# Patient Record
Sex: Female | Born: 1935
Health system: Southern US, Community
[De-identification: ages and names within clinical notes are randomized; demographics above are authoritative.]

## PROBLEM LIST (undated history)

## (undated) DIAGNOSIS — D62 Acute posthemorrhagic anemia: Secondary | ICD-10-CM

## (undated) DIAGNOSIS — M1990 Unspecified osteoarthritis, unspecified site: Secondary | ICD-10-CM

## (undated) DIAGNOSIS — K219 Gastro-esophageal reflux disease without esophagitis: Secondary | ICD-10-CM

## (undated) DIAGNOSIS — K922 Gastrointestinal hemorrhage, unspecified: Secondary | ICD-10-CM

## (undated) DIAGNOSIS — I10 Essential (primary) hypertension: Secondary | ICD-10-CM

## (undated) DIAGNOSIS — K5792 Diverticulitis of intestine, part unspecified, without perforation or abscess without bleeding: Secondary | ICD-10-CM

## (undated) HISTORY — DX: Acute posthemorrhagic anemia: D62

## (undated) HISTORY — DX: Unspecified osteoarthritis, unspecified site: M19.90

## (undated) HISTORY — DX: Essential (primary) hypertension: I10

## (undated) HISTORY — PX: ABDOMINAL HYSTERECTOMY: SHX81

## (undated) HISTORY — DX: Gastro-esophageal reflux disease without esophagitis: K21.9

---

## 2003-07-10 ENCOUNTER — Encounter: Admission: RE | Admit: 2003-07-10 | Discharge: 2003-07-10 | Payer: Self-pay

## 2004-11-11 ENCOUNTER — Encounter: Admission: RE | Admit: 2004-11-11 | Discharge: 2004-11-11 | Payer: Self-pay

## 2005-09-13 ENCOUNTER — Ambulatory Visit: Payer: Self-pay | Admitting: Gastroenterology

## 2005-09-13 ENCOUNTER — Inpatient Hospital Stay (HOSPITAL_COMMUNITY): Admission: EM | Admit: 2005-09-13 | Discharge: 2005-09-18 | Payer: Self-pay | Admitting: Emergency Medicine

## 2005-09-23 ENCOUNTER — Ambulatory Visit: Payer: Self-pay | Admitting: Gastroenterology

## 2005-10-21 ENCOUNTER — Other Ambulatory Visit: Admission: RE | Admit: 2005-10-21 | Discharge: 2005-10-21 | Payer: Self-pay | Admitting: *Deleted

## 2006-01-26 ENCOUNTER — Encounter: Admission: RE | Admit: 2006-01-26 | Discharge: 2006-01-26 | Payer: Self-pay | Admitting: *Deleted

## 2006-03-27 ENCOUNTER — Ambulatory Visit (HOSPITAL_COMMUNITY): Admission: RE | Admit: 2006-03-27 | Discharge: 2006-03-27 | Payer: Self-pay | Admitting: Gastroenterology

## 2006-04-10 ENCOUNTER — Ambulatory Visit: Payer: Self-pay | Admitting: Gastroenterology

## 2007-02-05 ENCOUNTER — Encounter: Admission: RE | Admit: 2007-02-05 | Discharge: 2007-02-05 | Payer: Self-pay | Admitting: *Deleted

## 2009-06-20 ENCOUNTER — Inpatient Hospital Stay (HOSPITAL_COMMUNITY): Admission: EM | Admit: 2009-06-20 | Discharge: 2009-06-26 | Payer: Self-pay | Admitting: Emergency Medicine

## 2009-07-05 ENCOUNTER — Ambulatory Visit (HOSPITAL_COMMUNITY): Admission: RE | Admit: 2009-07-05 | Discharge: 2009-07-05 | Payer: Self-pay | Admitting: General Surgery

## 2010-05-10 ENCOUNTER — Encounter: Admission: RE | Admit: 2010-05-10 | Discharge: 2010-05-10 | Payer: Self-pay | Admitting: Physician Assistant

## 2010-07-28 ENCOUNTER — Encounter: Payer: Self-pay | Admitting: General Surgery

## 2010-10-07 LAB — ANAEROBIC CULTURE

## 2010-10-07 LAB — CULTURE, ROUTINE-ABSCESS

## 2010-10-07 LAB — BASIC METABOLIC PANEL
BUN: 5 mg/dL — ABNORMAL LOW (ref 6–23)
Calcium: 9.2 mg/dL (ref 8.4–10.5)
Creatinine, Ser: 0.71 mg/dL (ref 0.4–1.2)
GFR calc non Af Amer: >60 mL/min (ref >60)
Glucose, Bld: 113 mg/dL — ABNORMAL HIGH (ref 70–99)

## 2010-10-07 LAB — CBC
Platelets: 358 10*3/uL (ref 150–400)
RDW: 14.8 % (ref 11.5–15.5)
WBC: 9.9 10*3/uL (ref 4.0–10.5)

## 2010-10-08 LAB — URINE MICROSCOPIC-ADD ON

## 2010-10-08 LAB — LIPASE, BLOOD: Lipase: 10 U/L — ABNORMAL LOW (ref 11–59)

## 2010-10-08 LAB — COMPREHENSIVE METABOLIC PANEL
BUN: 8 mg/dL (ref 6–23)
CO2: 30 mEq/L (ref 19–32)
Calcium: 10 mg/dL (ref 8.4–10.5)
Creatinine, Ser: 0.71 mg/dL (ref 0.4–1.2)
GFR calc non Af Amer: >60 mL/min (ref >60)
Glucose, Bld: 106 mg/dL — ABNORMAL HIGH (ref 70–99)
Total Protein: 7.3 g/dL (ref 6.0–8.3)

## 2010-10-08 LAB — URINE CULTURE

## 2010-10-08 LAB — POCT CARDIAC MARKERS
CKMB, poc: 3.1 ng/mL (ref 1.0–8.0)
Myoglobin, poc: 151 ng/mL (ref 12–200)
Myoglobin, poc: 174 ng/mL (ref 12–200)

## 2010-10-08 LAB — CBC
Hemoglobin: 12.3 g/dL (ref 12.0–15.0)
MCHC: 33.4 g/dL (ref 30.0–36.0)
MCV: 74.9 fL — ABNORMAL LOW (ref 78.0–100.0)
RBC: 4.91 MIL/uL (ref 3.87–5.11)
RDW: 15.1 % (ref 11.5–15.5)

## 2010-10-08 LAB — DIFFERENTIAL
Eosinophils Absolute: 0 10*3/uL (ref 0.0–0.7)
Lymphocytes Relative: 9 % — ABNORMAL LOW (ref 12–46)
Lymphs Abs: 1.2 10*3/uL (ref 0.7–4.0)
Monocytes Relative: 5 % (ref 3–12)
Neutro Abs: 11.8 10*3/uL — ABNORMAL HIGH (ref 1.7–7.7)
Neutrophils Relative %: 86 % — ABNORMAL HIGH (ref 43–77)

## 2010-10-08 LAB — URINALYSIS, ROUTINE W REFLEX MICROSCOPIC
Glucose, UA: NEGATIVE mg/dL
Nitrite: NEGATIVE
Protein, ur: 30 mg/dL — AB
pH: 6 (ref 5.0–8.0)

## 2010-10-08 LAB — PROTIME-INR: Prothrombin Time: 13.9 seconds (ref 11.6–15.2)

## 2010-10-08 LAB — APTT: aPTT: 34 s (ref 24–37)

## 2010-11-22 NOTE — Discharge Summary (Signed)
NAMEMarland Kitchen  Latoya Bennett, Latoya Bennett       ACCOUNT NO.:  [id number]   MEDICAL RECORD NO.:  [id number]          PATIENT TYPE:  INP   LOCATION:  4734                         FACILITY:  [facility]   PHYSICIAN:  Melissa L. Ladona Ridgel, MD  DATE OF BIRTH:  [birth year]   DATE OF ADMISSION:  09/13/2005  DATE OF DISCHARGE:  09/18/2005                                 DISCHARGE SUMMARY   ADDENDUM:  Please see previously dictated discharge summary by Dr. Jackie Plum and note the following addendum.   The patient continued to be monitored in the hospital because of the  potential for further GI bleeding.  Her hemoglobin remained stable at 10.1;  however, she developed the onset of acute tachycardia and hypertension and  therefore I requested that she stay for further monitoring.  Her Norvasc was  resumed and Lopressor was added at 25 mg p.o. twice daily.  A 2-D echo was  ordered and it was explained to the patient the reason for the test and why  it was important to obtain.  A TSH was also ordered and her potassium was  repleted.  A D-dimer was obtained and the thought was entertained at  obtaining a CTA of her chest if it were elevated.  On September 19, 2005, the  patient elected to -- against my advice -- leave the hospital, despite  explaining to her the need to further pursue her hypertension and her  tachycardia.  She stated that she could obtain assistance as an outpatient  and did not wish to further be tested.  Her sister was present during the  conversation and they attempted to explain to her the life-threatening  nature of her illness to which she said, 'I'll just return to the hospital  if there's a problem.  In an effort to maintain her health, I did provide  her with a prescription for Lopressor 25 mg twice daily and she already had  a prescription for Norvasc 5 mg once daily.  The patient was asked to sign  an against-medical-advice form, as I felt it was important to elucidate the  cause behind  her hypertension and tachycardia.      Melissa L. Ladona Ridgel, MD  Electronically Signed     MLT/MEDQ  D:  09/23/2005  T:  09/25/2005  Job:  [id number]

## 2010-11-22 NOTE — Discharge Summary (Signed)
NAMEMarland Bennett  ALTOVISE, WAHLER       ACCOUNT NO.:  [id number]   MEDICAL RECORD NO.:  [id number]          PATIENT TYPE:  INP   LOCATION:  3311                         FACILITY:  [facility]   PHYSICIAN:  Jackie Plum, M.D.DATE OF BIRTH:  1935/10/24   DATE OF ADMISSION:  09/13/2005  DATE OF DISCHARGE:  09/16/2005                                 DISCHARGE SUMMARY   DISCHARGE DIAGNOSES:  1.  Lower gastrointestinal bleed, status post colonoscopy today by Dr.      Christella Hartigan, which showed diverticula without any specific one seen to be      bleeding.  The cause of gastrointestinal bleed is presumed to be      secondary to the diverticular bleed, in view of the fact that this was      mainly painless bleeding and patient has history of previous      diverticular bleed.  2.  Anemia of acute blood loss, status post packed red blood cells      transfusion, stable hemoglobin.  3.  History of hypertension.  4.  Diverticulosis.   DISCHARGE MEDICATIONS:  To be clarified at the final time of discharge by  Dr. Derenda Mis.   CONDITION ON DISCHARGE:  Given at the time of final discharge by Dr. Ladona Ridgel.   CONSULTATIONS:  Anselmo Rod, M.D. and Rachael Fee, M.D. of GI  surgery.   PROCEDURE:  Colonoscopy, done on September 15, 2005.   REASON FOR ADMISSION:  Hematochezia.   Patient presented with painless hematochezia.  She had severe anemia of 6.2  on presentation.  She has had previous diverticular bleed about two years  ago.  On admission, the patient's BP was 105/50 with a pulse of 120.  She  was acutely pale and her cardiopulmonary auscultation was notable for  tachycardia without any gallops or murmur.  Abdomen is soft.  She is  therefore admitted for further evaluation to the telemetry bed and  monitoring.  The patient does not have any significant dysrhythmias.  She  was seen in consultation by Dr. Loreta Ave, who agreed that the patient's bleed  may be diverticular in origin; however, will  need to rule out any GI causes  of bleeding.  She had a colonoscopy done today which has shown the above  results, as noted above.  The patient __________ result.  Her hemoglobin has  been stabilized since __________ without any evidence of ongoing GI blood  loss.  She has not had any further hematochezia.  She does not have any  dizziness or any other symptoms at this time.  GI medicine has recommended  __________ over the next 24 hours, and she should be fine, can be discharged  home, otherwise, if she should have any evidence of ongoing blood loss, then  she will need nuclear study done at the time.  This dictation is therefore  being done in anticipation of discharge home tomorrow, when she will be seen  and followed by my colleague, Derenda Mis, M.D.   On rounds today, patient denied dizziness, shortness of breath, chest pain,  abdominal pain, nausea or vomiting.   PHYSICAL EXAMINATION:  CARDIOPULMONARY:  Her  cardiopulmonary auscultation  was unremarkable.  ABDOMEN:  Soft and nontender.  Bowel sounds are present.  EXTREMITIES:  No cyanosis.   Her labs indicate a WBC count of 5.8, hemoglobin 10.1, hematocrit 29.5, MCV  82.9, platelet count 184.  Patient's sodium is 146, potassium 3.6, chloride  105, CO2 27, glucose 108, BUN less than 1, creatinine 0.7.  Calcium is 8.9.   She is tolerating clears.  We are going to decrease her fluids and transfer  her to a telemetry bed for the next 24 hours for monitoring, as listed  above.      Jackie Plum, M.D.  Electronically Signed     GO/MEDQ  D:  09/15/2005  T:  09/15/2005  Job:  [id number]

## 2010-11-22 NOTE — Assessment & Plan Note (Signed)
Latoya Bennett                           Latoya Bennett   MCKENLEIGH, TARLTON                MRN:          [id number]  DATE:04/10/2006                            DOB:          [birth year]    PRIMARY CARE PHYSICIAN:  Dr. Dani Gobble at [facility].   GI PROBLEM LIST:  1. Recurrent lower gastrointestinal bleeding, likely diverticular.  Three      bleeds since 2003, [place].  She was told there      were diverticular bleeds in the past.  Most recent bleed was March      2007, painless hematochezia requiring one to two units of blood.      Colonoscopy performed by myself March 2007 showed multiple large      diverticula throughout the colon.  2. Acute sigmoid diverticulitis September 2007.  Presented with pain, mild      elevation of white count.  CT scan confirmed sigmoid diverticulitis.      Responded to oral antibiotics.   INTERVAL HISTORY:  I last saw Latoya Bennett seven to eight months ago.  She came to our office two weeks ago without an appointment and I was,  unfortunately, not available at that time.  Clinically, she had left lower  quadrant pain.  She was arranged to have a CT scan that day that confirmed  some sigmoid diverticulitis.  We prescribed her Cipro and Flagyl and she did  take this although sporadically.  She said she did complete most of the  antibiotic regimen.  For the past week-and-a-half to two weeks, she has felt  much better. Her pain is gone. She has had no fevers. Her bowels are back to  normal.   CURRENT MEDICATIONS:  Norvasc, Lopressor.   PHYSICAL EXAMINATION:  VITAL SIGNS:  Weight 155 pounds,  blood pressure  l50/82, pulse 88.  CONSTITUTIONAL:  Generally well-appearing.  CARDIOVASCULAR:  Regular rate and rhythm.  LUNGS:  Clear to auscultation bilaterally.  ABDOMEN:  Soft, nontender, nondistended.  Normal bowel sounds.   ASSESSMENT/PLAN:  A 75 year old woman with  recent diverticulitis, history of  diverticular bleeds.   I explained to Latoya Bennett that she has had several complications from  diverticular disease and that it is likely that she will continue to have  complications.  I recommended that she see a surgeon to discuss segmental  colectomy.  She is not very interested in that at this point.  Frankly, she  is downplaying her disease and she is not really sure that the antibiotics  made a difference.  She thinks just that modifying her diet during repeat  courses is probably all that she will need.  She, therefore, declines  surgical referral and we will instead just simply follow her clinically.  I  think she did agree that if she has another complication, she will  reconsider at that  point.  I see no reason for any further lab tests or imaging studies.            ______________________________  Rachael Fee, MD      DPJ/MedQ  DD:  04/10/2006  DT:  04/12/2006  Job #:  [id number]   cc:   Dr. Dani Gobble, [facility]

## 2010-11-22 NOTE — H&P (Signed)
NAMEMarland Kitchen  Latoya Bennett, Latoya Bennett       ACCOUNT NO.:  [id number]   MEDICAL RECORD NO.:  [id number]          PATIENT TYPE:  EMS   LOCATION:  MAJO                         FACILITY:  [facility]   PHYSICIAN:  Jackie Plum, M.D.DATE OF BIRTH:  Sep 18, 1935   DATE OF ADMISSION:  09/13/2005  DATE OF DISCHARGE:                                HISTORY & PHYSICAL   CHIEF COMPLAINT:  Hematochezia.   HISTORY OF PRESENT ILLNESS:  The patient presents with a 5-day history of  rectal bleeding which has been intermittent. She is a 75 year old lady with  history of hypertension and atherosclerotic disease, status post colonoscopy  after a prior GI bleed about 2 years ago at which time she was found to have  diverticular disease. She indicated that she is having worsening rectal  bleed with mild left lower quadrant abdominal discomfort. She had been  feeling dizzy upon standing. No chest pain, no shortness of breath. No PND,  orthopnea. No skin rash or bleeding. No mucosal membrane bleeding noted. No  difference in micturition. The patient is noted to be severely anemic. After  blood transfusion was started, we were asked to admit for admission.   REVIEW OF SYSTEMS:  As noted above. Cardiovascular - History is positive for  heart disease. The patient is on Norvasc 5 mg daily for blood pressure.  Remaining review of systems unremarkable.   ALLERGIES:  No known drug allergies.   SOCIAL HISTORY:  The patient originally moved to [place]. She does not  smoke cigarettes nor drink alcohol.   PHYSICAL EXAMINATION:  VITAL SIGNS:  Blood pressure was 105/50, pulse was up  to 120 earlier, temperature 98.2 degrees Fahrenheit. Respirations 20. Oxygen  saturation 97%.  GENERAL:  The patient was not acutely ill looking. She was very pale.  HEENT:  Pupils are equal, round and reactive to light. Extraocular movements  are intact. Oropharynx moist. No thrush.  NECK:  Supple, no jugular venous distension.  LUNGS:   Clear to auscultation.  CARDIAC:  The patient was slightly tachycardic without any gallops or  murmurs.  ABDOMEN:  Soft, there was no obvious left lower quadrant abdominal  tenderness appreciated.  EXTREMITIES:  No clubbing, cyanosis or edema.  CNS EXAM:  Nonfocal.   LABORATORY DATA:  White blood count 7600, hemoglobin 6.2, hematocrit 18.2,  MCV 38.5, platelet count 203,000. Sodium 140, calcium 3.3, chloride 110, CO2  28, glucose 137, BUN 5, creatinine 0.8, bilirubin 0.4, alkaline phosphatase  54, AST 20, ALT 14, total protein 4.6, albumin 2.6, calcium 8.2. Fecal  occult blood was positive.   IMPRESSION:  1.  Lower GI bleed, probably diverticular bleed.  2.  Anemia of acute blood loss.  3.  Hyperkalemia.  4.  Tachycardia.  5.  Significant bleeding.  6.  Hypertension.  7.  Diverticulosis disease.   PLAN:  The patient is admitted packed red blood cell transfusion and H&H  monitoring. She is started on GI prophylaxis. Dr. Loreta Ave has been consulted by  the emergency department physician for further management. The patient may  well need endoscopy during this admission.      Jackie Plum, M.D.  Electronically Signed  GO/MEDQ  D:  09/13/2005  T:  09/13/2005  Job:  [id number]

## 2014-11-24 DIAGNOSIS — M25562 Pain in left knee: Secondary | ICD-10-CM | POA: Diagnosis not present

## 2014-11-24 DIAGNOSIS — M25561 Pain in right knee: Secondary | ICD-10-CM | POA: Diagnosis not present

## 2014-12-05 DIAGNOSIS — M1712 Unilateral primary osteoarthritis, left knee: Secondary | ICD-10-CM | POA: Diagnosis not present

## 2015-03-15 DIAGNOSIS — M1712 Unilateral primary osteoarthritis, left knee: Secondary | ICD-10-CM | POA: Diagnosis not present

## 2015-03-22 DIAGNOSIS — M1712 Unilateral primary osteoarthritis, left knee: Secondary | ICD-10-CM | POA: Diagnosis not present

## 2015-03-29 DIAGNOSIS — M1712 Unilateral primary osteoarthritis, left knee: Secondary | ICD-10-CM | POA: Diagnosis not present

## 2015-06-06 ENCOUNTER — Ambulatory Visit (INDEPENDENT_AMBULATORY_CARE_PROVIDER_SITE_OTHER): Payer: Medicare Other | Admitting: Emergency Medicine

## 2015-06-06 VITALS — BP 154/80 | HR 86 | Temp 98.0°F | Resp 16 | Ht <= 58 in | Wt 132.4 lb

## 2015-06-06 DIAGNOSIS — R1013 Epigastric pain: Secondary | ICD-10-CM

## 2015-06-06 LAB — POCT CBC
Granulocyte percent: 51 %G (ref 37–80)
HCT, POC: 35.2 % — AB (ref 37.7–47.9)
HEMOGLOBIN: 11.6 g/dL — AB (ref 12.2–16.2)
Lymph, poc: 1.5 (ref 0.6–3.4)
MCH: 24.7 pg — AB (ref 27–31.2)
MCHC: 32.9 g/dL (ref 31.8–35.4)
MCV: 75.1 fL — AB (ref 80–97)
MID (cbc): 0.2 (ref 0–0.9)
MPV: 8.3 fL (ref 0–99.8)
PLATELET COUNT, POC: 145 10*3/uL (ref 142–424)
POC Granulocyte: 1.7 — AB (ref 2–6.9)
POC LYMPH PERCENT: 44.3 %L (ref 10–50)
POC MID %: 4.7 % (ref 0–12)
RBC: 4.68 M/uL (ref 4.04–5.48)
RDW, POC: 15.1 %
WBC: 3.3 10*3/uL — AB (ref 4.6–10.2)

## 2015-06-06 MED ORDER — LANSOPRAZOLE 30 MG PO CPDR: 30.0000 mg | DELAYED_RELEASE_CAPSULE | Freq: Every day | ORAL | Status: DC

## 2015-06-06 MED ORDER — SUCRALFATE 1 G PO TABS: ORAL_TABLET | ORAL | Status: DC

## 2015-06-06 NOTE — Progress Notes (Signed)
Subjective:  [name], female    DOB: 05-May-1936  Age: 79 y.o. MRN: [id number]  CC: Abdominal Pain and Other   HPI [facility] presents  patient is concerned that she has diverticulitis. She has a history of previous colostomy for diverticular disease. She has epigastric pain that radiates into her chest. Had some heartburn-like pain and associated with food intake. She denies drinking alcohol or smoking. She denies any medication that she's taking for treatment of this and the recurrence of symptoms been present for about a month or 6 weeks nausea vomiting or stool change. She has no blood in her stools or black stools no vomiting blood.  History Latoya Bennett has a past medical history of Arthritis.   She has past surgical history that includes Abdominal hysterectomy.   Her  family history includes Hypertension in her sister.  She   reports that she has never smoked. She does not have any smokeless tobacco history on file. Her alcohol and drug histories are not on file.  No outpatient prescriptions prior to visit.   No facility-administered medications prior to visit.    Social History   Social History  . Marital Status: Single    Spouse Name: N/A  . Number of Children: N/A  . Years of Education: N/A   Social History Main Topics  . Smoking status: Never Smoker   . Smokeless tobacco: None  . Alcohol Use: None  . Drug Use: None  . Sexual Activity: Not Asked   Other Topics Concern  . None   Social History Narrative  . None     Review of Systems  Constitutional: Negative for fever, chills and appetite change.  HENT: Negative for congestion, ear pain, postnasal drip, sinus pressure and sore throat.   Eyes: Negative for pain and redness.  Respiratory: Negative for cough, shortness of breath and wheezing.   Cardiovascular: Negative for leg swelling.  Gastrointestinal: Positive for abdominal pain (epigastric   worse with food).  Negative for nausea, vomiting, diarrhea, constipation and blood in stool.  Endocrine: Negative for polyuria.  Genitourinary: Negative for dysuria, urgency, frequency and flank pain.  Musculoskeletal: Negative for gait problem.  Skin: Negative for rash.  Neurological: Negative for weakness and headaches.  Psychiatric/Behavioral: Negative for confusion and decreased concentration. The patient is not nervous/anxious.     Objective:  BP 154/80 mmHg  Pulse 86  Temp(Src) 98 F (36.7 C) (Oral)  Resp 16  Ht 4' 0.75" (1.238 m)  Wt 132 lb 6.4 oz (60.056 kg)  BMI 39.18 kg/m2  Physical Exam  Constitutional: She is oriented to person, place, and time. She appears well-developed and well-nourished. No distress.  HENT:  Head: Normocephalic and atraumatic.  Right Ear: External ear normal.  Left Ear: External ear normal.  Nose: Nose normal.  Eyes: Conjunctivae and EOM are normal. Pupils are equal, round, and reactive to light. No scleral icterus.  Neck: Normal range of motion. Neck supple. No tracheal deviation present.  Cardiovascular: Normal rate, regular rhythm and normal heart sounds.   Pulmonary/Chest: Effort normal. No respiratory distress. She has no wheezes. She has no rales.  Abdominal: She exhibits no mass. There is tenderness in the epigastric area. There is no rebound and no guarding.  Musculoskeletal: She exhibits no edema.  Lymphadenopathy:    She has no cervical adenopathy.  Neurological: She is alert and oriented to person, place, and time. Coordination normal.  Skin: Skin is warm and dry. No rash noted.  Psychiatric: She  has a normal mood and affect. Her behavior is normal.      Assessment & Plan:   Latoya Bennett was seen today for abdominal pain and other.  Diagnoses and all orders for this visit:  Abdominal pain, epigastric -     POCT CBC -     H. pylori breath test  Other orders -     lansoprazole (PREVACID) 30 MG capsule; Take 1 capsule (30 mg total) by mouth  daily at 12 noon. -     sucralfate (CARAFATE) 1 G tablet; 1 tablet 1 hr ac and hs   I am having Ms. Stotesbury start on lansoprazole and sucralfate. I am also having her maintain her amLODipine and traMADol.  Meds ordered this encounter  Medications  . amLODipine (NORVASC) 10 MG tablet    Sig: Take 10 mg by mouth daily.  . traMADol (ULTRAM) 50 MG tablet    Sig: Take 50 mg by mouth every 6 (six) hours as needed.  . lansoprazole (PREVACID) 30 MG capsule    Sig: Take 1 capsule (30 mg total) by mouth daily at 12 noon.    Dispense:  30 capsule    Refill:  5  . sucralfate (CARAFATE) 1 G tablet    Sig: 1 tablet 1 hr ac and hs    Dispense:  120 tablet    Refill:  0    Appropriate red flag conditions were discussed with the patient as well as actions that should be taken.  Patient expressed his understanding.  Follow-up: Return if symptoms worsen or fail to improve.  Roselee Culver, MD   Results for orders placed or performed in visit on 06/06/15  POCT CBC  Result Value Ref Range   WBC 3.3 (A) 4.6 - 10.2 K/uL   Lymph, poc 1.5 0.6 - 3.4   POC LYMPH PERCENT 44.3 10 - 50 %L   MID (cbc) 0.2 0 - 0.9   POC MID % 4.7 0 - 12 %M   POC Granulocyte 1.7 (A) 2 - 6.9   Granulocyte percent 51.0 37 - 80 %G   RBC 4.68 4.04 - 5.48 M/uL   Hemoglobin 11.6 (A) 12.2 - 16.2 g/dL   HCT, POC 35.2 (A) 37.7 - 47.9 %   MCV 75.1 (A) 80 - 97 fL   MCH, POC 24.7 (A) 27 - 31.2 pg   MCHC 32.9 31.8 - 35.4 g/dL   RDW, POC 15.1 %   Platelet Count, POC 145 142 - 424 K/uL   MPV 8.3 0 - 99.8 fL

## 2015-06-06 NOTE — Patient Instructions (Signed)

## 2015-06-07 ENCOUNTER — Other Ambulatory Visit: Payer: Self-pay | Admitting: Emergency Medicine

## 2015-06-07 LAB — H. PYLORI BREATH TEST: H. pylori Breath Test: DETECTED — AB

## 2015-06-07 MED ORDER — AMOXICILL-CLARITHRO-LANSOPRAZ PO MISC: Freq: Two times a day (BID) | ORAL | Status: DC

## 2015-10-03 DIAGNOSIS — I10 Essential (primary) hypertension: Secondary | ICD-10-CM | POA: Diagnosis not present

## 2015-10-03 DIAGNOSIS — F418 Other specified anxiety disorders: Secondary | ICD-10-CM | POA: Diagnosis not present

## 2015-10-03 DIAGNOSIS — M1990 Unspecified osteoarthritis, unspecified site: Secondary | ICD-10-CM | POA: Diagnosis not present

## 2015-11-08 ENCOUNTER — Ambulatory Visit (INDEPENDENT_AMBULATORY_CARE_PROVIDER_SITE_OTHER): Payer: Medicare Other | Admitting: Family Medicine

## 2015-11-08 VITALS — BP 146/74 | HR 111 | Temp 97.8°F | Resp 18 | Ht 60.0 in | Wt 136.0 lb

## 2015-11-08 DIAGNOSIS — I10 Essential (primary) hypertension: Secondary | ICD-10-CM | POA: Diagnosis not present

## 2015-11-08 DIAGNOSIS — I499 Cardiac arrhythmia, unspecified: Secondary | ICD-10-CM

## 2015-11-08 DIAGNOSIS — M1712 Unilateral primary osteoarthritis, left knee: Secondary | ICD-10-CM

## 2015-11-08 DIAGNOSIS — R42 Dizziness and giddiness: Secondary | ICD-10-CM | POA: Diagnosis not present

## 2015-11-08 DIAGNOSIS — I491 Atrial premature depolarization: Secondary | ICD-10-CM | POA: Diagnosis not present

## 2015-11-08 DIAGNOSIS — M2550 Pain in unspecified joint: Secondary | ICD-10-CM

## 2015-11-08 LAB — POCT CBC
Granulocyte percent: 60.6 %G (ref 37–80)
HEMATOCRIT: 40.9 % (ref 37.7–47.9)
HEMOGLOBIN: 14.3 g/dL (ref 12.2–16.2)
LYMPH, POC: 1.7 (ref 0.6–3.4)
MCH, POC: 25.4 pg — AB (ref 27–31.2)
MCHC: 34.9 g/dL (ref 31.8–35.4)
MCV: 73 fL — AB (ref 80–97)
MID (cbc): 0.2 (ref 0–0.9)
MPV: 8.3 fL (ref 0–99.8)
POC GRANULOCYTE: 2.8 (ref 2–6.9)
POC LYMPH %: 35.9 % (ref 10–50)
POC MID %: 3.5 %M (ref 0–12)
Platelet Count, POC: 198 10*3/uL (ref 142–424)
RBC: 5.61 M/uL — AB (ref 4.04–5.48)
RDW, POC: 14.3 %
WBC: 4.7 10*3/uL (ref 4.6–10.2)

## 2015-11-08 LAB — POCT URINALYSIS DIP (MANUAL ENTRY)
BILIRUBIN UA: NEGATIVE
Glucose, UA: NEGATIVE
Ketones, POC UA: NEGATIVE
Leukocytes, UA: NEGATIVE
NITRITE UA: NEGATIVE
PH UA: 7
Protein Ur, POC: NEGATIVE
Spec Grav, UA: 1.015
UROBILINOGEN UA: 0.2

## 2015-11-08 LAB — COMPREHENSIVE METABOLIC PANEL
ALBUMIN: 4.4 g/dL (ref 3.6–5.1)
ALT: 9 U/L (ref 6–29)
AST: 15 U/L (ref 10–35)
Alkaline Phosphatase: 99 U/L (ref 33–130)
BUN: 13 mg/dL (ref 7–25)
CALCIUM: 10.4 mg/dL (ref 8.6–10.4)
CHLORIDE: 101 mmol/L (ref 98–110)
CO2: 30 mmol/L (ref 20–31)
Creat: 0.64 mg/dL (ref 0.60–0.93)
GLUCOSE: 95 mg/dL (ref 65–99)
POTASSIUM: 4 mmol/L (ref 3.5–5.3)
Sodium: 139 mmol/L (ref 135–146)
Total Bilirubin: 0.5 mg/dL (ref 0.2–1.2)
Total Protein: 7 g/dL (ref 6.1–8.1)

## 2015-11-08 LAB — GLUCOSE, POCT (MANUAL RESULT ENTRY): POC Glucose: 107 mg/dl — AB (ref 70–99)

## 2015-11-08 LAB — TSH: TSH: 2.24 mIU/L

## 2015-11-08 MED ORDER — CELECOXIB 200 MG PO CAPS: 200.0000 mg | ORAL_CAPSULE | Freq: Every day | ORAL | Status: DC

## 2015-11-08 NOTE — Progress Notes (Signed)
Subjective:    [name], female    DOB: Aug 27, 1935, 80 y.o.   MRN: [id number]  11/08/2015  Dizziness   HPI This 80 y.o. female presents for evaluation of dizziness.   Has been taking Tramadol for the past 3-4 years.  Got a refill of Tramadol and those made patient dizzy; pills looked different than normal Tramadol.  Takes Tramadol for osteoarthritis.  Has been sick for 2-3 weeks.  On cane; knees are bad.  Lives alone.  Requesting referral to rheumatology.  Has been in [place] for 12 years; had rheumatologist in [place].  Needs something for pain.  Requesting Celebrex which was recommended by a pharmacist.    Dizziness:  Onset three weeks ago on 10/08/15.  Started taking a different Tramadol pill; got them from [facility]; when started different color pill, became dizzy. Then called PCP and filled at mail order;has received 3 different Tramadol rx that were all different from original Tramadol.  Pharmacist recommended at [facility] recommended Celebrex.  Wants to get off of Tramadol.  Has not been evaluated by PCP since becoming dizzy. Has transferred to [facility]; has been with current primary care office for four years.  Sees a different provider each time.  Having dizziness every day.  Keeping a headache every day as well.  New Tramadol on 10-03-2015.  Last dose of Tramadol on 10-09-2015.  Now having a daily headache; still slightly lightheaded.    Blood pressure running not sure at home.  Taking Amlodipine daily. Also taking muscle relaxer from knee doctor.  Knee doctor is [place] [facility].   Needs a knee surgery; desires evaluation by rhuematology.    Review of Systems  Constitutional: Negative for fever, chills, diaphoresis and fatigue.  Eyes: Negative for visual disturbance.  Respiratory: Negative for cough and shortness of breath.   Cardiovascular: Positive for palpitations and leg swelling. Negative for chest pain.  Gastrointestinal: Negative for nausea, vomiting, abdominal  pain, diarrhea and constipation.  Endocrine: Negative for cold intolerance, heat intolerance, polydipsia, polyphagia and polyuria.  Musculoskeletal: Positive for arthralgias.  Neurological: Positive for dizziness and headaches. Negative for tremors, seizures, syncope, facial asymmetry, speech difficulty, weakness, light-headedness and numbness.    Past Medical History  Diagnosis Date  . Arthritis   . Hypertension   . GERD (gastroesophageal reflux disease)    Past Surgical History  Procedure Laterality Date  . Abdominal hysterectomy     No Known Allergies Current Outpatient Prescriptions  Medication Sig Dispense Refill  . amLODipine (NORVASC) 10 MG tablet Take 10 mg by mouth daily.    . traMADol (ULTRAM) 50 MG tablet Take 50 mg by mouth every 6 (six) hours as needed.    . celecoxib (CELEBREX) 200 MG capsule Take 1 capsule (200 mg total) by mouth daily. 30 capsule 2  . lansoprazole (PREVACID) 30 MG capsule Take 1 capsule (30 mg total) by mouth daily at 12 noon. (Patient not taking: Reported on 11/08/2015) 30 capsule 5   No current facility-administered medications for this visit.   Social History   Social History  . Marital Status: Single    Spouse Name: N/A  . Number of Children: N/A  . Years of Education: N/A   Occupational History  . Not on file.   Social History Main Topics  . Smoking status: Never Smoker   . Smokeless tobacco: Not on file  . Alcohol Use: Not on file  . Drug Use: Not on file  . Sexual Activity: Not on file  Other Topics Concern  . Not on file   Social History Narrative   Family History  Problem Relation Age of Onset  . Hypertension Sister        Objective:    BP 146/74 mmHg  Pulse 111  Temp(Src) 97.8 F (36.6 C)  Resp 18  Ht 5' (1.524 m)  Wt 136 lb (61.689 kg)  BMI 26.56 kg/m2  SpO2 99% Physical Exam  Constitutional: She is oriented to person, place, and time. She appears well-developed and well-nourished. No distress.  HENT:    Head: Normocephalic and atraumatic.  Right Ear: External ear normal.  Left Ear: External ear normal.  Nose: Nose normal.  Mouth/Throat: Oropharynx is clear and moist.  Eyes: Conjunctivae and EOM are normal. Pupils are equal, round, and reactive to light.  Neck: Normal range of motion. Neck supple. Carotid bruit is not present. No thyromegaly present.  Cardiovascular: Normal rate, normal heart sounds and intact distal pulses.  An irregularly irregular rhythm present. Exam reveals no gallop and no friction rub.   No murmur heard. 2+ edema BLE to lower ankles.  Pulmonary/Chest: Effort normal and breath sounds normal. She has no wheezes. She has no rales.  Abdominal: Soft. Bowel sounds are normal. She exhibits no distension and no mass. There is no tenderness. There is no rebound and no guarding.  Musculoskeletal: She exhibits edema.  Lymphadenopathy:    She has no cervical adenopathy.  Neurological: She is alert and oriented to person, place, and time. She has normal strength. No cranial nerve deficit or sensory deficit. She exhibits normal muscle tone. She displays a negative Romberg sign. Gait abnormal. Coordination normal.  Gait slowed due to lower extremity arthritis.  Skin: Skin is warm and dry. No rash noted. She is not diaphoretic. No erythema. No pallor.  Psychiatric: She has a normal mood and affect. Her behavior is normal.   Results for orders placed or performed in visit on 11/08/15  Comprehensive metabolic panel  Result Value Ref Range   Sodium 139 135 - 146 mmol/L   Potassium 4.0 3.5 - 5.3 mmol/L   Chloride 101 98 - 110 mmol/L   CO2 30 20 - 31 mmol/L   Glucose, Bld 95 65 - 99 mg/dL   BUN 13 7 - 25 mg/dL   Creat 0.64 0.60 - 0.93 mg/dL   Total Bilirubin 0.5 0.2 - 1.2 mg/dL   Alkaline Phosphatase 99 33 - 130 U/L   AST 15 10 - 35 U/L   ALT 9 6 - 29 U/L   Total Protein 7.0 6.1 - 8.1 g/dL   Albumin 4.4 3.6 - 5.1 g/dL   Calcium 10.4 8.6 - 10.4 mg/dL  TSH  Result Value Ref  Range   TSH 2.24 mIU/L  POCT urinalysis dipstick  Result Value Ref Range   Color, UA yellow yellow   Clarity, UA clear clear   Glucose, UA negative negative   Bilirubin, UA negative negative   Ketones, POC UA negative negative   Spec Grav, UA 1.015    Blood, UA small (A) negative   pH, UA 7.0    Protein Ur, POC negative negative   Urobilinogen, UA 0.2    Nitrite, UA Negative Negative   Leukocytes, UA Negative Negative  POCT CBC  Result Value Ref Range   WBC 4.7 4.6 - 10.2 K/uL   Lymph, poc 1.7 0.6 - 3.4   POC LYMPH PERCENT 35.9 10 - 50 %L   MID (cbc) 0.2 0 - 0.9  POC MID % 3.5 0 - 12 %M   POC Granulocyte 2.8 2 - 6.9   Granulocyte percent 60.6 37 - 80 %G   RBC 5.61 (A) 4.04 - 5.48 M/uL   Hemoglobin 14.3 12.2 - 16.2 g/dL   HCT, POC 40.9 37.7 - 47.9 %   MCV 73.0 (A) 80 - 97 fL   MCH, POC 25.4 (A) 27 - 31.2 pg   MCHC 34.9 31.8 - 35.4 g/dL   RDW, POC 14.3 %   Platelet Count, POC 198 142 - 424 K/uL   MPV 8.3 0 - 99.8 fL  POCT glucose (manual entry)  Result Value Ref Range   POC Glucose 107 (A) 70 - 99 mg/dl   EKG: NSR; +PVCs.    Assessment & Plan:   1. Dizziness   2. Cardiac arrhythmia, unspecified cardiac arrhythmia type   3. Essential hypertension   4. Primary osteoarthritis of left knee   5. Arthralgia   6. Premature atrial contractions    -New dizziness; normal neurological exam.  Do not feel that tramadol is causing ongoing dizziness as patient discontinued Tramadol several weeks ago.  Obtain labs to rule out secondary causes. If persists, will warrant MRI brain to rule out neurological etiology; will also need referral to neurology. -rx for Celebrex 200mg  daily to replace Tramadol. -also consider HOLDING muscle relaxer to see if dizziness secondary to muscle relaxer.   -refer to rheumatology per patient request; has orthopedist.  Orders Placed This Encounter  Procedures  . Comprehensive metabolic panel  . TSH  . Ambulatory referral to Rheumatology     Referral Priority:  Routine    Referral Type:  Consultation    Referral Reason:  Specialty Services Required    Requested Specialty:  Rheumatology    Number of Visits Requested:  1  . POCT urinalysis dipstick  . POCT CBC  . POCT glucose (manual entry)  . EKG 12-Lead   Meds ordered this encounter  Medications  . celecoxib (CELEBREX) 200 MG capsule    Sig: Take 1 capsule (200 mg total) by mouth daily.    Dispense:  30 capsule    Refill:  2    No Follow-up on file.    Jaelon Gatley Elayne Guerin, M.D. [facility] [address] [place], [place]  [address] [phone] phone [phone] fax

## 2015-11-08 NOTE — Patient Instructions (Signed)
     IF you received an x-ray today, you will receive an invoice from [facility]. Please contact [facility] at [phone] with questions or concerns regarding your invoice.   IF you received labwork today, you will receive an invoice from [facility]. Please contact [facility] at [phone] with questions or concerns regarding your invoice.   Our billing staff will not be able to assist you with questions regarding bills from these companies.  You will be contacted with the lab results as soon as they are available. The fastest way to get your results is to activate your My Chart account. Instructions are located on the last page of this paperwork. If you have not heard from us regarding the results in 2 weeks, please contact this office.      

## 2015-11-13 ENCOUNTER — Telehealth: Payer: Self-pay

## 2015-11-13 NOTE — Telephone Encounter (Signed)
Dr. Tamala Julian, I did not see if you wanted to prescribe something or not in your notes.

## 2015-11-13 NOTE — Telephone Encounter (Signed)
Call --- I prescribed Celebrex at visit per patient request.  She can also take Tylenol with Celebrex. Has she been taking Celebrex?

## 2015-11-13 NOTE — Telephone Encounter (Signed)
Pt wants Dr. Tamala Julian to let her know what kind of pain medicine she can take for her pain. She has really bad pain in her thighs & legs. Her PCP prescribed Tramadol and she can't take that medicine it makes her dizzy. Pt states that she spoke with Dr. Tamala Julian about this on her last visit here.

## 2015-11-14 NOTE — Telephone Encounter (Signed)
Advised pt Dr. Thompson Caul message.

## 2015-11-18 ENCOUNTER — Encounter: Payer: Self-pay | Admitting: Family Medicine

## 2015-12-07 DIAGNOSIS — M170 Bilateral primary osteoarthritis of knee: Secondary | ICD-10-CM | POA: Diagnosis not present

## 2015-12-07 DIAGNOSIS — M2550 Pain in unspecified joint: Secondary | ICD-10-CM | POA: Diagnosis not present

## 2015-12-14 DIAGNOSIS — M1712 Unilateral primary osteoarthritis, left knee: Secondary | ICD-10-CM | POA: Diagnosis not present

## 2015-12-14 DIAGNOSIS — M170 Bilateral primary osteoarthritis of knee: Secondary | ICD-10-CM | POA: Diagnosis not present

## 2015-12-14 DIAGNOSIS — M2550 Pain in unspecified joint: Secondary | ICD-10-CM | POA: Diagnosis not present

## 2016-02-22 ENCOUNTER — Telehealth: Payer: Self-pay

## 2016-02-22 DIAGNOSIS — M2550 Pain in unspecified joint: Secondary | ICD-10-CM

## 2016-02-22 NOTE — Telephone Encounter (Signed)
Patient request a refill of Celebrex 200 MG. [place].

## 2016-02-23 ENCOUNTER — Other Ambulatory Visit: Payer: Self-pay | Admitting: Family Medicine

## 2016-02-23 DIAGNOSIS — M2550 Pain in unspecified joint: Secondary | ICD-10-CM

## 2016-02-26 MED ORDER — CELECOXIB 200 MG PO CAPS: 200.0000 mg | ORAL_CAPSULE | Freq: Every day | ORAL | 0 refills | Status: DC

## 2016-02-26 NOTE — Telephone Encounter (Signed)
RFd celebrex and notified pt. She stated she is working on finding a PCP in the area, but has not been able to yet. Thanked [place] for [place] this to give her enough time to get into see a new provider.

## 2016-05-07 DIAGNOSIS — M1990 Unspecified osteoarthritis, unspecified site: Secondary | ICD-10-CM | POA: Diagnosis not present

## 2016-05-07 DIAGNOSIS — I10 Essential (primary) hypertension: Secondary | ICD-10-CM | POA: Diagnosis not present

## 2016-08-29 DIAGNOSIS — M170 Bilateral primary osteoarthritis of knee: Secondary | ICD-10-CM | POA: Diagnosis not present

## 2016-08-29 DIAGNOSIS — M19071 Primary osteoarthritis, right ankle and foot: Secondary | ICD-10-CM | POA: Diagnosis not present

## 2016-08-29 DIAGNOSIS — M2550 Pain in unspecified joint: Secondary | ICD-10-CM | POA: Diagnosis not present

## 2016-08-29 DIAGNOSIS — M25572 Pain in left ankle and joints of left foot: Secondary | ICD-10-CM | POA: Diagnosis not present

## 2016-08-29 DIAGNOSIS — M2140 Flat foot [pes planus] (acquired), unspecified foot: Secondary | ICD-10-CM | POA: Diagnosis not present

## 2016-08-29 DIAGNOSIS — M12872 Other specific arthropathies, not elsewhere classified, left ankle and foot: Secondary | ICD-10-CM | POA: Diagnosis not present

## 2016-09-18 ENCOUNTER — Ambulatory Visit: Payer: Medicare Other | Admitting: Podiatry

## 2016-10-29 ENCOUNTER — Ambulatory Visit (INDEPENDENT_AMBULATORY_CARE_PROVIDER_SITE_OTHER): Payer: Medicare Other | Admitting: Family Medicine

## 2016-10-29 ENCOUNTER — Ambulatory Visit (INDEPENDENT_AMBULATORY_CARE_PROVIDER_SITE_OTHER): Payer: Medicare Other

## 2016-10-29 ENCOUNTER — Encounter: Payer: Self-pay | Admitting: Family Medicine

## 2016-10-29 VITALS — BP 155/78 | HR 114 | Temp 97.7°F | Resp 18 | Ht 60.0 in | Wt 143.2 lb

## 2016-10-29 DIAGNOSIS — M25572 Pain in left ankle and joints of left foot: Secondary | ICD-10-CM

## 2016-10-29 DIAGNOSIS — M7989 Other specified soft tissue disorders: Secondary | ICD-10-CM

## 2016-10-29 DIAGNOSIS — I491 Atrial premature depolarization: Secondary | ICD-10-CM

## 2016-10-29 DIAGNOSIS — Z23 Encounter for immunization: Secondary | ICD-10-CM

## 2016-10-29 DIAGNOSIS — M1712 Unilateral primary osteoarthritis, left knee: Secondary | ICD-10-CM | POA: Diagnosis not present

## 2016-10-29 DIAGNOSIS — R000 Tachycardia, unspecified: Secondary | ICD-10-CM

## 2016-10-29 DIAGNOSIS — E2839 Other primary ovarian failure: Secondary | ICD-10-CM

## 2016-10-29 DIAGNOSIS — I10 Essential (primary) hypertension: Secondary | ICD-10-CM | POA: Diagnosis not present

## 2016-10-29 DIAGNOSIS — S8991XA Unspecified injury of right lower leg, initial encounter: Secondary | ICD-10-CM | POA: Diagnosis not present

## 2016-10-29 MED ORDER — CELECOXIB 200 MG PO CAPS: 200.0000 mg | ORAL_CAPSULE | Freq: Every day | ORAL | 1 refills | Status: DC

## 2016-10-29 MED ORDER — AMLODIPINE BESYLATE 10 MG PO TABS: 10.0000 mg | ORAL_TABLET | Freq: Every day | ORAL | 1 refills | Status: DC

## 2016-10-29 NOTE — Patient Instructions (Addendum)
See in you in  6 months for your follow-up.  If you have any questions please feel free to call.    IF you received an x-ray today, you will receive an invoice from [facility]. Please contact [facility] at [phone] with questions or concerns regarding your invoice.   IF you received labwork today, you will receive an invoice from [facility]. Please contact [facility] at [phone] with questions or concerns regarding your invoice.   Our billing staff will not be able to assist you with questions regarding bills from these companies.  You will be contacted with the lab results as soon as they are available. The fastest way to get your results is to activate your My Chart account. Instructions are located on the last page of this paperwork. If you have not heard from [place] regarding the results in 2 weeks, please contact this office.

## 2016-10-29 NOTE — Progress Notes (Signed)
Subjective:    [name], female    DOB: 05/14/1936, 81 y.o.   MRN: [id number]  10/29/2016  Knee Pain (Arthritis pain )   HPI This 81 y.o. female presents for evaluation of knee pain and hypertension.  Blood pressure goes up when tries to find new places.  [place] well.  Has a home in [facility]; does not have anyone to take care of cat.    s/p L ankle xray; having ankle swelling; prescribed presdnisone for that week; still having swelling.  Likely Celebrex.  Stopped taking Celebrex; swelling has improved; no longer having pain.  Only aches when stands up. Going to podiatrist to receive arch supports.  Considering knee replacement; only hurts when stands up.  Has received injections in knee.  Received injection in knee by Dossie Der.  Another rheumatologist provided steroid/prednisone.    Drained ankle over and over when younger. Interested in [place] because not familiar with [place].  Interested on [employer].  Last xray 03/2015 by Dr. Oneita Kras with [facility].    No recurrent dizziness.  Had urinary frequency with Prednisone.    No DOE unless working in the yard.  Denies chest pain, palpitations.  Will get anxious and heart races.    There is no immunization history on file for this patient. BP Readings from Last 3 Encounters:  10/29/16 (!) 155/78  11/08/15 (!) 146/74  06/06/15 (!) 154/80   Wt Readings from Last 3 Encounters:  10/29/16 143 lb 3.2 oz (65 kg)  11/08/15 136 lb (61.7 kg)  06/06/15 132 lb 6.4 oz (60.1 kg)    Review of Systems  Constitutional: Negative for chills, diaphoresis, fatigue and fever.  Eyes: Negative for visual disturbance.  Respiratory: Positive for shortness of breath. Negative for cough.   Cardiovascular: Negative for chest pain, palpitations and leg swelling.  Gastrointestinal: Negative for abdominal pain, constipation, diarrhea, nausea and vomiting.  Endocrine: Negative for cold intolerance, heat intolerance,  polydipsia, polyphagia and polyuria.  Genitourinary: Positive for frequency.  Musculoskeletal: Positive for joint swelling.  Neurological: Negative for dizziness, tremors, seizures, syncope, facial asymmetry, speech difficulty, weakness, light-headedness, numbness and headaches.  Psychiatric/Behavioral: The patient is nervous/anxious.     Past Medical History:  Diagnosis Date  . Arthritis   . GERD (gastroesophageal reflux disease)   . Hypertension    Past Surgical History:  Procedure Laterality Date  . ABDOMINAL HYSTERECTOMY     No Known Allergies  Social History   Social History  . Marital status: Single    Spouse name: N/A  . Number of children: N/A  . Years of education: N/A   Occupational History  . retired    Social History Main Topics  . Smoking status: Never Smoker  . Smokeless tobacco: Never Used  . Alcohol use Not on file  . Drug use: Unknown  . Sexual activity: Not on file   Other Topics Concern  . Not on file   Social History Narrative   Marital status: single; not dating in 2018; moved from [place] to be near sister; not happy in [place].  Lived [place] for 50 years.      Children: no children      Lives: alone with cat      Employment: retired; Optometrist with computers      Tobacco:       Alcohol:       ADLs; independent with ADLs; drives   Family History  Problem Relation Age of Onset  .  Hypertension Sister        Objective:    BP (!) 155/78   Pulse (!) 114   Temp 97.7 F (36.5 C) (Oral)   Resp 18   Ht 5' (1.524 m)   Wt 143 lb 3.2 oz (65 kg)   SpO2 97%   BMI 27.97 kg/m  Physical Exam  Constitutional: She is oriented to person, place, and time. She appears well-developed and well-nourished. No distress.  HENT:  Head: Normocephalic and atraumatic.  Right Ear: External ear normal.  Left Ear: External ear normal.  Nose: Nose normal.  Mouth/Throat: Oropharynx is clear and moist.  Eyes: Conjunctivae and EOM are normal. Pupils  are equal, round, and reactive to light.  Neck: Normal range of motion. Neck supple. Carotid bruit is not present. No thyromegaly present.  Cardiovascular: Normal rate, regular rhythm, normal heart sounds and intact distal pulses.  Exam reveals no gallop and no friction rub.   No murmur heard. Pulmonary/Chest: Effort normal and breath sounds normal. She has no wheezes. She has no rales.  Abdominal: Soft. Bowel sounds are normal. She exhibits no distension and no mass. There is no tenderness. There is no rebound and no guarding.  Lymphadenopathy:    She has no cervical adenopathy.  Neurological: She is alert and oriented to person, place, and time. No cranial nerve deficit.  Skin: Skin is warm and dry. No rash noted. She is not diaphoretic. No erythema. No pallor.  Psychiatric: She has a normal mood and affect. Her behavior is normal.   Depression screen [facility] 2/9 10/29/2016 11/08/2015 06/06/2015  Decreased Interest 0 0 0  Down, Depressed, Hopeless 0 0 0  PHQ - 2 Score 0 0 0   Fall Risk  10/29/2016 11/08/2015  Falls in the past year? Yes Yes  Number falls in past yr: 1 2 or more  Injury with Fall? No -      Assessment & Plan:   1. Essential hypertension   2. Primary osteoarthritis of left knee   3. Leg swelling   4. Acute left ankle pain   5. Tachycardia   6. Estrogen deficiency    -hypertension moderately controlled; obtain labs; refills provided. -LEFT knee OA; repeat xrays as no recent imaging; rx for Celebrex provided. -L ankle pain chronic.  Rx for Celebrex provided.   -recommend orthopedic referral in future to address Left knee pain and LEFT ankle pain/swelling.   Orders Placed This Encounter  Procedures  . DG Knee Complete 4 Views Left    Standing Status:   Future    Standing Expiration Date:   10/29/2017    Order Specific Question:   Reason for Exam (SYMPTOM  OR DIAGNOSIS REQUIRED)    Answer:   fall six months ago.  Known L OA    Order Specific Question:   Preferred  imaging location?    Answer:   External  . DG Bone Density    Standing Status:   Future    Standing Expiration Date:   12/29/2017    Order Specific Question:   Reason for Exam (SYMPTOM  OR DIAGNOSIS REQUIRED)    Answer:   estrogen deficiency    Order Specific Question:   Preferred imaging location?    Answer:   [facility]  . CBC with Differential/Platelet  . Comprehensive metabolic panel  . TSH  . POCT urinalysis dipstick  . EKG 12-Lead   Meds ordered this encounter  Medications  . DISCONTD: celecoxib (CELEBREX) 200 MG capsule  Sig: Take by mouth.  Marland Kitchen amLODipine (NORVASC) 10 MG tablet    Sig: Take 1 tablet (10 mg total) by mouth daily.    Dispense:  90 tablet    Refill:  1  . celecoxib (CELEBREX) 200 MG capsule    Sig: Take 1 capsule (200 mg total) by mouth daily.    Dispense:  90 capsule    Refill:  1    No Follow-up on file.   Kristi Elayne Guerin, M.D. Primary Care at [facility] [address] [place], [place]  [address] [phone] phone [phone] fax

## 2016-10-30 LAB — CBC WITH DIFFERENTIAL/PLATELET
BASOS ABS: 0 10*3/uL (ref 0.0–0.2)
Basos: 0 %
EOS (ABSOLUTE): 0 10*3/uL (ref 0.0–0.4)
Eos: 0 %
HEMOGLOBIN: 13.6 g/dL (ref 11.1–15.9)
Hematocrit: 40.5 % (ref 34.0–46.6)
IMMATURE GRANS (ABS): 0 10*3/uL (ref 0.0–0.1)
IMMATURE GRANULOCYTES: 0 %
LYMPHS: 28 %
Lymphocytes Absolute: 1.5 10*3/uL (ref 0.7–3.1)
MCH: 25 pg — ABNORMAL LOW (ref 26.6–33.0)
MCHC: 33.6 g/dL (ref 31.5–35.7)
MCV: 74 fL — ABNORMAL LOW (ref 79–97)
MONOCYTES: 4 %
Monocytes Absolute: 0.2 10*3/uL (ref 0.1–0.9)
NEUTROS ABS: 3.6 10*3/uL (ref 1.4–7.0)
Neutrophils: 68 %
Platelets: 189 10*3/uL (ref 150–379)
RBC: 5.45 x10E6/uL — AB (ref 3.77–5.28)
RDW: 14.8 % (ref 12.3–15.4)
WBC: 5.3 10*3/uL (ref 3.4–10.8)

## 2016-10-30 LAB — COMPREHENSIVE METABOLIC PANEL
ALBUMIN: 4.4 g/dL (ref 3.5–4.8)
ALT: 12 IU/L (ref 0–32)
AST: 16 IU/L (ref 0–40)
Albumin/Globulin Ratio: 1.6 (ref 1.2–2.2)
Alkaline Phosphatase: 106 IU/L (ref 39–117)
BUN / CREAT RATIO: 19 (ref 12–28)
BUN: 13 mg/dL (ref 8–27)
Bilirubin Total: 0.5 mg/dL (ref 0.0–1.2)
CHLORIDE: 99 mmol/L (ref 96–106)
CO2: 29 mmol/L (ref 18–29)
CREATININE: 0.69 mg/dL (ref 0.57–1.00)
Calcium: 10.5 mg/dL — ABNORMAL HIGH (ref 8.7–10.3)
GFR calc non Af Amer: 83 mL/min/{1.73_m2} (ref >59)
GFR, EST AFRICAN AMERICAN: 96 mL/min/{1.73_m2} (ref >59)
GLUCOSE: 104 mg/dL — AB (ref 65–99)
Globulin, Total: 2.8 g/dL (ref 1.5–4.5)
Potassium: 3.3 mmol/L — ABNORMAL LOW (ref 3.5–5.2)
Sodium: 143 mmol/L (ref 134–144)
TOTAL PROTEIN: 7.2 g/dL (ref 6.0–8.5)

## 2016-10-30 LAB — TSH: TSH: 2.48 u[IU]/mL (ref 0.450–4.500)

## 2016-11-24 DIAGNOSIS — I10 Essential (primary) hypertension: Secondary | ICD-10-CM | POA: Insufficient documentation

## 2016-11-24 DIAGNOSIS — I491 Atrial premature depolarization: Secondary | ICD-10-CM | POA: Insufficient documentation

## 2016-11-24 DIAGNOSIS — M1712 Unilateral primary osteoarthritis, left knee: Secondary | ICD-10-CM | POA: Insufficient documentation

## 2016-11-24 DIAGNOSIS — R000 Tachycardia, unspecified: Secondary | ICD-10-CM | POA: Insufficient documentation

## 2016-11-28 ENCOUNTER — Ambulatory Visit (INDEPENDENT_AMBULATORY_CARE_PROVIDER_SITE_OTHER): Payer: Medicare Other | Admitting: Family Medicine

## 2016-11-28 ENCOUNTER — Ambulatory Visit: Payer: Medicare Other | Admitting: Family Medicine

## 2016-11-28 ENCOUNTER — Encounter: Payer: Self-pay | Admitting: Family Medicine

## 2016-11-28 VITALS — BP 120/60 | HR 100 | Temp 97.9°F | Resp 18 | Ht 59.0 in | Wt 140.0 lb

## 2016-11-28 DIAGNOSIS — M25473 Effusion, unspecified ankle: Secondary | ICD-10-CM

## 2016-11-28 DIAGNOSIS — M1712 Unilateral primary osteoarthritis, left knee: Secondary | ICD-10-CM | POA: Diagnosis not present

## 2016-11-28 DIAGNOSIS — I10 Essential (primary) hypertension: Secondary | ICD-10-CM

## 2016-11-28 MED ORDER — MELOXICAM 7.5 MG PO TABS: 7.5000 mg | ORAL_TABLET | Freq: Every day | ORAL | 0 refills | Status: DC

## 2016-11-28 NOTE — Progress Notes (Signed)
By signing my name below, I, Latoya Bennett, attest that this documentation has been prepared under the direction and in the presence of Latoya Ray, MD.  Electronically Signed: Verlee Bennett, Medical Scribe. 11/28/16. 2:58 PM.  Subjective:    [name], female    DOB: [birth year], 81 y.o.   MRN: [id number]  HPI  Chief Complaint  Patient presents with  . Angioedema    Left Knee, Draw fluid out  . Medication Problem    Pt states celebrex causes the swelling  . Disucuss homecare    HPI Comments: Latoya Bennett is a 81 y.o. female who presents to Primary Care at [facility] complaining of left knee and ankle swelling onset 2-3 months. She was last seen by Dr. Tamala Bennett 10/29/16 at that time did note lower extremity swelling that improved with stoping celebrex. She has a history of left knee arthritis, and chronic left ankle pain. Pt had a left knee x-Bennett 10/29/16 that showed severe degenerative changes in all compartments, mainly medial compartment.  Reports feeling dizzy when she taking celebrex. Swelling has improved in the morning and worsens in the day. Pt has been taking 2 aleve and 2 tylenol a day for relief of her pain. Pt reports conflicting information stating she last went to her rheumatologist a couple of months ago for her left ankle and was given an injection in her knee. She also reports getting an x-Bennett on her lower ankle and knee at her rheumatologist 2 weeks ago and was given "calcium" for treatment. Pt has had a visco injection in her left knee about 7 years ago. Pt saw orthopedic, Dr. Alfonso Bennett, last year. Reports PMHx of diverticulitis, and can take advil and ASA. Denies recent injury, or PMHx CHF, or any heart disease.  Patient Active Problem List   Diagnosis Date Noted  . PAC (premature atrial contraction) 11/24/2016  . Essential hypertension 11/24/2016  . Primary osteoarthritis of left knee 11/24/2016  . Tachycardia 11/24/2016   Past Medical  History:  Diagnosis Date  . Arthritis   . GERD (gastroesophageal reflux disease)   . Hypertension    Past Surgical History:  Procedure Laterality Date  . ABDOMINAL HYSTERECTOMY     No Known Allergies Prior to Admission medications   Medication Sig Start Date End Date Taking? Authorizing Provider  amLODipine (NORVASC) 10 MG tablet Take 1 tablet (10 mg total) by mouth daily. 10/29/16  Yes Latoya Honour, MD  celecoxib (CELEBREX) 200 MG capsule Take 1 capsule (200 mg total) by mouth daily. 10/29/16  Yes Latoya Honour, MD   Social History   Social History  . Marital status: Single    Spouse name: N/A  . Number of children: N/A  . Years of education: N/A   Occupational History  . retired    Social History Main Topics  . Smoking status: Never Smoker  . Smokeless tobacco: Never Used  . Alcohol use Not on file  . Drug use: Unknown  . Sexual activity: Not on file   Other Topics Concern  . Not on file   Social History Narrative   Marital status: single; not dating in 2018; moved from [place] to be near sister; not happy in [place].  Lived [place] for 50 years.      Children: no children      Lives: alone with cat      Employment: retired; Optometrist with computers      Tobacco:       Alcohol:  ADLs; independent with ADLs; drives   Review of Systems  Cardiovascular: Positive for leg swelling.  Musculoskeletal: Positive for arthralgias.  Neurological: Positive for dizziness.   Objective:  Physical Exam  Constitutional: She appears well-developed and well-nourished. No distress.  HENT:  Head: Normocephalic and atraumatic.  Eyes: Conjunctivae are normal.  Neck: Neck supple.  Cardiovascular: Normal rate, regular rhythm and normal heart sounds.  Exam reveals no gallop and no friction rub.   No murmur heard. Pulmonary/Chest: Effort normal and breath sounds normal. No respiratory distress. She has no wheezes. She has no rales.  Musculoskeletal: She exhibits  edema.  Diffuse swelling of L>R ankle 1-2+ ankle edema bilaterally Tender at the lateral joint line Proximal fibula non tender Negative valgus Slight discomfort laterally with varus but no apparent laxity Able to flex to 90 degrees And over all has maintained extension compared to the other side Patellar non tender Minimal medial joint line tenderness Trace effusion  Neurological: She is alert.  Skin: Skin is warm, dry and intact. No erythema.  Skin intact without erythema  Psychiatric: She has a normal mood and affect. Her behavior is normal.  Nursing note and vitals reviewed.   Vitals:   11/28/16 1450  BP: 120/60  Pulse: 100  Resp: 18  Temp: 97.9 F (36.6 C)  TempSrc: Oral  SpO2: 100%  Weight: 140 lb (63.5 kg)  Height: 4\' 11"  (1.499 m)   Body mass index is 28.28 kg/m. Assessment & Plan:   Latoya Bennett is a 81 y.o. female Tricompartment osteoarthritis of left knee - Plan: meloxicam (MOBIC) 7.5 MG tablet, Ambulatory referral to Orthopedic Surgery  - Followed by rheumatology, fairly recent injection by her report. Request evaluation by orthopedics. Discussed severe tricompartmental arthritis and may recommend total knee replacement. Can discuss other injections such as viscous supplementation or corticosteroid injection with orthopedics. Did not inject her knee today as unknown last time he and may be too soon for repeat injection.  - Trial of meloxicam 7.5 mg daily. Advised to stop that medicine right away if the side effects or worsening swelling.  Essential hypertension  - Controlled, but discussed possible amlodipine effect on lower extremity edema. Option given to change that medication and add in separate agent, but she declined at this visit. Plans on follow-up with PCP in the next few weeks to discuss further.  Ankle swelling, unspecified laterality  - As above, may be in part to amlodipine. Advised to follow-up with rheumatology to discuss other treatment  options for ankle pain, but may improve with meloxicam. Did not repeat imaging as that apparently has been done a few weeks ago with rheumatology.  Meds ordered this encounter  Medications  . meloxicam (MOBIC) 7.5 MG tablet    Sig: Take 1 tablet (7.5 mg total) by mouth daily.    Dispense:  30 tablet    Refill:  0   Patient Instructions    I referred you to Dr. Alfonso Bennett to discuss options for your left knee pain. Possible injection would depend on when your last injection was provided at rheumatology. Please let them know that at your orthopedic visit.  For your ankle pain, I would recommend follow up with rheumatology to discuss other options. However the medication that I started you on today (meloxicam) may help both ankle and knee pain.   As we discussed you do have swelling in both ankles that may be related to amlodipine. We could try a lower dose of amlodipine with another small dose of  blood pressure medicine to keep blood pressure controlled. That may help with your lower extremity swelling, but you can follow-up with Dr. Tamala Bennett to discuss this plan further.   IF you received an x-Bennett today, you will receive an invoice from [facility]. Please contact [facility] at [phone] with questions or concerns regarding your invoice.   IF you received labwork today, you will receive an invoice from [place]. Please contact [facility] at [phone] with questions or concerns regarding your invoice.   Our billing staff will not be able to assist you with questions regarding bills from these companies.  You will be contacted with the lab results as soon as they are available. The fastest way to get your results is to activate your My Chart account. Instructions are located on the last page of this paperwork. If you have not heard from [place] regarding the results in 2 weeks, please contact this office.      I personally performed the services described in this documentation,  which was scribed in my presence. The recorded information has been reviewed and considered for accuracy and completeness, addended by me as needed, and agree with information above.  Signed,   Latoya Ray, MD [facility].  11/28/16 3:33 PM

## 2016-11-28 NOTE — Patient Instructions (Addendum)
  I referred you to Dr. Alfonso Ramus to discuss options for your left knee pain. Possible injection would depend on when your last injection was provided at rheumatology. Please let them know that at your orthopedic visit.  For your ankle pain, I would recommend follow up with rheumatology to discuss other options. However the medication that I started you on today (meloxicam) may help both ankle and knee pain.   As we discussed you do have swelling in both ankles that may be related to amlodipine. We could try a lower dose of amlodipine with another small dose of blood pressure medicine to keep blood pressure controlled. That may help with your lower extremity swelling, but you can follow-up with Dr. Tamala Julian to discuss this plan further.   IF you received an x-ray today, you will receive an invoice from [facility]. Please contact [facility] at [phone] with questions or concerns regarding your invoice.   IF you received labwork today, you will receive an invoice from [place]. Please contact [facility] at [phone] with questions or concerns regarding your invoice.   Our billing staff will not be able to assist you with questions regarding bills from these companies.  You will be contacted with the lab results as soon as they are available. The fastest way to get your results is to activate your My Chart account. Instructions are located on the last page of this paperwork. If you have not heard from [place] regarding the results in 2 weeks, please contact this office.

## 2016-12-23 ENCOUNTER — Telehealth: Payer: Self-pay | Admitting: Family Medicine

## 2016-12-23 NOTE — Telephone Encounter (Signed)
PATIENT STATES DR. Tamala Julian IS HER PCP BUT SHE SAW [name] LAST IN MAY FOR HER (L) KNEE. SHE SAID IT IS STILL VERY SWOLLEN AND SHE CAN HARDLY WALK. SHE WOULD LIKE TO KNOW IF SHE CAN MAYBE GET A SHOT OR HAVE A MRI TO SEE IF THIS IS ARTHRITIS AND IF SO HOW SEVERE? SHE SAID THE MELOXICAM THAT DR. GREENE PRESCRIBED HER TO HAVE IS HELPING SOME. BEST PHONE [phone] (CELL) [place]

## 2016-12-25 NOTE — Telephone Encounter (Signed)
Please advise 

## 2016-12-26 NOTE — Telephone Encounter (Signed)
When discussed at her visit with me, she had had fairly recent injection by her report. I referred her to orthopedics. I did discuss severe tricompartmental arthritis and that they may recommend total knee replacement. Also other injections such as viscosupplementation or corticosteroid injection could be discussed with orthopedics.  Has she received a call from orthopedics yet? If not, I would check into the status of that referral. If she feels her knee is more swollen, and does not have an upcoming appointment with orthopedics, could follow-up in office for possible aspiration of that knee, but I do not know if I would inject it with any medication at this point.

## 2016-12-29 NOTE — Telephone Encounter (Signed)
Left message to return message 

## 2016-12-31 NOTE — Telephone Encounter (Signed)
PT WOULD LIKE TO GO TO A RHEUMATOLOGIST SHE HAD A REFERRAL DONE ONCE AND NEVER WENT PLEASE RESPOND

## 2017-01-02 ENCOUNTER — Other Ambulatory Visit: Payer: Self-pay | Admitting: Family Medicine

## 2017-01-02 DIAGNOSIS — M1712 Unilateral primary osteoarthritis, left knee: Secondary | ICD-10-CM

## 2017-01-03 NOTE — Telephone Encounter (Signed)
I do not mind referral to rheumatology, but please see other questions from last visit. Orthopaedics may be her best route initially with osteoarthritis.  Has she seen orthopedics yet?  How is her knee?

## 2017-01-05 ENCOUNTER — Other Ambulatory Visit: Payer: Self-pay | Admitting: Family Medicine

## 2017-01-05 DIAGNOSIS — M1712 Unilateral primary osteoarthritis, left knee: Secondary | ICD-10-CM

## 2017-01-05 NOTE — Telephone Encounter (Signed)
Foot pain can be due to many different causes including something relatively benign like plantar fasciitis that tends to worsen on standing first thing in the morning. but it is hard to tell without an exam. We can see her here to determine if podiatry eval is needed.  On review of my last note, I was under the impression she was already followed by rheumatology. If she has a rheumatologist, she can call their office for appointment and I'm sure they can also evaluate her foot pain.

## 2017-01-05 NOTE — Telephone Encounter (Signed)
Pt says she has been to 2 or 3 orthopaedics.  Says she is not worried about the knee right now as much as she is about her arthritis.  She is having pain in her foot when she gets up.  It's not bad on the carpet, but on hard surfaces.  She didn't know if rheumatology was needed or podiatry.  Please call [phone] or [phone]

## 2017-01-05 NOTE — Telephone Encounter (Signed)
See Note below

## 2017-01-06 NOTE — Telephone Encounter (Signed)
Message was given to patient and she understood.

## 2017-01-12 NOTE — Telephone Encounter (Signed)
Meloxicam refilled once more, but would advise she discuss any further treatment with her specialist, either orthopedics or rheumatology.

## 2017-01-19 ENCOUNTER — Telehealth: Payer: Self-pay | Admitting: Family Medicine

## 2017-01-19 NOTE — Telephone Encounter (Signed)
Pt calling to see if Dr Tamala Julian can call her and tell her where or who can refer her to get a MRI on her feet she is having a hard time walking and uses crutches and a wheelchair she states that if Dr Tamala Julian cares she would call her back shes been having these issues for a while

## 2017-01-20 ENCOUNTER — Encounter: Payer: Medicare Other | Admitting: Family Medicine

## 2017-01-20 NOTE — Telephone Encounter (Signed)
Please advise 

## 2017-01-22 NOTE — Telephone Encounter (Signed)
Pt aware last time we will refill medication.

## 2017-01-23 NOTE — Telephone Encounter (Signed)
Spoke to patient, she is having heel, knee and feet pain. She is waiting for an appointment with [facility]alie[facility] Ip, I advised her to call to check on the referral. I looked up the number and address so she can call.

## 2017-01-23 NOTE — Telephone Encounter (Signed)
Call -- did patient see [facility] Ip orthopedics regarding her knee pain?  [name] referred her to Ralie[facility] Ip at her last visit with him.  Is she currently having FEET pain or is she having a difficulty time walking due to chronic knee pain with swelling?   If she is having new onset feet pain, I recommend evaluation.

## 2017-02-03 DIAGNOSIS — M1712 Unilateral primary osteoarthritis, left knee: Secondary | ICD-10-CM | POA: Diagnosis not present

## 2017-03-13 ENCOUNTER — Ambulatory Visit (INDEPENDENT_AMBULATORY_CARE_PROVIDER_SITE_OTHER): Payer: Medicare Other

## 2017-03-13 ENCOUNTER — Ambulatory Visit (INDEPENDENT_AMBULATORY_CARE_PROVIDER_SITE_OTHER): Payer: Medicare Other | Admitting: Emergency Medicine

## 2017-03-13 ENCOUNTER — Encounter: Payer: Self-pay | Admitting: Emergency Medicine

## 2017-03-13 VITALS — BP 142/64 | HR 110 | Temp 98.6°F | Resp 16 | Ht 59.5 in | Wt 137.8 lb

## 2017-03-13 DIAGNOSIS — M25572 Pain in left ankle and joints of left foot: Secondary | ICD-10-CM

## 2017-03-13 DIAGNOSIS — M25571 Pain in right ankle and joints of right foot: Secondary | ICD-10-CM

## 2017-03-13 DIAGNOSIS — G8929 Other chronic pain: Secondary | ICD-10-CM

## 2017-03-13 DIAGNOSIS — M19072 Primary osteoarthritis, left ankle and foot: Secondary | ICD-10-CM

## 2017-03-13 DIAGNOSIS — M19071 Primary osteoarthritis, right ankle and foot: Secondary | ICD-10-CM

## 2017-03-13 MED ORDER — DICLOFENAC SODIUM 75 MG PO TBEC: 75.0000 mg | DELAYED_RELEASE_TABLET | Freq: Two times a day (BID) | ORAL | 0 refills | Status: AC

## 2017-03-13 NOTE — Patient Instructions (Addendum)
   IF you received an x-ray today, you will receive an invoice from [facility]. Please contact [facility] at [phone] with questions or concerns regarding your invoice.   IF you received labwork today, you will receive an invoice from [facility]. Please contact [facility] at [phone] with questions or concerns regarding your invoice.   Our billing staff will not be able to assist you with questions regarding bills from these companies.  You will be contacted with the lab results as soon as they are available. The fastest way to get your results is to activate your My Chart account. Instructions are located on the last page of this paperwork. If you have not heard from us regarding the results in 2 weeks, please contact this office.     Ankle Pain Many things can cause ankle pain, including an injury to the area and overuse of the ankle.The ankle joint holds your body weight and allows you to move around. Ankle pain can occur on either side or the back of one ankle or both ankles. Ankle pain may be sharp and burning or dull and aching. There may be tenderness, stiffness, redness, or warmth around the ankle. Follow these instructions at home: Activity  Rest your ankle as told by your health care provider. Avoid any activities that cause ankle pain.  Do exercises as told by your health care provider.  Ask your health care provider if you can drive. Using a brace, a bandage, or crutches  If you were given a brace: ? Wear it as told by your health care provider. ? Remove it when you take a bath or a shower. ? Try not to move your ankle very much, but wiggle your toes from time to time. This helps to prevent swelling.  If you were given an elastic bandage: ? Remove it when you take a bath or a shower. ? Try not to move your ankle very much, but wiggle your toes from time to time. This helps to prevent swelling. ? Adjust the bandage to make it more comfortable if  it feels too tight. ? Loosen the bandage if you have numbness or tingling in your foot or if your foot turns cold and blue.  If you have crutches, use them as told by your health care provider. Continue to use them until you can walk without feeling pain in your ankle. Managing pain, stiffness, and swelling  Raise (elevate) your ankle above the level of your heart while you are sitting or lying down.  If directed, apply ice to the area: ? Put ice in a plastic bag. ? Place a towel between your skin and the bag. ? Leave the ice on for 20 minutes, 2-3 times per day. General instructions  Keep all follow-up visits as told by your health care provider. This is important.  Record this information that may be helpful for you and your health care provider: ? How often you have ankle pain. ? Where the pain is located. ? What the pain feels like.  Take over-the-counter and prescription medicines only as told by your health care provider. Contact a health care provider if:  Your pain gets worse.  Your pain is not relieved with medicines.  You have a fever or chills.  You are having more trouble with walking.  You have new symptoms. Get help right away if:  Your foot, leg, toes, or ankle tingles or becomes numb.  Your foot, leg, toes, or ankle becomes swollen.    Your foot, leg, toes, or ankle turns pale or blue. This information is not intended to replace advice given to you by your health care provider. Make sure you discuss any questions you have with your health care provider. Document Released: 12/11/2009 Document Revised: 02/22/2016 Document Reviewed: 01/23/2015 Elsevier Interactive Patient Education  2017 [employer].

## 2017-03-13 NOTE — Progress Notes (Signed)
[facility] 81 y.o.   Chief Complaint  Patient presents with  . Ankle Pain    X 5 MONTHS - per patient she thinks it is arthritis    HISTORY OF PRESENT ILLNESS: This is a 81 y.o. female complaining of pain to both ankles for months; has h/o arthritis of left knee.  Ankle Pain   The incident occurred more than 1 week ago. Incident location: no injuries. There was no injury mechanism. The pain is present in the left ankle and right ankle. The quality of the pain is described as aching. The pain is at a severity of 3/10. The pain is mild. The pain has been constant since onset. Pertinent negatives include no inability to bear weight, loss of motion, loss of sensation, muscle weakness, numbness or tingling. The symptoms are aggravated by weight bearing. She has tried nothing for the symptoms.     Prior to Admission medications   Medication Sig Start Date End Date Taking? Authorizing Provider  amLODipine (NORVASC) 10 MG tablet Take 1 tablet (10 mg total) by mouth daily. 10/29/16  Yes Wardell Honour, MD  meloxicam (MOBIC) 7.5 MG tablet TAKE 1 TABLET(7.5 MG) BY MOUTH DAILY 01/12/17  Yes Wendie Agreste, MD    No Known Allergies  Patient Active Problem List   Diagnosis Date Noted  . PAC (premature atrial contraction) 11/24/2016  . Essential hypertension 11/24/2016  . Primary osteoarthritis of left knee 11/24/2016  . Tachycardia 11/24/2016    Past Medical History:  Diagnosis Date  . Arthritis   . GERD (gastroesophageal reflux disease)   . Hypertension     Past Surgical History:  Procedure Laterality Date  . ABDOMINAL HYSTERECTOMY      Social History   Social History  . Marital status: Single    Spouse name: N/A  . Number of children: N/A  . Years of education: N/A   Occupational History  . retired    Social History Main Topics  . Smoking status: Never Smoker  . Smokeless tobacco: Never Used  . Alcohol use Not on file  . Drug use: Unknown  . Sexual  activity: Not on file   Other Topics Concern  . Not on file   Social History Narrative   Marital status: single; not dating in 2018; moved from [place] to be near sister; not happy in [place].  Lived [place] for 50 years.      Children: no children      Lives: alone with cat      Employment: retired; Optometrist with computers      Tobacco:       Alcohol:       ADLs; independent with ADLs; drives    Family History  Problem Relation Age of Onset  . Hypertension Sister      Review of Systems  Constitutional: Negative.  Negative for chills and fever.  Respiratory: Negative.  Negative for cough and shortness of breath.   Cardiovascular: Positive for leg swelling (ankle swelling bilaterally). Negative for chest pain and palpitations.  Gastrointestinal: Negative for abdominal pain, blood in stool, melena, nausea and vomiting.  Genitourinary: Negative for hematuria.  Musculoskeletal: Positive for joint pain.  Skin: Negative.  Negative for rash.  Neurological: Negative for dizziness, tingling, sensory change, focal weakness, loss of consciousness and numbness.  Endo/Heme/Allergies: Negative.   All other systems reviewed and are negative.   Vitals:   03/13/17 0818 03/13/17 0826  BP: (!) 153/75 (!) 142/64  Pulse: (!) 110  Resp: 16   Temp: 98.6 F (37 C)   SpO2: 98%     Physical Exam  Constitutional: She is oriented to person, place, and time. She appears well-developed and well-nourished.  HENT:  Head: Normocephalic and atraumatic.  Eyes: Pupils are equal, round, and reactive to light. EOM are normal.  Neck: Normal range of motion. Neck supple.  Cardiovascular: Normal rate and regular rhythm.   Pulmonary/Chest: Effort normal.  Abdominal: Soft. There is no tenderness.  Musculoskeletal:  Ankles: bilateral swelling with FROM; mild tenderness to palpation; NVI; no calf tenderness or swelling.  Neurological: She is alert and oriented to person, place, and time. No  sensory deficit. She exhibits normal muscle tone.  Skin: Skin is warm and dry. Capillary refill takes less than 2 seconds. No rash noted.  Psychiatric: She has a normal mood and affect. Her behavior is normal.  Vitals reviewed.  Dg Ankle Complete Left  Result Date: 03/13/2017 CLINICAL DATA:  Chronic left ankle pain and swelling. EXAM: LEFT ANKLE COMPLETE - 3+ VIEW COMPARISON:  Radiographs of August 29, 2016. FINDINGS: No definite fracture or dislocation is noted. Extensive osteophyte formation is seen involving the talotibial joint with probable loose bodies seen anterior to the joint. Stable subcutaneous soft tissue swelling is noted medially and laterally. IMPRESSION: Degenerative joint disease as described above. Stable subcutaneous soft tissue swelling is noted medially and laterally. No fracture or dislocation is noted. Electronically Signed   By: Marijo Conception, M.D.   On: 03/13/2017 09:23   Dg Ankle Complete Right  Result Date: 03/13/2017 CLINICAL DATA:  Chronic right ankle pain. EXAM: RIGHT ANKLE - COMPLETE 3+ VIEW COMPARISON:  Radiographs of August 29, 2016. FINDINGS: No acute fracture or dislocation is noted. Dorsal spurring of midfoot is noted suggesting degenerative change. No significant joint space narrowing is seen involving the talotibial joint. Subcutaneous soft tissue swelling is seen medially and laterally. Stable calcifications are seen inferior to the medial and lateral malleolus which may represent degenerative change or old avulsion fractures. IMPRESSION: Stable soft tissue swelling is noted medially and laterally. Probable mild degenerative changes are noted. No acute fracture or dislocation is noted. Electronically Signed   By: Marijo Conception, M.D.   On: 03/13/2017 09:25     ASSESSMENT & PLAN: Jasminemarie was seen today for ankle pain.  Diagnoses and all orders for this visit:  Chronic pain of both ankles -     DG Ankle Complete Left; Future -     DG Ankle Complete  Right; Future -     Ambulatory referral to Rheumatology  Osteoarthritis of both ankles, unspecified osteoarthritis type -     Ambulatory referral to Rheumatology  Other orders -     diclofenac (VOLTAREN) 75 MG EC tablet; Take 1 tablet (75 mg total) by mouth 2 (two) times daily.    Patient Instructions       IF you received an x-ray today, you will receive an invoice from [facility]. Please contact [facility] at [phone] with questions or concerns regarding your invoice.   IF you received labwork today, you will receive an invoice from [place]. Please contact [facility] at [phone] with questions or concerns regarding your invoice.   Our billing staff will not be able to assist you with questions regarding bills from these companies.  You will be contacted with the lab results as soon as they are available. The fastest way to get your results is to activate your My Chart account. Instructions are  located on the last page of this paperwork. If you have not heard from [place] regarding the results in 2 weeks, please contact this office.     Ankle Pain Many things can cause ankle pain, including an injury to the area and overuse of the ankle.The ankle joint holds your body weight and allows you to move around. Ankle pain can occur on either side or the back of one ankle or both ankles. Ankle pain may be sharp and burning or dull and aching. There may be tenderness, stiffness, redness, or warmth around the ankle. Follow these instructions at home: Activity  Rest your ankle as told by your health care provider. Avoid any activities that cause ankle pain.  Do exercises as told by your health care provider.  Ask your health care provider if you can drive. Using a brace, a bandage, or crutches  If you were given a brace: ? Wear it as told by your health care provider. ? Remove it when you take a bath or a shower. ? Try not to move your ankle very much, but  wiggle your toes from time to time. This helps to prevent swelling.  If you were given an elastic bandage: ? Remove it when you take a bath or a shower. ? Try not to move your ankle very much, but wiggle your toes from time to time. This helps to prevent swelling. ? Adjust the bandage to make it more comfortable if it feels too tight. ? Loosen the bandage if you have numbness or tingling in your foot or if your foot turns cold and blue.  If you have crutches, use them as told by your health care provider. Continue to use them until you can walk without feeling pain in your ankle. Managing pain, stiffness, and swelling  Raise (elevate) your ankle above the level of your heart while you are sitting or lying down.  If directed, apply ice to the area: ? Put ice in a plastic bag. ? Place a towel between your skin and the bag. ? Leave the ice on for 20 minutes, 2-3 times per day. General instructions  Keep all follow-up visits as told by your health care provider. This is important.  Record this information that may be helpful for you and your health care provider: ? How often you have ankle pain. ? Where the pain is located. ? What the pain feels like.  Take over-the-counter and prescription medicines only as told by your health care provider. Contact a health care provider if:  Your pain gets worse.  Your pain is not relieved with medicines.  You have a fever or chills.  You are having more trouble with walking.  You have new symptoms. Get help right away if:  Your foot, leg, toes, or ankle tingles or becomes numb.  Your foot, leg, toes, or ankle becomes swollen.  Your foot, leg, toes, or ankle turns pale or blue. This information is not intended to replace advice given to you by your health care provider. Make sure you discuss any questions you have with your health care provider. Document Released: 12/11/2009 Document Revised: 02/22/2016 Document Reviewed:  01/23/2015 Elsevier Interactive Patient Education  2017 Elsevier Inc.      Agustina Caroli, MD [facility]

## 2017-03-27 ENCOUNTER — Telehealth: Payer: Self-pay | Admitting: Family Medicine

## 2017-03-27 NOTE — Telephone Encounter (Signed)
PT ALSO STATES THAT SHE HAD TOOK TWO OF THE MOBIC TABLETS AND SHE HAD SOME RECTAL BLEEDING SHE STATES THAT IT WAS PROBABLY A SIDE EFFECT

## 2017-03-30 NOTE — Telephone Encounter (Signed)
See previous note need more information on the rectal bleeding.

## 2017-04-01 NOTE — Telephone Encounter (Signed)
Pt states her arthritis is worsening. Stated that she took mobic for 2 days and had dark stools bloody stools. Pt stated this issue resolved 2 days after taking. I recommended OV for these issues. Pt does not want to come in for this and is only requesting that Dr. Tamala Julian order an MRI of her legs. I told the pt that Tamala Julian may not be able to do this without further eval but she wanted to see what I can find out.

## 2017-04-03 NOTE — Telephone Encounter (Signed)
She should be evaluated by Rheumatologist before deciding on MRI.

## 2017-04-03 NOTE — Telephone Encounter (Signed)
Patient evaluated by [name] on 03/13/17 and referred to rheumatology.  Will forward message to [name] to determine next best step.

## 2017-04-06 ENCOUNTER — Telehealth: Payer: Self-pay | Admitting: Family Medicine

## 2017-04-06 NOTE — Telephone Encounter (Signed)
Spoke with pt and advised her that referral has been sent to [facility] on 10/1

## 2017-04-09 DIAGNOSIS — M25473 Effusion, unspecified ankle: Secondary | ICD-10-CM | POA: Diagnosis not present

## 2017-04-09 DIAGNOSIS — M170 Bilateral primary osteoarthritis of knee: Secondary | ICD-10-CM | POA: Diagnosis not present

## 2017-04-09 DIAGNOSIS — M1712 Unilateral primary osteoarthritis, left knee: Secondary | ICD-10-CM | POA: Diagnosis not present

## 2017-04-09 DIAGNOSIS — M2140 Flat foot [pes planus] (acquired), unspecified foot: Secondary | ICD-10-CM | POA: Diagnosis not present

## 2017-04-09 DIAGNOSIS — M2550 Pain in unspecified joint: Secondary | ICD-10-CM | POA: Diagnosis not present

## 2017-04-09 DIAGNOSIS — M25572 Pain in left ankle and joints of left foot: Secondary | ICD-10-CM | POA: Diagnosis not present

## 2017-04-10 DIAGNOSIS — M170 Bilateral primary osteoarthritis of knee: Secondary | ICD-10-CM | POA: Diagnosis not present

## 2017-04-10 NOTE — Telephone Encounter (Signed)
Left VM to call back 

## 2017-04-14 ENCOUNTER — Ambulatory Visit: Payer: Medicare Other | Admitting: Emergency Medicine

## 2017-04-15 ENCOUNTER — Encounter: Payer: Medicare Other | Admitting: Emergency Medicine

## 2017-04-16 ENCOUNTER — Telehealth: Payer: Self-pay | Admitting: Emergency Medicine

## 2017-04-16 NOTE — Telephone Encounter (Signed)
Pt is calling to get lab results.  Please advise when ready

## 2017-04-17 ENCOUNTER — Ambulatory Visit: Payer: Medicare Other | Admitting: Emergency Medicine

## 2017-04-22 NOTE — Telephone Encounter (Signed)
Pt has not had labs since 10/2016.

## 2017-04-30 DIAGNOSIS — M1990 Unspecified osteoarthritis, unspecified site: Secondary | ICD-10-CM | POA: Diagnosis not present

## 2017-04-30 DIAGNOSIS — E876 Hypokalemia: Secondary | ICD-10-CM | POA: Diagnosis not present

## 2017-04-30 DIAGNOSIS — I10 Essential (primary) hypertension: Secondary | ICD-10-CM | POA: Diagnosis not present

## 2017-05-20 ENCOUNTER — Telehealth: Payer: Self-pay

## 2017-05-20 NOTE — Telephone Encounter (Signed)
Called patient to schedule AWV with nurse health advisor. Last AWV was in 2011. Patient says she isn't feeling well today and reports that she is having side effects from her medication. She said she is considering going to the ED at [facility] on Monday, 05/25/17. I offered to schedule her an appointment with her PCP, [name] and she accepted. Patient says she is concerned because she wants someone to tell her that "she is OK" and that there is "nothing wrong with her". She reported that she doesn't like the side effects of a medication she has recently started. She also expressed that she wants to have some more testing done besides lab work. She reports she wanted to go to the hospital to have an ultrasound.   Scheduled visit with PCP on Friday and encouraged patient to call back if she needed anything else. Patient said she would schedule AWV when she was feeling better.    Latoya Bennett, B.A.  Care Guide - [facility] [place]

## 2017-05-22 ENCOUNTER — Ambulatory Visit: Payer: Medicare Other | Admitting: Emergency Medicine

## 2017-05-26 ENCOUNTER — Encounter (HOSPITAL_COMMUNITY): Payer: Self-pay | Admitting: Emergency Medicine

## 2017-05-26 ENCOUNTER — Emergency Department (HOSPITAL_COMMUNITY)
Admission: EM | Admit: 2017-05-26 | Discharge: 2017-05-26 | Disposition: A | Discharge status: Home / Self Care | Payer: Medicare Other | Attending: Emergency Medicine | Admitting: Emergency Medicine

## 2017-05-26 ENCOUNTER — Emergency Department (HOSPITAL_COMMUNITY): Payer: Medicare Other

## 2017-05-26 DIAGNOSIS — I10 Essential (primary) hypertension: Secondary | ICD-10-CM | POA: Insufficient documentation

## 2017-05-26 DIAGNOSIS — R51 Headache: Secondary | ICD-10-CM | POA: Diagnosis not present

## 2017-05-26 DIAGNOSIS — Z79899 Other long term (current) drug therapy: Secondary | ICD-10-CM | POA: Diagnosis not present

## 2017-05-26 DIAGNOSIS — R519 Headache, unspecified: Secondary | ICD-10-CM

## 2017-05-26 MED ORDER — METOCLOPRAMIDE HCL 5 MG/ML IJ SOLN
10.0000 mg | Freq: Once | INTRAMUSCULAR | Status: AC
  Administered 2017-05-26: 10 mg via INTRAVENOUS
  Filled 2017-05-26: qty 2

## 2017-05-26 MED ORDER — SODIUM CHLORIDE 0.9 % IV BOLUS (SEPSIS)
1000.0000 mL | Freq: Once | INTRAVENOUS | Status: AC
  Administered 2017-05-26: 1000 mL via INTRAVENOUS

## 2017-05-26 NOTE — ED Provider Notes (Signed)
[facility] DEPT Provider Note   CSN: [id number] Arrival date & time: 05/26/17  1914     History   Chief Complaint Chief Complaint  Patient presents with  . Headache    HPI Latoya Bennett is a 81 y.o. female with past medical history of chronic arthritis, hypertension, GERD, presenting to the ED with sided headache times 1 month.  Patient states headache initially was intermittent, however became constant and throbbing.  Reports associated photophobia and nausea.  States she was recently started on diclofenac for arthritis, however thinks she is experiencing all the side effects as noted on the printout given by the pharmacist.  Denies history of headache, denies vision changes, CP, SOB, or numbness or weakness, slurred speech.  The history is provided by the patient.    Past Medical History:  Diagnosis Date  . Arthritis   . GERD (gastroesophageal reflux disease)   . Hypertension     Patient Active Problem List   Diagnosis Date Noted  . Chronic pain of both ankles 03/13/2017  . Osteoarthritis of both ankles 03/13/2017  . PAC (premature atrial contraction) 11/24/2016  . Essential hypertension 11/24/2016  . Primary osteoarthritis of left knee 11/24/2016  . Tachycardia 11/24/2016    Past Surgical History:  Procedure Laterality Date  . ABDOMINAL HYSTERECTOMY      OB History    No data available       Home Medications    Prior to Admission medications   Medication Sig Start Date End Date Taking? Authorizing Provider  amLODipine (NORVASC) 10 MG tablet Take 1 tablet (10 mg total) by mouth daily. 10/29/16   Wardell Honour, MD  meloxicam (MOBIC) 7.5 MG tablet TAKE 1 TABLET(7.5 MG) BY MOUTH DAILY 01/12/17   Wendie Agreste, MD    Family History Family History  Problem Relation Age of Onset  . Hypertension Sister     Social History Social History   Tobacco Use  . Smoking status: Never Smoker  . Smokeless tobacco: Never  Used  Substance Use Topics  . Alcohol use: Not on file  . Drug use: Not on file     Allergies   Patient has no known allergies.   Review of Systems Review of Systems  Constitutional: Negative for fever.  Eyes: Positive for photophobia. Negative for visual disturbance.  Respiratory: Negative for shortness of breath.   Cardiovascular: Negative for chest pain.  Gastrointestinal: Positive for nausea.  Musculoskeletal: Negative for neck pain and neck stiffness.  Neurological: Positive for headaches. Negative for syncope, facial asymmetry, speech difficulty, weakness and numbness.  All other systems reviewed and are negative.    Physical Exam Updated Vital Signs BP (!) 151/99 (BP Location: Left Arm)   Pulse (!) 120   Temp 97.8 F (36.6 C) (Oral)   Resp 18   Ht 4\' 9"  (1.448 m)   Wt 62.6 kg (138 lb)   SpO2 97%   BMI 29.86 kg/m   Physical Exam  Constitutional: She is oriented to person, place, and time. She appears well-developed and well-nourished. She does not appear ill. No distress.  HENT:  Head: Normocephalic and atraumatic.  Eyes: Conjunctivae and EOM are normal. Pupils are equal, round, and reactive to light.  Neck: Normal range of motion. Neck supple. No neck rigidity.  Cardiovascular: Normal rate, regular rhythm, normal heart sounds and intact distal pulses.  Pulmonary/Chest: Effort normal and breath sounds normal.  Abdominal: Soft. Bowel sounds are normal.  Neurological: She is alert and oriented  to person, place, and time.  Mental Status:  Alert, oriented, thought content appropriate, able to give a coherent history. Speech fluent without evidence of aphasia. Able to follow 2 step commands without difficulty.  Cranial Nerves:  II:  Peripheral visual fields grossly normal, pupils equal, round, reactive to light III,IV, VI: ptosis not present, extra-ocular motions intact bilaterally  V,VII: smile symmetric, facial light touch sensation equal VIII: hearing grossly  normal to voice  X: uvula elevates symmetrically  XI: bilateral shoulder shrug symmetric and strong XII: midline tongue extension without fassiculations Motor:  Normal tone. 5/5 in upper and lower extremities bilaterally including strong and equal grip strength and dorsiflexion/plantar flexion Sensory: Pinprick and light touch normal in all extremities.  Deep Tendon Reflexes: 2+ and symmetric in the biceps and patella Cerebellar: normal finger-to-nose with bilateral upper extremities Gait: normal gait and balance CV: distal pulses palpable throughout    Skin: Skin is warm.  Psychiatric: She has a normal mood and affect. Her behavior is normal.  Nursing note and vitals reviewed.    ED Treatments / Results  Labs (all labs ordered are listed, but only abnormal results are displayed) Labs Reviewed - No data to display  EKG  EKG Interpretation None       Radiology Ct Head Wo Contrast  Result Date: 05/26/2017 CLINICAL DATA:  Severe headache in the left occipital region for more than a month, some left arm weakness and numbness, left blurred vision EXAM: CT HEAD WITHOUT CONTRAST TECHNIQUE: Contiguous axial images were obtained from the base of the skull through the vertex without intravenous contrast. COMPARISON:  None. FINDINGS: Brain: The ventricular system is normal in size and configuration, and the septum is midline in position. Only mild cortical atrophy is present for age. No hemorrhage, mass lesion, or acute infarction is seen. Faint bilateral basal ganglial calcifications are noted. Vascular: No vascular abnormality is seen on this unenhanced study. Skull: On bone window images, no calvarial abnormality is seen. Sinuses/Orbits: The visualized paranasal sinuses are well pneumatized. Other: None IMPRESSION: 1. No acute intracranial abnormality. 2. Only mild cortical atrophy is present for age. Electronically Signed   By: Ivar Drape M.D.   On: 05/26/2017 10:25     Procedures Procedures (including critical care time)  Medications Ordered in ED Medications  metoCLOPramide (REGLAN) injection 10 mg (10 mg Intravenous Given 05/26/17 1002)  sodium chloride 0.9 % bolus 1,000 mL (1,000 mLs Intravenous New Bag/Given 05/26/17 1001)     Initial Impression / Assessment and Plan / ED Course  I have reviewed the triage vital signs and the nursing notes.  Pertinent labs & imaging results that were available during my care of the patient were reviewed by me and considered in my medical decision making (see chart for details).    Pt presenting with 1 month of left sided headache, with photophobia and nausea. No vision changes, no focal neuro deficits on exam. CT head neg for acute pathology. HA treated with resolution of symptoms with reglan and IVF. Recommend pt discontinue diclofenac if she is not tolerating the medication, and to follow up with the provider that prescribed this medication. Pt is well-appearing, safe for discharge.  Patient discussed with and seen by Dr. Venora Maples.  Discussed results, findings, treatment and follow up. Patient advised of return precautions. Patient verbalized understanding and agreed with plan.   Final Clinical Impressions(s) / ED Diagnoses   Final diagnoses:  Bad headache    ED Discharge Orders    None  Robinson, [place] N, PA-C 05/26/17 [place], Kevin, MD 05/27/17 [phone]

## 2017-05-26 NOTE — ED Notes (Signed)
Bed: WA06 Expected date:  Expected time:  Means of arrival:  Comments: 

## 2017-05-26 NOTE — ED Notes (Signed)
Patient transported to CT 

## 2017-05-26 NOTE — Discharge Instructions (Signed)
Please read instructions below. °You can take 600 mg of Advil/ibuprofen every 6 hours as needed for headache. °Schedule an appointment with your primary care provider to follow up on your headache and discuss preventative treatment. °Return to the ER for severely worsening headache, vision changes, fever, weakness or numbness, or new or concerning symptoms. ° °

## 2017-05-26 NOTE — ED Triage Notes (Signed)
Patient c/o of constant headache that she has had on left side for month. Patient reports that she gone to doctor but only done blood work on her. Patient reports nausea but denies vomiting.

## 2017-07-09 ENCOUNTER — Telehealth: Payer: Self-pay | Admitting: Emergency Medicine

## 2017-07-09 NOTE — Telephone Encounter (Signed)
Copied from [place] [phone]. Topic: Medical Record Request - Patient ROI Request >> Jul 09, 2017  2:19 PM Corie Chiquito, [place] wrote: Patient Name/DOB/MRN #: Latoya Bennett/1935/12/08/[id number] Requestor Name/Agency: Babs Bertin Call Back #: [phone] Information Requested: Patient would like all of her medical records sent over to the [facility]. Patient stated that she has been waiting for 2 weeks for the records and still has not received them. I tried to connect her to the Medical records but our call was dropped. I have tried also to contact her back but I'm not getting an answer. If someone at the office could try to contact her as well at [phone]   Route to [facility] for [place] clinics. For all other clinics, route to the clinic's PEC Pool.

## 2017-07-09 NOTE — Telephone Encounter (Signed)
Please advise 

## 2017-07-10 NOTE — Telephone Encounter (Signed)
I called pt and let her know we faxed her medical records to [facility] on 06/01/17.

## 2017-07-25 ENCOUNTER — Other Ambulatory Visit: Payer: Self-pay | Admitting: Family Medicine

## 2017-10-02 DIAGNOSIS — M1712 Unilateral primary osteoarthritis, left knee: Secondary | ICD-10-CM | POA: Diagnosis not present

## 2017-11-13 DIAGNOSIS — Z8639 Personal history of other endocrine, nutritional and metabolic disease: Secondary | ICD-10-CM | POA: Diagnosis not present

## 2017-11-13 DIAGNOSIS — M1990 Unspecified osteoarthritis, unspecified site: Secondary | ICD-10-CM | POA: Diagnosis not present

## 2017-11-13 DIAGNOSIS — M25512 Pain in left shoulder: Secondary | ICD-10-CM | POA: Diagnosis not present

## 2017-11-13 DIAGNOSIS — I10 Essential (primary) hypertension: Secondary | ICD-10-CM | POA: Diagnosis not present

## 2017-11-24 DIAGNOSIS — G4489 Other headache syndrome: Secondary | ICD-10-CM | POA: Diagnosis not present

## 2017-11-24 DIAGNOSIS — I10 Essential (primary) hypertension: Secondary | ICD-10-CM | POA: Diagnosis not present

## 2017-12-02 ENCOUNTER — Encounter: Payer: Self-pay | Admitting: Family Medicine

## 2017-12-12 ENCOUNTER — Other Ambulatory Visit: Payer: Self-pay | Admitting: Family Medicine

## 2017-12-14 ENCOUNTER — Other Ambulatory Visit: Payer: Self-pay | Admitting: Emergency Medicine

## 2017-12-14 NOTE — Telephone Encounter (Signed)
amlodipine refill Last Refill:07/27/17 #90 tab no RF Last OV: 10/29/16 PCP: Dr.Sagardia Pharmacy:[facility]

## 2017-12-26 DIAGNOSIS — M25561 Pain in right knee: Secondary | ICD-10-CM | POA: Diagnosis not present

## 2017-12-26 DIAGNOSIS — M25562 Pain in left knee: Secondary | ICD-10-CM | POA: Diagnosis not present

## 2017-12-26 DIAGNOSIS — M25572 Pain in left ankle and joints of left foot: Secondary | ICD-10-CM | POA: Diagnosis not present

## 2017-12-31 ENCOUNTER — Ambulatory Visit: Payer: Self-pay

## 2017-12-31 NOTE — Telephone Encounter (Signed)
Pt. Called to schedule appt. and was transferred to Triage, due to c/o a fall 1.5 weeks ago.  Stated she feel straight back on her buttocks, when she was walking in her yard; landed on ground on a patch of grass and mostly dirt.  Denied any cuts/ lacerations or significant bruising.  Stated she has been stiff and sore.  c/o increased pain in left hip about 3-4 nights ago. Today, she has felt more discomfort in her left leg; stated when she puts the left leg on the floor, the leg hurts from the ankle up to the hip.  Also, reported swelling of bilateral lower extremities, from ankle to the hip; (L) > (R).  Stated there was swelling in the (L) leg, even prior to the fall.  Stated the swelling of her legs improve overnight, and worsen during the day.   Reported she wears compression stockings, and wraps her (L) knee with an ace bandage everyday.  Stated she wraps the left knee, due to arthritis in the knee joint.  Denied redness, warmth, tenderness of the left leg.  Appt. Given for 01/01/18 @ PCP office. Pt. Agrees with plan.      Reason for Disposition . [1] High-risk adult (e.g., age > 61, osteoporosis, chronic steroid use) AND [2] still hurts  Answer Assessment - Initial Assessment Questions 1. MECHANISM: "How did the injury happen?" (Consider the possibility of domestic violence or elder abuse)     2. ONSET: "When did the injury happen?" (Minutes or hours ago)     [place] about 1.5 weeks   3. LOCATION: "What part of the back is injured?"     Fell backward; landed on buttocks   4. SEVERITY: "Can you move the back normally?"     C/o stiffness  5. PAIN: "Is there any pain?" If so, ask: "How bad is the pain?"   (Scale 1-10; or mild, moderate, severe)     Mostly on left hip area  6. CORD SYMPTOMS: Any weakness or numbness of the arms or legs?"     Not asked  7. SIZE: For cuts, bruises, or swelling, ask: "How large is it?" (e.g., inches or centimeters)     Denied  8. TETANUS: For any breaks in the skin,  ask: "When was the last tetanus booster?"     n/a 9. OTHER SYMPTOMS: "Do you have any other symptoms?" (e.g., abdominal pain, blood in urine)     Left arm weakness/ wears a brace; had swelling in left LE; more painful since she fell 1.5 weeks ago  Protocols used: BACK INJURY-A-AH

## 2018-01-01 ENCOUNTER — Ambulatory Visit (INDEPENDENT_AMBULATORY_CARE_PROVIDER_SITE_OTHER): Payer: Medicare Other

## 2018-01-01 ENCOUNTER — Other Ambulatory Visit: Payer: Self-pay

## 2018-01-01 ENCOUNTER — Encounter: Payer: Self-pay | Admitting: Physician Assistant

## 2018-01-01 ENCOUNTER — Ambulatory Visit (INDEPENDENT_AMBULATORY_CARE_PROVIDER_SITE_OTHER): Payer: Medicare Other | Admitting: Physician Assistant

## 2018-01-01 VITALS — BP 130/70 | HR 93 | Temp 97.8°F | Ht 60.0 in | Wt 135.2 lb

## 2018-01-01 DIAGNOSIS — M25512 Pain in left shoulder: Secondary | ICD-10-CM | POA: Diagnosis not present

## 2018-01-01 DIAGNOSIS — M25572 Pain in left ankle and joints of left foot: Secondary | ICD-10-CM

## 2018-01-01 DIAGNOSIS — S79912A Unspecified injury of left hip, initial encounter: Secondary | ICD-10-CM | POA: Diagnosis not present

## 2018-01-01 DIAGNOSIS — G8929 Other chronic pain: Secondary | ICD-10-CM

## 2018-01-01 DIAGNOSIS — M25571 Pain in right ankle and joints of right foot: Secondary | ICD-10-CM | POA: Diagnosis not present

## 2018-01-01 DIAGNOSIS — M25552 Pain in left hip: Secondary | ICD-10-CM

## 2018-01-01 DIAGNOSIS — R079 Chest pain, unspecified: Secondary | ICD-10-CM | POA: Diagnosis not present

## 2018-01-01 DIAGNOSIS — E876 Hypokalemia: Secondary | ICD-10-CM | POA: Diagnosis not present

## 2018-01-01 NOTE — Progress Notes (Signed)
01/01/2018 3:36 PM   DOB: [birth year] / MRN: [id number]  SUBJECTIVE:  Latoya Bennett is a 82 y.o. female presenting for chronic hip pain left sided. Symptoms present for years.  The problem is waxing and waning. She has tried multiple nsaids.  Complains of left shoulder pain that can radiate to the chest.  No exertional component.  No leg swelling.  No history of heart disease.   She has No Known Allergies.   She  has a past medical history of Arthritis, GERD (gastroesophageal reflux disease), and Hypertension.    She  reports that she has never smoked. She has never used smokeless tobacco. She  has no sexual activity history on file. The patient  has a past surgical history that includes Abdominal hysterectomy.  Her family history includes Hypertension in her sister.  Review of Systems  Constitutional: Negative for chills, diaphoresis and fever.  Eyes: Negative.   Respiratory: Negative for cough, hemoptysis, sputum production, shortness of breath and wheezing.   Cardiovascular: Negative for chest pain, orthopnea and leg swelling.  Gastrointestinal: Negative for nausea.  Musculoskeletal: Positive for back pain, falls, joint pain, myalgias and neck pain.  Skin: Negative for rash.  Neurological: Negative for dizziness, sensory change, speech change, focal weakness and headaches.    The problem list and medications were reviewed and updated by myself where necessary and exist elsewhere in the encounter.   OBJECTIVE:  BP 130/70 (BP Location: Left Arm, Patient Position: Sitting, Cuff Size: Normal)   Pulse 93   Temp 97.8 F (36.6 C) (Oral)   Ht 5' (1.524 m)   Wt 135 lb 3.2 oz (61.3 kg)   SpO2 98%   BMI 26.40 kg/m   Wt Readings from Last 3 Encounters:  01/01/18 135 lb 3.2 oz (61.3 kg)  05/26/17 138 lb (62.6 kg)  03/13/17 137 lb 12.8 oz (62.5 kg)   Temp Readings from Last 3 Encounters:  01/01/18 97.8 F (36.6 C) (Oral)  05/26/17 97.8 F (36.6 C) (Oral)  03/13/17  98.6 F (37 C) (Oral)   BP Readings from Last 3 Encounters:  01/01/18 130/70  05/26/17 (!) 151/99  03/13/17 (!) 142/64   Pulse Readings from Last 3 Encounters:  01/01/18 93  05/26/17 (!) 120  03/13/17 (!) 110    Physical Exam  No results found for: HGBA1C  Lab Results  Component Value Date   WBC 5.3 10/29/2016   HGB 13.6 10/29/2016   HCT 40.5 10/29/2016   MCV 74 (L) 10/29/2016   PLT 189 10/29/2016    Lab Results  Component Value Date   CREATININE 0.69 10/29/2016   BUN 13 10/29/2016   NA 143 10/29/2016   K 3.3 (L) 10/29/2016   CL 99 10/29/2016   CO2 29 10/29/2016    Lab Results  Component Value Date   ALT 12 10/29/2016   AST 16 10/29/2016   ALKPHOS 106 10/29/2016   BILITOT 0.5 10/29/2016    Lab Results  Component Value Date   TSH 2.480 10/29/2016   Dg Chest 2 View  Result Date: 01/01/2018 CLINICAL DATA:  Chest pain EXAM: CHEST - 2 VIEW COMPARISON:  09/13/2005 FINDINGS: The heart size and mediastinal contours are within normal limits. Both lungs are clear. The visualized skeletal structures are unremarkable. IMPRESSION: No acute abnormality noted. Electronically Signed   By: Inez Catalina M.D.   On: 01/01/2018 15:16   Dg Shoulder Left  Result Date: 01/01/2018 CLINICAL DATA:  Left shoulder pain, no known injury, initial encounter  EXAM: LEFT SHOULDER - 2+ VIEW COMPARISON:  None. FINDINGS: Degenerative changes of the acromioclavicular joint are noted. Glenohumeral degenerative changes are noted as well. Changes of subchondral cyst formation in the glenoid are seen. No soft tissue abnormality is noted. No acute fracture or dislocation is seen. IMPRESSION: Degenerative change without acute abnormality. Electronically Signed   By: Inez Catalina M.D.   On: 01/01/2018 15:16   Dg Hip Unilat W Or W/o Pelvis 2-3 Views Left  Result Date: 01/01/2018 CLINICAL DATA:  Left hip pain, status post fall. EXAM: DG HIP (WITH OR WITHOUT PELVIS) 2-3V LEFT COMPARISON:  None. FINDINGS:  There is no evidence of hip fracture or dislocation. Mild osteoarthritic changes of bilateral hips. Pelvic bones look intact. Soft tissues are grossly normal. IMPRESSION: No acute fracture or dislocation identified about the left hip. Electronically Signed   By: Fidela Salisbury M.D.   On: 01/01/2018 14:07     No results found for: CHOL, HDL, LDLCALC, LDLDIRECT, TRIG, CHOLHDL   ASSESSMENT AND PLAN:  Latoya Bennett was seen today for fall.  Diagnoses and all orders for this visit:  Pain of left hip joint: Advised tylenol 100 tid and tramadol.  -     DG HIP UNILAT W OR W/O PELVIS 2-3 VIEWS LEFT; Future  Chronic pain of both ankles: Most likely mediated by OA per most recent rds.  Patient requesting further work up.  -     Rheumatoid factor -     CYCLIC CITRUL PEPTIDE ANTIBODY, IGG/IGA  Hypokalemia -     Renal Function Panel  Chronic left shoulder pain: Mediated by OA.  -     EKG 12-Lead -     DG Chest 2 View; Future -     DG Shoulder Left; Future    The patient is advised to call or return to clinic if she does not see an improvement in symptoms, or to seek the care of the closest emergency department if she worsens with the above plan.   Philis Fendt, MHS, PA-C [facility] 01/01/2018 3:36 PM

## 2018-01-01 NOTE — Patient Instructions (Addendum)
You can only take one type of NSAID. Given your history of dark stools it may be best to avoid these.   You can take 1000 mg of tylenol every 8 hours for pain and there is very little risk of GI bleeding with this.  If your pain continues to be unmanageable then try 1/2 a tramadol.   Lab Results  Component Value Date   CREATININE 0.69 10/29/2016   BUN 13 10/29/2016   NA 143 10/29/2016   K 3.3 (L) 10/29/2016   CL 99 10/29/2016   CO2 29 10/29/2016       IF you received an x-ray today, you will receive an invoice from [facility]. Please contact [facility] at [phone] with questions or concerns regarding your invoice.   IF you received labwork today, you will receive an invoice from [facility]. Please contact [facility] at [phone] with questions or concerns regarding your invoice.   Our billing staff will not be able to assist you with questions regarding bills from these companies.  You will be contacted with the lab results as soon as they are available. The fastest way to get your results is to activate your My Chart account. Instructions are located on the last page of this paperwork. If you have not heard from [place] regarding the results in 2 weeks, please contact this office.

## 2018-01-04 LAB — RENAL FUNCTION PANEL
ALBUMIN: 4.2 g/dL (ref 3.5–4.7)
BUN/Creatinine Ratio: 15 (ref 12–28)
BUN: 11 mg/dL (ref 8–27)
CALCIUM: 10.3 mg/dL (ref 8.7–10.3)
CHLORIDE: 104 mmol/L (ref 96–106)
CO2: 26 mmol/L (ref 20–29)
Creatinine, Ser: 0.74 mg/dL (ref 0.57–1.00)
GFR calc non Af Amer: 77 mL/min/{1.73_m2} (ref >59)
GFR, EST AFRICAN AMERICAN: 88 mL/min/{1.73_m2} (ref >59)
Glucose: 110 mg/dL — ABNORMAL HIGH (ref 65–99)
PHOSPHORUS: 2.6 mg/dL (ref 2.5–4.5)
Potassium: 2.9 mmol/L — ABNORMAL LOW (ref 3.5–5.2)
Sodium: 146 mmol/L — ABNORMAL HIGH (ref 134–144)

## 2018-01-04 LAB — RHEUMATOID FACTOR: Rhuematoid fact SerPl-aCnc: <10 IU/mL (ref 0.0–13.9)

## 2018-01-04 LAB — CYCLIC CITRUL PEPTIDE ANTIBODY, IGG/IGA: Cyclic Citrullin Peptide Ab: 13 units (ref 0–19)

## 2018-01-05 MED ORDER — POTASSIUM CHLORIDE CRYS ER 20 MEQ PO TBCR: 20.0000 meq | EXTENDED_RELEASE_TABLET | Freq: Every day | ORAL | 3 refills | Status: DC

## 2018-03-04 ENCOUNTER — Other Ambulatory Visit: Payer: Self-pay | Admitting: Emergency Medicine

## 2018-03-06 ENCOUNTER — Other Ambulatory Visit: Payer: Self-pay | Admitting: Family Medicine

## 2018-03-09 NOTE — Telephone Encounter (Signed)
Amlodipine 10 mg  refill Last Refill: 12/14/17 # 30 Last OV: 01/01/18 PCP: Leane Platt Pharmacy:[facility] # [phone]

## 2018-03-18 ENCOUNTER — Other Ambulatory Visit: Payer: Self-pay | Admitting: Family Medicine

## 2018-03-18 NOTE — Telephone Encounter (Signed)
Refill of amlodipine  LRF 12/14/17  #30  0 refills  LOV 01/01/18 M. Clark  LOV when hypertension addressed over 1 year ago.  3 refills since appt.  Called pt to schedule appt. Pt requesting refill; states too hard for her to get in to appt.

## 2018-03-23 ENCOUNTER — Other Ambulatory Visit: Payer: Self-pay | Admitting: Emergency Medicine

## 2018-03-23 ENCOUNTER — Telehealth: Payer: Self-pay | Admitting: Emergency Medicine

## 2018-03-23 MED ORDER — AMLODIPINE BESYLATE 10 MG PO TABS: ORAL_TABLET | ORAL | 0 refills | Status: DC

## 2018-03-23 NOTE — Telephone Encounter (Signed)
Phone call to pt. To discuss her request for refill on Amlodipine.  Stated she ran out about one week ago.  Noted that last refill 12/2017 was only given for 30 days.  Stated she had a 90 day supply.   Amlodipine refill Last Refill: 12/14/17 # 30; no refills Last OV: acute visit 01/01/18                 HTN visit 11/28/16 PCP: [name] [facility]acy:[facility] [address]   Advised pt. will give a 15 day refill to get her through to her physical on 04/02/18.  Advised to keep the appt. on 9/27 for future refills.  Verb. Understanding.

## 2018-03-23 NOTE — Telephone Encounter (Signed)
Copied from [place] [phone]. Topic: Quick Communication - Rx Refill/Question >> Mar 23, 2018 10:32 AM Leward Quan A wrote: Medication: amLODipine (NORVASC) 10 MG tablet  Has the patient contacted their pharmacy? Yes  Patient stated that she has been out of medication for one week..  Preferred Pharmacy (with phone number or street name): [id number], [place] [phone] (Phone) [phone] (Fax)    Agent: Please be advised that RX refills may take up to 3 business days. We ask that you follow-up with your pharmacy.

## 2018-04-01 ENCOUNTER — Telehealth: Payer: Self-pay | Admitting: Emergency Medicine

## 2018-04-01 NOTE — Telephone Encounter (Signed)
Copied from [place] [phone]. Topic: Quick Communication - See Telephone Encounter >> Apr 01, 2018  5:03 PM Blase Mess A wrote: CRM for notification. See Telephone encounter for: 04/01/18. Patient is calling to schedule AWV Please advise Patient call back [phone]

## 2018-04-02 ENCOUNTER — Ambulatory Visit: Payer: Medicare Other | Admitting: Emergency Medicine

## 2018-04-02 ENCOUNTER — Telehealth: Payer: Self-pay | Admitting: Emergency Medicine

## 2018-04-02 MED ORDER — AMLODIPINE BESYLATE 10 MG PO TABS: ORAL_TABLET | ORAL | 1 refills | Status: DC

## 2018-04-02 NOTE — Telephone Encounter (Signed)
Copied from [place] [phone]. Topic: General - Other >> Apr 02, 2018  1:31 PM Lennox Solders wrote: Reason for CRM: pt is calling she went to office today to pick up her xray. Pt did not realize she had an appt at 10 am with dr Franky Macho. Pt needs a refill on bp med amlodipine . [place]

## 2018-04-02 NOTE — Telephone Encounter (Signed)
Refill sent but needs to schedule OV.

## 2018-04-02 NOTE — Telephone Encounter (Signed)
Please see note below. 

## 2018-04-02 NOTE — Telephone Encounter (Signed)
Called pt to try and schedule her for an AWV with [name] per her telephone message on 04/01/18. :Left VM for pt to call the office and schedule at her convenience.  Thank you!

## 2018-04-14 ENCOUNTER — Emergency Department (HOSPITAL_COMMUNITY)
Admission: EM | Admit: 2018-04-14 | Discharge: 2018-04-14 | Disposition: A | Discharge status: Home / Self Care | Payer: Medicare Other | Source: Home / Self Care | Attending: Emergency Medicine | Admitting: Emergency Medicine

## 2018-04-14 ENCOUNTER — Emergency Department (HOSPITAL_COMMUNITY): Payer: Medicare Other

## 2018-04-14 ENCOUNTER — Ambulatory Visit: Payer: Self-pay | Admitting: *Deleted

## 2018-04-14 ENCOUNTER — Encounter (HOSPITAL_COMMUNITY): Payer: Self-pay | Admitting: Radiology

## 2018-04-14 ENCOUNTER — Ambulatory Visit: Payer: Medicare Other | Admitting: Emergency Medicine

## 2018-04-14 DIAGNOSIS — K5791 Diverticulosis of intestine, part unspecified, without perforation or abscess with bleeding: Secondary | ICD-10-CM | POA: Diagnosis not present

## 2018-04-14 DIAGNOSIS — E876 Hypokalemia: Secondary | ICD-10-CM | POA: Insufficient documentation

## 2018-04-14 DIAGNOSIS — Z79899 Other long term (current) drug therapy: Secondary | ICD-10-CM

## 2018-04-14 DIAGNOSIS — K5790 Diverticulosis of intestine, part unspecified, without perforation or abscess without bleeding: Secondary | ICD-10-CM

## 2018-04-14 DIAGNOSIS — E1165 Type 2 diabetes mellitus with hyperglycemia: Secondary | ICD-10-CM | POA: Diagnosis not present

## 2018-04-14 DIAGNOSIS — K922 Gastrointestinal hemorrhage, unspecified: Secondary | ICD-10-CM | POA: Diagnosis not present

## 2018-04-14 DIAGNOSIS — R42 Dizziness and giddiness: Secondary | ICD-10-CM | POA: Insufficient documentation

## 2018-04-14 DIAGNOSIS — K2971 Gastritis, unspecified, with bleeding: Secondary | ICD-10-CM | POA: Diagnosis not present

## 2018-04-14 DIAGNOSIS — R000 Tachycardia, unspecified: Secondary | ICD-10-CM | POA: Diagnosis not present

## 2018-04-14 DIAGNOSIS — K449 Diaphragmatic hernia without obstruction or gangrene: Secondary | ICD-10-CM | POA: Diagnosis not present

## 2018-04-14 DIAGNOSIS — R197 Diarrhea, unspecified: Secondary | ICD-10-CM | POA: Insufficient documentation

## 2018-04-14 DIAGNOSIS — K921 Melena: Secondary | ICD-10-CM

## 2018-04-14 DIAGNOSIS — D649 Anemia, unspecified: Secondary | ICD-10-CM | POA: Diagnosis not present

## 2018-04-14 DIAGNOSIS — R58 Hemorrhage, not elsewhere classified: Secondary | ICD-10-CM | POA: Diagnosis not present

## 2018-04-14 DIAGNOSIS — I959 Hypotension, unspecified: Secondary | ICD-10-CM | POA: Diagnosis not present

## 2018-04-14 DIAGNOSIS — I10 Essential (primary) hypertension: Secondary | ICD-10-CM

## 2018-04-14 DIAGNOSIS — R1032 Left lower quadrant pain: Secondary | ICD-10-CM

## 2018-04-14 DIAGNOSIS — R0902 Hypoxemia: Secondary | ICD-10-CM | POA: Diagnosis not present

## 2018-04-14 DIAGNOSIS — D62 Acute posthemorrhagic anemia: Secondary | ICD-10-CM | POA: Diagnosis not present

## 2018-04-14 DIAGNOSIS — K5730 Diverticulosis of large intestine without perforation or abscess without bleeding: Secondary | ICD-10-CM | POA: Diagnosis not present

## 2018-04-14 LAB — URINALYSIS, ROUTINE W REFLEX MICROSCOPIC
Bilirubin Urine: NEGATIVE
GLUCOSE, UA: NEGATIVE mg/dL
KETONES UR: NEGATIVE mg/dL
LEUKOCYTES UA: NEGATIVE
Nitrite: NEGATIVE
PROTEIN: NEGATIVE mg/dL
Specific Gravity, Urine: 1.015 (ref 1.005–1.030)
pH: 7 (ref 5.0–8.0)

## 2018-04-14 LAB — COMPREHENSIVE METABOLIC PANEL
ALK PHOS: 54 U/L (ref 38–126)
ALT: 14 U/L (ref 0–44)
AST: 21 U/L (ref 15–41)
Albumin: 3.8 g/dL (ref 3.5–5.0)
Anion gap: 13 (ref 5–15)
BUN: 14 mg/dL (ref 8–23)
CALCIUM: 9.8 mg/dL (ref 8.9–10.3)
CHLORIDE: 103 mmol/L (ref 98–111)
CO2: 26 mmol/L (ref 22–32)
CREATININE: 0.91 mg/dL (ref 0.44–1.00)
GFR calc Af Amer: >60 mL/min (ref >60)
GFR, EST NON AFRICAN AMERICAN: 58 mL/min — AB (ref >60)
Glucose, Bld: 202 mg/dL — ABNORMAL HIGH (ref 70–99)
Potassium: 2.7 mmol/L — CL (ref 3.5–5.1)
Sodium: 142 mmol/L (ref 135–145)
Total Bilirubin: 0.5 mg/dL (ref 0.3–1.2)
Total Protein: 6.2 g/dL — ABNORMAL LOW (ref 6.5–8.1)

## 2018-04-14 LAB — CBC WITH DIFFERENTIAL/PLATELET
ABS IMMATURE GRANULOCYTES: 0.05 10*3/uL (ref 0.00–0.07)
Basophils Absolute: 0 10*3/uL (ref 0.0–0.1)
Basophils Relative: 0 %
Eosinophils Absolute: 0 10*3/uL (ref 0.0–0.5)
Eosinophils Relative: 0 %
HEMATOCRIT: 33.9 % — AB (ref 36.0–46.0)
HEMOGLOBIN: 11.3 g/dL — AB (ref 12.0–15.0)
Immature Granulocytes: 1 %
LYMPHS PCT: 12 %
Lymphs Abs: 1.1 10*3/uL (ref 0.7–4.0)
MCH: 25.1 pg — AB (ref 26.0–34.0)
MCHC: 33.3 g/dL (ref 30.0–36.0)
MCV: 75.3 fL — AB (ref 80.0–100.0)
MONO ABS: 0.2 10*3/uL (ref 0.1–1.0)
MONOS PCT: 2 %
Neutro Abs: 7.4 10*3/uL (ref 1.7–7.7)
Neutrophils Relative %: 85 %
Platelets: 161 10*3/uL (ref 150–400)
RBC: 4.5 MIL/uL (ref 3.87–5.11)
RDW: 14 % (ref 11.5–15.5)
WBC: 8.7 10*3/uL (ref 4.0–10.5)
nRBC: 0.2 % (ref 0.0–0.2)

## 2018-04-14 LAB — POC OCCULT BLOOD, ED: Fecal Occult Bld: POSITIVE — AB

## 2018-04-14 LAB — MAGNESIUM: Magnesium: 1.8 mg/dL (ref 1.7–2.4)

## 2018-04-14 LAB — LIPASE, BLOOD: Lipase: 21 U/L (ref 11–51)

## 2018-04-14 MED ORDER — POTASSIUM CHLORIDE CRYS ER 20 MEQ PO TBCR: 20.0000 meq | EXTENDED_RELEASE_TABLET | Freq: Every day | ORAL | 0 refills | Status: DC

## 2018-04-14 MED ORDER — POTASSIUM CHLORIDE CRYS ER 20 MEQ PO TBCR
40.0000 meq | EXTENDED_RELEASE_TABLET | Freq: Once | ORAL | Status: AC
  Administered 2018-04-14: 40 meq via ORAL
  Filled 2018-04-14: qty 2

## 2018-04-14 MED ORDER — IOPAMIDOL (ISOVUE-300) INJECTION 61%
100.0000 mL | Freq: Once | INTRAVENOUS | Status: AC | PRN
  Administered 2018-04-14: 100 mL via INTRAVENOUS

## 2018-04-14 MED ORDER — POTASSIUM CHLORIDE 10 MEQ/100ML IV SOLN
10.0000 meq | INTRAVENOUS | Status: DC
  Administered 2018-04-14 (×3): 10 meq via INTRAVENOUS
  Filled 2018-04-14 (×3): qty 100

## 2018-04-14 MED ORDER — IOPAMIDOL (ISOVUE-300) INJECTION 61%
INTRAVENOUS | Status: AC
  Filled 2018-04-14: qty 100

## 2018-04-14 MED ORDER — SODIUM CHLORIDE 0.9 % IV BOLUS
500.0000 mL | Freq: Once | INTRAVENOUS | Status: AC
  Administered 2018-04-14: 500 mL via INTRAVENOUS

## 2018-04-14 MED ORDER — POTASSIUM CHLORIDE CRYS ER 20 MEQ PO TBCR: 40.0000 meq | EXTENDED_RELEASE_TABLET | Freq: Once | ORAL | Status: DC

## 2018-04-14 MED ORDER — SODIUM CHLORIDE 0.9 % IJ SOLN
INTRAMUSCULAR | Status: AC
  Filled 2018-04-14: qty 50

## 2018-04-14 MED ORDER — MAGNESIUM SULFATE 50 % IJ SOLN: 1.0000 g | Freq: Once | INTRAMUSCULAR | Status: DC

## 2018-04-14 MED ORDER — MAGNESIUM SULFATE IN D5W 1-5 GM/100ML-% IV SOLN
1.0000 g | Freq: Once | INTRAVENOUS | Status: AC
  Administered 2018-04-14: 1 g via INTRAVENOUS
  Filled 2018-04-14: qty 100

## 2018-04-14 NOTE — ED Notes (Signed)
Patient transported to CT 

## 2018-04-14 NOTE — Telephone Encounter (Signed)
Pt called stating that she went to the bathroom 3 times this morning and had bloody stools; she says that she had loose stools for the past 3-4 days; the pt also says that she was told if she had rectal bleeding again, she should go to have a CT done because it may be her diverticulitis;  recommendations made per nurse triage protocol; the pt states that she will have her sister take her to the ED; pt also has an appointment Dr Mitchel Honour, [place], 04/14/18 at 1340; she would like to cancel this appointment since she is going to the ED.   Reason for Disposition . SEVERE rectal bleeding (large blood clots; on and off, or constant bleeding)  Answer Assessment - Initial Assessment Questions 1. APPEARANCE of BLOOD: "What color is it?" "Is it passed separately, on the surface of the stool, or mixed in with the stool?"      Bright red with clots; mixed with stool the 1st time; pure blood the next 2 tines 2. AMOUNT: "How much blood was passed?"      Unable to quantifiy 3. FREQUENCY: "How many times has blood been passed with the stools?"      3 4. ONSET: "When was the blood first seen in the stools?" (Days or weeks)      04/14/18 at 0700 5. DIARRHEA: "Is there also some diarrhea?" If so, ask: "How many diarrhea stools were passed in past 24 hours?"      Yes; 5 episodes 6. CONSTIPATION: "Do you have constipation?" If so, "How bad is it?"     no 7. RECURRENT SYMPTOMS: "Have you had blood in your stools before?" If so, ask: "When was the last time?" and "What happened that time?"      Yes; diverticulitis 8. BLOOD THINNERS: "Do you take any blood thinners?" (e.g., Coumadin/warfarin, Pradaxa/dabigatran, aspirin)   no 9. OTHER SYMPTOMS: "Do you have any other symptoms?"  (e.g., abdominal pain, vomiting, dizziness, fever)     Abdominal pain 10. PREGNANCY: "Is there any chance you are pregnant?" "When was your last menstrual period?"       no  Protocols used: RECTAL BLEEDING-A-AH

## 2018-04-14 NOTE — ED Provider Notes (Signed)
[facility] DEPT Provider Note   CSN: [id number] Arrival date & time: 04/14/18  1032     History   Chief Complaint Chief Complaint  Patient presents with  . Rectal Bleeding    HPI Latoya Bennett is a 82 y.o. female with a reported history of diverticulosis who presents emergency department today for rectal bleeding.  Patient lives at home by herself.  She reports this morning she had 3 episodes of bright red blood per the rectum.  She reports leading up to this she has had 2-3 episodes of diarrhea per day.  She notes this morning with her symptom onset she did have some left lower quadrant abdominal pain that she describes as pressure-like in sensation and feels similar when she has had diverticulitis in the past.  Patient is not on any blood thinners.  No fever, chills, chest pain, shortness of breath, upper abdominal pain, chronic NSAID use, Pepto-Bismol use, iron use, melena.  She does note some dizziness after the episodes which caused her to fall onto her bed.  Denies any head trauma or loss of consciousness.  No other injuries noted.  EMS arrived and found that her pressure was low at 80/60.  Upon arrival her blood pressure is improved.  No interventions prior to arrival.  Her last colonoscopy was 5 years ago and showed evidence of polyps and diverticulosis per patient.  I am unable to see these results.  She is not sure who she follows with for GI.  There is a note from 2007 from the [facility] for a colonoscopy that showed diverticulosis was completed secondary to painless hematochezia.  HPI  Past Medical History:  Diagnosis Date  . Arthritis   . GERD (gastroesophageal reflux disease)   . Hypertension     Patient Active Problem List   Diagnosis Date Noted  . Chronic pain of both ankles 03/13/2017  . Osteoarthritis of both ankles 03/13/2017  . PAC (premature atrial contraction) 11/24/2016  . Essential hypertension 11/24/2016  . Primary  osteoarthritis of left knee 11/24/2016  . Tachycardia 11/24/2016    Past Surgical History:  Procedure Laterality Date  . ABDOMINAL HYSTERECTOMY       OB History   None      Home Medications    Prior to Admission medications   Medication Sig Start Date End Date Taking? Authorizing Provider  amLODipine (NORVASC) 10 MG tablet TAKE 1 TABLET(10 MG) BY MOUTH DAILY 04/02/18   Horald Pollen, MD  potassium chloride SA (K-DUR,KLOR-CON) 20 MEQ tablet Take 1 tablet (20 mEq total) by mouth daily. 01/05/18   Tereasa Coop, PA-C    Family History Family History  Problem Relation Age of Onset  . Hypertension Sister     Social History Social History   Tobacco Use  . Smoking status: Never Smoker  . Smokeless tobacco: Never Used  Substance Use Topics  . Alcohol use: Not on file  . Drug use: Not on file     Allergies   Patient has no known allergies.   Review of Systems Review of Systems  All other systems reviewed and are negative.    Physical Exam Updated Vital Signs BP 122/72 (BP Location: Left Arm)   Pulse 65   Temp (!) 97.4 F (36.3 C) (Oral)   Resp 19   SpO2 100%   Physical Exam  Constitutional: She appears well-developed and well-nourished.  HENT:  Head: Normocephalic and atraumatic.  Right Ear: External ear normal.  Left Ear:  External ear normal.  Nose: Nose normal.  Mouth/Throat: Uvula is midline, oropharynx is clear and moist and mucous membranes are normal. No tonsillar exudate.  Eyes: Pupils are equal, round, and reactive to light. Right eye exhibits no discharge. Left eye exhibits no discharge. No scleral icterus.  Neck: Trachea normal. Neck supple. No spinous process tenderness present. No neck rigidity. Normal range of motion present.  No C-Spine ttp or step offs.   Cardiovascular: Normal rate, regular rhythm and intact distal pulses.  No murmur heard. Pulses:      Radial pulses are 2+ on the right side, and 2+ on the left side.        Dorsalis pedis pulses are 2+ on the right side, and 2+ on the left side.       Posterior tibial pulses are 2+ on the right side, and 2+ on the left side.  No lower extremity swelling or edema. Calves symmetric in size bilaterally.  Pulmonary/Chest: Effort normal and breath sounds normal. She exhibits no tenderness.  Abdominal: Soft. Bowel sounds are normal. She exhibits no distension. There is no tenderness. There is no rigidity, no rebound, no guarding and no CVA tenderness.  Genitourinary:  Genitourinary Comments: Chaperone was present.  Patient without pain around the rectal area. Perianal sensory intact. Possible small external fissure's examined. No external hemorrhoids noted. No induration of the skin or swelling. Digital Rectal Exam reveals sphincter with good tone. No masses palpated. Stool color is with overt blood   Musculoskeletal: She exhibits no edema.  Moving all extremities without difficulty.   Lymphadenopathy:    She has no cervical adenopathy.  Neurological: She is alert.  Speech clear. Follows commands. No facial droop. PERRLA. EOMI. Normal peripheral fields. CN III-XII intact.  Grossly moves all extremities 4 without ataxia. Coordination intact. Able and appropriate strength for age to upper and lower extremities bilaterally including grip strength & plantar flexion/dorsiflexion. Sensation to light touch intact bilaterally for upper and lower. Normal finger to nose. No pronator drift.  Skin: Skin is warm and dry. No rash noted. She is not diaphoretic.  Psychiatric: She has a normal mood and affect.  Nursing note and vitals reviewed.    ED Treatments / Results  Labs (all labs ordered are listed, but only abnormal results are displayed) Labs Reviewed  COMPREHENSIVE METABOLIC PANEL - Abnormal; Notable for the following components:      Result Value   Potassium 2.7 (*)    Glucose, Bld 202 (*)    Total Protein 6.2 (*)    GFR calc non Af Amer 58 (*)    All other  components within normal limits  CBC WITH DIFFERENTIAL/PLATELET - Abnormal; Notable for the following components:   Hemoglobin 11.3 (*)    HCT 33.9 (*)    MCV 75.3 (*)    MCH 25.1 (*)    All other components within normal limits  POC OCCULT BLOOD, ED - Abnormal; Notable for the following components:   Fecal Occult Bld POSITIVE (*)    All other components within normal limits  LIPASE, BLOOD  MAGNESIUM  URINALYSIS, ROUTINE W REFLEX MICROSCOPIC  PROTIME-INR  APTT  I-STAT CHEM 8, ED  TYPE AND SCREEN    EKG EKG Interpretation  Date/Time:  Wednesday April 14 2018 12:29:36 EDT Ventricular Rate:  100 PR Interval:    QRS Duration: 92 QT Interval:  345 QTC Calculation: 445 R Axis:   -30 Text Interpretation:  Sinus tachycardia Multiple premature complexes, vent & supraven Sinus pause  Left axis deviation Abnormal R-wave progression, late transition Nonspecific T abnormalities, lateral leads since last tracing no significant change Confirmed by Daleen Bo [phone]) on 04/14/2018 12:38:46 PM   Radiology Ct Abdomen Pelvis W Contrast  Result Date: 04/14/2018 CLINICAL DATA:  Rectal bleeding this morning. History of diverticulitis. EXAM: CT ABDOMEN AND PELVIS WITH CONTRAST TECHNIQUE: Multidetector CT imaging of the abdomen and pelvis was performed using the standard protocol following bolus administration of intravenous contrast. CONTRAST:  146mL ISOVUE-300 IOPAMIDOL (ISOVUE-300) INJECTION 61% COMPARISON:  July 05, 2009 FINDINGS: Lower chest: Minimal atelectasis of posterior lung bases. The heart size is normal. There is minimal pericardial effusions. Hepatobiliary: Focal fatty sparing is identified around the falciform ligament. The liver is otherwise unremarkable. Gallstones are noted in the gallbladder. The biliary tree is normal. Pancreas: The pancreas is atrophic. Calcifications are identified in the pancreatic head unchanged. Spleen: Normal in size without focal abnormality.  Adrenals/Urinary Tract: Fullness of right adrenal gland is unchanged. The left adrenal gland is normal. Bilateral kidney cysts are noted. There is no hydronephrosis bilaterally. The bladder is normal. Stomach/Bowel: Stomach is within normal limits. Appendix appears normal. No evidence of bowel wall thickening, distention, or inflammatory changes. There is diverticulosis of colon without diverticulitis. Vascular/Lymphatic: Aortic atherosclerosis. No enlarged abdominal or pelvic lymph nodes. Reproductive: Status post hysterectomy. No adnexal masses. Other: No abdominal wall hernia or abnormality. No abdominopelvic ascites. Musculoskeletal: Degenerative joint changes of the spine are noted. IMPRESSION: No acute abnormality. Diverticulosis of colon without diverticulitis. Electronically Signed   By: Abelardo Diesel M.D.   On: 04/14/2018 14:14    Procedures Procedures (including critical care time)  Medications Ordered in ED Medications  potassium chloride 10 mEq in 100 mL IVPB (10 mEq Intravenous New Bag/Given 04/14/18 1501)  iopamidol (ISOVUE-300) 61 % injection (has no administration in time range)  sodium chloride 0.9 % injection (has no administration in time range)  sodium chloride 0.9 % bolus 500 mL (0 mLs Intravenous Stopped 04/14/18 1219)  magnesium sulfate IVPB 1 g 100 mL (0 g Intravenous Stopped 04/14/18 1501)  iopamidol (ISOVUE-300) 61 % injection 100 mL (100 mLs Intravenous Contrast Given 04/14/18 1320)  potassium chloride SA (K-DUR,KLOR-CON) CR tablet 40 mEq (40 mEq Oral Given 04/14/18 1500)     Initial Impression / Assessment and Plan / ED Course  I have reviewed the triage vital signs and the nursing notes.  Pertinent labs & imaging results that were available during my care of the patient were reviewed by me and considered in my medical decision making (see chart for details).  Clinical Course as of Apr 14 1506  Wed Apr 14, 2018  1227 Low potassium noted.  EKG ordered.  IV potassium 10  mEq over 1 hour x4 ordered.  Will hold off on oral potassium until CT scan results.  IV magnesium ordered.  Magnesium level ordered.  Suspect likely related to patient's reported diarrhea over the last several days.  Potassium(!!): 2.7 [MM]    Clinical Course User Index [MM] Jillyn Ledger, PA-C    34-year-old female with a history of diverticulosis previously followed by the [facility] who presents emergency department today for hematochezia.  Patient reports she had 3 episodes of bright red blood per the rectum earlier this morning.  She notes some associated left lower quadrant pain associated with this originally but this is since resolved.  She is found to be mildly hypertensive by EMS originally but this is also resolved without intervention.  She was noted to  be dizzy after standing and had a fall onto her bed but denies any head trauma or loss conscious.  She has a normal neurologic exam.  On presentation patient is without any pale conjunctiva and appears well-hydrated.  No abdominal tenderness palpation.  Rectal exam is with bloody stools on DRE.  Fecal occult is positive.  Will give IV fluids and obtain lab work and imaging.  Patient potassium is low at 2.7.  Please see clinical course note above.  Patient without leukocytosis.  Mild anemia.  This does not require transfusion.  No acute kidney injury.  LFTs within normal limits.  CT is with out evidence of any acute abnormalities.  There is diverticulosis without diverticulitis.  Patient without any nausea vomiting department.  Repeat abdominal exam without any tenderness or peritoneal signs.  She patient does not have an acute surgical abdomen.  She has had stable vital signs without tachycardia or hypotension in the department.  Oral potassium was given.  Patient will be stable for outpatient follow-up.  Will give 5-day course of potassium to take at home.  Recommended PCP follow-up in 1 week for repeat testing of potassium.  She is to  follow-up with GI in regards to her hematochezia.  Return precautions discussed.  Patient appears safe for discharge.  Patient ambulatory in the department.  Patient case seen and discussed with Dr. Eulis Foster who is in agreement with plan.   Final Clinical Impressions(s) / ED Diagnoses   Final diagnoses:  Hematochezia  Diverticulosis  Hypokalemia    ED Discharge Orders         Ordered    potassium chloride SA (K-DUR,KLOR-CON) 20 MEQ tablet  Daily     04/14/18 1507           Jillyn Ledger, PA-C 04/14/18 1513    Daleen Bo, MD 04/14/18 1520

## 2018-04-14 NOTE — ED Triage Notes (Signed)
Transported by [facility] from home-- onset this morning at 7 am--rectal bleeding. +dizziness. Hx of diverticulitis. CBG read 300 mg/dl. Initial BP 84/60.

## 2018-04-14 NOTE — ED Notes (Signed)
Pt ambulatory to restroom with 1 assist. 

## 2018-04-14 NOTE — Discharge Instructions (Signed)
You were seen here today for bloody stools  Your blood work showed your potassium was low. This was replaced in the department. Please take potassium over the next 5 days as prescribed. Please see attached handout. You will need to follow up with your pcp in 1 week to have repeat testing to ensure this has improved.  Your CT scan showed diverticulosis without evidence of diverticulitis.  Please call your GI doctor ([facility]) tomorrow to schedule an appointment for follow up  If you develop worsening or new concerning symptoms you can return to the emergency department for re-evaluation.

## 2018-04-14 NOTE — ED Notes (Signed)
Bed: WA06 Expected date:  Expected time:  Means of arrival:  Comments: EMS/82 y o rectal bleed

## 2018-04-14 NOTE — ED Notes (Signed)
Date and time results received: 04/14/18 1215 (use smartphrase ".now" to insert current time)  Test: K+ Critical Value: 2.7  Name of Provider Notified: Elon Jester  Orders Received? Or Actions Taken?:

## 2018-04-14 NOTE — ED Provider Notes (Signed)
  Face-to-face evaluation   History: She presents for evaluation of rectal bleeding, noticed today.  She is also had some abdominal pain the last few days.  She has history of diverticulitis.  CBG on arrival was elevated.  Physical exam: Alert elderly patient who is comfortable.  Abdomen soft nontender to palpation.  Patient is lucid and cooperative.  Medical screening examination/treatment/procedure(s) were conducted as a shared visit with non-physician practitioner(s) and myself.  I personally evaluated the patient during the encounter    Daleen Bo, MD 04/14/18 1521

## 2018-04-15 ENCOUNTER — Encounter: Payer: Self-pay | Admitting: Gastroenterology

## 2018-04-17 ENCOUNTER — Inpatient Hospital Stay (HOSPITAL_COMMUNITY): Payer: Medicare Other

## 2018-04-17 ENCOUNTER — Inpatient Hospital Stay (HOSPITAL_COMMUNITY)
Admission: EM | Admit: 2018-04-17 | Discharge: 2018-04-20 | DRG: 378 | Disposition: A | Discharge status: Home Health | Payer: Medicare Other | Attending: Internal Medicine | Admitting: Internal Medicine

## 2018-04-17 ENCOUNTER — Encounter (HOSPITAL_COMMUNITY): Payer: Self-pay | Admitting: *Deleted

## 2018-04-17 ENCOUNTER — Other Ambulatory Visit: Payer: Self-pay

## 2018-04-17 DIAGNOSIS — Z79899 Other long term (current) drug therapy: Secondary | ICD-10-CM | POA: Diagnosis not present

## 2018-04-17 DIAGNOSIS — E876 Hypokalemia: Secondary | ICD-10-CM | POA: Diagnosis not present

## 2018-04-17 DIAGNOSIS — M19072 Primary osteoarthritis, left ankle and foot: Secondary | ICD-10-CM | POA: Diagnosis not present

## 2018-04-17 DIAGNOSIS — K449 Diaphragmatic hernia without obstruction or gangrene: Secondary | ICD-10-CM | POA: Diagnosis not present

## 2018-04-17 DIAGNOSIS — K5791 Diverticulosis of intestine, part unspecified, without perforation or abscess with bleeding: Principal | ICD-10-CM | POA: Diagnosis present

## 2018-04-17 DIAGNOSIS — Z8249 Family history of ischemic heart disease and other diseases of the circulatory system: Secondary | ICD-10-CM

## 2018-04-17 DIAGNOSIS — D62 Acute posthemorrhagic anemia: Secondary | ICD-10-CM | POA: Diagnosis not present

## 2018-04-17 DIAGNOSIS — R933 Abnormal findings on diagnostic imaging of other parts of digestive tract: Secondary | ICD-10-CM | POA: Diagnosis not present

## 2018-04-17 DIAGNOSIS — I10 Essential (primary) hypertension: Secondary | ICD-10-CM | POA: Diagnosis present

## 2018-04-17 DIAGNOSIS — M19071 Primary osteoarthritis, right ankle and foot: Secondary | ICD-10-CM | POA: Diagnosis not present

## 2018-04-17 DIAGNOSIS — Z90710 Acquired absence of both cervix and uterus: Secondary | ICD-10-CM

## 2018-04-17 DIAGNOSIS — K921 Melena: Secondary | ICD-10-CM | POA: Diagnosis not present

## 2018-04-17 DIAGNOSIS — D649 Anemia, unspecified: Secondary | ICD-10-CM | POA: Diagnosis not present

## 2018-04-17 DIAGNOSIS — K922 Gastrointestinal hemorrhage, unspecified: Secondary | ICD-10-CM

## 2018-04-17 DIAGNOSIS — M1712 Unilateral primary osteoarthritis, left knee: Secondary | ICD-10-CM | POA: Diagnosis present

## 2018-04-17 DIAGNOSIS — K219 Gastro-esophageal reflux disease without esophagitis: Secondary | ICD-10-CM | POA: Diagnosis not present

## 2018-04-17 DIAGNOSIS — K2971 Gastritis, unspecified, with bleeding: Secondary | ICD-10-CM | POA: Diagnosis present

## 2018-04-17 DIAGNOSIS — K2970 Gastritis, unspecified, without bleeding: Secondary | ICD-10-CM | POA: Diagnosis not present

## 2018-04-17 HISTORY — DX: Gastrointestinal hemorrhage, unspecified: K92.2

## 2018-04-17 LAB — CBC
HEMATOCRIT: 21.5 % — AB (ref 36.0–46.0)
HEMOGLOBIN: 7.2 g/dL — AB (ref 12.0–15.0)
MCH: 25.3 pg — ABNORMAL LOW (ref 26.0–34.0)
MCHC: 33.5 g/dL (ref 30.0–36.0)
MCV: 75.4 fL — AB (ref 80.0–100.0)
NRBC: 0 % (ref 0.0–0.2)
Platelets: 160 10*3/uL (ref 150–400)
RBC: 2.85 MIL/uL — AB (ref 3.87–5.11)
RDW: 14.2 % (ref 11.5–15.5)
WBC: 4.7 10*3/uL (ref 4.0–10.5)

## 2018-04-17 LAB — COMPREHENSIVE METABOLIC PANEL
ALT: 14 U/L (ref 0–44)
ANION GAP: 7 (ref 5–15)
AST: 19 U/L (ref 15–41)
Albumin: 3.4 g/dL — ABNORMAL LOW (ref 3.5–5.0)
Alkaline Phosphatase: 49 U/L (ref 38–126)
BUN: 16 mg/dL (ref 8–23)
CHLORIDE: 105 mmol/L (ref 98–111)
CO2: 27 mmol/L (ref 22–32)
Calcium: 9.6 mg/dL (ref 8.9–10.3)
Creatinine, Ser: 0.78 mg/dL (ref 0.44–1.00)
Glucose, Bld: 155 mg/dL — ABNORMAL HIGH (ref 70–99)
POTASSIUM: 3.4 mmol/L — AB (ref 3.5–5.1)
Sodium: 139 mmol/L (ref 135–145)
Total Bilirubin: 0.6 mg/dL (ref 0.3–1.2)
Total Protein: 5.3 g/dL — ABNORMAL LOW (ref 6.5–8.1)

## 2018-04-17 LAB — MAGNESIUM: Magnesium: 1.8 mg/dL (ref 1.7–2.4)

## 2018-04-17 LAB — PREPARE RBC (CROSSMATCH)

## 2018-04-17 LAB — POC OCCULT BLOOD, ED: Fecal Occult Bld: POSITIVE — AB

## 2018-04-17 LAB — HEMOGLOBIN AND HEMATOCRIT, BLOOD
HEMATOCRIT: 24.6 % — AB (ref 36.0–46.0)
HEMOGLOBIN: 8.3 g/dL — AB (ref 12.0–15.0)

## 2018-04-17 LAB — ABO/RH: ABO/RH(D): O POS

## 2018-04-17 MED ORDER — SODIUM CHLORIDE 0.9% IV SOLUTION
Freq: Once | INTRAVENOUS | Status: AC
  Administered 2018-04-17: 09:00:00 via INTRAVENOUS

## 2018-04-17 MED ORDER — SODIUM CHLORIDE 0.9 % IV BOLUS
1000.0000 mL | Freq: Once | INTRAVENOUS | Status: AC
  Administered 2018-04-17: 1000 mL via INTRAVENOUS

## 2018-04-17 MED ORDER — ACETAMINOPHEN 325 MG PO TABS: 650.0000 mg | ORAL_TABLET | Freq: Four times a day (QID) | ORAL | Status: DC | PRN

## 2018-04-17 MED ORDER — TECHNETIUM TC 99M-LABELED RED BLOOD CELLS IV KIT: 22.0000 | PACK | Freq: Once | INTRAVENOUS | Status: DC | PRN

## 2018-04-17 MED ORDER — PANTOPRAZOLE SODIUM 40 MG IV SOLR
40.0000 mg | Freq: Two times a day (BID) | INTRAVENOUS | Status: DC
  Administered 2018-04-17 (×2): 40 mg via INTRAVENOUS
  Filled 2018-04-17 (×2): qty 40

## 2018-04-17 MED ORDER — SODIUM CHLORIDE 0.9 % IV BOLUS
500.0000 mL | Freq: Once | INTRAVENOUS | Status: AC
  Administered 2018-04-17: 500 mL via INTRAVENOUS

## 2018-04-17 MED ORDER — ACETAMINOPHEN 650 MG RE SUPP: 650.0000 mg | Freq: Four times a day (QID) | RECTAL | Status: DC | PRN

## 2018-04-17 MED ORDER — DICLOFENAC SODIUM 1 % TD GEL
2.0000 g | Freq: Four times a day (QID) | TRANSDERMAL | Status: DC
  Administered 2018-04-17 – 2018-04-20 (×6): 2 g via TOPICAL
  Filled 2018-04-17: qty 100

## 2018-04-17 MED ORDER — POTASSIUM CHLORIDE 10 MEQ/100ML IV SOLN
10.0000 meq | INTRAVENOUS | Status: AC
  Administered 2018-04-17 (×2): 10 meq via INTRAVENOUS
  Filled 2018-04-17 (×2): qty 100

## 2018-04-17 MED ORDER — LACTATED RINGERS IV SOLN
INTRAVENOUS | Status: AC
  Administered 2018-04-17 – 2018-04-18 (×2): via INTRAVENOUS

## 2018-04-17 NOTE — ED Provider Notes (Signed)
[facility] DEPT Provider Note   CSN: [id number] Arrival date & time: 04/17/18  0502     History   Chief Complaint Chief Complaint  Patient presents with  . Rectal Bleeding    HPI Latoya Bennett is a 82 y.o. female with past medical history of GERD, hypertension, diverticulosis who presents to ED for evaluation of ongoing hematochezia.  Patient seen and evaluated here on 04/14/2018 when her symptoms began.  She states that she has had similar episode back in 2007 when she was first diagnosed with diverticulitis.  States that it presented similarly.  At that time, she had her last colonoscopy.  She has not had a colonoscopy since then.  She has not seen a GI doctor since then.  States that every morning, she will have 2-3 episodes of bright red blood when she has a bowel movement.  States that she had lower abdominal pain when her symptoms began 3 days ago but has resolved.  She denies any nausea, vomiting, urinary symptoms, fever, shortness of breath, anticoagulant use. She has been taking the daily potassium as prescribed on 10/9. Of note, patient had a CT scan of the abdomen pelvis done during that visit which showed no acute findings including no diverticulitis.  HPI  Past Medical History:  Diagnosis Date  . Arthritis   . GERD (gastroesophageal reflux disease)   . Hypertension     Patient Active Problem List   Diagnosis Date Noted  . Chronic pain of both ankles 03/13/2017  . Osteoarthritis of both ankles 03/13/2017  . PAC (premature atrial contraction) 11/24/2016  . Essential hypertension 11/24/2016  . Primary osteoarthritis of left knee 11/24/2016  . Tachycardia 11/24/2016    Past Surgical History:  Procedure Laterality Date  . ABDOMINAL HYSTERECTOMY       OB History   None      Home Medications    Prior to Admission medications   Medication Sig Start Date End Date Taking? Authorizing Provider  amLODipine (NORVASC) 10 MG  tablet TAKE 1 TABLET(10 MG) BY MOUTH DAILY 04/02/18  Yes Sagardia, Ines Bloomer, MD  ibuprofen (ADVIL,MOTRIN) 200 MG tablet Take 200 mg by mouth daily as needed (pain).   Yes [provider]  meloxicam (MOBIC) 7.5 MG tablet Take 7.5 mg by mouth daily as needed for pain.  03/04/18  Yes [provider]  potassium chloride SA (K-DUR,KLOR-CON) 20 MEQ tablet Take 1 tablet (20 mEq total) by mouth daily for 5 days. 04/14/18 04/19/18 Yes Maczis, Barth Kirks, PA-C    Family History Family History  Problem Relation Age of Onset  . Hypertension Sister     Social History Social History   Tobacco Use  . Smoking status: Never Smoker  . Smokeless tobacco: Never Used  Substance Use Topics  . Alcohol use: Not on file  . Drug use: Not on file     Allergies   Patient has no known allergies.   Review of Systems Review of Systems  Constitutional: Negative for appetite change, chills and fever.  HENT: Negative for ear pain, rhinorrhea, sneezing and sore throat.   Eyes: Negative for photophobia and visual disturbance.  Respiratory: Negative for cough, chest tightness, shortness of breath and wheezing.   Cardiovascular: Negative for chest pain and palpitations.  Gastrointestinal: Positive for abdominal pain and blood in stool. Negative for constipation, diarrhea, nausea and vomiting.  Genitourinary: Negative for dysuria, hematuria and urgency.  Musculoskeletal: Negative for myalgias.  Skin: Negative for rash.  Neurological: Negative  for dizziness, weakness and light-headedness.     Physical Exam Updated Vital Signs BP 121/64 (BP Location: Right Arm)   Pulse 82   Temp 98.2 F (36.8 C) (Oral)   Resp 18   Ht 4\' 11"  (1.499 m)   Wt 62.6 kg   SpO2 100%   BMI 27.87 kg/m   Physical Exam  Constitutional: She appears well-developed and well-nourished. No distress.  HENT:  Head: Normocephalic and atraumatic.  Nose: Nose normal.  Eyes: Conjunctivae and EOM are normal. Right eye  exhibits no discharge. Left eye exhibits no discharge. No scleral icterus.  Neck: Normal range of motion. Neck supple.  Cardiovascular: Normal rate, regular rhythm, normal heart sounds and intact distal pulses. Exam reveals no gallop and no friction rub.  No murmur heard. Pulmonary/Chest: Effort normal and breath sounds normal. No respiratory distress.  Abdominal: Soft. Bowel sounds are normal. She exhibits no distension. There is no tenderness. There is no guarding.  No abdominal tenderness to palpation.  Genitourinary: Rectal exam shows guaiac positive stool. Rectal exam shows no external hemorrhoid and no internal hemorrhoid.  Genitourinary Comments: Stool is grossly guaiac positive (dark and bright red blood mixed).  Musculoskeletal: Normal range of motion. She exhibits no edema.  Neurological: She is alert. She exhibits normal muscle tone. Coordination normal.  Skin: Skin is warm and dry. No rash noted.  Psychiatric: She has a normal mood and affect.  Nursing note and vitals reviewed.    ED Treatments / Results  Labs (all labs ordered are listed, but only abnormal results are displayed) Labs Reviewed  COMPREHENSIVE METABOLIC PANEL - Abnormal; Notable for the following components:      Result Value   Potassium 3.4 (*)    Glucose, Bld 155 (*)    Total Protein 5.3 (*)    Albumin 3.4 (*)    All other components within normal limits  CBC - Abnormal; Notable for the following components:   RBC 2.85 (*)    Hemoglobin 7.2 (*)    HCT 21.5 (*)    MCV 75.4 (*)    MCH 25.3 (*)    All other components within normal limits  POC OCCULT BLOOD, ED - Abnormal; Notable for the following components:   Fecal Occult Bld POSITIVE (*)    All other components within normal limits  TYPE AND SCREEN  ABO/RH  PREPARE RBC (CROSSMATCH)    EKG None  Radiology No results found.  Procedures Procedures (including critical care time)  CRITICAL CARE Performed by: Delia Heady   Total critical  care time: 45 minutes  Critical care time was exclusive of separately billable procedures and treating other patients.  Critical care was necessary to treat or prevent imminent or life-threatening deterioration.  Critical care was time spent personally by me on the following activities: development of treatment plan with patient and/or surrogate as well as nursing, discussions with consultants, evaluation of patient's response to treatment, examination of patient, obtaining history from patient or surrogate, ordering and performing treatments and interventions, ordering and review of laboratory studies, ordering and review of radiographic studies, pulse oximetry and re-evaluation of patient's condition.   Medications Ordered in ED Medications  0.9 %  sodium chloride infusion (Manually program via Guardrails IV Fluids) (has no administration in time range)  sodium chloride 0.9 % bolus 500 mL (500 mLs Intravenous New Bag/Given 04/17/18 0638)  sodium chloride 0.9 % bolus 1,000 mL (1,000 mLs Intravenous New Bag/Given 04/17/18 0751)     Initial Impression / Assessment  and Plan / ED Course  I have reviewed the triage vital signs and the nursing notes.  Pertinent labs & imaging results that were available during my care of the patient were reviewed by me and considered in my medical decision making (see chart for details).  Clinical Course as of Apr 18 847  Sat Apr 17, 2018  0805 Patient reports some improvement with fluids given.  However, with her history and symptoms, concerning drop in hemoglobin, will transfuse 1 unit of RBCs.  Will need to consult gastroenterology regarding patient.   [HK]  0808 Hemoglobin(!): 7.2 [HK]  0835 Spoke to PA from [facility].  They will consult on the patient and asked that we admit to hospitalist.   [HK]    Clinical Course User Index [HK] Delia Heady, PA-C    82yo F with a past medical history of GERD, hypertension, diverticulosis presents to ED for  ongoing hematochezia.  Seen and evaluated on 10/9 when symptoms began.  At that time she had a hemoglobin of 11.3.  She had a similar episode back in 2007 when she was first diagnosed with diverticulitis. She has not seen GI since 2007.  This is the last time she had a colonoscopy.  She reports 2-4 episodes of bright red blood when she has a bowel movement.  Lower abdominal pain has since resolved gradually.  Denies any vomiting, fever, shortness of breath or anticoagulant use.  On exam there is no abdominal tenderness to palpation.  Patient's systolic blood pressure initially 104.  She reports feeling generally weak.  Hemoglobin today is 7.2.  Stool is grossly guaiac positive.  CMP with improvement in potassium to 3.4.  Due to patient's ongoing symptoms and drop in hemoglobin, 1 unit of RBCs was transfused.  [place] to consult on the patient for further work-up regarding ongoing bleeding.  Hospitalist to admit. Appreciate both of their help for management of this patient. Patient discussed with and seen by my attending, Dr. Darl Householder.  Portions of this note were generated with Lobbyist. Dictation errors may occur despite best attempts at proofreading.   Final Clinical Impressions(s) / ED Diagnoses   Final diagnoses:  Lower GI bleed  Anemia requiring transfusions    ED Discharge Orders    None       Delia Heady, PA-C 04/17/18 5093    Drenda Freeze, MD 04/17/18 1426

## 2018-04-17 NOTE — Progress Notes (Signed)
Pt. Had nuclear medicine GI blood loss study performed. Dr. Jimmye Norman of radiology called and made this RN aware of the results and stated the patient is positive for active hemorrhage. On call NP [place] paged and made aware of results, and that results of test was available in pt. Chart. Will continue to monitor pt. Closely.

## 2018-04-17 NOTE — Progress Notes (Addendum)
Per NT, pt had a large, bright red bloody BM in BSC. MD Florene Glen paged to be made aware. Pt on clear liquid diet- has eaten 1 bowl of beef broth at this point.   Addendum: Per MD Florene Glen, continue to monitor pt on telemetry level of care. But may need transfer to SDU if bright red bleeding continues throughout night. Pt asymptomatic at this time, VSS.

## 2018-04-17 NOTE — Progress Notes (Addendum)
NCM spoke to pt and states she likes the office that she goes to but she does not get to see the same physician. Will provide pt with list of physicians in [place] to call or she can reach out to [facility] for a list of providers to be mailed to home. Pt states she has a [place] for aide/personal care assistant to help in home. Pt may need HH, waiting final recommendations.    Jonnie Finner RN CCM Case Mgmt phone [phone]

## 2018-04-17 NOTE — Consult Note (Signed)
[facility] Gastroenterology Consultation Note  Referring Provider: Dr. Florene Glen [facility]) Primary Care Physician:  Horald Pollen, MD  Reason for Consultation:  hematochezia  HPI: Latoya Bennett is a 82 y.o. female presenting with several days' of recurrent painless hematochezia.  Prior similar episode in 2007, with colonoscopy done by Dr. Ardis Hughs at that time which showed diverticulosis only.  In ED recently (and sent home) for bleeding, with CT scan at that time showing diverticulosis without other root source bleeding identified.  Presented back to ED with ongoing bleeding, with interval drop in hemoglobin 11-->7, now to be admitted to hospital.  No abdominal pain.  Takes NSAIDs occasionally.   Past Medical History:  Diagnosis Date  . Arthritis   . GERD (gastroesophageal reflux disease)   . Hypertension     Past Surgical History:  Procedure Laterality Date  . ABDOMINAL HYSTERECTOMY      Prior to Admission medications   Medication Sig Start Date End Date Taking? Authorizing Provider  amLODipine (NORVASC) 10 MG tablet TAKE 1 TABLET(10 MG) BY MOUTH DAILY 04/02/18  Yes Sagardia, Ines Bloomer, MD  potassium chloride SA (K-DUR,KLOR-CON) 20 MEQ tablet Take 1 tablet (20 mEq total) by mouth daily for 5 days. 04/14/18 04/19/18 Yes Maczis, Barth Kirks, PA-C    Current Facility-Administered Medications  Medication Dose Route Frequency Provider Last Rate Last Dose  . lactated ringers infusion   Intravenous Continuous Elodia Florence., MD 75 mL/hr at 04/17/18 1035    . pantoprazole (PROTONIX) injection 40 mg  40 mg Intravenous Q12H Elodia Florence., MD   40 mg at 04/17/18 1034  . potassium chloride 10 mEq in 100 mL IVPB  10 mEq Intravenous Q1 Hr x 2 Elodia Florence., MD 100 mL/hr at 04/17/18 1036 10 mEq at 04/17/18 1036   Current Outpatient Medications  Medication Sig Dispense Refill  . amLODipine (NORVASC) 10 MG tablet TAKE 1 TABLET(10 MG) BY MOUTH DAILY 30 tablet 1  .  potassium chloride SA (K-DUR,KLOR-CON) 20 MEQ tablet Take 1 tablet (20 mEq total) by mouth daily for 5 days. 5 tablet 0    Allergies as of 04/17/2018  . (No Known Allergies)    Family History  Problem Relation Age of Onset  . Hypertension Sister     Social History   Socioeconomic History  . Marital status: Single    Spouse name: Not on file  . Number of children: Not on file  . Years of education: Not on file  . Highest education level: Not on file  Occupational History  . Occupation: retired  Scientific laboratory technician  . Financial resource strain: Not on file  . Food insecurity:    Worry: Not on file    Inability: Not on file  . Transportation needs:    Medical: Not on file    Non-medical: Not on file  Tobacco Use  . Smoking status: Never Smoker  . Smokeless tobacco: Never Used  Substance and Sexual Activity  . Alcohol use: Not on file  . Drug use: Not on file  . Sexual activity: Not on file  Lifestyle  . Physical activity:    Days per week: Not on file    Minutes per session: Not on file  . Stress: Not on file  Relationships  . Social connections:    Talks on phone: Not on file    Gets together: Not on file    Attends religious service: Not on file    Active member of club or  organization: Not on file    Attends meetings of clubs or organizations: Not on file    Relationship status: Not on file  . Intimate partner violence:    Fear of current or ex partner: Not on file    Emotionally abused: Not on file    Physically abused: Not on file    Forced sexual activity: Not on file  Other Topics Concern  . Not on file  Social History Narrative   Marital status: single; not dating in 2018; moved from [place] to be near sister; not happy in [place].  Lived [place] for 50 years.      Children: no children      Lives: alone with cat      Employment: retired; Optometrist with computers      Tobacco:       Alcohol:       ADLs; independent with ADLs; drives    Review of  Systems: As per HPI, all others negative  Physical Exam: Vital signs in last 24 hours: Temp:  [97.9 F (36.6 C)-98.2 F (36.8 C)] 97.9 F (36.6 C) (10/12 0910) Pulse Rate:  [78-104] 101 (10/12 1110) Resp:  [12-32] 19 (10/12 1110) BP: (101-156)/(53-129) 141/72 (10/12 1110) SpO2:  [99 %-100 %] 100 % (10/12 1110) Weight:  [62.6 kg] 62.6 kg (10/12 0543)   General:   Alert,  Well-developed, well-nourished, pleasant and cooperative in NAD Head:  Normocephalic and atraumatic. Eyes:  Sclera clear, no icterus.   Conjunctiva pink. Ears:  Normal auditory acuity. Nose:  No deformity, discharge,  or lesions. Mouth:  No deformity or lesions.  Oropharynx pink & moist. Neck:  Supple; no masses or thyromegaly. Lungs:  Clear throughout to auscultation.   No wheezes, crackles, or rhonchi. No acute distress. Heart:  Regular rate and rhythm; no murmurs, clicks, rubs,  or gallops. Abdomen:  Soft, nontender and nondistended. No masses, hepatosplenomegaly or hernias noted. Normal bowel sounds, without guarding, and without rebound.     Msk:  Symmetrical without gross deformities. Normal posture. Pulses:  Normal pulses noted. Extremities:  Without clubbing or edema. Neurologic:  Alert and  oriented x4;  grossly normal neurologically. Skin:  Intact without significant lesions or rashes. Cervical Nodes:  No significant cervical adenopathy. Psych:  Alert and cooperative. Normal mood and affect.   Lab Results: Recent Labs    04/17/18 0546  WBC 4.7  HGB 7.2*  HCT 21.5*  PLT 160   BMET Recent Labs    04/17/18 0546  NA 139  K 3.4*  CL 105  CO2 27  GLUCOSE 155*  BUN 16  CREATININE 0.78  CALCIUM 9.6   LFT Recent Labs    04/17/18 0546  PROT 5.3*  ALBUMIN 3.4*  AST 19  ALT 14  ALKPHOS 49  BILITOT 0.6   PT/INR No results for input(s): LABPROT, INR in the last 72 hours.  Studies/Results: No results found.  Impression:  1.  Painless hematochezia.  Recurrent, prior episode 2007  (colonoscopy at that time diverticulosis only).  CT recently without colitis or obvious colonic malignancy.  Suspect recurrent diverticular bleeding. 2.  Acute blood loss anemia.  Plan:  1.  Supportive care (IV fluids, antiemetics as needed, serial CBCs). 2.  If ongoing bleeding, would consider tagged RBC study as next step in management. 3.  If ongoing bleeding and tagged study negative, would consider endoscopy to exclude Gi bleeding, but very much doubt upper GI tract source at the present time. 4.  No plans  for colonoscopy at the present time. 5.  If no bleeding by 5 pm today, would consider clear liquids, otherwise keep NPO and monitor until then. 6.  [facility] will follow.   LOS: 0 days   Maralyn Witherell M  04/17/2018, 11:44 AM  Cell [phone] If no answer or after 5 PM call [phone]

## 2018-04-17 NOTE — ED Notes (Signed)
ED TO INPATIENT HANDOFF REPORT  Name/Age/Gender [facility] 82 y.o. female  Code Status Advance Directive Documentation     Most Recent Value  Type of Advance Directive  Living will, Healthcare Power of Attorney  Pre-existing out of facility DNR order (yellow form or pink MOST form)  -  "MOST" Form in Place?  -      Home/SNF/Other Home  Chief Complaint rectal bleeding  Level of Care/Admitting Diagnosis ED Disposition    ED Disposition Condition [place]: [place] [100102]  Level of Care: Telemetry [5]  Admit to tele based on following criteria: Other see comments  Comments: GI bleed  Diagnosis: GI bleed [465681]  Admitting Physician: Elodia Florence [phone]  Attending Physician: Cephus Slater, A CALDWELL [phone]  Estimated length of stay: past midnight tomorrow  Certification:: I certify this patient will need inpatient services for at least 2 midnights  PT Class (Do Not Modify): Inpatient [101]  PT Acc Code (Do Not Modify): Private [1]       Medical History Past Medical History:  Diagnosis Date  . Arthritis   . GERD (gastroesophageal reflux disease)   . Hypertension     Allergies No Known Allergies  IV Location/Drains/Wounds Patient Lines/Drains/Airways Status   Active Line/Drains/Airways    Name:   Placement date:   Placement time:   Site:   Days:   Peripheral IV Left Arm   -    -    Arm      Peripheral IV 04/17/18 Right Forearm   04/17/18    1003    Forearm   less than 1          Labs/Imaging Results for orders placed or performed during the hospital encounter of 04/17/18 (from the past 48 hour(s))  Comprehensive metabolic panel     Status: Abnormal   Collection Time: 04/17/18  5:46 AM  Result Value Ref Range   Sodium 139 135 - 145 mmol/L   Potassium 3.4 (L) 3.5 - 5.1 mmol/L   Chloride 105 98 - 111 mmol/L   CO2 27 22 - 32 mmol/L   Glucose, Bld 155 (H) 70 - 99 mg/dL   BUN 16 8 - 23  mg/dL   Creatinine, Ser 0.78 0.44 - 1.00 mg/dL   Calcium 9.6 8.9 - 10.3 mg/dL   Total Protein 5.3 (L) 6.5 - 8.1 g/dL   Albumin 3.4 (L) 3.5 - 5.0 g/dL   AST 19 15 - 41 U/L   ALT 14 0 - 44 U/L   Alkaline Phosphatase 49 38 - 126 U/L   Total Bilirubin 0.6 0.3 - 1.2 mg/dL   GFR calc non Af Amer >60 >60 mL/min   GFR calc Af Amer >60 >60 mL/min    Comment: (NOTE) The eGFR has been calculated using the CKD EPI equation. This calculation has not been validated in all clinical situations. eGFR's persistently <60 mL/min signify possible Chronic Kidney Disease.    Anion gap 7 5 - 15    Comment: Performed at [facility], [place] [address], [place], [place] [address]  CBC     Status: Abnormal   Collection Time: 04/17/18  5:46 AM  Result Value Ref Range   WBC 4.7 4.0 - 10.5 K/uL   RBC 2.85 (L) 3.87 - 5.11 MIL/uL   Hemoglobin 7.2 (L) 12.0 - 15.0 g/dL    Comment: Reticulocyte Hemoglobin testing may be clinically indicated, consider ordering this additional test [id number]  HCT 21.5 (L) 36.0 - 46.0 %   MCV 75.4 (L) 80.0 - 100.0 fL   MCH 25.3 (L) 26.0 - 34.0 pg   MCHC 33.5 30.0 - 36.0 g/dL   RDW 14.2 11.5 - 15.5 %   Platelets 160 150 - 400 K/uL   nRBC 0.0 0.0 - 0.2 %    Comment: Performed at [facility], [place] [address], [place], [place] [address]  Type and screen [place]     Status: None (Preliminary result)   Collection Time: 04/17/18  5:46 AM  Result Value Ref Range   ABO/RH(D) O POS    Antibody Screen NEG    Sample Expiration 04/20/2018    Unit Number [id number]    Blood Component Type RED CELLS,LR    Unit division 00    Status of Unit ISSUED    Transfusion Status OK TO TRANSFUSE    Crossmatch Result      Compatible Performed at [facility], [place] [address] [place], [place] [address]    Unit Number [id number]    Blood Component Type RED CELLS,LR    Unit division 00    Status of Unit ALLOCATED     Transfusion Status OK TO TRANSFUSE    Crossmatch Result Compatible    Unit Number [id number]    Blood Component Type RED CELLS,LR    Unit division 00    Status of Unit ALLOCATED    Transfusion Status OK TO TRANSFUSE    Crossmatch Result Compatible   ABO/Rh     Status: None   Collection Time: 04/17/18  5:46 AM  Result Value Ref Range   ABO/RH(D)      O POS Performed at [facility], [place] [address] [place], [place] [address]   POC occult blood, ED     Status: Abnormal   Collection Time: 04/17/18  7:29 AM  Result Value Ref Range   Fecal Occult Bld POSITIVE (A) NEGATIVE  Prepare RBC     Status: None   Collection Time: 04/17/18  7:45 AM  Result Value Ref Range   Order Confirmation      ORDER PROCESSED BY BLOOD BANK Performed at [place] [address] [place], [place] [address]   Magnesium     Status: None   Collection Time: 04/17/18 10:00 AM  Result Value Ref Range   Magnesium 1.8 1.7 - 2.4 mg/dL    Comment: Performed at [facility], [place] [address] [place], [place] [address]   No results found. None  Pending Labs [employer] (From admission, onward)    Start     Ordered   Signed and Held  Comprehensive metabolic panel  Tomorrow morning,   R     Signed and Held   Signed and Held  CBC  Tomorrow morning,   R     Signed and Held   Signed and Held  Hemoglobin and hematocrit, blood  Now then every 8 hours,   R     Signed and Held          Vitals/Pain Today's Vitals   04/17/18 1110 04/17/18 1115 04/17/18 1130 04/17/18 1145  BP: (!) 141/72 (!) 158/88 131/81 (!) 134/92  Pulse: (!) 101 100 98 (!) 101  Resp: _0 (!) 25  Temp:      TempSrc:      SpO2: 100% 100% 100% 100%  Weight:      Height:  PainSc:        Isolation Precautions No active isolations  Medications Medications  acetaminophen (TYLENOL) tablet 650 mg (has no administration in time range)    Or  acetaminophen  (TYLENOL) suppository 650 mg (has no administration in time range)  pantoprazole (PROTONIX) injection 40 mg (40 mg Intravenous Given 04/17/18 1034)  lactated ringers infusion ( Intravenous New Bag/Given 04/17/18 1035)  sodium chloride 0.9 % bolus 500 mL (0 mLs Intravenous Stopped 04/17/18 0857)  sodium chloride 0.9 % bolus 1,000 mL (0 mLs Intravenous Stopped 04/17/18 0857)  0.9 %  sodium chloride infusion (Manually program via Guardrails IV Fluids) ( Intravenous Stopped 04/17/18 1158)  potassium chloride 10 mEq in 100 mL IVPB (10 mEq Intravenous New Bag/Given 04/17/18 1157)    Mobility walks with device

## 2018-04-17 NOTE — H&P (Addendum)
History and Physical    Latoya Bennett ERD:[id number] DOB: March 29, 1936 DOA: 04/17/2018  PCP: Latoya Pollen, MD  Patient coming from: home  I have personally briefly reviewed patient's old medical records in [place]  Chief Complaint: bright red blood per rectum  HPI: Latoya Bennett is Latoya Bennett 82 y.o. female with medical history significant of arthritis, reflux, diverticulosis, hypertension is presenting with bright red blood per rectum over the past 4 days.  She notes her symptoms started this past Wednesday.  She has noticed bright red blood per rectum, mostly in the morning.  She has 2-3 episodes of BRBPR each morning when she has Latoya Bennett BM.  When she has these episodes of bright red blood per rectum, she notices lightheadedness, but otherwise typically does not have lightheadedness or dizziness throughout the day.  She denies any fevers, chills, chest pain, shortness of breath, or abdominal pain.  On chart review, she did c/o some lower abdominal pain when she initially presented to the ED Latoya Bennett few days ago, but this has resolved.  She presented to the emergency department on 10/9 with rectal bleeding.  At that time she was having 2 to 3 episodes of diarrhea Latoya Bennett day.  Her blood pressure was low on that day to 56D systolic when EMS saw her, but this resolved without intervention.  She was discharged with plan to follow-up with gastroenterology.  She had CT only remarkable for diverticulosis.  She has Latoya Bennett history of diverticulosis and Latoya Bennett GI bleed back in 2007.  She takes ibuprofen and meloxicam occasionally, but it was difficult for me to elicit whether she takes these daily.  She lives alone and has Latoya Bennett sister living in the area.  ED Course: Labs, GI consult.  Hospitalist to admit.   Review of Systems: As per HPI otherwise 10 point review of systems negative.   Past Medical History:  Diagnosis Date  . Arthritis   . GERD (gastroesophageal reflux disease)   . Hypertension     Past  Surgical History:  Procedure Laterality Date  . ABDOMINAL HYSTERECTOMY       reports that she has never smoked. She has never used smokeless tobacco. Her alcohol and drug histories are not on file. denies etoh/drugs.  No Known Allergies  Family History  Problem Relation Age of Onset  . Hypertension Sister    Prior to Admission medications   Medication Sig Start Date End Date Taking? Authorizing Provider  amLODipine (NORVASC) 10 MG tablet TAKE 1 TABLET(10 MG) BY MOUTH DAILY 04/02/18  Yes Sagardia, Latoya Bloomer, MD  potassium chloride SA (Latoya Bennett) 20 MEQ tablet Take 1 tablet (20 mEq total) by mouth daily for 5 days. 04/14/18 04/19/18 Yes Latoya Ledger, PA-C    Physical Exam: Vitals:   04/17/18 0845 04/17/18 0849 04/17/18 0900 04/17/18 0910  BP: (!) 127/59 (!) 127/59 (!) 144/84   Pulse: 84 78 100   Resp: (!) 26 15 16    Temp:  98 F (36.7 C)  97.9 F (36.6 C)  TempSrc:  Oral  Oral  SpO2: 100%  100%   Weight:      Height:        Constitutional: NAD, calm, comfortable Vitals:   04/17/18 0845 04/17/18 0849 04/17/18 0900 04/17/18 0910  BP: (!) 127/59 (!) 127/59 (!) 144/84   Pulse: 84 78 100   Resp: (!) 26 15 16    Temp:  98 F (36.7 C)  97.9 F (36.6 C)  TempSrc:  Oral  Oral  SpO2:  100%  100%   Weight:      Height:       Eyes: PERRL, lids and conjunctivae normal ENMT: Mucous membranes are moist.  Neck: normal, supple, no masses, no thyromegaly Respiratory: clear to auscultation bilaterally, no wheezing, no crackles. Normal respiratory effort. No accessory muscle use.  Cardiovascular: mildly tachycardic rate and rhythm, no murmurs / rubs / gallops. No extremity edema. 2+ pedal pulses.  Abdomen: no tenderness, no masses palpated. No hepatosplenomegaly. Bowel sounds positive.  Musculoskeletal: no clubbing / cyanosis. No joint deformity upper and lower extremities. Good ROM, no contractures. Normal muscle tone.  Skin: no rashes, lesions, ulcers. No  induration Neurologic: CN 2-12 grossly intact. Sensation intact. Moving all extremities equally. Psychiatric: Normal judgment and insight. Alert and oriented x 3. Normal mood.   Labs on Admission: I have personally reviewed following labs and imaging studies  CBC: Recent Labs  Lab 04/14/18 1056 04/17/18 0546  WBC 8.7 4.7  NEUTROABS 7.4  --   HGB 11.3* 7.2*  HCT 33.9* 21.5*  MCV 75.3* 75.4*  PLT 161 009   Basic Metabolic Panel: Recent Labs  Lab 04/14/18 1056 04/17/18 0546  NA 142 139  K 2.7* 3.4*  CL 103 105  CO2 26 27  GLUCOSE 202* 155*  BUN 14 16  CREATININE 0.91 0.78  CALCIUM 9.8 9.6  MG 1.8  --    GFR: Estimated Creatinine Clearance: 45.2 mL/min (by C-G formula based on SCr of 0.78 mg/dL). Liver Function Tests: Recent Labs  Lab 04/14/18 1056 04/17/18 0546  AST 21 19  ALT 14 14  ALKPHOS 54 49  BILITOT 0.5 0.6  PROT 6.2* 5.3*  ALBUMIN 3.8 3.4*   Recent Labs  Lab 04/14/18 1056  LIPASE 21   No results for input(s): AMMONIA in the last 168 hours. Coagulation Profile: No results for input(s): INR, PROTIME in the last 168 hours. Cardiac Enzymes: No results for input(s): CKTOTAL, CKMB, CKMBINDEX, TROPONINI in the last 168 hours. BNP (last 3 results) No results for input(s): PROBNP in the last 8760 hours. HbA1C: No results for input(s): HGBA1C in the last 72 hours. CBG: No results for input(s): GLUCAP in the last 168 hours. Lipid Profile: No results for input(s): CHOL, HDL, LDLCALC, TRIG, CHOLHDL, LDLDIRECT in the last 72 hours. Thyroid Function Tests: No results for input(s): TSH, T4TOTAL, FREET4, T3FREE, THYROIDAB in the last 72 hours. Anemia Panel: No results for input(s): VITAMINB12, FOLATE, FERRITIN, TIBC, IRON, RETICCTPCT in the last 72 hours. Urine analysis:    Component Value Date/Time   COLORURINE STRAW (Piero Mustard) 04/14/2018 1521   APPEARANCEUR CLEAR 04/14/2018 1521   LABSPEC 1.015 04/14/2018 1521   PHURINE 7.0 04/14/2018 1521   GLUCOSEU  NEGATIVE 04/14/2018 1521   HGBUR LARGE ([name]) 04/14/2018 1521   BILIRUBINUR NEGATIVE 04/14/2018 1521   BILIRUBINUR negative 11/08/2015 1258   KETONESUR NEGATIVE 04/14/2018 1521   PROTEINUR NEGATIVE 04/14/2018 1521   UROBILINOGEN 0.2 11/08/2015 1258   UROBILINOGEN 1.0 06/20/2009 1449   NITRITE NEGATIVE 04/14/2018 1521   LEUKOCYTESUR NEGATIVE 04/14/2018 1521    Radiological Exams on Admission: No results found.  EKG: Independently reviewed. None obtained in ED, pending collection  Assessment/Plan Active Problems:   GI bleed  GI Bleed  Acute Blood Loss Anemia: likely lower and diverticular given her history of BRBPR and her history of Gayatri Teasdale similar event in 2007, but she notably is taking NSAIDs for arthritis (though it was difficult for me to pin down how frequently she's taking these). GI consult,  appreciate recs NPO 2 PIV in place Trend H/H Receiving 1 unit pRBC, follow and transfuse as appropriate IV PPI BID  Addendum: recurrent BRBPR this PM.  Ordered tagged RBC study.  Vital signs stable.  Will follow results of study and ctm.  Most recent H/H improved after transfusion.    Hypertension: holding amlodipine.    Tachycardia: mild, follow EKG, likely 2/2 ABLA  Hypokalemia: mild, improved from 10/9 when it was 2.7. follow magnesium.  Replete prn.  Osteoarthritis: taking ibuprofen and meloxicam intermittently, I've d/c'd these from her medlist for now.  Recommended APAP and voltaren.  DVT prophylaxis: SCD  Code Status: full  Family Communication: none at bedside Disposition Plan: pending improvement Consults called: GI  Admission status: inpatient given GI bleed with significant anemia requiring transfusion and further workup with GI evaluation.  It is my medical opinion that she will require greater than 2 midnights for further evaluation and treatment given the risk of decompensation.   Fayrene Helper MD [facility] Pager [phone]  If 7PM-7AM, please contact  night-coverage [email or web address] Password [email or web address]  04/17/2018, 9:15 AM

## 2018-04-17 NOTE — ED Triage Notes (Signed)
Pt stated "I was here Wednesday for bleeding from my rectum.  It happens every morning. They did a CT scan but it didn't show anything.  I feel so weak, feel empty stomach like it's tender.  I'm very dizzy."

## 2018-04-18 ENCOUNTER — Encounter (HOSPITAL_COMMUNITY)
Admission: EM | Disposition: A | Discharge status: Home Health | Payer: Self-pay | Source: Home / Self Care | Attending: Internal Medicine

## 2018-04-18 ENCOUNTER — Encounter (HOSPITAL_COMMUNITY): Payer: Self-pay | Admitting: *Deleted

## 2018-04-18 ENCOUNTER — Inpatient Hospital Stay (HOSPITAL_COMMUNITY): Payer: Medicare Other | Admitting: Certified Registered Nurse Anesthetist

## 2018-04-18 DIAGNOSIS — K922 Gastrointestinal hemorrhage, unspecified: Secondary | ICD-10-CM

## 2018-04-18 HISTORY — PX: ESOPHAGOGASTRODUODENOSCOPY (EGD) WITH PROPOFOL: SHX5813

## 2018-04-18 LAB — COMPREHENSIVE METABOLIC PANEL WITH GFR
ALT: 11 U/L (ref 0–44)
AST: 15 U/L (ref 15–41)
Albumin: 2.6 g/dL — ABNORMAL LOW (ref 3.5–5.0)
Alkaline Phosphatase: 37 U/L — ABNORMAL LOW (ref 38–126)
Anion gap: 5 (ref 5–15)
BUN: 8 mg/dL (ref 8–23)
CO2: 26 mmol/L (ref 22–32)
Calcium: 8.9 mg/dL (ref 8.9–10.3)
Chloride: 109 mmol/L (ref 98–111)
Creatinine, Ser: 0.61 mg/dL (ref 0.44–1.00)
GFR calc Af Amer: >60 mL/min
GFR calc non Af Amer: >60 mL/min
Glucose, Bld: 110 mg/dL — ABNORMAL HIGH (ref 70–99)
Potassium: 3.6 mmol/L (ref 3.5–5.1)
Sodium: 140 mmol/L (ref 135–145)
Total Bilirubin: 0.6 mg/dL (ref 0.3–1.2)
Total Protein: 4.1 g/dL — ABNORMAL LOW (ref 6.5–8.1)

## 2018-04-18 LAB — PROTIME-INR
INR: 1.15
Prothrombin Time: 14.6 seconds (ref 11.4–15.2)

## 2018-04-18 LAB — HEMOGLOBIN AND HEMATOCRIT, BLOOD
HCT: 22.8 % — ABNORMAL LOW (ref 36.0–46.0)
HCT: 27.7 % — ABNORMAL LOW (ref 36.0–46.0)
HEMATOCRIT: 31.2 % — AB (ref 36.0–46.0)
HEMOGLOBIN: 7.7 g/dL — AB (ref 12.0–15.0)
HEMOGLOBIN: 10.4 g/dL — AB (ref 12.0–15.0)
Hemoglobin: 9.5 g/dL — ABNORMAL LOW (ref 12.0–15.0)

## 2018-04-18 LAB — PREPARE RBC (CROSSMATCH)

## 2018-04-18 SURGERY — ESOPHAGOGASTRODUODENOSCOPY (EGD) WITH PROPOFOL
Anesthesia: Monitor Anesthesia Care | Laterality: Left

## 2018-04-18 MED ORDER — PROPOFOL 10 MG/ML IV BOLUS
INTRAVENOUS | Status: DC | PRN
  Administered 2018-04-18: 30 mg via INTRAVENOUS

## 2018-04-18 MED ORDER — LIDOCAINE 2% (20 MG/ML) 5 ML SYRINGE
INTRAMUSCULAR | Status: DC | PRN
  Administered 2018-04-18: 40 mg via INTRAVENOUS

## 2018-04-18 MED ORDER — SODIUM CHLORIDE 0.9 % IV SOLN: INTRAVENOUS | Status: DC

## 2018-04-18 MED ORDER — SODIUM CHLORIDE 0.9% IV SOLUTION
Freq: Once | INTRAVENOUS | Status: AC
  Administered 2018-04-18: 03:00:00 via INTRAVENOUS

## 2018-04-18 MED ORDER — PROPOFOL 500 MG/50ML IV EMUL
INTRAVENOUS | Status: DC | PRN
  Administered 2018-04-18: 100 ug/kg/min via INTRAVENOUS

## 2018-04-18 MED ORDER — SODIUM CHLORIDE 0.9 % IV SOLN
8.0000 mg/h | INTRAVENOUS | Status: DC
  Administered 2018-04-18 – 2018-04-20 (×5): 8 mg/h via INTRAVENOUS
  Filled 2018-04-18 (×9): qty 80

## 2018-04-18 MED ORDER — PHENYLEPHRINE 40 MCG/ML (10ML) SYRINGE FOR IV PUSH (FOR BLOOD PRESSURE SUPPORT)
PREFILLED_SYRINGE | INTRAVENOUS | Status: DC | PRN
  Administered 2018-04-18 (×2): 80 ug via INTRAVENOUS

## 2018-04-18 MED ORDER — PROPOFOL 10 MG/ML IV BOLUS
INTRAVENOUS | Status: AC
  Filled 2018-04-18: qty 20

## 2018-04-18 MED ORDER — LACTATED RINGERS IV SOLN
INTRAVENOUS | Status: DC | PRN
  Administered 2018-04-18: 14:00:00 via INTRAVENOUS

## 2018-04-18 MED ORDER — PANTOPRAZOLE SODIUM 40 MG IV SOLR: 40.0000 mg | Freq: Two times a day (BID) | INTRAVENOUS | Status: DC

## 2018-04-18 SURGICAL SUPPLY — 14 items

## 2018-04-18 NOTE — Op Note (Signed)
[facility] Patient Name: [facility] Procedure Date: 04/18/2018 MRN: [id number] Attending MD: Arta Silence , MD Date of Birth: May 17, 1936 CSN: [id number] Age: 82 Admit Type: Inpatient Procedure:                Upper GI endoscopy Indications:              Hematochezia, abnormal tagged RBC study (possible                            activity distal stomach/proximal duodenum) Providers:                Arta Silence, MD, Elmer Ramp. Tilden Dome, RN, Laverda Sorenson, Technician, Dellie Catholic, CRNA Referring MD:             [facility] Medicines:                Monitored Anesthesia Care Complications:            No immediate complications. Estimated Blood Loss:     Estimated blood loss: none. Procedure:                Pre-Anesthesia Assessment:                           - Prior to the procedure, a History and Physical                            was performed, and patient medications and                            allergies were reviewed. The patient's tolerance of                            previous anesthesia was also reviewed. The risks                            and benefits of the procedure and the sedation                            options and risks were discussed with the patient.                            All questions were answered, and informed consent                            was obtained. Prior Anticoagulants: The patient has                            taken previous NSAID medication. ASA Grade                            Assessment: II - A patient with mild systemic  disease. After reviewing the risks and benefits,                            the patient was deemed in satisfactory condition to                            undergo the procedure.                           After obtaining informed consent, the endoscope was                            passed under direct vision. Throughout the                  procedure, the patient's blood pressure, pulse, and                            oxygen saturations were monitored continuously. The                            [id number] ([id number] Olympus adult endoscope was                            introduced through the mouth, and advanced to the                            second part of duodenum. The upper GI endoscopy was                            accomplished without difficulty. The patient                            tolerated the procedure well. Scope In: Scope Out: Findings:      A small hiatal hernia was present.      Mild inflammation was found in the prepyloric region of the stomach.      The exam of the stomach was otherwise normal.      The duodenal bulb, first portion of the duodenum and second portion of       the duodenum were normal.      No old or fresh blood was seen to the extent of our examination. Impression:               - Small hiatal hernia.                           - Gastritis.                           - Normal duodenal bulb, first portion of the                            duodenum and second portion of the duodenum.                           - No source of bleeding (current or  recent or                            remote) in the upper GI tract. Moderate Sedation:      N/A- Per Anesthesia Care Recommendation:           - Return patient to hospital ward for ongoing care.                           - NPO until further notice.                           - Continue present medications.                           - I have discussed case with Dr. Anselm Pancoast                            (Interventional Radiology), who reviewed patient's                            tagged RBC study and feels the tracer accumulation                            could be attributed to site in proximal colon.                           - Will return patient to intermediate care ward. If                            patient has further bleeding, Dr. Anselm Pancoast  has asked                            that he be called directly (number in [place]), at                            which time they would consider angiogram with                            possible embolization.                           - There is unfortunately little else from                            endoscopic perspective that would be very helpful                            in this situation (she has ongoing bleeding and so                            many diverticula that identification of source                            would be exceedingly unlikely).                           -  [facility] will follow. Procedure Code(s):        --- Professional ---                           [phone], Esophagogastroduodenoscopy, flexible,                            transoral; diagnostic, including collection of                            specimen(s) by brushing or washing, when performed                            (separate procedure) Diagnosis Code(s):        --- Professional ---                           K44.9, Diaphragmatic hernia without obstruction or                            gangrene                           K29.70, Gastritis, unspecified, without bleeding                           K92.1, Melena (includes Hematochezia) CPT copyright 2018 American Medical Association. All rights reserved. The codes documented in this report are preliminary and upon coder review may  be revised to meet current compliance requirements. Arta Silence, MD 04/18/2018 2:57:01 PM This report has been signed electronically. Number of Addenda: 0

## 2018-04-18 NOTE — Progress Notes (Signed)
CRITICAL VALUE ALERT  Critical Value:  Hgb 6.4  Date & Time Notied:  04/18/2018 0222  Provider Notified: Kennon Holter NP   Orders Received/Actions taken: Orders to transfuse 2 units of blood obtained.

## 2018-04-18 NOTE — Anesthesia Preprocedure Evaluation (Signed)
Anesthesia Evaluation  Patient identified by MRN, date of birth, ID band Patient awake    Reviewed: Allergy & Precautions, NPO status , Patient's Chart, lab work & pertinent test results  Airway Mallampati: II  TM Distance: >3 FB Neck ROM: Full    Dental no notable dental hx.    Pulmonary neg pulmonary ROS,    Pulmonary exam normal breath sounds clear to auscultation       Cardiovascular hypertension, Pt. on medications Normal cardiovascular exam Rhythm:Regular Rate:Normal     Neuro/Psych negative neurological ROS  negative psych ROS   GI/Hepatic Neg liver ROS, GERD  ,  Endo/Other  negative endocrine ROS  Renal/GU negative Renal ROS  negative genitourinary   Musculoskeletal negative musculoskeletal ROS (+)   Abdominal   Peds negative pediatric ROS (+)  Hematology  (+) anemia ,   Anesthesia Other Findings   Reproductive/Obstetrics negative OB ROS                             Anesthesia Physical Anesthesia Plan  ASA: II  Anesthesia Plan: MAC   Post-op Pain Management:    Induction:   PONV Risk Score and Plan: 2 and Treatment may vary due to age or medical condition  Airway Management Planned: Nasal Cannula  Additional Equipment:   Intra-op Plan:   Post-operative Plan:   Informed Consent: I have reviewed the patients History and Physical, chart, labs and discussed the procedure including the risks, benefits and alternatives for the proposed anesthesia with the patient or authorized representative who has indicated his/her understanding and acceptance.   Dental advisory given  Plan Discussed with: CRNA  Anesthesia Plan Comments:         Anesthesia Quick Evaluation

## 2018-04-18 NOTE — Progress Notes (Signed)
Few episodes of bleeding since admission.  Tagged rbc suggestive blood in distal stomach or proximal duodenum; odd, as patient's BUN has remained consistently in normal range.  Will perform EGD later today.  Continue NPO, transfusion, and supportive care for now.

## 2018-04-18 NOTE — Interval H&P Note (Signed)
History and Physical Interval Note:  04/18/2018 2:18 PM  [facility]  has presented today for surgery, with the diagnosis of blood in stool, abnormal tagged RBC study  The various methods of treatment have been discussed with the patient and family. After consideration of risks, benefits and other options for treatment, the patient has consented to  Procedure(s): ESOPHAGOGASTRODUODENOSCOPY (EGD) WITH PROPOFOL (Left) as a surgical intervention .  The patient's history has been reviewed, patient examined, no change in status, stable for surgery.  I have reviewed the patient's chart and labs.  Questions were answered to the patient's satisfaction.     Javyn Havlin M  Assessment:  1.  GI bleeding (marroon stools with clots). 2.  Abnormal imaging study (tagged rbc activity stomach/proximal small bowel).  Plan:  1.  Endoscopy. 2.  Risks (bleeding, infection, bowel perforation that could require surgery, sedation-related changes in cardiopulmonary systems), benefits (identification and possible treatment of source of symptoms, exclusion of certain causes of symptoms), and alternatives (watchful waiting, radiographic imaging studies, empiric medical treatment) of upper endoscopy (EGD) were explained to patient/family in detail and patient wishes to proceed. 3.  If bleeding persists, and endoscopy is unrevealing, would consider IR consultation.

## 2018-04-18 NOTE — Transfer of Care (Signed)
Immediate Anesthesia Transfer of Care Note  Patient: Latoya Bennett  Procedure(s) Performed: ESOPHAGOGASTRODUODENOSCOPY (EGD) WITH PROPOFOL (Left )  Patient Location: PACU and Endoscopy Unit  Anesthesia Type:MAC  Level of Consciousness: awake, alert , oriented and patient cooperative  Airway & Oxygen Therapy: Patient Spontanous Breathing and Patient connected to nasal cannula oxygen  Post-op Assessment: Report given to RN and Post -op Vital signs reviewed and stable  Post vital signs: Reviewed and stable  Last Vitals:  Vitals Value Taken Time  BP    Temp    Pulse    Resp    SpO2      Last Pain:  Vitals:   04/18/18 1417  TempSrc: Oral  PainSc: 0-No pain         Complications: No apparent anesthesia complications

## 2018-04-18 NOTE — Progress Notes (Signed)
Pt. Had large bright red liquid BM- 750 mL. On call NP Blount paged and made aware of situation and referred to previous note from Dr. Florene Glen to transfer if pt. Were to continue to have bright red BMs in the night. VSS at BP 120/55, HR 84 and O2 at 99 on RA. Pt. Asymptomatic. First unit of blood started. Spoke with Blount on the phone and obtained order to transfer pt. To Stepdown. AC made aware. Will continue to monitor pt. Closely until transferred to [place].

## 2018-04-18 NOTE — Progress Notes (Signed)
PROGRESS NOTE    Latoya Bennett  WUJ:[id number] DOB: Aug 17, 1935 DOA: 04/17/2018 PCP: Horald Pollen, MD   Brief Narrative: Patient is 82 year old female with past medical history of osteoarthritis, GERD, diverticulosis, diverticulitis, hypertension who presented to the emergency department with complaints of bright red blood per rectum for last 4 days.  Patient admitted for further management.  Assessment & Plan:   Active Problems:   Lower GI bleed   GI bleed  GI bleed: Presented with bright blood red per rectum for last 4 days.  History of diverticulosis with diverticulitis.  Patient also taking ibuprofen twice a day for her left knee osteoarthritis.  Started on Protonix drip.  GI already evaluated her and planning for upper endoscopy today. Underwent tagged RBC after admission which suggested active bleeding  in the distal stomach or proximal duodenum.  Acute blood loss anemia: Hemoglobin dropped to 6.4 this morning.  Has been transfused with 2 units of PRBC.  We will continue to monitor her H&H.  Hypertension: Currently blood pressure is stable  Tachycardia: Resolved  Hypokalemia: Supplemented  Left knee osteoarthritis: On ibuprofen twice a day at home.  We will continue lidocaine topical application.   DVT prophylaxis: SCD Code Status: Full Family Communication: None present at the bed side Disposition Plan: Home after resolution of GI bleed   Consultants: GI  Procedures:Tagged RBC scan  Antimicrobials:None  Subjective: Patient seen and examined the bedside this morning.  She is hemodynamically stable.  Denies any abdominal pain, nausea or vomiting.  No active bleeding during my evaluation.  Objective: Vitals:   04/18/18 0835 04/18/18 0850 04/18/18 1129 04/18/18 1200  BP: 122/67 (!) 118/54 124/68   Pulse: 84 81 92   Resp: 12 (!) 22 (!) 25   Temp:  98.7 F (37.1 C) 98.1 F (36.7 C) 98.3 F (36.8 C)  TempSrc:  Oral Oral Oral  SpO2: 100% 96% 100%    Weight:      Height:        Intake/Output Summary (Last 24 hours) at 04/18/2018 1248 Last data filed at 04/18/2018 1129 Gross per 24 hour  Intake 2357.24 ml  Output 750 ml  Net 1607.24 ml   Filed Weights   04/17/18 0543 04/17/18 1357 04/18/18 0445  Weight: 62.6 kg 58.7 kg 57.3 kg    Examination:  General exam: Appears calm and comfortable ,Not in distress,average built HEENT:PERRL,Oral mucosa moist, Ear/Nose normal on gross exam Respiratory system: Bilateral equal air entry, normal vesicular breath sounds, no wheezes or crackles  Cardiovascular system: S1 & S2 heard, RRR. No JVD, murmurs, rubs, gallops or clicks. No pedal edema. Gastrointestinal system: Abdomen is nondistended, soft and nontender. No organomegaly or masses felt. Normal bowel sounds heard. Central nervous system: Alert and oriented. No focal neurological deficits. Extremities: No edema, no clubbing ,no cyanosis, distal peripheral pulses palpable. Skin: No rashes, lesions or ulcers,no icterus ,no pallor MSK: Normal muscle bulk,tone ,power Psychiatry: Judgement and insight appear normal. Mood & affect appropriate.     Data Reviewed: I have personally reviewed following labs and imaging studies  CBC: Recent Labs  Lab 04/14/18 1056 04/17/18 0546 04/17/18 1435 04/18/18 0117 04/18/18 0811  WBC 8.7 4.7  --  4.6  --   NEUTROABS 7.4  --   --   --   --   HGB 11.3* 7.2* 8.3* 6.4* 7.7*  HCT 33.9* 21.5* 24.6* 19.0* 22.8*  MCV 75.3* 75.4*  --  76.3*  --   PLT 161 160  --  125*  --  Basic Metabolic Panel: Recent Labs  Lab 04/14/18 1056 04/17/18 0546 04/17/18 1000 04/18/18 0117  NA 142 139  --  140  K 2.7* 3.4*  --  3.6  CL 103 105  --  109  CO2 26 27  --  26  GLUCOSE 202* 155*  --  110*  BUN 14 16  --  8  CREATININE 0.91 0.78  --  0.61  CALCIUM 9.8 9.6  --  8.9  MG 1.8  --  1.8  --    GFR: Estimated Creatinine Clearance: 43.2 mL/min (by C-G formula based on SCr of 0.61 mg/dL). Liver Function  Tests: Recent Labs  Lab 04/14/18 1056 04/17/18 0546 04/18/18 0117  AST 21 19 15   ALT 14 14 11   ALKPHOS 54 49 37*  BILITOT 0.5 0.6 0.6  PROT 6.2* 5.3* 4.1*  ALBUMIN 3.8 3.4* 2.6*   Recent Labs  Lab 04/14/18 1056  LIPASE 21   No results for input(s): AMMONIA in the last 168 hours. Coagulation Profile: No results for input(s): INR, PROTIME in the last 168 hours. Cardiac Enzymes: No results for input(s): CKTOTAL, CKMB, CKMBINDEX, TROPONINI in the last 168 hours. BNP (last 3 results) No results for input(s): PROBNP in the last 8760 hours. HbA1C: No results for input(s): HGBA1C in the last 72 hours. CBG: No results for input(s): GLUCAP in the last 168 hours. Lipid Profile: No results for input(s): CHOL, HDL, LDLCALC, TRIG, CHOLHDL, LDLDIRECT in the last 72 hours. Thyroid Function Tests: No results for input(s): TSH, T4TOTAL, FREET4, T3FREE, THYROIDAB in the last 72 hours. Anemia Panel: No results for input(s): VITAMINB12, FOLATE, FERRITIN, TIBC, IRON, RETICCTPCT in the last 72 hours. Sepsis Labs: No results for input(s): PROCALCITON, LATICACIDVEN in the last 168 hours.  No results found for this or any previous visit (from the past 240 hour(s)).       Radiology Studies: Nm Gi Blood Loss  Result Date: 04/17/2018 CLINICAL DATA:  Bloody bowel movements earlier today. EXAM: NUCLEAR MEDICINE GASTROINTESTINAL BLEEDING SCAN TECHNIQUE: Sequential abdominal images were obtained following intravenous administration of Tc-35m labeled red blood cells. RADIOPHARMACEUTICALS:  22 mCi Tc-36m pertechnetate in-vitro labeled red cells. COMPARISON:  CT abdomen and pelvis 04/14/2018 FINDINGS: 1 hour static and cine images obtained demonstrate tracer accumulation beginning in the upper mid abdomen and extending towards the right. This is consistent with gastrointestinal bleeding originating in the distal stomach or proximal duodenum. Normal blood pool activity is identified. IMPRESSION: Positive  examination for active gastrointestinal bleeding likely originating in the distal stomach or proximal duodenum. These results were called by telephone at the time of interpretation on 04/17/2018 at 11:37 pm to RN Verdis Frederickson , who verbally acknowledged these results. Electronically Signed   By: Lucienne Capers M.D.   On: 04/17/2018 23:37        Scheduled Meds: . diclofenac sodium  2 g Topical QID  . [START ON 04/21/2018] pantoprazole  40 mg Intravenous Q12H   Continuous Infusions: . sodium chloride    . pantoprozole (PROTONIX) infusion 8 mg/hr (04/18/18 0933)     LOS: 1 day    Time spent: 25 mins.More than 50% of that time was spent in counseling and/or coordination of care.      Shelly Coss, MD [facility] Pager [phone]  If 7PM-7AM, please contact night-coverage [email or web address] Password [email or web address] 04/18/2018, 12:48 PM

## 2018-04-19 LAB — TYPE AND SCREEN
ABO/RH(D): O POS
Antibody Screen: NEGATIVE
UNIT DIVISION: 0
UNIT DIVISION: 0
Unit division: 0

## 2018-04-19 LAB — CBC
HCT: 19 % — ABNORMAL LOW (ref 36.0–46.0)
HEMOGLOBIN: 6.4 g/dL — AB (ref 12.0–15.0)
MCH: 25.7 pg — AB (ref 26.0–34.0)
MCHC: 33.7 g/dL (ref 30.0–36.0)
MCV: 76.3 fL — AB (ref 80.0–100.0)
PLATELETS: 125 10*3/uL — AB (ref 150–400)
RBC: 2.49 MIL/uL — ABNORMAL LOW (ref 3.87–5.11)
RDW: 14.6 % (ref 11.5–15.5)
WBC: 4.6 10*3/uL (ref 4.0–10.5)
nRBC: 0 % (ref 0.0–0.2)

## 2018-04-19 LAB — BPAM RBC
BLOOD PRODUCT EXPIRATION DATE: 201911082359
BLOOD PRODUCT EXPIRATION DATE: 201911082359
Blood Product Expiration Date: 201911082359
ISSUE DATE / TIME: 201910130304
ISSUE DATE / TIME: 201910120836
ISSUE DATE / TIME: 201910130828
UNIT TYPE AND RH: 5100
Unit Type and Rh: 5100
Unit Type and Rh: 5100

## 2018-04-19 LAB — BASIC METABOLIC PANEL
ANION GAP: 9 (ref 5–15)
BUN: 10 mg/dL (ref 8–23)
CALCIUM: 8.6 mg/dL — AB (ref 8.9–10.3)
CO2: 24 mmol/L (ref 22–32)
CREATININE: 0.69 mg/dL (ref 0.44–1.00)
Chloride: 104 mmol/L (ref 98–111)
GFR calc non Af Amer: >60 mL/min (ref >60)
GLUCOSE: 92 mg/dL (ref 70–99)
Potassium: 3.3 mmol/L — ABNORMAL LOW (ref 3.5–5.1)
Sodium: 137 mmol/L (ref 135–145)

## 2018-04-19 LAB — HEMOGLOBIN AND HEMATOCRIT, BLOOD
HCT: 26.5 % — ABNORMAL LOW (ref 36.0–46.0)
Hemoglobin: 9.1 g/dL — ABNORMAL LOW (ref 12.0–15.0)

## 2018-04-19 MED ORDER — POTASSIUM CHLORIDE 10 MEQ/100ML IV SOLN
10.0000 meq | INTRAVENOUS | Status: AC
  Administered 2018-04-19 (×3): 10 meq via INTRAVENOUS
  Filled 2018-04-19 (×2): qty 100

## 2018-04-19 MED ORDER — BOOST / RESOURCE BREEZE PO LIQD CUSTOM
1.0000 | ORAL | Status: DC
  Administered 2018-04-20: 1 via ORAL

## 2018-04-19 MED ORDER — ADULT MULTIVITAMIN W/MINERALS CH
1.0000 | ORAL_TABLET | Freq: Every day | ORAL | Status: DC
  Administered 2018-04-20: 1 via ORAL
  Filled 2018-04-19: qty 1

## 2018-04-19 MED ORDER — ENSURE ENLIVE PO LIQD
237.0000 mL | ORAL | Status: DC
  Administered 2018-04-19: 237 mL via ORAL

## 2018-04-19 MED ORDER — POTASSIUM CHLORIDE 10 MEQ/100ML IV SOLN
INTRAVENOUS | Status: AC
  Administered 2018-04-19: 10 meq
  Filled 2018-04-19: qty 200

## 2018-04-19 NOTE — Care Management Note (Signed)
Case Management Note  Patient Details  Name: Latoya Bennett MRN: [id number] Date of Birth: 07-02-1936  Subjective/Objective:                  Active rectal bleeding hgb 9.1 rec'd one unit prbc Action/Plan:   Expected Discharge Date:                  Expected Discharge Plan:  Home/Self Care  In-House Referral:  NA  Discharge planning Services  CM Consult  Post Acute Care Choice:  NA Choice offered to:  NA  DME Arranged:  N/A DME Agency:  NA  HH Arranged:  NA HH Agency:  NA  Status of Service:  Completed, signed off  If discussed at [place] of Stay Meetings, dates discussed:    Additional Comments:  Leeroy Cha, RN 04/19/2018, 11:35 AM

## 2018-04-19 NOTE — Progress Notes (Signed)
PROGRESS NOTE    Latoya Bennett  DDU:[id number] DOB: [birth year] DOA: 04/17/2018 PCP: Horald Pollen, MD   Brief Narrative: Patient is 82 year old female with past medical history of osteoarthritis, GERD, diverticulosis, diverticulitis, hypertension who presented to the emergency department with complaints of bright red blood per rectum for last 4 days.  Patient admitted for further management.  Assessment & Plan:   Active Problems:   Lower GI bleed   GI bleed  GI bleed: Presented with bright blood red per rectum for 4 days.  History of diverticulosis with diverticulitis.  Patient also taking ibuprofen twice a day for her left knee osteoarthritis.  Started on Protonix drip.  GI already evaluated her and she underwent  upper endoscopy on 04/18/18. Endoscopy just showed a small hiatal hernia, gastritis, normal duodenal bulb, first portion of the duodenum and second portion of the duodenum.  No source of bleeding identified. She had nderwent tagged RBC after admission which suggested active bleeding  in the distal stomach or proximal duodenum. GI recommending n.p.o. until further notice.  I think she can be started on clear liquid diet.  I will leave this decision up to GI.  Continue Protonix drip.  Acute blood loss anemia:   Has been transfused with 2 units of PRBC.  H and H has remained stable.  Hypertension: Currently blood pressure is stable  Tachycardia: Resolved  Hypokalemia: Supplemented  Left knee osteoarthritis: On ibuprofen twice a day at home.  We will continue lidocaine topical application.Tylenol   DVT prophylaxis: SCD Code Status: Full Family Communication: None present at the bed side Disposition Plan: Home after resolution of GI bleed   Consultants: GI  Procedures:Tagged RBC scan  Antimicrobials:None  Subjective: Patient seen and examined the bedside this morning.  She is hemodynamically stable.  Denies any abdominal pain, nausea or vomiting.  No  more significant episodes of bleeding.  H&H is stable.  Wants to eat.  Objective: Vitals:   04/19/18 0400 04/19/18 0409 04/19/18 0500 04/19/18 0800  BP: (!) 115/52   (!) 115/46  Pulse: 71   72  Resp: 17   (!) 24  Temp:  98.1 F (36.7 C)    TempSrc:      SpO2: 100%   100%  Weight:   57.2 kg   Height:        Intake/Output Summary (Last 24 hours) at 04/19/2018 1049 Last data filed at 04/19/2018 2376 Gross per 24 hour  Intake 758.79 ml  Output 300 ml  Net 458.79 ml   Filed Weights   04/17/18 1357 04/18/18 0445 04/19/18 0500  Weight: 58.7 kg 57.3 kg 57.2 kg    Examination:  General exam: Appears calm and comfortable ,Not in distress,average built HEENT:PERRL,Oral mucosa moist, Ear/Nose normal on gross exam Respiratory system: Bilateral equal air entry, normal vesicular breath sounds, no wheezes or crackles  Cardiovascular system: S1 & S2 heard, RRR. No JVD, murmurs, rubs, gallops or clicks. No pedal edema. Gastrointestinal system: Abdomen is nondistended, soft and nontender. No organomegaly or masses felt. Normal bowel sounds heard. Central nervous system: Alert and oriented. No focal neurological deficits. Extremities: No edema, no clubbing ,no cyanosis, distal peripheral pulses palpable. Skin: No rashes, lesions or ulcers,no icterus ,no pallor MSK: Normal muscle bulk,tone ,power Psychiatry: Judgement and insight appear normal. Mood & affect appropriate.     Data Reviewed: I have personally reviewed following labs and imaging studies  CBC: Recent Labs  Lab 04/14/18 1056 04/17/18 0546  04/18/18 0117 04/18/18 0811 04/18/18 1338  04/18/18 2013 04/19/18 0420  WBC 8.7 4.7  --  4.6  --   --   --   --   NEUTROABS 7.4  --   --   --   --   --   --   --   HGB 11.3* 7.2*   < > 6.4* 7.7* 9.5* 10.4* 9.1*  HCT 33.9* 21.5*   < > 19.0* 22.8* 27.7* 31.2* 26.5*  MCV 75.3* 75.4*  --  76.3*  --   --   --   --   PLT 161 160  --  125*  --   --   --   --    < > = values in this  interval not displayed.   Basic Metabolic Panel: Recent Labs  Lab 04/14/18 1056 04/17/18 0546 04/17/18 1000 04/18/18 0117 04/19/18 0420  NA 142 139  --  140 137  K 2.7* 3.4*  --  3.6 3.3*  CL 103 105  --  109 104  CO2 26 27  --  26 24  GLUCOSE 202* 155*  --  110* 92  BUN 14 16  --  8 10  CREATININE 0.91 0.78  --  0.61 0.69  CALCIUM 9.8 9.6  --  8.9 8.6*  MG 1.8  --  1.8  --   --    GFR: Estimated Creatinine Clearance: 43.2 mL/min (by C-G formula based on SCr of 0.69 mg/dL). Liver Function Tests: Recent Labs  Lab 04/14/18 1056 04/17/18 0546 04/18/18 0117  AST 21 19 15   ALT 14 14 11   ALKPHOS 54 49 37*  BILITOT 0.5 0.6 0.6  PROT 6.2* 5.3* 4.1*  ALBUMIN 3.8 3.4* 2.6*   Recent Labs  Lab 04/14/18 1056  LIPASE 21   No results for input(s): AMMONIA in the last 168 hours. Coagulation Profile: Recent Labs  Lab 04/18/18 1526  INR 1.15   Cardiac Enzymes: No results for input(s): CKTOTAL, CKMB, CKMBINDEX, TROPONINI in the last 168 hours. BNP (last 3 results) No results for input(s): PROBNP in the last 8760 hours. HbA1C: No results for input(s): HGBA1C in the last 72 hours. CBG: No results for input(s): GLUCAP in the last 168 hours. Lipid Profile: No results for input(s): CHOL, HDL, LDLCALC, TRIG, CHOLHDL, LDLDIRECT in the last 72 hours. Thyroid Function Tests: No results for input(s): TSH, T4TOTAL, FREET4, T3FREE, THYROIDAB in the last 72 hours. Anemia Panel: No results for input(s): VITAMINB12, FOLATE, FERRITIN, TIBC, IRON, RETICCTPCT in the last 72 hours. Sepsis Labs: No results for input(s): PROCALCITON, LATICACIDVEN in the last 168 hours.  No results found for this or any previous visit (from the past 240 hour(s)).       Radiology Studies: Nm Gi Blood Loss  Result Date: 04/17/2018 CLINICAL DATA:  Bloody bowel movements earlier today. EXAM: NUCLEAR MEDICINE GASTROINTESTINAL BLEEDING SCAN TECHNIQUE: Sequential abdominal images were obtained following  intravenous administration of Tc-44m labeled red blood cells. RADIOPHARMACEUTICALS:  22 mCi Tc-48m pertechnetate in-vitro labeled red cells. COMPARISON:  CT abdomen and pelvis 04/14/2018 FINDINGS: 1 hour static and cine images obtained demonstrate tracer accumulation beginning in the upper mid abdomen and extending towards the right. This is consistent with gastrointestinal bleeding originating in the distal stomach or proximal duodenum. Normal blood pool activity is identified. IMPRESSION: Positive examination for active gastrointestinal bleeding likely originating in the distal stomach or proximal duodenum. These results were called by telephone at the time of interpretation on 04/17/2018 at 11:37 pm to RN Verdis Frederickson , who verbally acknowledged  these results. Electronically Signed   By: Lucienne Capers M.D.   On: 04/17/2018 23:37        Scheduled Meds: . diclofenac sodium  2 g Topical QID  . [START ON 04/21/2018] pantoprazole  40 mg Intravenous Q12H   Continuous Infusions: . pantoprozole (PROTONIX) infusion 8 mg/hr (04/19/18 0616)  . potassium chloride       LOS: 2 days    Time spent: 25 mins.More than 50% of that time was spent in counseling and/or coordination of care.      Shelly Coss, MD [facility] Pager [phone]  If 7PM-7AM, please contact night-coverage [email or web address] Password [email or web address] 04/19/2018, 10:49 AM

## 2018-04-19 NOTE — Progress Notes (Signed)
Subjective: Some blood last night, none since. No abdominal pain.  Objective: Vital signs in last 24 hours: Temp:  [97.6 F (36.4 C)-98.4 F (36.9 C)] 97.6 F (36.4 C) (10/14 0800) Pulse Rate:  [71-98] 72 (10/14 0800) Resp:  [14-25] 24 (10/14 0800) BP: (93-150)/(46-99) 115/46 (10/14 0800) SpO2:  [96 %-100 %] 100 % (10/14 0800) Weight:  [57.2 kg] 57.2 kg (10/14 0500) Weight change: -1.5 kg Last BM Date: 04/18/18  PE: GEN:  NAD ABD:  Soft, non tender  Lab Results: CBC    Component Value Date/Time   WBC 4.6 04/18/2018 0117   RBC 2.49 (L) 04/18/2018 0117   HGB 9.1 (L) 04/19/2018 0420   HGB 13.6 10/29/2016 1139   HCT 26.5 (L) 04/19/2018 0420   HCT 40.5 10/29/2016 1139   PLT 125 (L) 04/18/2018 0117   PLT 189 10/29/2016 1139   MCV 76.3 (L) 04/18/2018 0117   MCV 74 (L) 10/29/2016 1139   MCH 25.7 (L) 04/18/2018 0117   MCHC 33.7 04/18/2018 0117   RDW 14.6 04/18/2018 0117   RDW 14.8 10/29/2016 1139   LYMPHSABS 1.1 04/14/2018 1056   LYMPHSABS 1.5 10/29/2016 1139   MONOABS 0.2 04/14/2018 1056   EOSABS 0.0 04/14/2018 1056   EOSABS 0.0 10/29/2016 1139   BASOSABS 0.0 04/14/2018 1056   BASOSABS 0.0 10/29/2016 1139   Assessment:  1.  GI bleeding.  Negative endoscopy.  Suspect proximal colonic diverticular bleeding.  Resolve.d 2.  Acute blood loss anemia, from #1 above.  Plan:  1. Clear liquids. 2.  If further bleeding, call IR to discuss possible angiogram. 3.  [facility] will follow.   Landry Dyke 04/19/2018, 11:14 AM   Cell [phone] If no answer or after 5 PM call [phone]

## 2018-04-19 NOTE — Progress Notes (Signed)
Initial Nutrition Assessment  DOCUMENTATION CODES:   Non-severe (moderate) malnutrition in context of chronic illness  INTERVENTION:  - Will order Boost Breeze once/day, this supplement provides 250 kcal and 9 grams of protein. - Will order Ensure Enlive once/day, this supplement provides 350 kcal and 20 grams of protein. - Will order daily multivitamin with minerals.  - Continue to encourage PO intakes. - Diet advancement as medically feasible. - Will provide diet education, if appropriate, at follow-up.    NUTRITION DIAGNOSIS:   Moderate Malnutrition related to chronic illness(osteoarthritis) as evidenced by mild fat depletion, moderate muscle depletion.  GOAL:   Patient will meet greater than or equal to 90% of their needs  MONITOR:   PO intake, Diet advancement, Weight trends, Labs, I & O's, Supplement acceptance  REASON FOR ASSESSMENT:   Malnutrition Screening Tool  ASSESSMENT:   82 year old female with past medical history of osteoarthritis, GERD, diverticulosis, diverticulitis, and HTN. She presented to the ED with complaints of bright red blood per rectum for 4 days.  BMI indicates overweight status, appropriate for age. Diet was advanced from NPO to CLD today at 11:15 AM. Patient reports having broth and juice after that time and saved iced tea and jello for later. She reports that she was told that she will likely be able to have FLD later today.  Patient reports abdominal pain PTA but that it has been improving and is much better today. The last time she ate solid food PTA was 2 days ago. Unable to obtain much other nutrition-related information at this time as patient prefers to talk about other topics. She reports last episode of diverticulitis was in 2007 and that PTA it felt like she was starting to have "a flare" so she tried consuming only clear liquids for a day but then had to come to the hospital. She is interested in diet education at follow-up.   Per chart  review, current weight is 126 lb and weight on 6/28 was 135 lb. This indicates 9 lb weight loss (6.7% body weight) in the past 3.5-4 months; this is not significant for time frame.    Medications reviewed Labs reviewed; K: 3.3 mmol/L, Ca: 8.6 mg/dL.     NUTRITION - FOCUSED PHYSICAL EXAM:    Most Recent Value  Orbital Region  No depletion  Upper Arm Region  Mild depletion  Thoracic and Lumbar Region  Unable to assess  Buccal Region  No depletion  Temple Region  Mild depletion  Clavicle Bone Region  Moderate depletion  Clavicle and Acromion Bone Region  Moderate depletion  Scapular Bone Region  Unable to assess  Dorsal Hand  Mild depletion  Patellar Region  Unable to assess  Anterior Thigh Region  Unable to assess  Posterior Calf Region  Unable to assess  Edema (RD Assessment)  Unable to assess  Hair  Reviewed  Eyes  Reviewed  Mouth  Reviewed  Skin  Reviewed  Nails  Reviewed       Diet Order:   Diet Order            Diet clear liquid Room service appropriate? Yes; Fluid consistency: Thin  Diet effective now              EDUCATION NEEDS:   Not appropriate for education at this time  Skin:  Skin Assessment: Reviewed RN Assessment  Last BM:  10/13  Height:   Ht Readings from Last 1 Encounters:  04/18/18 4\' 11"  (1.499 m)    Weight:  Wt Readings from Last 1 Encounters:  04/19/18 57.2 kg    Ideal Body Weight:  42.91 kg  BMI:  Body mass index is 25.47 kg/m.  Estimated Nutritional Needs:   Kcal:  1430-1600  Protein:  60-70 grams  Fluid:  >/= 1.6 L/day     Jarome Matin, MS, RD, LDN, [facility] Inpatient Clinical Dietitian Pager # [phone] After hours/weekend pager # [phone]

## 2018-04-20 LAB — MRSA PCR SCREENING: MRSA BY PCR: NEGATIVE

## 2018-04-20 LAB — HEMOGLOBIN AND HEMATOCRIT, BLOOD
HEMATOCRIT: 26.2 % — AB (ref 36.0–46.0)
HEMOGLOBIN: 8.9 g/dL — AB (ref 12.0–15.0)

## 2018-04-20 MED ORDER — PANTOPRAZOLE SODIUM 40 MG PO TBEC: 40.0000 mg | DELAYED_RELEASE_TABLET | Freq: Every day | ORAL | Status: DC

## 2018-04-20 MED ORDER — DICLOFENAC SODIUM 1 % TD GEL: 2.0000 g | Freq: Four times a day (QID) | TRANSDERMAL | 1 refills | Status: DC

## 2018-04-20 NOTE — Progress Notes (Signed)
Subjective: No further bleeding.  Objective: Vital signs in last 24 hours: Temp:  [97.6 F (36.4 C)-98.4 F (36.9 C)] 97.6 F (36.4 C) (10/15 0805) Pulse Rate:  [69-95] 72 (10/15 0400) Resp:  [16-21] 19 (10/15 0400) BP: (90-134)/(39-94) 104/69 (10/15 0400) SpO2:  [99 %-100 %] 100 % (10/15 0400) Weight change:  Last BM Date: 04/18/18  PE: GEN:  NAD ABD:  Soft, non-tender  Lab Results: CBC    Component Value Date/Time   WBC 4.6 04/18/2018 0117   RBC 2.49 (L) 04/18/2018 0117   HGB 8.9 (L) 04/20/2018 0345   HGB 13.6 10/29/2016 1139   HCT 26.2 (L) 04/20/2018 0345   HCT 40.5 10/29/2016 1139   PLT 125 (L) 04/18/2018 0117   PLT 189 10/29/2016 1139   MCV 76.3 (L) 04/18/2018 0117   MCV 74 (L) 10/29/2016 1139   MCH 25.7 (L) 04/18/2018 0117   MCHC 33.7 04/18/2018 0117   RDW 14.6 04/18/2018 0117   RDW 14.8 10/29/2016 1139   LYMPHSABS 1.1 04/14/2018 1056   LYMPHSABS 1.5 10/29/2016 1139   MONOABS 0.2 04/14/2018 1056   EOSABS 0.0 04/14/2018 1056   EOSABS 0.0 10/29/2016 1139   BASOSABS 0.0 04/14/2018 1056   BASOSABS 0.0 10/29/2016 1139    Assessment:  1.  GI bleeding.  Negative endoscopy.  Suspect proximal colonic diverticular bleeding, resolved. 2.  Acute blood loss anemia, from #1 above.  Plan:  1.  Advance diet. 2.  No PPI necessary. 3.  OK to discharge home today from GI perspective; discussed with Dr. Tawanna Solo; will need outpatient GI follow-up either with [facility] (had colonoscopy by them 2007, sounds like she made another appt with them) or [facility]. 4.  [facility] will sign-off; please call with questions; thanks for the consultation.   Landry Dyke 04/20/2018, 10:39 AM   Cell [phone] If no answer or after 5 PM call [phone]

## 2018-04-20 NOTE — Discharge Summary (Signed)
Physician Discharge Summary  [place] QIW:[id number] DOB: 1935-10-02 DOA: 04/17/2018  PCP: Latoya Pollen, MD  Admit date: 04/17/2018 Discharge date: 04/20/2018  Admitted From: Home Disposition:  Home  Discharge Condition:Stable CODE STATUS:FULL Diet recommendation: Heart Healthy  Brief/Interim Summary: Patient is 82 year old female with past medical history of osteoarthritis, GERD, diverticulosis, diverticulitis, hypertension who presented to the emergency department with complaints of bright red blood per rectum for last 4 days.  Patient admitted for further management.  Patient underwent upper GI endoscopy which did not show any clear source of bleeding.  Diverticular source was suspected.  She was transfused with  2 units of PRBC with improvement in her hemoglobin.  She has tolerated soft diet today and is stable for discharge today to home.  She will follow-up with her own gastroenterologist as an outpatient.  Following problems were addressed during her hospitalization:   GI bleed: Presented with bright blood red per rectum for 4 days.  History of diverticulosis with diverticulitis.  Patient also taking ibuprofen twice a day for her left knee osteoarthritis.  Started on Protonix drip.  GI already evaluated her and she underwent  upper endoscopy on 04/18/18. Endoscopy just showed a small hiatal hernia, gastritis, normal duodenal bulb, first portion of the duodenum and second portion of the duodenum.  No source of bleeding identified. She had underwent tagged RBC after admission which suggested active bleeding  in the distal stomach or proximal duodenum but likely that was an error. She tolerated soft diet today.  Acute blood loss anemia:   Has been transfused with 2 units of PRBC.  H and H has remained stable.  Hypertension: Currently blood pressure is stable  Tachycardia: Resolved  Hypokalemia: Supplemented  Left knee osteoarthritis: On ibuprofen twice a  day at home. Tylenol/topical anesthetics recommended. Advised to stop taking NSAIDs on regular basis.   Discharge Diagnoses:  Active Problems:   Lower GI bleed   GI bleed    Discharge Instructions  Discharge Instructions    Diet - low sodium heart healthy   Complete by:  As directed    Discharge instructions   Complete by:  As directed    1) Take soft diet for next 3-5 days. 2) Follow up with your PCP as an outpatient.Do a CBC test during the follow up. 3) Follow up with your gastroenterologist in the next appointment.   Increase activity slowly   Complete by:  As directed      Allergies as of 04/20/2018   No Known Allergies     Medication List    STOP taking these medications   potassium chloride SA 20 MEQ tablet Commonly known as:  K-DUR,KLOR-CON     TAKE these medications   amLODipine 10 MG tablet Commonly known as:  NORVASC TAKE 1 TABLET(10 MG) BY MOUTH DAILY      Follow-up Information    Latoya Pollen, MD. Schedule an appointment as soon as possible for a visit in 1 week(s).   Specialty:  Internal Medicine Contact information: [place] [address] [phone]          No Known Allergies  Consultations: GI  Procedures/Studies: Nm Gi Blood Loss  Result Date: 04/17/2018 CLINICAL DATA:  Bloody bowel movements earlier today. EXAM: NUCLEAR MEDICINE GASTROINTESTINAL BLEEDING SCAN TECHNIQUE: Sequential abdominal images were obtained following intravenous administration of Tc-18m labeled red blood cells. RADIOPHARMACEUTICALS:  22 mCi Tc-69m pertechnetate in-vitro labeled red cells. COMPARISON:  CT abdomen and pelvis 04/14/2018 FINDINGS: 1 hour static  and cine images obtained demonstrate tracer accumulation beginning in the upper mid abdomen and extending towards the right. This is consistent with gastrointestinal bleeding originating in the distal stomach or proximal duodenum. Normal blood pool activity is identified. IMPRESSION:  Positive examination for active gastrointestinal bleeding likely originating in the distal stomach or proximal duodenum. These results were called by telephone at the time of interpretation on 04/17/2018 at 11:37 pm to RN Verdis Frederickson , who verbally acknowledged these results. Electronically Signed   By: Lucienne Capers M.D.   On: 04/17/2018 23:37   Ct Abdomen Pelvis W Contrast  Result Date: 04/14/2018 CLINICAL DATA:  Rectal bleeding this morning. History of diverticulitis. EXAM: CT ABDOMEN AND PELVIS WITH CONTRAST TECHNIQUE: Multidetector CT imaging of the abdomen and pelvis was performed using the standard protocol following bolus administration of intravenous contrast. CONTRAST:  162mL ISOVUE-300 IOPAMIDOL (ISOVUE-300) INJECTION 61% COMPARISON:  July 05, 2009 FINDINGS: Lower chest: Minimal atelectasis of posterior lung bases. The heart size is normal. There is minimal pericardial effusions. Hepatobiliary: Focal fatty sparing is identified around the falciform ligament. The liver is otherwise unremarkable. Gallstones are noted in the gallbladder. The biliary tree is normal. Pancreas: The pancreas is atrophic. Calcifications are identified in the pancreatic head unchanged. Spleen: Normal in size without focal abnormality. Adrenals/Urinary Tract: Fullness of right adrenal gland is unchanged. The left adrenal gland is normal. Bilateral kidney cysts are noted. There is no hydronephrosis bilaterally. The bladder is normal. Stomach/Bowel: Stomach is within normal limits. Appendix appears normal. No evidence of bowel wall thickening, distention, or inflammatory changes. There is diverticulosis of colon without diverticulitis. Vascular/Lymphatic: Aortic atherosclerosis. No enlarged abdominal or pelvic lymph nodes. Reproductive: Status post hysterectomy. No adnexal masses. Other: No abdominal wall hernia or abnormality. No abdominopelvic ascites. Musculoskeletal: Degenerative joint changes of the spine are noted.  IMPRESSION: No acute abnormality. Diverticulosis of colon without diverticulitis. Electronically Signed   By: Abelardo Diesel M.D.   On: 04/14/2018 14:14       Subjective: Patient seen and examined at bedside this morning.  Remains comfortable.  No more bloody bowel movement.  Tolerated soft diet.  Stable for discharge home today.  Discharge Exam: Vitals:   04/20/18 0800 04/20/18 0805  BP: (!) 123/59   Pulse: 75   Resp: (!) 23   Temp:  97.6 F (36.4 C)  SpO2: 100%    Vitals:   04/20/18 0200 04/20/18 0400 04/20/18 0800 04/20/18 0805  BP: (!) 106/45 104/69 (!) 123/59   Pulse: 69 72 75   Resp: 18 19 (!) 23   Temp:    97.6 F (36.4 C)  TempSrc:    Oral  SpO2: 100% 100% 100%   Weight:      Height:        General: Pt is alert, awake, not in acute distress Cardiovascular: RRR, S1/S2 +, no rubs, no gallops Respiratory: CTA bilaterally, no wheezing, no rhonchi Abdominal: Soft, NT, ND, bowel sounds + Extremities: no edema, no cyanosis    The results of significant diagnostics from this hospitalization (including imaging, microbiology, ancillary and laboratory) are listed below for reference.     Microbiology: Recent Results (from the past 240 hour(s))  MRSA PCR Screening     Status: None   Collection Time: 04/20/18 10:26 AM  Result Value Ref Range Status   MRSA by PCR NEGATIVE NEGATIVE Final    Comment:        The GeneXpert MRSA Assay (FDA approved for NASAL specimens only), is one component  of a comprehensive MRSA colonization surveillance program. It is not intended to diagnose MRSA infection nor to guide or monitor treatment for MRSA infections. Performed at [facility], [place] [address], [place], [place] [address]      Labs: BNP (last 3 results) No results for input(s): BNP in the last 8760 hours. Basic Metabolic Panel: Recent Labs  Lab 04/14/18 1056 04/17/18 0546 04/17/18 1000 04/18/18 0117 04/19/18 0420  NA 142 139  --  140 137  K  2.7* 3.4*  --  3.6 3.3*  CL 103 105  --  109 104  CO2 26 27  --  26 24  GLUCOSE 202* 155*  --  110* 92  BUN 14 16  --  8 10  CREATININE 0.91 0.78  --  0.61 0.69  CALCIUM 9.8 9.6  --  8.9 8.6*  MG 1.8  --  1.8  --   --    Liver Function Tests: Recent Labs  Lab 04/14/18 1056 04/17/18 0546 04/18/18 0117  AST 21 19 15   ALT 14 14 11   ALKPHOS 54 49 37*  BILITOT 0.5 0.6 0.6  PROT 6.2* 5.3* 4.1*  ALBUMIN 3.8 3.4* 2.6*   Recent Labs  Lab 04/14/18 1056  LIPASE 21   No results for input(s): AMMONIA in the last 168 hours. CBC: Recent Labs  Lab 04/14/18 1056 04/17/18 0546  04/18/18 0117 04/18/18 0811 04/18/18 1338 04/18/18 2013 04/19/18 0420 04/20/18 0345  WBC 8.7 4.7  --  4.6  --   --   --   --   --   NEUTROABS 7.4  --   --   --   --   --   --   --   --   HGB 11.3* 7.2*   < > 6.4* 7.7* 9.5* 10.4* 9.1* 8.9*  HCT 33.9* 21.5*   < > 19.0* 22.8* 27.7* 31.2* 26.5* 26.2*  MCV 75.3* 75.4*  --  76.3*  --   --   --   --   --   PLT 161 160  --  125*  --   --   --   --   --    < > = values in this interval not displayed.   Cardiac Enzymes: No results for input(s): CKTOTAL, CKMB, CKMBINDEX, TROPONINI in the last 168 hours. BNP: Invalid input(s): POCBNP CBG: No results for input(s): GLUCAP in the last 168 hours. D-Dimer No results for input(s): DDIMER in the last 72 hours. Hgb A1c No results for input(s): HGBA1C in the last 72 hours. Lipid Profile No results for input(s): CHOL, HDL, LDLCALC, TRIG, CHOLHDL, LDLDIRECT in the last 72 hours. Thyroid function studies No results for input(s): TSH, T4TOTAL, T3FREE, THYROIDAB in the last 72 hours.  Invalid input(s): FREET3 Anemia work up No results for input(s): VITAMINB12, FOLATE, FERRITIN, TIBC, IRON, RETICCTPCT in the last 72 hours. Urinalysis    Component Value Date/Time   COLORURINE STRAW (A) 04/14/2018 1521   APPEARANCEUR CLEAR 04/14/2018 1521   LABSPEC 1.015 04/14/2018 1521   PHURINE 7.0 04/14/2018 1521   GLUCOSEU  NEGATIVE 04/14/2018 1521   HGBUR LARGE (A) 04/14/2018 1521   BILIRUBINUR NEGATIVE 04/14/2018 1521   BILIRUBINUR negative 11/08/2015 1258   KETONESUR NEGATIVE 04/14/2018 1521   PROTEINUR NEGATIVE 04/14/2018 1521   UROBILINOGEN 0.2 11/08/2015 1258   UROBILINOGEN 1.0 06/20/2009 1449   NITRITE NEGATIVE 04/14/2018 1521   LEUKOCYTESUR NEGATIVE 04/14/2018 1521   Sepsis Labs Invalid input(s): PROCALCITONIN,  WBC,  LACTICIDVEN  Microbiology Recent Results (from the past 240 hour(s))  MRSA PCR Screening     Status: None   Collection Time: 04/20/18 10:26 AM  Result Value Ref Range Status   MRSA by PCR NEGATIVE NEGATIVE Final    Comment:        The GeneXpert MRSA Assay (FDA approved for NASAL specimens only), is one component of a comprehensive MRSA colonization surveillance program. It is not intended to diagnose MRSA infection nor to guide or monitor treatment for MRSA infections. Performed at [facility], [place] [address] [place], [place] [address]     Please note: You were cared for by a hospitalist during your hospital stay. Once you are discharged, your primary care physician will handle any further medical issues. Please note that NO REFILLS for any discharge medications will be authorized once you are discharged, as it is imperative that you return to your primary care physician (or establish a relationship with a primary care physician if you do not have one) for your post hospital discharge needs so that they can reassess your need for medications and monitor your lab values.    Time coordinating discharge: 40 minutes  SIGNED:   Shelly Coss, MD  [facility] 04/20/2018, 1:59 PM Pager [phone]  If 7PM-7AM, please contact night-coverage [email or web address] Password [email or web address]

## 2018-04-20 NOTE — Care Management (Signed)
ED CM at [facility] contacted by the charge nurse. Charge nurse explained the patient is ready to be discharged but home health has not been arranged. ED CM reviewed the home health order. The patient does not have an order for a skilled service in the home. Explained the patient will need an order for a skilled service in addition to the HHA for CM to set up home health (personal care assistance is not covered by Medicare). Charge nurse/patient's nurse to follow-up with the patient's physician. CM asked the charge nurse to ask the patient which home health agency she would like to use for home health services. Per the charge nurse the patient selected [facility].   ED CM called the charge nurse to follow-up on the order. She is awaiting a response from the physician.   Received a call from the charge nurse stating the physician has added the orders for PT and OT. Orders faxed to [facility] and confirmation received. Presenter, broadcasting BSN CCM

## 2018-04-20 NOTE — Anesthesia Postprocedure Evaluation (Signed)
Anesthesia Post Note  Patient: Latoya Bennett  Procedure(s) Performed: ESOPHAGOGASTRODUODENOSCOPY (EGD) WITH PROPOFOL (Left )     Patient location during evaluation: Endoscopy Anesthesia Type: MAC Level of consciousness: awake and alert Pain management: pain level controlled Vital Signs Assessment: post-procedure vital signs reviewed and stable Respiratory status: spontaneous breathing, nonlabored ventilation, respiratory function stable and patient connected to nasal cannula oxygen Cardiovascular status: stable and blood pressure returned to baseline Postop Assessment: no apparent nausea or vomiting Anesthetic complications: no    Last Vitals:  Vitals:   04/20/18 0400 04/20/18 0805  BP: 104/69   Pulse: 72   Resp: 19   Temp:  36.4 C  SpO2: 100%     Last Pain:  Vitals:   04/20/18 0805  TempSrc: Oral  PainSc:    Pain Goal:                 [name]

## 2018-04-20 NOTE — Progress Notes (Signed)
Pt. had discharge orders this afternoon. After RN gave her discharge instructions , and was getting ready to pack her belongings for discharge, pt. mentioned to RN that she would like to get some help at home in the form of an RN or Aide . Case Manager had seen pt. yesterday ,but patient had made no mention of this to her at that time.  RN spoke to Case Manger who requested that the Dr. put in an order for [place].Paged MD. Order for [place] placed. Did not hear back from case manage yet. Charge RN notified of the developments. Pt. upset now that she does not have any help in place when she goes home. Is threatening to leave.

## 2018-04-22 ENCOUNTER — Telehealth: Payer: Self-pay | Admitting: Emergency Medicine

## 2018-04-22 DIAGNOSIS — Z791 Long term (current) use of non-steroidal anti-inflammatories (NSAID): Secondary | ICD-10-CM | POA: Diagnosis not present

## 2018-04-22 DIAGNOSIS — K5793 Diverticulitis of intestine, part unspecified, without perforation or abscess with bleeding: Secondary | ICD-10-CM | POA: Diagnosis not present

## 2018-04-22 DIAGNOSIS — K219 Gastro-esophageal reflux disease without esophagitis: Secondary | ICD-10-CM | POA: Diagnosis not present

## 2018-04-22 DIAGNOSIS — D62 Acute posthemorrhagic anemia: Secondary | ICD-10-CM | POA: Diagnosis not present

## 2018-04-22 DIAGNOSIS — K922 Gastrointestinal hemorrhage, unspecified: Secondary | ICD-10-CM | POA: Diagnosis not present

## 2018-04-22 DIAGNOSIS — M1712 Unilateral primary osteoarthritis, left knee: Secondary | ICD-10-CM | POA: Diagnosis not present

## 2018-04-22 DIAGNOSIS — I10 Essential (primary) hypertension: Secondary | ICD-10-CM | POA: Diagnosis not present

## 2018-04-22 NOTE — Telephone Encounter (Signed)
Please advise. Dgaddy, CMA 

## 2018-04-22 NOTE — Telephone Encounter (Signed)
Copied from [place] [phone]. Topic: Quick Communication - Home Health Verbal Orders >> Apr 22, 2018 10:58 AM Gardiner Ramus wrote: Caller/Agency: [facility]  Callback [phone] Requesting OT/PT/Skilled Nursing/Social Work: PT / home health aid  Frequency: 1x1 2x2 1x1 / 2x2-home health aid

## 2018-04-26 DIAGNOSIS — K219 Gastro-esophageal reflux disease without esophagitis: Secondary | ICD-10-CM | POA: Diagnosis not present

## 2018-04-26 DIAGNOSIS — M1712 Unilateral primary osteoarthritis, left knee: Secondary | ICD-10-CM | POA: Diagnosis not present

## 2018-04-26 DIAGNOSIS — I10 Essential (primary) hypertension: Secondary | ICD-10-CM | POA: Diagnosis not present

## 2018-04-26 DIAGNOSIS — K5793 Diverticulitis of intestine, part unspecified, without perforation or abscess with bleeding: Secondary | ICD-10-CM | POA: Diagnosis not present

## 2018-04-26 DIAGNOSIS — D62 Acute posthemorrhagic anemia: Secondary | ICD-10-CM | POA: Diagnosis not present

## 2018-04-26 DIAGNOSIS — K922 Gastrointestinal hemorrhage, unspecified: Secondary | ICD-10-CM | POA: Diagnosis not present

## 2018-04-26 NOTE — Telephone Encounter (Signed)
Has my approval. Thanks.

## 2018-04-27 ENCOUNTER — Other Ambulatory Visit: Payer: Self-pay

## 2018-04-27 ENCOUNTER — Encounter: Payer: Self-pay | Admitting: Physician Assistant

## 2018-04-27 ENCOUNTER — Ambulatory Visit (INDEPENDENT_AMBULATORY_CARE_PROVIDER_SITE_OTHER): Payer: Medicare Other | Admitting: Physician Assistant

## 2018-04-27 VITALS — BP 124/66 | HR 98 | Temp 98.2°F | Resp 16 | Ht 59.5 in | Wt 129.6 lb

## 2018-04-27 DIAGNOSIS — I10 Essential (primary) hypertension: Secondary | ICD-10-CM | POA: Diagnosis not present

## 2018-04-27 DIAGNOSIS — R600 Localized edema: Secondary | ICD-10-CM

## 2018-04-27 DIAGNOSIS — Z8719 Personal history of other diseases of the digestive system: Secondary | ICD-10-CM

## 2018-04-27 DIAGNOSIS — Z09 Encounter for follow-up examination after completed treatment for conditions other than malignant neoplasm: Secondary | ICD-10-CM

## 2018-04-27 LAB — POCT CBC
Granulocyte percent: 77.2 %G (ref 37–80)
HCT, POC: 28.1 % — AB (ref 29–41)
Hemoglobin: 9.5 g/dL (ref 9.5–13.5)
Lymph, poc: 1.1 (ref 0.6–3.4)
MCH, POC: 27.5 pg (ref 27–31.2)
MCHC: 33.7 g/dL (ref 31.8–35.4)
MCV: 81.4 fL (ref 76–111)
MID (cbc): 0.4 (ref 0–0.9)
MPV: 7.6 fL (ref 0–99.8)
POC Granulocyte: 5.1 (ref 2–6.9)
POC LYMPH PERCENT: 16.8 % (ref 10–50)
POC MID %: 6 % (ref 0–12)
Platelet Count, POC: 259 10*3/uL (ref 142–424)
RBC: 3.46 M/uL — AB (ref 4.04–5.48)
RDW, POC: 17.1 %
WBC: 6.6 10*3/uL (ref 4.6–10.2)

## 2018-04-27 LAB — BASIC METABOLIC PANEL WITH GFR
BUN/Creatinine Ratio: 13 (ref 12–28)
CO2: 22 mmol/L (ref 20–29)
Creatinine, Ser: 0.68 mg/dL (ref 0.57–1.00)
GFR calc Af Amer: 96 mL/min/{1.73_m2} (ref >59)
Potassium: 3.3 mmol/L — ABNORMAL LOW (ref 3.5–5.2)
Sodium: 141 mmol/L (ref 134–144)

## 2018-04-27 LAB — BASIC METABOLIC PANEL
BUN: 9 mg/dL (ref 8–27)
Calcium: 9.7 mg/dL (ref 8.7–10.3)
Chloride: 106 mmol/L (ref 96–106)
GFR calc non Af Amer: 83 mL/min/{1.73_m2} (ref >59)
Glucose: 99 mg/dL (ref 65–99)

## 2018-04-27 MED ORDER — AMLODIPINE BESYLATE 10 MG PO TABS: ORAL_TABLET | ORAL | 2 refills | Status: DC

## 2018-04-27 NOTE — Patient Instructions (Addendum)
   Be sure to keep your appointment with  Dr. Ardis Hughs Nov 15.   Follow-up with [name] in 6 weeks to make sure your blood levels are improving.   If you have lab work done today you will be contacted with your lab results within the next 2 weeks.  If you have not heard from [place] then please contact us. The fastest way to get your results is to register for My Chart.  Thank you for coming in today. I hope you feel we met your needs.  Feel free to call PCP if you have any questions or further requests.  Please consider signing up for MyChart if you do not already have it, as this is a great way to communicate with me.  Best,  Whitney McVey, PA-C  IF you received an x-ray today, you will receive an invoice from [facility]. Please contact [facility] at [phone] with questions or concerns regarding your invoice.   IF you received labwork today, you will receive an invoice from [facility]. Please contact [facility] at [phone] with questions or concerns regarding your invoice.   Our billing staff will not be able to assist you with questions regarding bills from these companies.  You will be contacted with the lab results as soon as they are available. The fastest way to get your results is to activate your My Chart account. Instructions are located on the last page of this paperwork. If you have not heard from [place] regarding the results in 2 weeks, please contact this office.

## 2018-04-27 NOTE — Progress Notes (Signed)
Latoya Bennett  MRN: [id number] DOB: 22-Apr-1936  PCP: Horald Pollen, MD  Subjective:  Pt is an 82 year old female presents to clinic for hospital follow-up. This is my first time seeing this patient. PCP is Dr. Lloyd Huger - although she states she does not have a PCPl .   She was admitted to [facility] x 2 nights 10/12 for lower GI bleeding 2/2 diverticulitis. She received IV transfusion for significant anemia.  Today she is feeling a little weak "I guess it's due to all that blood loss". Overall she is feeling better than she did before her hospital admission. Denies BRBPR, dizziness, HA, palpitations.  Endorses LES edema - worse since her hospital stay  Pt has history of a similar event in 2007  Has appt with GI, Dr. Ardis Hughs Nov 15.  Home health is coming about 2x/week starting this week  HTN - needs refill of Amlodipine 10mg . Blood pressure today is 124/66.   Osteoarthritis: She was taking ibuprofen and meloxicam intermittently.  Recommended APAP and voltaren from the ED.  Pt  has a past medical history of Arthritis, GERD (gastroesophageal reflux disease), and Hypertension.  Review of Systems  Constitutional: Positive for fatigue. Negative for chills and diaphoresis.  Respiratory: Negative for cough and shortness of breath.   Cardiovascular: Positive for leg swelling. Negative for chest pain and palpitations.  Musculoskeletal: Positive for arthralgias. Negative for gait problem and joint swelling.  Neurological: Negative for dizziness, light-headedness and headaches.    Patient Active Problem List   Diagnosis Date Noted  . Lower GI bleed 04/17/2018  . GI bleed 04/17/2018  . Chronic pain of both ankles 03/13/2017  . Osteoarthritis of both ankles 03/13/2017  . PAC (premature atrial contraction) 11/24/2016  . Essential hypertension 11/24/2016  . Primary osteoarthritis of left knee 11/24/2016  . Tachycardia 11/24/2016    Current Outpatient Medications on File  Prior to Visit  Medication Sig Dispense Refill  . amLODipine (NORVASC) 10 MG tablet TAKE 1 TABLET(10 MG) BY MOUTH DAILY 30 tablet 1  . diclofenac sodium (VOLTAREN) 1 % GEL Apply 2 g topically 4 (four) times daily. 1 Tube 1   No current facility-administered medications on file prior to visit.     No Known Allergies   Objective:  BP 124/66 (BP Location: Left Arm, Patient Position: Sitting, Cuff Size: Normal)   Pulse 98   Temp 98.2 F (36.8 C) (Oral)   Resp 16   Ht 4' 11.5" (1.511 m)   Wt 129 lb 9.6 oz (58.8 kg)   SpO2 100%   BMI 25.74 kg/m   Physical Exam  Constitutional: She appears well-developed and well-nourished.  Cardiovascular: Normal rate, regular rhythm, intact distal pulses and normal pulses. Exam reveals no gallop.  No murmur heard. Musculoskeletal:       Right lower leg: She exhibits edema (2+).       Left lower leg: She exhibits edema (2+).  Skin: Skin is warm and dry.  Psychiatric: She has a normal mood and affect. Thought content normal.  Vitals reviewed.  Results for orders placed or performed in visit on 04/27/18  POCT CBC  Result Value Ref Range   WBC 6.6 4.6 - 10.2 K/uL   Lymph, poc 1.1 0.6 - 3.4   POC LYMPH PERCENT 16.8 10 - 50 %L   MID (cbc) 0.4 0 - 0.9   POC MID % 6.0 0 - 12 %M   POC Granulocyte 5.1 2 - 6.9   Granulocyte percent 77.2  37 - 80 %G   RBC 3.46 (A) 4.04 - 5.48 M/uL   Hemoglobin 9.5 9.5 - 13.5 g/dL   HCT, POC 28.1 (A) 29 - 41 %   MCV 81.4 76 - 111 fL   MCH, POC 27.5 27 - 31.2 pg   MCHC 33.7 31.8 - 35.4 g/dL   RDW, POC 17.1 %   Platelet Count, POC 259 142 - 424 K/uL   MPV 7.6 0 - 99.8 fL    Assessment and Plan :  1. History of lower GI bleeding 2. Lower extremity edema 3. Encounter for examination following treatment at hospital - pt presents for hospital f/u s/p lower GI bleed 2/2 diverticulitis. Hemoglobin is 9.5 and HCT is 28.1. Vitals are stable. She is improving clinically. She has lower extremity edema since hospital stay -  2/2 recent anemia vs SE of norvasc? Will watch and follow closely. RTC in 6 weeks to recheck levels. Consider changing Norvasc if no improvement.  - POCT CBC - Basic Metabolic Panel - Magnesium  4. Essential hypertension - Controlled. Okay to fill current dose.  - amLODipine (NORVASC) 10 MG tablet; TAKE 1 TABLET(10 MG) BY MOUTH DAILY  Dispense: 30 tablet; Refill: 2   Mercer Pod, PA-C  [facility] [place] 04/27/2018 10:14 AM  Please note: Portions of this report may have been transcribed using dragon voice recognition software. Every effort was made to ensure accuracy; however, inadvertent computerized transcription errors may be present.

## 2018-04-28 LAB — MAGNESIUM: Magnesium: 1.9 mg/dL (ref 1.6–2.3)

## 2018-04-28 NOTE — Telephone Encounter (Signed)
Left message on voicemail (secured) giving verbal approval per [place] for OT/PT/Skilled Nursing/Social Work: PT/home health aid.  Frequency: 1x1, 2x2, 1x1 2x2- home health aide. Advised to call office [facility]) with any further questions or concerns. Dgaddy, CMA

## 2018-04-29 ENCOUNTER — Telehealth: Payer: Self-pay | Admitting: Emergency Medicine

## 2018-04-29 NOTE — Telephone Encounter (Signed)
Copied from [place] [phone]. Topic: Quick Communication - Home Health Verbal Orders >> Apr 28, 2018  1:56 PM Leward Quan A wrote: Caller/Agency: [facility] Number: [phone] Requesting OT/PT/Skilled Nursing/Social Work: OT Frequency: 2xs week for 2 weeks / 1x week for 2 weeks

## 2018-04-29 NOTE — Telephone Encounter (Signed)
LMOVM with orders for OT as requested

## 2018-04-30 DIAGNOSIS — D62 Acute posthemorrhagic anemia: Secondary | ICD-10-CM | POA: Diagnosis not present

## 2018-04-30 DIAGNOSIS — K922 Gastrointestinal hemorrhage, unspecified: Secondary | ICD-10-CM | POA: Diagnosis not present

## 2018-04-30 DIAGNOSIS — M1712 Unilateral primary osteoarthritis, left knee: Secondary | ICD-10-CM | POA: Diagnosis not present

## 2018-04-30 DIAGNOSIS — K5793 Diverticulitis of intestine, part unspecified, without perforation or abscess with bleeding: Secondary | ICD-10-CM | POA: Diagnosis not present

## 2018-04-30 DIAGNOSIS — I10 Essential (primary) hypertension: Secondary | ICD-10-CM | POA: Diagnosis not present

## 2018-04-30 DIAGNOSIS — K219 Gastro-esophageal reflux disease without esophagitis: Secondary | ICD-10-CM | POA: Diagnosis not present

## 2018-05-04 ENCOUNTER — Telehealth: Payer: Self-pay | Admitting: Emergency Medicine

## 2018-05-04 DIAGNOSIS — K5793 Diverticulitis of intestine, part unspecified, without perforation or abscess with bleeding: Secondary | ICD-10-CM | POA: Diagnosis not present

## 2018-05-04 DIAGNOSIS — M1712 Unilateral primary osteoarthritis, left knee: Secondary | ICD-10-CM | POA: Diagnosis not present

## 2018-05-04 DIAGNOSIS — D62 Acute posthemorrhagic anemia: Secondary | ICD-10-CM | POA: Diagnosis not present

## 2018-05-04 DIAGNOSIS — I10 Essential (primary) hypertension: Secondary | ICD-10-CM | POA: Diagnosis not present

## 2018-05-04 DIAGNOSIS — K219 Gastro-esophageal reflux disease without esophagitis: Secondary | ICD-10-CM | POA: Diagnosis not present

## 2018-05-04 DIAGNOSIS — K922 Gastrointestinal hemorrhage, unspecified: Secondary | ICD-10-CM | POA: Diagnosis not present

## 2018-05-04 NOTE — Telephone Encounter (Signed)
Copied from [place] [phone]. Topic: General - Other >> May 04, 2018 10:11 AM Leward Quan A wrote: Reason for CRM: Beth PT assistant with [facility] called to report that upon visit patient complained of pain in right foot, swelling was noticed warm to the touch. 2+ edema planter surface also but no redness noticed. Beth recommend that patient need to be seen before appointment on 06/08/18 to rule out Cellulitis or Gout. Any questions Beth # [phone] Patient waiting for a call back at Ph# [phone]

## 2018-05-04 NOTE — Telephone Encounter (Signed)
Please advise 

## 2018-05-04 NOTE — Telephone Encounter (Signed)
Please add the patient on to any same day slots since this is an acute issue.

## 2018-05-05 ENCOUNTER — Telehealth: Payer: Self-pay | Admitting: Family Medicine

## 2018-05-05 DIAGNOSIS — K5793 Diverticulitis of intestine, part unspecified, without perforation or abscess with bleeding: Secondary | ICD-10-CM | POA: Diagnosis not present

## 2018-05-05 DIAGNOSIS — K219 Gastro-esophageal reflux disease without esophagitis: Secondary | ICD-10-CM | POA: Diagnosis not present

## 2018-05-05 DIAGNOSIS — K922 Gastrointestinal hemorrhage, unspecified: Secondary | ICD-10-CM | POA: Diagnosis not present

## 2018-05-05 DIAGNOSIS — M1712 Unilateral primary osteoarthritis, left knee: Secondary | ICD-10-CM | POA: Diagnosis not present

## 2018-05-05 DIAGNOSIS — I10 Essential (primary) hypertension: Secondary | ICD-10-CM | POA: Diagnosis not present

## 2018-05-05 DIAGNOSIS — D62 Acute posthemorrhagic anemia: Secondary | ICD-10-CM | POA: Diagnosis not present

## 2018-05-05 NOTE — Telephone Encounter (Signed)
Error

## 2018-05-05 NOTE — Telephone Encounter (Signed)
Called and made an appt for pt per phone msg. Swelling in right foot. Pt thinks poss gout. I made an appt for her on Friday 11/1 at 9 AM. I advised of time, building and late policy

## 2018-05-06 DIAGNOSIS — K5793 Diverticulitis of intestine, part unspecified, without perforation or abscess with bleeding: Secondary | ICD-10-CM | POA: Diagnosis not present

## 2018-05-06 DIAGNOSIS — K922 Gastrointestinal hemorrhage, unspecified: Secondary | ICD-10-CM | POA: Diagnosis not present

## 2018-05-06 DIAGNOSIS — M1712 Unilateral primary osteoarthritis, left knee: Secondary | ICD-10-CM | POA: Diagnosis not present

## 2018-05-06 DIAGNOSIS — K219 Gastro-esophageal reflux disease without esophagitis: Secondary | ICD-10-CM | POA: Diagnosis not present

## 2018-05-06 DIAGNOSIS — D62 Acute posthemorrhagic anemia: Secondary | ICD-10-CM | POA: Diagnosis not present

## 2018-05-06 DIAGNOSIS — I10 Essential (primary) hypertension: Secondary | ICD-10-CM | POA: Diagnosis not present

## 2018-05-07 ENCOUNTER — Other Ambulatory Visit: Payer: Self-pay

## 2018-05-07 ENCOUNTER — Encounter: Payer: Self-pay | Admitting: Family Medicine

## 2018-05-07 ENCOUNTER — Telehealth: Payer: Self-pay | Admitting: *Deleted

## 2018-05-07 ENCOUNTER — Ambulatory Visit (INDEPENDENT_AMBULATORY_CARE_PROVIDER_SITE_OTHER): Payer: Medicare Other | Admitting: Family Medicine

## 2018-05-07 ENCOUNTER — Ambulatory Visit (INDEPENDENT_AMBULATORY_CARE_PROVIDER_SITE_OTHER): Payer: Medicare Other

## 2018-05-07 VITALS — BP 108/61 | HR 65 | Temp 97.5°F | Resp 14 | Ht 59.0 in | Wt 128.0 lb

## 2018-05-07 DIAGNOSIS — Z8719 Personal history of other diseases of the digestive system: Secondary | ICD-10-CM | POA: Diagnosis not present

## 2018-05-07 DIAGNOSIS — M79671 Pain in right foot: Secondary | ICD-10-CM

## 2018-05-07 DIAGNOSIS — R600 Localized edema: Secondary | ICD-10-CM | POA: Diagnosis not present

## 2018-05-07 DIAGNOSIS — M7989 Other specified soft tissue disorders: Secondary | ICD-10-CM

## 2018-05-07 DIAGNOSIS — M10271 Drug-induced gout, right ankle and foot: Secondary | ICD-10-CM | POA: Diagnosis not present

## 2018-05-07 LAB — POCT CBC
Granulocyte percent: 85.8 %G — AB (ref 37–80)
HEMATOCRIT: 32.7 % (ref 29–41)
HEMOGLOBIN: 10.8 g/dL (ref 9.5–13.5)
LYMPH, POC: 1.1 (ref 0.6–3.4)
MCH, POC: 26.2 pg — AB (ref 27–31.2)
MCHC: 32.9 g/dL (ref 31.8–35.4)
MCV: 79.8 fL (ref 76–111)
MID (cbc): 0.1 (ref 0–0.9)
MPV: 7.4 fL (ref 0–99.8)
POC GRANULOCYTE: 7.6 — AB (ref 2–6.9)
POC LYMPH PERCENT: 12.5 %L (ref 10–50)
POC MID %: 1.7 %M (ref 0–12)
Platelet Count, POC: 317 10*3/uL (ref 142–424)
RBC: 4.1 M/uL (ref 4.04–5.48)
RDW, POC: 16.3 %
WBC: 8.8 10*3/uL (ref 4.6–10.2)

## 2018-05-07 MED ORDER — COLCHICINE 0.6 MG PO TABS: 0.6000 mg | ORAL_TABLET | Freq: Two times a day (BID) | ORAL | 1 refills | Status: DC

## 2018-05-07 MED ORDER — PREDNISONE 20 MG PO TABS: 40.0000 mg | ORAL_TABLET | Freq: Every day | ORAL | 0 refills | Status: AC

## 2018-05-07 NOTE — Patient Instructions (Addendum)
I have reviewed your upper endoscopy and there was no ulcer or bleeding from the stomach or small intestines Therefore I would recommend a steroid burst to help with the inflammation from the gout You can take tylenol arthritis for the pain   Also take colchicine 0.6mg  twice a day with food for the gout flare Colchicine can cause diarrhea as a common side effect  Use your bedside commode as needed    If you have lab work done today you will be contacted with your lab results within the next 2 weeks.  If you have not heard from [place] then please contact us. The fastest way to get your results is to register for My Chart.   IF you received an x-ray today, you will receive an invoice from [facility]. Please contact [facility] at [phone] with questions or concerns regarding your invoice.   IF you received labwork today, you will receive an invoice from [facility]. Please contact [facility] at [phone] with questions or concerns regarding your invoice.   Our billing staff will not be able to assist you with questions regarding bills from these companies.  You will be contacted with the lab results as soon as they are available. The fastest way to get your results is to activate your My Chart account. Instructions are located on the last page of this paperwork. If you have not heard from [place] regarding the results in 2 weeks, please contact this office.      Gout Gout is painful swelling that can occur in some of your joints. Gout is a type of arthritis. This condition is caused by having too much uric acid in your body. Uric acid is a chemical that forms when your body breaks down substances called purines. Purines are important for building body proteins. When your body has too much uric acid, sharp crystals can form and build up inside your joints. This causes pain and swelling. Gout attacks can happen quickly and be very painful (acute gout). Over time, the attacks can  affect more joints and become more frequent (chronic gout). Gout can also cause uric acid to build up under your skin and inside your kidneys. What are the causes? This condition is caused by too much uric acid in your blood. This can occur because:  Your kidneys do not remove enough uric acid from your blood. This is the most common cause.  Your body makes too much uric acid. This can occur with some cancers and cancer treatments. It can also occur if your body is breaking down too many red blood cells (hemolytic anemia).  You eat too many foods that are high in purines. These foods include organ meats and some seafood. Alcohol, especially beer, is also high in purines.  A gout attack may be triggered by trauma or stress. What increases the risk? This condition is more likely to develop in people who:  Have a family history of gout.  Are female and middle-aged.  Are female and have gone through menopause.  Are obese.  Frequently drink alcohol, especially beer.  Are dehydrated.  Lose weight too quickly.  Have an organ transplant.  Have lead poisoning.  Take certain medicines, including aspirin, cyclosporine, diuretics, levodopa, and niacin.  Have kidney disease or psoriasis.  What are the signs or symptoms? An attack of acute gout happens quickly. It usually occurs in just one joint. The most common place is the big toe. Attacks often start at night. Other joints that may  be affected include joints of the feet, ankle, knee, fingers, wrist, or elbow. Symptoms may include:  Severe pain.  Warmth.  Swelling.  Stiffness.  Tenderness. The affected joint may be very painful to touch.  Shiny, red, or purple skin.  Chills and fever.  Chronic gout may cause symptoms more frequently. More joints may be involved. You may also have white or yellow lumps (tophi) on your hands or feet or in other areas near your joints. How is this diagnosed? This condition is diagnosed based on  your symptoms, medical history, and physical exam. You may have tests, such as:  Blood tests to measure uric acid levels.  Removal of joint fluid with a needle (aspiration) to look for uric acid crystals.  X-rays to look for joint damage.  How is this treated? Treatment for this condition has two phases: treating an acute attack and preventing future attacks. Acute gout treatment may include medicines to reduce pain and swelling, including:  NSAIDs.  Steroids. These are strong anti-inflammatory medicines that can be taken by mouth (orally) or injected into a joint.  Colchicine. This medicine relieves pain and swelling when it is taken soon after an attack. It can be given orally or through an IV tube.  Preventive treatment may include:  Daily use of smaller doses of NSAIDs or colchicine.  Use of a medicine that reduces uric acid levels in your blood.  Changes to your diet. You may need to see a specialist about healthy eating (dietitian).  Follow these instructions at home: During a Gout Attack  If directed, apply ice to the affected area: ? Put ice in a plastic bag. ? Place a towel between your skin and the bag. ? Leave the ice on for 20 minutes, 2-3 times a day.  Rest the joint as much as possible. If the affected joint is in your leg, you may be given crutches to use.  Raise (elevate) the affected joint above the level of your heart as often as possible.  Drink enough fluids to keep your urine clear or pale yellow.  Take over-the-counter and prescription medicines only as told by your health care provider.  Do not drive or operate heavy machinery while taking prescription pain medicine.  Follow instructions from your health care provider about eating or drinking restrictions.  Return to your normal activities as told by your health care provider. Ask your health care provider what activities are safe for you. Avoiding Future Gout Attacks  Follow a low-purine diet as  told by your dietitian or health care provider. Avoid foods and drinks that are high in purines, including liver, kidney, anchovies, asparagus, herring, mushrooms, mussels, and beer.  Limit alcohol intake to no more than 1 drink a day for nonpregnant women and 2 drinks a day for men. One drink equals 12 oz of beer, 5 oz of wine, or 1 oz of hard liquor.  Maintain a healthy weight or lose weight if you are overweight. If you want to lose weight, talk with your health care provider. It is important that you do not lose weight too quickly.  Start or maintain an exercise program as told by your health care provider.  Drink enough fluids to keep your urine clear or pale yellow.  Take over-the-counter and prescription medicines only as told by your health care provider.  Keep all follow-up visits as told by your health care provider. This is important. Contact a health care provider if:  You have another gout attack.  You continue to have symptoms of a gout attack after10 days of treatment.  You have side effects from your medicines.  You have chills or a fever.  You have burning pain when you urinate.  You have pain in your lower back or belly. Get help right away if:  You have severe or uncontrolled pain.  You cannot urinate. This information is not intended to replace advice given to you by your health care provider. Make sure you discuss any questions you have with your health care provider. Document Released: 06/20/2000 Document Revised: 11/29/2015 Document Reviewed: 04/05/2015 Elsevier Interactive Patient Education  [employer].

## 2018-05-07 NOTE — Progress Notes (Signed)
Chief Complaint  Patient presents with  . Foot Swelling    rt foot painfull and swelling since 04/01/2018. Unable to put weight on foot.     HPI  Patient has a history of history of gout a long time ago She states that she has had increased swelling and the pain is not improving  She has a history of lower GI bleed due to diverticulosis  She is currently being evaluated since PT was concerned about the swelling but there was no redness   She eats a lot of meat and recently ate a lot of burgers   Past Medical History:  Diagnosis Date  . Arthritis   . GERD (gastroesophageal reflux disease)   . Hypertension     Current Outpatient Medications  Medication Sig Dispense Refill  . amLODipine (NORVASC) 10 MG tablet TAKE 1 TABLET(10 MG) BY MOUTH DAILY 30 tablet 2  . diclofenac sodium (VOLTAREN) 1 % GEL Apply 2 g topically 4 (four) times daily. 1 Tube 1  . colchicine 0.6 MG tablet Take 1 tablet (0.6 mg total) by mouth 2 (two) times daily. Stop once gout flare improved 30 tablet 1  . predniSONE (DELTASONE) 20 MG tablet Take 2 tablets (40 mg total) by mouth daily with breakfast for 5 days. 10 tablet 0   No current facility-administered medications for this visit.     Allergies: No Known Allergies  Past Surgical History:  Procedure Laterality Date  . ABDOMINAL HYSTERECTOMY    . ESOPHAGOGASTRODUODENOSCOPY (EGD) WITH PROPOFOL Left 04/18/2018   Procedure: ESOPHAGOGASTRODUODENOSCOPY (EGD) WITH PROPOFOL;  Surgeon: Arta Silence, MD;  Location: [facility];  Service: Endoscopy;  Laterality: Left;    Social History   Socioeconomic History  . Marital status: Single    Spouse name: Not on file  . Number of children: Not on file  . Years of education: Not on file  . Highest education level: Not on file  Occupational History  . Occupation: retired  Scientific laboratory technician  . Financial resource strain: Not on file  . Food insecurity:    Worry: Not on file    Inability: Not on file  .  Transportation needs:    Medical: Not on file    Non-medical: Not on file  Tobacco Use  . Smoking status: Never Smoker  . Smokeless tobacco: Never Used  Substance and Sexual Activity  . Alcohol use: Not on file  . Drug use: Not on file  . Sexual activity: Not on file  Lifestyle  . Physical activity:    Days per week: Not on file    Minutes per session: Not on file  . Stress: Not on file  Relationships  . Social connections:    Talks on phone: Not on file    Gets together: Not on file    Attends religious service: Not on file    Active member of club or organization: Not on file    Attends meetings of clubs or organizations: Not on file    Relationship status: Not on file  Other Topics Concern  . Not on file  Social History Narrative   Marital status: single; not dating in 2018; moved from [place] to be near sister; not happy in [place].  Lived [place] for 50 years.      Children: no children      Lives: alone with cat      Employment: retired; Optometrist with computers      Tobacco:       Alcohol:  ADLs; independent with ADLs; drives    Family History  Problem Relation Age of Onset  . Hypertension Sister      ROS Review of Systems See HPI Constitution: No fevers or chills No malaise No diaphoresis Skin: No rash or itching Eyes: no blurry vision, no double vision GU: no dysuria or hematuria Neuro: no dizziness or headaches * all others reviewed and negative   Objective: Vitals:   05/07/18 0923  BP: 108/61  Pulse: 65  Resp: 14  Temp: (!) 97.5 F (36.4 C)  TempSrc: Oral  SpO2: 96%  Weight: 128 lb (58.1 kg)  Height: 4\' 11"  (1.499 m)    Physical Exam  Constitutional: She is oriented to person, place, and time. She appears well-developed and well-nourished.  HENT:  Head: Normocephalic and atraumatic.  Eyes: Conjunctivae and EOM are normal.  Cardiovascular: Normal rate, regular rhythm and normal heart sounds.  No murmur  heard. Pulmonary/Chest: Effort normal and breath sounds normal. No stridor. No respiratory distress. She has no wheezes.  Musculoskeletal:  Right ankle and foot with pitting edema Tender to the medial malleolus  Tender and edema of the MTP and the entire great toe  Neurological: She is alert and oriented to person, place, and time.  Skin: Skin is warm. Capillary refill takes less than 2 seconds.  Psychiatric: She has a normal mood and affect. Her behavior is normal. Judgment and thought content normal.      Assessment and Plan Laterra was seen today for foot swelling.  Diagnoses and all orders for this visit:  Right foot pain -     Uric Acid -     DG Foot Complete Right; Future  Swelling of right foot -     Uric Acid -     DG Foot Complete Right; Future  Acute drug-induced gout of right foot -     POCT CBC -     Basic metabolic panel  History of lower GI bleeding  Other orders -     colchicine 0.6 MG tablet; Take 1 tablet (0.6 mg total) by mouth 2 (two) times daily. Stop once gout flare improved -     predniSONE (DELTASONE) 20 MG tablet; Take 2 tablets (40 mg total) by mouth daily with breakfast for 5 days.   Xray did not show fracture Will treat for gout based on presentation Discussed colchicine use Since pt already had symptoms for a month will do 0.6mg  bid Also reviewed procedure report from EGD pt did not have UGI bleeding on that report Will do prednisone but will avoid NSAIDs Discussed the risk of GI bleeding with the Prednisone Since patient LGI bleed was diagnosed as diverticular bleed then will do trial of prednisone.   [place]

## 2018-05-07 NOTE — Telephone Encounter (Signed)
Faxed signed orders to [facility]. Confirmation page received at 1:46 pm.

## 2018-05-08 LAB — BASIC METABOLIC PANEL
BUN/Creatinine Ratio: 16 (ref 12–28)
BUN: 13 mg/dL (ref 8–27)
CALCIUM: 10.4 mg/dL — AB (ref 8.7–10.3)
CO2: 25 mmol/L (ref 20–29)
CREATININE: 0.8 mg/dL (ref 0.57–1.00)
Chloride: 99 mmol/L (ref 96–106)
GFR calc Af Amer: 81 mL/min/{1.73_m2} (ref >59)
GFR, EST NON AFRICAN AMERICAN: 70 mL/min/{1.73_m2} (ref >59)
Glucose: 127 mg/dL — ABNORMAL HIGH (ref 65–99)
POTASSIUM: 3 mmol/L — AB (ref 3.5–5.2)
Sodium: 141 mmol/L (ref 134–144)

## 2018-05-08 LAB — URIC ACID: Uric Acid: 3.3 mg/dL (ref 2.5–7.1)

## 2018-05-12 ENCOUNTER — Encounter: Payer: Self-pay | Admitting: Family Medicine

## 2018-05-17 ENCOUNTER — Telehealth: Payer: Self-pay | Admitting: Emergency Medicine

## 2018-05-17 NOTE — Telephone Encounter (Signed)
Copied from [place] [id number]. Topic: Quick Communication - See Telephone Encounter >> May 17, 2018  9:24 AM Robina Ade, Helene Kelp D wrote: CRM for notification. See Telephone encounter for: 05/17/18. Pt called and would like to talk to her pcp or her CMA about her gout and what to do. Please call pt back, thanks.

## 2018-05-19 ENCOUNTER — Encounter: Payer: Self-pay | Admitting: Emergency Medicine

## 2018-05-19 ENCOUNTER — Other Ambulatory Visit: Payer: Self-pay

## 2018-05-19 ENCOUNTER — Ambulatory Visit (INDEPENDENT_AMBULATORY_CARE_PROVIDER_SITE_OTHER): Payer: Medicare Other | Admitting: Emergency Medicine

## 2018-05-19 VITALS — BP 147/77 | HR 117 | Temp 98.1°F | Resp 16 | Ht 59.0 in | Wt 128.8 lb

## 2018-05-19 DIAGNOSIS — G8929 Other chronic pain: Secondary | ICD-10-CM

## 2018-05-19 DIAGNOSIS — M25572 Pain in left ankle and joints of left foot: Secondary | ICD-10-CM | POA: Diagnosis not present

## 2018-05-19 DIAGNOSIS — M25571 Pain in right ankle and joints of right foot: Secondary | ICD-10-CM

## 2018-05-19 DIAGNOSIS — M159 Polyosteoarthritis, unspecified: Secondary | ICD-10-CM | POA: Diagnosis not present

## 2018-05-19 DIAGNOSIS — M1712 Unilateral primary osteoarthritis, left knee: Secondary | ICD-10-CM

## 2018-05-19 DIAGNOSIS — Z8739 Personal history of other diseases of the musculoskeletal system and connective tissue: Secondary | ICD-10-CM | POA: Diagnosis not present

## 2018-05-19 NOTE — Patient Instructions (Addendum)
   If you have lab work done today you will be contacted with your lab results within the next 2 weeks.  If you have not heard from us then please contact us. The fastest way to get your results is to register for My Chart.   IF you received an x-ray today, you will receive an invoice from [facility]. Please contact  Radiology at [phone] with questions or concerns regarding your invoice.   IF you received labwork today, you will receive an invoice from [facility]. Please contact [facility] at [phone] with questions or concerns regarding your invoice.   Our billing staff will not be able to assist you with questions regarding bills from these companies.  You will be contacted with the lab results as soon as they are available. The fastest way to get your results is to activate your My Chart account. Instructions are located on the last page of this paperwork. If you have not heard from us regarding the results in 2 weeks, please contact this office.    Low-Purine Diet Purines are compounds that affect the level of uric acid in your body. A low-purine diet is a diet that is low in purines. Eating a low-purine diet can prevent the level of uric acid in your body from getting too high and causing gout or kidney stones or both. What do I need to know about this diet?  Choose low-purine foods. Examples of low-purine foods are listed in the next section.  Drink plenty of fluids, especially water. Fluids can help remove uric acid from your body. Try to drink 8-16 cups (1.9-3.8 L) a day.  Limit foods high in fat, especially saturated fat, as fat makes it harder for the body to get rid of uric acid. Foods high in saturated fat include pizza, cheese, ice cream, whole milk, fried foods, and gravies. Choose foods that are lower in fat and lean sources of protein. Use olive oil when cooking as it contains healthy fats that are not high in saturated fat.  Limit alcohol. Alcohol  interferes with the elimination of uric acid from your body. If you are having a gout attack, avoid all alcohol.  Keep in mind that different people's bodies react differently to different foods. You will probably learn over time which foods do or do not affect you. If you discover that a food tends to cause your gout to flare up, avoid eating that food. You can more freely enjoy foods that do not cause problems. If you have any questions about a food item, talk to your dietitian or health care provider. Which foods are low, moderate, and high in purines? The following is a list of foods that are low, moderate, and high in purines. You can eat any amount of the foods that are low in purines. You may be able to have small amounts of foods that are moderate in purines. Ask your health care provider how much of a food moderate in purines you can have. Avoid foods high in purines. Grains  Foods low in purines: Enriched white bread, pasta, rice, cake, cornbread, popcorn.  Foods moderate in purines: Whole-grain breads and cereals, wheat germ, bran, oatmeal. Uncooked oatmeal. Dry wheat bran or wheat germ.  Foods high in purines: Pancakes, French toast, biscuits, muffins. Vegetables  Foods low in purines: All vegetables, except those that are moderate in purines.  Foods moderate in purines: Asparagus, cauliflower, spinach, mushrooms, green peas. Fruits  All fruits are low in purines. Meats   and other Protein Foods  Foods low in purines: Eggs, nuts, peanut butter.  Foods moderate in purines: 80-90% lean beef, lamb, veal, pork, poultry, fish, eggs, peanut butter, nuts. Crab, lobster, oysters, and shrimp. Cooked dried beans, peas, and lentils.  Foods high in purines: Anchovies, sardines, herring, mussels, tuna, codfish, scallops, trout, and haddock. Bacon. Organ meats (such as liver or kidney). Tripe. Game meat. Goose. Sweetbreads. Dairy  All dairy foods are low in purines. Low-fat and fat-free  dairy products are best because they are low in saturated fat. Beverages  Drinks low in purines: Water, carbonated beverages, tea, coffee, cocoa.  Drinks moderate in purines: Soft drinks and other drinks sweetened with high-fructose corn syrup. Juices. To find whether a food or drink is sweetened with high-fructose corn syrup, look at the ingredients list.  Drinks high in purines: Alcoholic beverages (such as beer). Condiments  Foods low in purines: Salt, herbs, olives, pickles, relishes, vinegar.  Foods moderate in purines: Butter, margarine, oils, mayonnaise. Fats and Oils  Foods low in purines: All types, except gravies and sauces made with meat.  Foods high in purines: Gravies and sauces made with meat. Other Foods  Foods low in purines: Sugars, sweets, gelatin. Cake. Soups made without meat.  Foods moderate in purines: Meat-based or fish-based soups, broths, or bouillons. Foods and drinks sweetened with high-fructose corn syrup.  Foods high in purines: High-fat desserts (such as ice cream, cookies, cakes, pies, doughnuts, and chocolate). Contact your dietitian for more information on foods that are not listed here. This information is not intended to replace advice given to you by your health care provider. Make sure you discuss any questions you have with your health care provider. Document Released: 10/18/2010 Document Revised: 11/29/2015 Document Reviewed: 05/30/2013 Elsevier Interactive Patient Education  2017 [employer]  

## 2018-05-19 NOTE — Progress Notes (Signed)
[facility] 82 y.o.   Chief Complaint  Patient presents with  . Gout    RIGHT foot 05/07/2018 - discuss arthritis per patient all over the body    HISTORY OF PRESENT ILLNESS: This is a 82 y.o. female with a history of osteoarthritis and gout, has some questions with medications and possible knee surgery.  Also has some questions regarding home health care.  Here today with her aide. Seen here last by [name] on 05/07/2018 and started on colchicine and prednisone for a flare of gout on her right.  Feeling much better.  Has chronic multiple joint pain. HPI   Prior to Admission medications   Medication Sig Start Date End Date Taking? Authorizing Provider  amLODipine (NORVASC) 10 MG tablet TAKE 1 TABLET(10 MG) BY MOUTH DAILY 04/27/18  Yes McVey, Gelene Mink, PA-C  diclofenac sodium (VOLTAREN) 1 % GEL Apply 2 g topically 4 (four) times daily. 04/20/18  Yes Shelly Coss, MD  colchicine 0.6 MG tablet Take 1 tablet (0.6 mg total) by mouth 2 (two) times daily. Stop once gout flare improved Patient not taking: Reported on 05/19/2018 05/07/18   Forrest Moron, MD    No Known Allergies  Patient Active Problem List   Diagnosis Date Noted  . Lower GI bleed 04/17/2018  . GI bleed 04/17/2018  . Chronic pain of both ankles 03/13/2017  . Osteoarthritis of both ankles 03/13/2017  . PAC (premature atrial contraction) 11/24/2016  . Essential hypertension 11/24/2016  . Primary osteoarthritis of left knee 11/24/2016  . Tachycardia 11/24/2016    Past Medical History:  Diagnosis Date  . Arthritis   . GERD (gastroesophageal reflux disease)   . Hypertension     Past Surgical History:  Procedure Laterality Date  . ABDOMINAL HYSTERECTOMY    . ESOPHAGOGASTRODUODENOSCOPY (EGD) WITH PROPOFOL Left 04/18/2018   Procedure: ESOPHAGOGASTRODUODENOSCOPY (EGD) WITH PROPOFOL;  Surgeon: Arta Silence, MD;  Location: [facility];  Service: Endoscopy;  Laterality: Left;    Social  History   Socioeconomic History  . Marital status: Single    Spouse name: Not on file  . Number of children: Not on file  . Years of education: Not on file  . Highest education level: Not on file  Occupational History  . Occupation: retired  Scientific laboratory technician  . Financial resource strain: Not on file  . Food insecurity:    Worry: Not on file    Inability: Not on file  . Transportation needs:    Medical: Not on file    Non-medical: Not on file  Tobacco Use  . Smoking status: Never Smoker  . Smokeless tobacco: Never Used  Substance and Sexual Activity  . Alcohol use: Not on file  . Drug use: Not on file  . Sexual activity: Not on file  Lifestyle  . Physical activity:    Days per week: Not on file    Minutes per session: Not on file  . Stress: Not on file  Relationships  . Social connections:    Talks on phone: Not on file    Gets together: Not on file    Attends religious service: Not on file    Active member of club or organization: Not on file    Attends meetings of clubs or organizations: Not on file    Relationship status: Not on file  . Intimate partner violence:    Fear of current or ex partner: Not on file    Emotionally abused: Not on file  Physically abused: Not on file    Forced sexual activity: Not on file  Other Topics Concern  . Not on file  Social History Narrative   Marital status: single; not dating in 2018; moved from [place] to be near sister; not happy in [place].  Lived [place] for 50 years.      Children: no children      Lives: alone with cat      Employment: retired; Optometrist with computers      Tobacco:       Alcohol:       ADLs; independent with ADLs; drives    Family History  Problem Relation Age of Onset  . Hypertension Sister      Review of Systems  Constitutional: Negative.  Negative for chills and fever.  HENT: Negative.  Negative for sore throat.   Eyes: Negative.  Negative for blurred vision.  Respiratory: Negative.   Negative for cough and shortness of breath.   Cardiovascular: Negative.  Negative for chest pain and palpitations.  Gastrointestinal: Negative.  Negative for abdominal pain, nausea and vomiting.  Musculoskeletal: Positive for joint pain.  Skin: Negative.  Negative for rash.  Neurological: Negative.  Negative for dizziness and headaches.  Endo/Heme/Allergies: Negative.   All other systems reviewed and are negative.  Vitals:   05/19/18 1502  BP: (!) 147/77  Pulse: (!) 117  Resp: 16  Temp: 98.1 F (36.7 C)  SpO2: 98%     Physical Exam  Constitutional: She is oriented to person, place, and time. She appears well-developed and well-nourished.  HENT:  Head: Normocephalic and atraumatic.  Eyes: Pupils are equal, round, and reactive to light. EOM are normal.  Neck: Normal range of motion. Neck supple.  Cardiovascular: Normal rate and regular rhythm.  Repeat heart rate: 84 and regular  Pulmonary/Chest: Effort normal and breath sounds normal.  Musculoskeletal:  Chronic arthritic changes to her hands, knees, and feet.  Neurological: She is alert and oriented to person, place, and time.  Skin: Skin is warm and dry. Capillary refill takes less than 2 seconds.  Psychiatric: She has a normal mood and affect. Her behavior is normal.  Vitals reviewed.  A total of 25 minutes was spent in the room with the patient, greater than 50% of which was in counseling/coordination of care regarding chronic medical problems, treatment, medication, home health care, follow-up.   ASSESSMENT & PLAN: Latoya Bennett was seen today for gout.  Diagnoses and all orders for this visit:  Chronic pain of both ankles  Primary osteoarthritis of left knee  Osteoarthritis of multiple joints, unspecified osteoarthritis type  History of gout   Patient Instructions       If you have lab work done today you will be contacted with your lab results within the next 2 weeks.  If you have not heard from [place] then  please contact us. The fastest way to get your results is to register for My Chart.   IF you received an x-ray today, you will receive an invoice from [facility]. Please contact [facility] at [phone] with questions or concerns regarding your invoice.   IF you received labwork today, you will receive an invoice from [facility]. Please contact [facility] at [phone] with questions or concerns regarding your invoice.   Our billing staff will not be able to assist you with questions regarding bills from these companies.  You will be contacted with the lab results as soon as they are available. The fastest way to get  your results is to activate your My Chart account. Instructions are located on the last page of this paperwork. If you have not heard from [place] regarding the results in 2 weeks, please contact this office.    Low-Purine Diet Purines are compounds that affect the level of uric acid in your body. A low-purine diet is a diet that is low in purines. Eating a low-purine diet can prevent the level of uric acid in your body from getting too high and causing gout or kidney stones or both. What do I need to know about this diet?  Choose low-purine foods. Examples of low-purine foods are listed in the next section.  Drink plenty of fluids, especially water. Fluids can help remove uric acid from your body. Try to drink 8-16 cups (1.9-3.8 L) a day.  Limit foods high in fat, especially saturated fat, as fat makes it harder for the body to get rid of uric acid. Foods high in saturated fat include pizza, cheese, ice cream, whole milk, fried foods, and gravies. Choose foods that are lower in fat and lean sources of protein. Use olive oil when cooking as it contains healthy fats that are not high in saturated fat.  Limit alcohol. Alcohol interferes with the elimination of uric acid from your body. If you are having a gout attack, avoid all alcohol.  Keep in mind that different  people's bodies react differently to different foods. You will probably learn over time which foods do or do not affect you. If you discover that a food tends to cause your gout to flare up, avoid eating that food. You can more freely enjoy foods that do not cause problems. If you have any questions about a food item, talk to your dietitian or health care provider. Which foods are low, moderate, and high in purines? The following is a list of foods that are low, moderate, and high in purines. You can eat any amount of the foods that are low in purines. You may be able to have small amounts of foods that are moderate in purines. Ask your health care provider how much of a food moderate in purines you can have. Avoid foods high in purines. Grains  Foods low in purines: Enriched white bread, pasta, rice, cake, cornbread, popcorn.  Foods moderate in purines: Whole-grain breads and cereals, wheat germ, bran, oatmeal. Uncooked oatmeal. Dry wheat bran or wheat germ.  Foods high in purines: Pancakes, [place] toast, biscuits, muffins. Vegetables  Foods low in purines: All vegetables, except those that are moderate in purines.  Foods moderate in purines: Asparagus, cauliflower, spinach, mushrooms, green peas. Fruits  All fruits are low in purines. Meats and other Protein Foods  Foods low in purines: Eggs, nuts, peanut butter.  Foods moderate in purines: 80-90% lean beef, lamb, veal, pork, poultry, fish, eggs, peanut butter, nuts. Crab, lobster, oysters, and shrimp. Cooked dried beans, peas, and lentils.  Foods high in purines: Anchovies, sardines, herring, mussels, tuna, codfish, scallops, trout, and haddock. Berniece Salines. Organ meats (such as liver or kidney). Tripe. Game meat. Goose. Sweetbreads. Dairy  All dairy foods are low in purines. Low-fat and fat-free dairy products are best because they are low in saturated fat. Beverages  Drinks low in purines: Water, carbonated beverages, tea, coffee,  cocoa.  Drinks moderate in purines: Soft drinks and other drinks sweetened with high-fructose corn syrup. Juices. To find whether a food or drink is sweetened with high-fructose corn syrup, look at the ingredients list.  Drinks high  in purines: Alcoholic beverages (such as beer). Condiments  Foods low in purines: Salt, herbs, olives, pickles, relishes, vinegar.  Foods moderate in purines: Butter, margarine, oils, mayonnaise. Fats and Oils  Foods low in purines: All types, except gravies and sauces made with meat.  Foods high in purines: Gravies and sauces made with meat. Other Foods  Foods low in purines: Sugars, sweets, gelatin. Cake. Soups made without meat.  Foods moderate in purines: Meat-based or fish-based soups, broths, or bouillons. Foods and drinks sweetened with high-fructose corn syrup.  Foods high in purines: High-fat desserts (such as ice cream, cookies, cakes, pies, doughnuts, and chocolate). Contact your dietitian for more information on foods that are not listed here. This information is not intended to replace advice given to you by your health care provider. Make sure you discuss any questions you have with your health care provider. Document Released: 10/18/2010 Document Revised: 11/29/2015 Document Reviewed: 05/30/2013 Elsevier Interactive Patient Education  2017 Elsevier Inc.      Agustina Caroli, MD [facility]

## 2018-05-20 ENCOUNTER — Other Ambulatory Visit: Payer: Self-pay | Admitting: *Deleted

## 2018-05-20 ENCOUNTER — Telehealth: Payer: Self-pay | Admitting: *Deleted

## 2018-05-20 DIAGNOSIS — M159 Polyosteoarthritis, unspecified: Secondary | ICD-10-CM

## 2018-05-20 DIAGNOSIS — M1712 Unilateral primary osteoarthritis, left knee: Secondary | ICD-10-CM

## 2018-05-20 DIAGNOSIS — M19071 Primary osteoarthritis, right ankle and foot: Secondary | ICD-10-CM

## 2018-05-20 DIAGNOSIS — M19072 Primary osteoarthritis, left ankle and foot: Secondary | ICD-10-CM

## 2018-05-20 NOTE — Telephone Encounter (Signed)
Spoke to patient to let her know someone from [facility] will contact her concerning getting assistance with ADL. Patient understood.

## 2018-05-20 NOTE — Telephone Encounter (Signed)
Patient was seen. 

## 2018-05-21 ENCOUNTER — Ambulatory Visit: Payer: Medicare Other | Admitting: Gastroenterology

## 2018-05-25 ENCOUNTER — Telehealth: Payer: Self-pay | Admitting: Emergency Medicine

## 2018-05-25 NOTE — Telephone Encounter (Signed)
No home health referral placed  please advise   Copied from [place] [phone]. Topic: General - Other >> May 24, 2018  2:36 PM Rayann Heman wrote: Reason for CRM: pt calling to check status of home health. Please advise

## 2018-05-26 NOTE — Telephone Encounter (Signed)
Please look into this. I believe we did place the referral. Thanks.

## 2018-05-26 NOTE — Telephone Encounter (Signed)
Dr Mitchel Honour advise. Dgaddy, CMA

## 2018-05-27 NOTE — Telephone Encounter (Signed)
Patient was contacted 05/20/2018 to let her know [facility]) will contact her concerning ADL. Patient understood.

## 2018-06-07 ENCOUNTER — Telehealth: Payer: Self-pay | Admitting: Emergency Medicine

## 2018-06-07 NOTE — Telephone Encounter (Signed)
Please review and adv     Copied from [facility] [place] [phone]. Topic: Referral - Request for Referral >> Jun 07, 2018 12:02 PM Parke Poisson wrote: Reason for CRM: Pt states that she was told at last office visit that she would getting a referral for someone to come in for light house keeping and someone to help with transportation

## 2018-06-07 NOTE — Telephone Encounter (Signed)
Copied from [place] [phone]. Topic: Referral - Request for Referral >> Jun 07, 2018 12:02 PM Parke Poisson wrote: Reason for CRM: Pt states that she was told at last office visit that she would getting a referral for someone to come in for light house keeping and someone to help with transportation

## 2018-06-08 ENCOUNTER — Ambulatory Visit: Payer: Medicare Other | Admitting: Family Medicine

## 2018-06-08 NOTE — Telephone Encounter (Signed)
Thanks for addressing this, patient needs ADL and assistance with transportation to grocery shop.

## 2018-06-08 NOTE — Telephone Encounter (Signed)
Please look into this. Thanks.

## 2018-06-09 ENCOUNTER — Telehealth: Payer: Self-pay | Admitting: *Deleted

## 2018-06-09 NOTE — Telephone Encounter (Signed)
Latoya Bennett will call patient to give her options

## 2018-06-09 NOTE — Telephone Encounter (Signed)
Spoke to patient, I advised her when I talked to her on 05/20/2018 about [facility] would contact her, I realized after checking she did not have active status with them. I gave her the phone number to [facility] for her to call to become active and maybe she can get the assistant she needed.

## 2018-06-14 ENCOUNTER — Ambulatory Visit: Payer: Self-pay

## 2018-06-14 NOTE — Telephone Encounter (Signed)
Patient called in with c/o "leg/ankle swelling." She says "I have been swelling for about 1 week, but my ankle was swollen when I came in the last time and said it was gout. I believe it's my potassium that's causing me to swell. I haven't been on it since they took me off and I usually run a low potassium. What was my potassium the last time blood work was done? I am swelling from my ankles up to my knees and in my thighs. My knees hurt, but not from the swelling. I don't know what is going on with my legs." I asked about redness, she says "no, they are not red. They are swollen and tight feeling." I asked about other symptoms, she denies SOB, CP. According to protocol, see PCP within 24 hours, no availability with PCP. Patient says "I don't have a primary. I don't want to see [name]. I want to see [name] or someone else. I offered an appointment on Wednesday, 06/16/18 at 1120 with [name]. She says "yes, I'll take that." I called the office and spoke to [place], [facility] who opened up the slot. The patient says "nevermind, his attitude will not work with mine right now. I only want to see [name]. When is she available?" I advised her first opening is on Saturday, 07/03/18, but I am not able to schedule on that day. She says "well, if that's the only day open right now, put me on the waiting list and I'll call my knee doctor to see what they recommend. I still want to see her on the 28th." I advised someone from the office will have to schedule that appointment and call with the appointment, she verbalized understanding. Care advice given and advised to go to ED if symptoms worsen, she verbalized understanding.   Reason for Disposition . [1] MODERATE leg swelling (e.g., swelling extends up to knees) AND [2] new onset or worsening  Answer Assessment - Initial Assessment Questions 1. ONSET: "When did the swelling start?" (e.g., minutes, hours, days)     1 week ago 2. LOCATION: "What part of  the leg is swollen?"  "Are both legs swollen or just one leg?"     Both ankles up to knees and up to thighs 3. SEVERITY: "How bad is the swelling?" (e.g., localized; mild, moderate, severe)  - Localized - small area of swelling localized to one leg  - MILD pedal edema - swelling limited to foot and ankle, pitting edema < 1/4 inch (6 mm) deep, rest and elevation eliminate most or all swelling  - MODERATE edema - swelling of lower leg to knee, pitting edema > 1/4 inch (6 mm) deep, rest and elevation only partially reduce swelling  - SEVERE edema - swelling extends above knee, facial or hand swelling present      Moderate 4. REDNESS: "Does the swelling look red or infected?"     No 5. PAIN: "Is the swelling painful to touch?" If so, ask: "How painful is it?"   (Scale 1-10; mild, moderate or severe)     No 6. FEVER: "Do you have a fever?" If so, ask: "What is it, how was it measured, and when did it start?"      No 7. CAUSE: "What do you think is causing the leg swelling?"     I don't know 8. MEDICAL HISTORY: "Do you have a history of heart failure, kidney disease, liver failure, or cancer?"     No 9.  RECURRENT SYMPTOM: "Have you had leg swelling before?" If so, ask: "When was the last time?" "What happened that time?"     Yes as far as my legs; left ankle always swollen 10. OTHER SYMPTOMS: "Do you have any other symptoms?" (e.g., chest pain, difficulty breathing)       No 11. PREGNANCY: "Is there any chance you are pregnant?" "When was your last menstrual period?"       No  Protocols used: LEG SWELLING AND EDEMA-A-AH

## 2018-06-18 ENCOUNTER — Ambulatory Visit: Payer: Medicare Other | Admitting: Family Medicine

## 2018-06-21 DIAGNOSIS — M7751 Other enthesopathy of right foot: Secondary | ICD-10-CM | POA: Diagnosis not present

## 2018-06-21 DIAGNOSIS — M67371 Transient synovitis, right ankle and foot: Secondary | ICD-10-CM | POA: Diagnosis not present

## 2018-06-21 DIAGNOSIS — M7752 Other enthesopathy of left foot: Secondary | ICD-10-CM | POA: Diagnosis not present

## 2018-06-21 DIAGNOSIS — M19072 Primary osteoarthritis, left ankle and foot: Secondary | ICD-10-CM | POA: Diagnosis not present

## 2018-06-21 DIAGNOSIS — M19071 Primary osteoarthritis, right ankle and foot: Secondary | ICD-10-CM | POA: Diagnosis not present

## 2018-06-21 DIAGNOSIS — M67372 Transient synovitis, left ankle and foot: Secondary | ICD-10-CM | POA: Diagnosis not present

## 2018-06-22 ENCOUNTER — Telehealth: Payer: Self-pay | Admitting: *Deleted

## 2018-06-22 NOTE — Telephone Encounter (Signed)
Faxed signed order for OT ATTN: Ruthe Mannan. Confirmation page received at 5:27 pm.

## 2018-06-23 ENCOUNTER — Telehealth: Payer: Self-pay | Admitting: *Deleted

## 2018-06-23 NOTE — Telephone Encounter (Signed)
Faxed signed order OT order: self deficit ATTN: Ruthe Mannan. Confirmation page received at 5:32 pm.

## 2018-07-04 ENCOUNTER — Encounter

## 2018-08-19 ENCOUNTER — Other Ambulatory Visit: Payer: Self-pay | Admitting: Physician Assistant

## 2018-08-19 DIAGNOSIS — I10 Essential (primary) hypertension: Secondary | ICD-10-CM

## 2018-08-19 NOTE — Telephone Encounter (Signed)
Requested Prescriptions  Pending Prescriptions Disp Refills  . amLODipine (NORVASC) 10 MG tablet [Pharmacy Med Name: AMLODIPINE BESYLATE 10MG  TABLETS] 60 tablet 0    Sig: TAKE 1 TABLET BY MOUTH DAILY     Cardiovascular:  Calcium Channel Blockers Failed - 08/19/2018  3:47 AM      Failed - Last BP in normal range    BP Readings from Last 1 Encounters:  05/19/18 (!) 147/77         Passed - Valid encounter within last 6 months    Recent Outpatient Visits          3 months ago Chronic pain of both ankles   [facility] at [facility], [place], MD   3 months ago Right foot pain   Primary Care at [facility], Arlie Solomons, MD   3 months ago History of lower GI bleeding   Primary Care at [facility], Gelene Mink, PA-C   7 months ago Pain of left hip joint   [facility] at [place], PA-C   1 year ago Chronic pain of both ankles   Primary Care at [facility], [place], MD

## 2018-11-10 ENCOUNTER — Other Ambulatory Visit: Payer: Self-pay

## 2018-11-10 ENCOUNTER — Telehealth (INDEPENDENT_AMBULATORY_CARE_PROVIDER_SITE_OTHER): Payer: Medicare Other | Admitting: Emergency Medicine

## 2018-11-10 ENCOUNTER — Other Ambulatory Visit: Payer: Self-pay | Admitting: Physician Assistant

## 2018-11-10 ENCOUNTER — Encounter: Payer: Self-pay | Admitting: Emergency Medicine

## 2018-11-10 DIAGNOSIS — M19072 Primary osteoarthritis, left ankle and foot: Secondary | ICD-10-CM

## 2018-11-10 DIAGNOSIS — M25571 Pain in right ankle and joints of right foot: Secondary | ICD-10-CM

## 2018-11-10 DIAGNOSIS — I10 Essential (primary) hypertension: Secondary | ICD-10-CM

## 2018-11-10 DIAGNOSIS — G8929 Other chronic pain: Secondary | ICD-10-CM

## 2018-11-10 DIAGNOSIS — M159 Polyosteoarthritis, unspecified: Secondary | ICD-10-CM | POA: Diagnosis not present

## 2018-11-10 DIAGNOSIS — M25572 Pain in left ankle and joints of left foot: Secondary | ICD-10-CM

## 2018-11-10 DIAGNOSIS — M19071 Primary osteoarthritis, right ankle and foot: Secondary | ICD-10-CM

## 2018-11-10 NOTE — Progress Notes (Signed)
Telemedicine Encounter- SOAP NOTE Established Patient  This telephone encounter was conducted with the patient's (or proxy's) verbal consent via audio telecommunications: yes/no: Yes Patient was instructed to have this encounter in a suitably private space; and to only have persons present to whom they give permission to participate. In addition, patient identity was confirmed by use of name plus two identifiers (DOB and address).  I discussed the limitations, risks, security and privacy concerns of performing an evaluation and management service by telephone and the availability of in person appointments. I also discussed with the patient that there may be a patient responsible charge related to this service. The patient expressed understanding and agreed to proceed.  I spent a total of TIME; 0 MIN TO 60 MIN: 10 minutes talking with the patient or their proxy.  No chief complaint on file. Pain and swelling to both ankles and knees  Subjective   Latoya Bennett is a 83 y.o. female established patient. Telephone visit today complaining of chronic pain to both ankles and knees.  Has a history of osteoarthritis with chronic pain.  Has been taking Tylenol and diclofenac gel.  Has a history of lower GI bleed so and states not recommended.  Opioids not recommended given her age and side effects.  No other symptoms or medical concerns today.  States that she so podiatrist Dr. Gershon Mussel 3 months ago and was given a cortisone shot with good results.   HPI   Patient Active Problem List   Diagnosis Date Noted  . Osteoarthritis of multiple joints 05/19/2018  . History of gout 05/19/2018  . Lower GI bleed 04/17/2018  . GI bleed 04/17/2018  . Chronic pain of both ankles 03/13/2017  . Osteoarthritis of both ankles 03/13/2017  . PAC (premature atrial contraction) 11/24/2016  . Essential hypertension 11/24/2016  . Primary osteoarthritis of left knee 11/24/2016  . Tachycardia 11/24/2016    Past  Medical History:  Diagnosis Date  . Arthritis   . GERD (gastroesophageal reflux disease)   . Hypertension     Current Outpatient Medications  Medication Sig Dispense Refill  . amLODipine (NORVASC) 10 MG tablet TAKE 1 TABLET BY MOUTH DAILY 60 tablet 0  . colchicine 0.6 MG tablet Take 1 tablet (0.6 mg total) by mouth 2 (two) times daily. Stop once gout flare improved 30 tablet 1  . diclofenac sodium (VOLTAREN) 1 % GEL Apply 2 g topically 4 (four) times daily. 1 Tube 1   No current facility-administered medications for this visit.     No Known Allergies  Social History   Socioeconomic History  . Marital status: Single    Spouse name: Not on file  . Number of children: Not on file  . Years of education: Not on file  . Highest education level: Not on file  Occupational History  . Occupation: retired  Scientific laboratory technician  . Financial resource strain: Not on file  . Food insecurity:    Worry: Not on file    Inability: Not on file  . Transportation needs:    Medical: Not on file    Non-medical: Not on file  Tobacco Use  . Smoking status: Never Smoker  . Smokeless tobacco: Never Used  Substance and Sexual Activity  . Alcohol use: Not on file  . Drug use: Not on file  . Sexual activity: Not on file  Lifestyle  . Physical activity:    Days per week: Not on file    Minutes per session: Not on file  .  Stress: Not on file  Relationships  . Social connections:    Talks on phone: Not on file    Gets together: Not on file    Attends religious service: Not on file    Active member of club or organization: Not on file    Attends meetings of clubs or organizations: Not on file    Relationship status: Not on file  . Intimate partner violence:    Fear of current or ex partner: Not on file    Emotionally abused: Not on file    Physically abused: Not on file    Forced sexual activity: Not on file  Other Topics Concern  . Not on file  Social History Narrative   Marital status: single;  not dating in 2018; moved from [place] to be near sister; not happy in [place].  Lived [place] for 50 years.      Children: no children      Lives: alone with cat      Employment: retired; Optometrist with computers      Tobacco:       Alcohol:       ADLs; independent with ADLs; drives    Review of Systems  Constitutional: Negative.  Negative for chills and fever.  HENT: Negative for congestion and sore throat.   Respiratory: Negative for cough and shortness of breath.   Cardiovascular: Negative for chest pain and palpitations.  Gastrointestinal: Negative for abdominal pain, diarrhea, nausea and vomiting.  Genitourinary: Negative for dysuria.  Musculoskeletal: Positive for joint pain.  Skin: Negative.  Negative for rash.  Neurological: Negative for dizziness and headaches.  All other systems reviewed and are negative.   Objective   Vitals as reported by the patient: None available There were no vitals filed for this visit.  Awake and oriented x3 in no apparent respiratory distress. There are no diagnoses linked to this encounter. Diagnoses and all orders for this visit:  Osteoarthritis of multiple joints, unspecified osteoarthritis type  Osteoarthritis of both ankles, unspecified osteoarthritis type  Chronic pain of both ankles   Continue diclofenac gel and Tylenol as needed. Patient did not tolerate prednisone and or tramadol in the past. NSAIDs contraindicated.  Opiates not recommended. Follow-up in the office in the next 3 to 6 months.  I discussed the assessment and treatment plan with the patient. The patient was provided an opportunity to ask questions and all were answered. The patient agreed with the plan and demonstrated an understanding of the instructions.   The patient was advised to call back or seek an in-person evaluation if the symptoms worsen or if the condition fails to improve as anticipated.  I provided 10 minutes of non-face-to-face time during  this encounter.  Horald Pollen, MD  Primary Care at [facility]

## 2018-11-10 NOTE — Progress Notes (Signed)
Contacted patient to triage for appointment. Patient complaining of left ankle and knee swelling for some time. Per patient she would like something for pain if possible.

## 2018-12-27 ENCOUNTER — Telehealth: Payer: Self-pay | Admitting: Emergency Medicine

## 2018-12-27 NOTE — Telephone Encounter (Signed)
Called pt back to reschedule and pt hung up.

## 2018-12-27 NOTE — Telephone Encounter (Signed)
Pt wanted to schedule an appointment - fell and has back pain/wants referral for x-ray/med refills  Call disconnected before appt was made

## 2018-12-30 DIAGNOSIS — M67372 Transient synovitis, left ankle and foot: Secondary | ICD-10-CM | POA: Diagnosis not present

## 2018-12-30 DIAGNOSIS — M67371 Transient synovitis, right ankle and foot: Secondary | ICD-10-CM | POA: Diagnosis not present

## 2019-01-03 ENCOUNTER — Other Ambulatory Visit: Payer: Self-pay | Admitting: Physician Assistant

## 2019-01-03 DIAGNOSIS — I10 Essential (primary) hypertension: Secondary | ICD-10-CM

## 2019-01-03 NOTE — Telephone Encounter (Signed)
Would [name] be taking over this rx?

## 2019-01-04 ENCOUNTER — Other Ambulatory Visit: Payer: Self-pay | Admitting: Emergency Medicine

## 2019-01-04 DIAGNOSIS — I10 Essential (primary) hypertension: Secondary | ICD-10-CM

## 2019-01-13 ENCOUNTER — Ambulatory Visit (INDEPENDENT_AMBULATORY_CARE_PROVIDER_SITE_OTHER): Payer: Medicare Other

## 2019-01-13 ENCOUNTER — Other Ambulatory Visit: Payer: Self-pay

## 2019-01-13 ENCOUNTER — Encounter: Payer: Self-pay | Admitting: Family Medicine

## 2019-01-13 ENCOUNTER — Ambulatory Visit (INDEPENDENT_AMBULATORY_CARE_PROVIDER_SITE_OTHER): Payer: Medicare Other | Admitting: Family Medicine

## 2019-01-13 VITALS — BP 126/60 | HR 95 | Temp 98.8°F | Resp 16 | Ht 59.0 in | Wt 124.8 lb

## 2019-01-13 DIAGNOSIS — W19XXXA Unspecified fall, initial encounter: Secondary | ICD-10-CM | POA: Diagnosis not present

## 2019-01-13 DIAGNOSIS — I10 Essential (primary) hypertension: Secondary | ICD-10-CM | POA: Diagnosis not present

## 2019-01-13 DIAGNOSIS — M79622 Pain in left upper arm: Secondary | ICD-10-CM

## 2019-01-13 DIAGNOSIS — M25512 Pain in left shoulder: Secondary | ICD-10-CM

## 2019-01-13 DIAGNOSIS — S4992XA Unspecified injury of left shoulder and upper arm, initial encounter: Secondary | ICD-10-CM | POA: Diagnosis not present

## 2019-01-13 MED ORDER — MELOXICAM 7.5 MG PO TABS: 7.5000 mg | ORAL_TABLET | Freq: Every day | ORAL | 0 refills | Status: DC

## 2019-01-13 NOTE — Progress Notes (Signed)
7/9/20202:29 PM  Latoya Bennett 01-02-1936, 83 y.o., female [id number]  Chief Complaint  Patient presents with  . Fall    x 1 week ago   . Arm Pain    LEFT and the shoulders    HPI:   Patient is a 83 y.o. female  who presents today for left shoulder and arm pain after a fall a week ago  A week ago she was outside trying to hang something overhead and lost her balance landing on her left side hitting her left shoulder and arm She now cant reach across or raise it Uses a cane Lives alone  She has known OA of multiple joints Has ankle swelling, L > R , stays swollen Saw podiatrist at [facility] Did steroid injections, refused CAM walker  Dx OA, synovitis, capsulitis  Last crt 0.14 May 2018  Depression screen [facility] 2/9 01/13/2019 11/10/2018 05/19/2018  Decreased Interest 0 0 0  Down, Depressed, Hopeless 0 0 0  PHQ - 2 Score 0 0 0    Fall Risk  01/13/2019 11/10/2018 05/19/2018 05/07/2018 04/27/2018  Falls in the past year? 1 1 1  0 Yes  Comment - - - - fell last wk, per pt didn't hurt myself  Number falls in past yr: 1 1 1  - -  Injury with Fall? 1 0 1 - -  Comment per patient injury to left arm and the shoulders - - - -  Follow up Falls evaluation completed Falls evaluation completed - - -     No Known Allergies  Prior to Admission medications   Medication Sig Start Date End Date Taking? Authorizing Provider  amLODipine (NORVASC) 10 MG tablet TAKE 1 TABLET BY MOUTH DAILY 01/04/19  Yes Sagardia, Ines Bloomer, MD  colchicine 0.6 MG tablet Take 1 tablet (0.6 mg total) by mouth 2 (two) times daily. Stop once gout flare improved 05/07/18  Yes Stallings, Zoe A, MD  diclofenac sodium (VOLTAREN) 1 % GEL Apply 2 g topically 4 (four) times daily. 04/20/18  Yes Shelly Coss, MD    Past Medical History:  Diagnosis Date  . Arthritis   . GERD (gastroesophageal reflux disease)   . Hypertension     Past Surgical History:  Procedure Laterality Date  . ABDOMINAL  HYSTERECTOMY    . ESOPHAGOGASTRODUODENOSCOPY (EGD) WITH PROPOFOL Left 04/18/2018   Procedure: ESOPHAGOGASTRODUODENOSCOPY (EGD) WITH PROPOFOL;  Surgeon: Arta Silence, MD;  Location: [facility];  Service: Endoscopy;  Laterality: Left;    Social History   Tobacco Use  . Smoking status: Never Smoker  . Smokeless tobacco: Never Used  Substance Use Topics  . Alcohol use: Not on file    Family History  Problem Relation Age of Onset  . Hypertension Sister     Review of Systems  Constitutional: Negative for chills and fever.  Respiratory: Negative for cough and shortness of breath.   Cardiovascular: Positive for leg swelling. Negative for chest pain and palpitations.  Gastrointestinal: Negative for abdominal pain, nausea and vomiting.  per hpi   OBJECTIVE:  Today's Vitals   01/13/19 1351  BP: (!) 152/75  Pulse: 95  Resp: 16  Temp: 98.8 F (37.1 C)  TempSrc: Oral  SpO2: 97%  Weight: 124 lb 12.8 oz (56.6 kg)  Height: 4\' 11"  (1.499 m)   Body mass index is 25.21 kg/m.  BP Readings from Last 3 Encounters:  01/13/19 (!) 152/75  05/19/18 (!) 147/77  05/07/18 108/61    Physical Exam Vitals signs and  nursing note reviewed.  Constitutional:      Appearance: She is well-developed.  HENT:     Head: Normocephalic and atraumatic.     Mouth/Throat:     Pharynx: No oropharyngeal exudate.  Eyes:     General: No scleral icterus.    Conjunctiva/sclera: Conjunctivae normal.     Pupils: Pupils are equal, round, and reactive to light.  Neck:     Musculoskeletal: Neck supple.  Cardiovascular:     Rate and Rhythm: Normal rate and regular rhythm.     Heart sounds: Normal heart sounds. No murmur. No friction rub. No gallop.   Pulmonary:     Effort: Pulmonary effort is normal.     Breath sounds: Normal breath sounds. No wheezing or rales.  Musculoskeletal:     Right lower leg: Edema present.     Left lower leg: Edema present.  Skin:    General: Skin is warm and dry.   Neurological:     Mental Status: She is alert and oriented to person, place, and time.     Dg Shoulder Left  Result Date: 01/13/2019 CLINICAL DATA:  Fall 2 days ago.  Pain. EXAM: LEFT SHOULDER - 2+ VIEW COMPARISON:  01/01/2018 FINDINGS: No fracture. No shoulder separation or dislocation. Degenerative changes are seen in the glenohumeral joint and in the acromioclavicular joint. IMPRESSION: Degenerative changes without acute bony abnormality. Electronically Signed   By: Misty Stanley M.D.   On: 01/13/2019 15:06   Dg Humerus Left  Result Date: 01/13/2019 CLINICAL DATA:  Fall 1 week ago.  Pain. EXAM: LEFT HUMERUS - 2+ VIEW COMPARISON:  No comparison studies available. FINDINGS: No fracture. No worrisome lytic or sclerotic osseous abnormality. Degenerative changes are noted in the glenohumeral and acromioclavicular joints. IMPRESSION: Degenerative changes without acute bony abnormality. Electronically Signed   By: Misty Stanley M.D.   On: 01/13/2019 14:55     ASSESSMENT and PLAN  1. Pain of left upper arm 2. Acute pain of left shoulder Discussed supportive measures, new meds r/se/b and RTC precautions. Patient educational handout given. - DG Humerus Left; Future - DG Shoulder Left; Future  3. Fall, initial encounter - DG Shoulder Left; Future - DG Humerus Left; Future  4. Essential hypertension Controlled. Continue current regime.  - Care order/instruction:  Other orders - meloxicam (MOBIC) 7.5 MG tablet; Take 1 tablet (7.5 mg total) by mouth daily.  Return if symptoms worsen or fail to improve.    Rutherford Guys, MD Primary Care at [facility] [place], [place] [address] Ph.  [phone] Fax [phone]

## 2019-01-13 NOTE — Patient Instructions (Addendum)
   If you have lab work done today you will be contacted with your lab results within the next 2 weeks.  If you have not heard from us then please contact us. The fastest way to get your results is to register for My Chart.   IF you received an x-ray today, you will receive an invoice from [facility]. Please contact  Radiology at [phone] with questions or concerns regarding your invoice.   IF you received labwork today, you will receive an invoice from [facility]. Please contact [facility] at [phone] with questions or concerns regarding your invoice.   Our billing staff will not be able to assist you with questions regarding bills from these companies.  You will be contacted with the lab results as soon as they are available. The fastest way to get your results is to activate your My Chart account. Instructions are located on the last page of this paperwork. If you have not heard from us regarding the results in 2 weeks, please contact this office.     Shoulder Pain Many things can cause shoulder pain, including:  An injury.  Moving the shoulder in the same way again and again (overuse).  Joint pain (arthritis). Pain can come from:  Swelling and irritation (inflammation) of any part of the shoulder.  An injury to the shoulder joint.  An injury to: ? Tissues that connect muscle to bone (tendons). ? Tissues that connect bones to each other (ligaments). ? Bones. Follow these instructions at home: Watch for changes in your symptoms. Let your doctor know about them. Follow these instructions to help with your pain. If you have a sling:  Wear the sling as told by your doctor. Remove it only as told by your doctor.  Loosen the sling if your fingers: ? Tingle. ? Become numb. ? Turn cold and blue.  Keep the sling clean.  If the sling is not waterproof: ? Do not let it get wet. ? Take the sling off when you shower or bathe. Managing pain, stiffness,  and swelling   If told, put ice on the painful area: ? Put ice in a plastic bag. ? Place a towel between your skin and the bag. ? Leave the ice on for 20 minutes, 2-3 times a day. Stop putting ice on if it does not help with the pain.  Squeeze a soft ball or a foam pad as much as possible. This prevents swelling in the shoulder. It also helps to strengthen the arm. General instructions  Take over-the-counter and prescription medicines only as told by your doctor.  Keep all follow-up visits as told by your doctor. This is important. Contact a doctor if:  Your pain gets worse.  Medicine does not help your pain.  You have new pain in your arm, hand, or fingers. Get help right away if:  Your arm, hand, or fingers: ? Tingle. ? Are numb. ? Are swollen. ? Are painful. ? Turn white or blue. Summary  Shoulder pain can be caused by many things. These include injury, moving the shoulder in the same away again and again, and joint pain.  Watch for changes in your symptoms. Let your doctor know about them.  This condition may be treated with a sling, ice, and pain medicine.  Contact your doctor if the pain gets worse or you have new pain. Get help right away if your arm, hand, or fingers tingle or get numb, swollen, or painful.  Keep all follow-up visits   as told by your doctor. This is important. This information is not intended to replace advice given to you by your health care provider. Make sure you discuss any questions you have with your health care provider. Document Released: 12/10/2007 Document Revised: 01/05/2018 Document Reviewed: 01/05/2018 Elsevier Patient Education  2020 [employer].

## 2019-01-15 ENCOUNTER — Other Ambulatory Visit: Payer: Self-pay | Admitting: Family Medicine

## 2019-02-06 ENCOUNTER — Other Ambulatory Visit: Payer: Self-pay | Admitting: Emergency Medicine

## 2019-02-06 DIAGNOSIS — I10 Essential (primary) hypertension: Secondary | ICD-10-CM

## 2019-02-06 NOTE — Telephone Encounter (Signed)
Requested Prescriptions  Pending Prescriptions Disp Refills  . amLODipine (NORVASC) 10 MG tablet [Pharmacy Med Name: AMLODIPINE BESYLATE 10MG  TABLETS] 90 tablet 0    Sig: TAKE 1 TABLET BY MOUTH DAILY     Cardiovascular:  Calcium Channel Blockers Passed - 02/06/2019  9:15 AM      Passed - Last BP in normal range    BP Readings from Last 1 Encounters:  01/13/19 126/60         Passed - Valid encounter within last 6 months    Recent Outpatient Visits          3 weeks ago Pain of left upper arm   [facility] at [facility], Lilia Argue, MD   8 months ago Chronic pain of both ankles   Primary Care at [facility], [place], MD   9 months ago Right foot pain   Primary Care at [facility], Arlie Solomons, MD   9 months ago History of lower GI bleeding   Primary Care at [facility], Gelene Mink, PA-C   1 year ago Pain of left hip joint   Primary Care at [facility], PA-C

## 2019-02-09 ENCOUNTER — Ambulatory Visit: Payer: Self-pay | Admitting: *Deleted

## 2019-02-09 NOTE — Telephone Encounter (Signed)
Pt calling stating that she experienced a stool that was bright red with clots approximately 3 hours ago. Pt states it occurred one time today. Pt denies any abdominal pain, dizziness or other symptoms a this time. Pt states she is not currently on any blood thinners. Pt advised to call 911 and seek treatment in the ED if symptoms become worse and/or if she has another episode of bloody stools tonight. Pt verbalized understanding. Pt can be contacted at [phone] to be scheduled for an appt.   Reason for Disposition . MODERATE rectal bleeding (small blood clots, passing blood without stool, or toilet water turns red)  Answer Assessment - Initial Assessment Questions 1. APPEARANCE of BLOOD: "What color is it?" "Is it passed separately, on the surface of the stool, or mixed in with the stool?"      Bright red with dark clots, just blood 2. AMOUNT: "How much blood was passed?"      Quite a bit 3. FREQUENCY: "How many times has blood been passed with the stools?"      Only once 4. ONSET: "When was the blood first seen in the stools?" (Days or weeks)      About 3 hours ago and one 5. DIARRHEA: "Is there also some diarrhea?" If so, ask: "How many diarrhea stools were passed in past 24 hours?"      Stomach was upset and wiped it was blood a little loose 6. CONSTIPATION: "Do you have constipation?" If so, "How bad is it?"     No 7. RECURRENT SYMPTOMS: "Have you had blood in your stools before?" If so, ask: "When was the last time?" and "What happened that time?"      Happened about 4 months ago 8. BLOOD THINNERS: "Do you take any blood thinners?" (e.g., Coumadin/warfarin, Pradaxa/dabigatran, aspirin)     No 9. OTHER SYMPTOMS: "Do you have any other symptoms?"  (e.g., abdominal pain, vomiting, dizziness, fever)     Stomach was upset earlier but no symptoms now 10. PREGNANCY: "Is there any chance you are pregnant?" "When was your last menstrual period?"       n/a  Protocols used: RECTAL  BLEEDING-A-AH

## 2019-02-09 NOTE — Telephone Encounter (Signed)
pls schedule pt for appt

## 2019-02-11 ENCOUNTER — Encounter (HOSPITAL_COMMUNITY): Payer: Self-pay | Admitting: *Deleted

## 2019-02-11 ENCOUNTER — Inpatient Hospital Stay (HOSPITAL_COMMUNITY)
Admission: EM | Admit: 2019-02-11 | Discharge: 2019-02-17 | DRG: 378 | Disposition: A | Discharge status: Home / Self Care | Payer: Medicare Other | Attending: Internal Medicine | Admitting: Internal Medicine

## 2019-02-11 ENCOUNTER — Other Ambulatory Visit: Payer: Self-pay

## 2019-02-11 DIAGNOSIS — K625 Hemorrhage of anus and rectum: Secondary | ICD-10-CM | POA: Diagnosis not present

## 2019-02-11 DIAGNOSIS — K861 Other chronic pancreatitis: Secondary | ICD-10-CM | POA: Diagnosis present

## 2019-02-11 DIAGNOSIS — K635 Polyp of colon: Secondary | ICD-10-CM | POA: Diagnosis present

## 2019-02-11 DIAGNOSIS — K5731 Diverticulosis of large intestine without perforation or abscess with bleeding: Secondary | ICD-10-CM | POA: Diagnosis not present

## 2019-02-11 DIAGNOSIS — I491 Atrial premature depolarization: Secondary | ICD-10-CM | POA: Diagnosis present

## 2019-02-11 DIAGNOSIS — E876 Hypokalemia: Secondary | ICD-10-CM | POA: Diagnosis present

## 2019-02-11 DIAGNOSIS — Z8249 Family history of ischemic heart disease and other diseases of the circulatory system: Secondary | ICD-10-CM

## 2019-02-11 DIAGNOSIS — R55 Syncope and collapse: Secondary | ICD-10-CM | POA: Diagnosis present

## 2019-02-11 DIAGNOSIS — K219 Gastro-esophageal reflux disease without esophagitis: Secondary | ICD-10-CM | POA: Diagnosis present

## 2019-02-11 DIAGNOSIS — K922 Gastrointestinal hemorrhage, unspecified: Secondary | ICD-10-CM | POA: Diagnosis present

## 2019-02-11 DIAGNOSIS — Z791 Long term (current) use of non-steroidal anti-inflammatories (NSAID): Secondary | ICD-10-CM

## 2019-02-11 DIAGNOSIS — D62 Acute posthemorrhagic anemia: Secondary | ICD-10-CM | POA: Diagnosis not present

## 2019-02-11 DIAGNOSIS — Z20828 Contact with and (suspected) exposure to other viral communicable diseases: Secondary | ICD-10-CM | POA: Diagnosis not present

## 2019-02-11 DIAGNOSIS — Z90710 Acquired absence of both cervix and uterus: Secondary | ICD-10-CM

## 2019-02-11 DIAGNOSIS — M109 Gout, unspecified: Secondary | ICD-10-CM | POA: Diagnosis not present

## 2019-02-11 DIAGNOSIS — D696 Thrombocytopenia, unspecified: Secondary | ICD-10-CM | POA: Diagnosis not present

## 2019-02-11 DIAGNOSIS — Z79899 Other long term (current) drug therapy: Secondary | ICD-10-CM

## 2019-02-11 DIAGNOSIS — D649 Anemia, unspecified: Secondary | ICD-10-CM | POA: Diagnosis present

## 2019-02-11 DIAGNOSIS — Z03818 Encounter for observation for suspected exposure to other biological agents ruled out: Secondary | ICD-10-CM | POA: Diagnosis not present

## 2019-02-11 DIAGNOSIS — I10 Essential (primary) hypertension: Secondary | ICD-10-CM | POA: Diagnosis present

## 2019-02-11 DIAGNOSIS — R739 Hyperglycemia, unspecified: Secondary | ICD-10-CM | POA: Diagnosis not present

## 2019-02-11 HISTORY — DX: Gastrointestinal hemorrhage, unspecified: K92.2

## 2019-02-11 HISTORY — DX: Diverticulitis of intestine, part unspecified, without perforation or abscess without bleeding: K57.92

## 2019-02-11 LAB — CBC
HCT: 24.9 % — ABNORMAL LOW (ref 36.0–46.0)
Hemoglobin: 8.7 g/dL — ABNORMAL LOW (ref 12.0–15.0)
MCH: 25.7 pg — ABNORMAL LOW (ref 26.0–34.0)
MCHC: 34.9 g/dL (ref 30.0–36.0)
MCV: 73.5 fL — ABNORMAL LOW (ref 80.0–100.0)
Platelets: 171 10*3/uL (ref 150–400)
RBC: 3.39 MIL/uL — ABNORMAL LOW (ref 3.87–5.11)
RDW: 14.1 % (ref 11.5–15.5)
WBC: 6.7 10*3/uL (ref 4.0–10.5)
nRBC: 0 % (ref 0.0–0.2)

## 2019-02-11 LAB — COMPREHENSIVE METABOLIC PANEL
ALT: 15 U/L (ref 0–44)
AST: 17 U/L (ref 15–41)
Albumin: 3.3 g/dL — ABNORMAL LOW (ref 3.5–5.0)
Alkaline Phosphatase: 65 U/L (ref 38–126)
Anion gap: 7 (ref 5–15)
BUN: 17 mg/dL (ref 8–23)
CO2: 25 mmol/L (ref 22–32)
Calcium: 9.5 mg/dL (ref 8.9–10.3)
Chloride: 106 mmol/L (ref 98–111)
Creatinine, Ser: 0.9 mg/dL (ref 0.44–1.00)
GFR calc Af Amer: >60 mL/min (ref >60)
GFR calc non Af Amer: 60 mL/min — ABNORMAL LOW (ref >60)
Glucose, Bld: 175 mg/dL — ABNORMAL HIGH (ref 70–99)
Potassium: 3.4 mmol/L — ABNORMAL LOW (ref 3.5–5.1)
Sodium: 138 mmol/L (ref 135–145)
Total Bilirubin: 0.6 mg/dL (ref 0.3–1.2)
Total Protein: 5.5 g/dL — ABNORMAL LOW (ref 6.5–8.1)

## 2019-02-11 NOTE — ED Provider Notes (Signed)
[facility] Provider Note   CSN: [id number] Arrival date & time: 02/11/19  1529     History   Chief Complaint Chief Complaint  Patient presents with  . Rectal Bleeding    HPI Latoya Bennett is a 83 y.o. female.     Patient with PMH of diverticulitis, GI bleed, HTN, presents to the ED with a chief compliant of rectal bleeding.  She states that over the past 2 days she has had blood in her stools, which she says is dark red.  She states that yesterday she stood up and felt dizzy and fell backward.  She states that she has not had any bleeding today.  She is requesting GI follow-up.  She is not anticoagulated.  The history is provided by the patient. No language interpreter was used.    Past Medical History:  Diagnosis Date  . Arthritis   . Diverticulitis   . GERD (gastroesophageal reflux disease)   . GI bleed   . Hypertension     Patient Active Problem List   Diagnosis Date Noted  . Osteoarthritis of multiple joints 05/19/2018  . History of gout 05/19/2018  . Lower GI bleed 04/17/2018  . GI bleed 04/17/2018  . Chronic pain of both ankles 03/13/2017  . Osteoarthritis of both ankles 03/13/2017  . PAC (premature atrial contraction) 11/24/2016  . Essential hypertension 11/24/2016  . Primary osteoarthritis of left knee 11/24/2016  . Tachycardia 11/24/2016    Past Surgical History:  Procedure Laterality Date  . ABDOMINAL HYSTERECTOMY    . ESOPHAGOGASTRODUODENOSCOPY (EGD) WITH PROPOFOL Left 04/18/2018   Procedure: ESOPHAGOGASTRODUODENOSCOPY (EGD) WITH PROPOFOL;  Surgeon: Arta Silence, MD;  Location: [facility];  Service: Endoscopy;  Laterality: Left;     OB History   No obstetric history on file.      Home Medications    Prior to Admission medications   Medication Sig Start Date End Date Taking? Authorizing Provider  amLODipine (NORVASC) 10 MG tablet TAKE 1 TABLET BY MOUTH DAILY 02/06/19   Horald Pollen,  MD  colchicine 0.6 MG tablet Take 1 tablet (0.6 mg total) by mouth 2 (two) times daily. Stop once gout flare improved 05/07/18   Delia Chimes A, MD  diclofenac sodium (VOLTAREN) 1 % GEL Apply 2 g topically 4 (four) times daily. 04/20/18   Shelly Coss, MD  meloxicam (MOBIC) 7.5 MG tablet Take 1 tablet (7.5 mg total) by mouth daily. 01/13/19   Rutherford Guys, MD    Family History Family History  Problem Relation Age of Onset  . Hypertension Sister     Social History Social History   Tobacco Use  . Smoking status: Never Smoker  . Smokeless tobacco: Never Used  Substance Use Topics  . Alcohol use: Not on file  . Drug use: Never     Allergies   Patient has no known allergies.   Review of Systems Review of Systems  All other systems reviewed and are negative.    Physical Exam Updated Vital Signs BP (!) 112/95   Pulse 79   Temp 97.9 F (36.6 C) (Oral)   Resp 18   Ht 5\' 4"  (1.626 m)   Wt 56.2 kg   SpO2 93%   BMI 21.28 kg/m   Physical Exam Vitals signs and nursing note reviewed.  Constitutional:      General: She is not in acute distress.    Appearance: She is well-developed.  HENT:     Head: Normocephalic and  atraumatic.  Eyes:     Conjunctiva/sclera: Conjunctivae normal.  Neck:     Musculoskeletal: Neck supple.  Cardiovascular:     Rate and Rhythm: Normal rate and regular rhythm.     Heart sounds: No murmur.  Pulmonary:     Effort: Pulmonary effort is normal. No respiratory distress.     Breath sounds: Normal breath sounds.  Abdominal:     Palpations: Abdomen is soft.     Tenderness: There is no abdominal tenderness.  Musculoskeletal: Normal range of motion.  Skin:    General: Skin is warm and dry.  Neurological:     Mental Status: She is alert and oriented to person, place, and time.  Psychiatric:        Mood and Affect: Mood normal.        Behavior: Behavior normal.      ED Treatments / Results  Labs (all labs ordered are listed, but  only abnormal results are displayed) Labs Reviewed  COMPREHENSIVE METABOLIC PANEL - Abnormal; Notable for the following components:      Result Value   Potassium 3.4 (*)    Glucose, Bld 175 (*)    Total Protein 5.5 (*)    Albumin 3.3 (*)    GFR calc non Af Amer 60 (*)    All other components within normal limits  CBC - Abnormal; Notable for the following components:   RBC 3.39 (*)    Hemoglobin 8.7 (*)    HCT 24.9 (*)    MCV 73.5 (*)    MCH 25.7 (*)    All other components within normal limits  POC OCCULT BLOOD, ED  TYPE AND SCREEN    EKG EKG Interpretation  Date/Time:  Friday February 11 2019 15:44:05 EDT Ventricular Rate:  117 PR Interval:  152 QRS Duration: 74 QT Interval:  338 QTC Calculation: 471 R Axis:   15 Text Interpretation:  Sinus tachycardia with Premature atrial complexes Otherwise normal ECG Low voltage QRS When compared with ECG of 04/17/2018, No significant change was found Confirmed by Delora Fuel ([id number]) on 02/11/2019 10:56:56 PM   Radiology No results found.  Procedures Procedures (including critical care time)  Medications Ordered in ED Medications - No data to display   Initial Impression / Assessment and Plan / ED Course  I have reviewed the triage vital signs and the nursing notes.  Pertinent labs & imaging results that were available during my care of the patient were reviewed by me and considered in my medical decision making (see chart for details).        Patient with rectal bleeding x 2 days.  HGB is 8.7, lower than normal for her.  Has hx of diverticular bleeding.  Doesn't see GI.  Questionable syncopal episode yesterday.  Dizzy upon standing.  Seen by and discussed with Dr. Betsey Holiday, who agrees with plan for admission.  Appreciate Dr. Blaine Hamper for admitting.  Final Clinical Impressions(s) / ED Diagnoses   Final diagnoses:  Rectal bleeding    ED Discharge Orders    None       Montine Circle, PA-C 02/12/19 0042    Orpah Greek, MD 02/12/19 [phone]

## 2019-02-11 NOTE — ED Triage Notes (Addendum)
Pt states 2 episodes of bloody stool - one today and one yesterday.  States hx of diverticulosis.  Pt states dizziness when she stands.

## 2019-02-12 DIAGNOSIS — M109 Gout, unspecified: Secondary | ICD-10-CM | POA: Diagnosis present

## 2019-02-12 DIAGNOSIS — K5731 Diverticulosis of large intestine without perforation or abscess with bleeding: Principal | ICD-10-CM

## 2019-02-12 DIAGNOSIS — K625 Hemorrhage of anus and rectum: Secondary | ICD-10-CM | POA: Diagnosis present

## 2019-02-12 DIAGNOSIS — E876 Hypokalemia: Secondary | ICD-10-CM | POA: Diagnosis present

## 2019-02-12 DIAGNOSIS — K922 Gastrointestinal hemorrhage, unspecified: Secondary | ICD-10-CM

## 2019-02-12 DIAGNOSIS — I10 Essential (primary) hypertension: Secondary | ICD-10-CM | POA: Diagnosis present

## 2019-02-12 DIAGNOSIS — D696 Thrombocytopenia, unspecified: Secondary | ICD-10-CM | POA: Diagnosis not present

## 2019-02-12 DIAGNOSIS — D62 Acute posthemorrhagic anemia: Secondary | ICD-10-CM

## 2019-02-12 DIAGNOSIS — K5730 Diverticulosis of large intestine without perforation or abscess without bleeding: Secondary | ICD-10-CM | POA: Diagnosis not present

## 2019-02-12 DIAGNOSIS — Z90710 Acquired absence of both cervix and uterus: Secondary | ICD-10-CM | POA: Diagnosis not present

## 2019-02-12 DIAGNOSIS — K219 Gastro-esophageal reflux disease without esophagitis: Secondary | ICD-10-CM | POA: Diagnosis present

## 2019-02-12 DIAGNOSIS — I491 Atrial premature depolarization: Secondary | ICD-10-CM | POA: Diagnosis present

## 2019-02-12 DIAGNOSIS — D649 Anemia, unspecified: Secondary | ICD-10-CM | POA: Diagnosis present

## 2019-02-12 DIAGNOSIS — K5791 Diverticulosis of intestine, part unspecified, without perforation or abscess with bleeding: Secondary | ICD-10-CM | POA: Diagnosis not present

## 2019-02-12 DIAGNOSIS — R739 Hyperglycemia, unspecified: Secondary | ICD-10-CM | POA: Diagnosis not present

## 2019-02-12 DIAGNOSIS — Z79899 Other long term (current) drug therapy: Secondary | ICD-10-CM | POA: Diagnosis not present

## 2019-02-12 DIAGNOSIS — D122 Benign neoplasm of ascending colon: Secondary | ICD-10-CM | POA: Diagnosis not present

## 2019-02-12 DIAGNOSIS — Z791 Long term (current) use of non-steroidal anti-inflammatories (NSAID): Secondary | ICD-10-CM | POA: Diagnosis not present

## 2019-02-12 DIAGNOSIS — D123 Benign neoplasm of transverse colon: Secondary | ICD-10-CM | POA: Diagnosis not present

## 2019-02-12 DIAGNOSIS — Z20828 Contact with and (suspected) exposure to other viral communicable diseases: Secondary | ICD-10-CM | POA: Diagnosis present

## 2019-02-12 DIAGNOSIS — Z8249 Family history of ischemic heart disease and other diseases of the circulatory system: Secondary | ICD-10-CM | POA: Diagnosis not present

## 2019-02-12 DIAGNOSIS — R55 Syncope and collapse: Secondary | ICD-10-CM | POA: Diagnosis present

## 2019-02-12 DIAGNOSIS — K635 Polyp of colon: Secondary | ICD-10-CM | POA: Diagnosis present

## 2019-02-12 DIAGNOSIS — K861 Other chronic pancreatitis: Secondary | ICD-10-CM | POA: Diagnosis present

## 2019-02-12 HISTORY — DX: Gastrointestinal hemorrhage, unspecified: K92.2

## 2019-02-12 LAB — CBC
HCT: 24.8 % — ABNORMAL LOW (ref 36.0–46.0)
HCT: 24.3 % — ABNORMAL LOW (ref 36.0–46.0)
Hemoglobin: 8.5 g/dL — ABNORMAL LOW (ref 12.0–15.0)
Hemoglobin: 8.5 g/dL — ABNORMAL LOW (ref 12.0–15.0)
MCH: 25.8 pg — ABNORMAL LOW (ref 26.0–34.0)
MCH: 26.2 pg (ref 26.0–34.0)
MCHC: 34.3 g/dL (ref 30.0–36.0)
MCHC: 35 g/dL (ref 30.0–36.0)
MCV: 75.4 fL — ABNORMAL LOW (ref 80.0–100.0)
MCV: 75 fL — ABNORMAL LOW (ref 80.0–100.0)
Platelets: 142 10*3/uL — ABNORMAL LOW (ref 150–400)
Platelets: 150 10*3/uL (ref 150–400)
RBC: 3.29 MIL/uL — ABNORMAL LOW (ref 3.87–5.11)
RBC: 3.24 MIL/uL — ABNORMAL LOW (ref 3.87–5.11)
RDW: 15.3 % (ref 11.5–15.5)
RDW: 14.8 % (ref 11.5–15.5)
WBC: 6.9 10*3/uL (ref 4.0–10.5)
WBC: 5.5 10*3/uL (ref 4.0–10.5)
nRBC: 0 % (ref 0.0–0.2)
nRBC: 0 % (ref 0.0–0.2)

## 2019-02-12 LAB — VITAMIN B12: Vitamin B-12: 1061 pg/mL — ABNORMAL HIGH (ref 180–914)

## 2019-02-12 LAB — IRON AND TIBC
Iron: 128 ug/dL (ref 28–170)
Saturation Ratios: 47 % — ABNORMAL HIGH (ref 10.4–31.8)
TIBC: 272 ug/dL (ref 250–450)
UIBC: 144 ug/dL

## 2019-02-12 LAB — RETICULOCYTES
Immature Retic Fract: 15.2 % (ref 2.3–15.9)
RBC.: 3.24 MIL/uL — ABNORMAL LOW (ref 3.87–5.11)
Retic Count, Absolute: 78.1 10*3/uL (ref 19.0–186.0)
Retic Ct Pct: 2.4 % (ref 0.4–3.1)

## 2019-02-12 LAB — BASIC METABOLIC PANEL
Anion gap: 6 (ref 5–15)
BUN: 16 mg/dL (ref 8–23)
CO2: 27 mmol/L (ref 22–32)
Calcium: 9.3 mg/dL (ref 8.9–10.3)
Chloride: 107 mmol/L (ref 98–111)
Creatinine, Ser: 0.86 mg/dL (ref 0.44–1.00)
GFR calc Af Amer: >60 mL/min (ref >60)
GFR calc non Af Amer: >60 mL/min (ref >60)
Glucose, Bld: 108 mg/dL — ABNORMAL HIGH (ref 70–99)
Potassium: 3.2 mmol/L — ABNORMAL LOW (ref 3.5–5.1)
Sodium: 140 mmol/L (ref 135–145)

## 2019-02-12 LAB — PROTIME-INR
INR: 1.2 (ref 0.8–1.2)
Prothrombin Time: 15 seconds (ref 11.4–15.2)

## 2019-02-12 LAB — APTT: aPTT: 30 seconds (ref 24–36)

## 2019-02-12 LAB — GLUCOSE, CAPILLARY: Glucose-Capillary: 90 mg/dL (ref 70–99)

## 2019-02-12 LAB — MAGNESIUM: Magnesium: 1.9 mg/dL (ref 1.7–2.4)

## 2019-02-12 LAB — PREPARE RBC (CROSSMATCH)

## 2019-02-12 LAB — FERRITIN: Ferritin: 22 ng/mL (ref 11–307)

## 2019-02-12 LAB — SARS CORONAVIRUS 2 BY RT PCR (HOSPITAL ORDER, PERFORMED IN ~~LOC~~ HOSPITAL LAB): SARS Coronavirus 2: NEGATIVE

## 2019-02-12 LAB — FOLATE: Folate: 23.1 ng/mL (ref >5.9)

## 2019-02-12 MED ORDER — SODIUM CHLORIDE 0.9 % IV SOLN: INTRAVENOUS | Status: DC

## 2019-02-12 MED ORDER — PANTOPRAZOLE SODIUM 40 MG PO TBEC
40.0000 mg | DELAYED_RELEASE_TABLET | Freq: Every day | ORAL | Status: DC
  Administered 2019-02-13: 40 mg via ORAL
  Filled 2019-02-12: qty 1

## 2019-02-12 MED ORDER — HYDRALAZINE HCL 20 MG/ML IJ SOLN: 5.0000 mg | INTRAMUSCULAR | Status: DC | PRN

## 2019-02-12 MED ORDER — SODIUM CHLORIDE 0.9% IV SOLUTION: Freq: Once | INTRAVENOUS | Status: DC

## 2019-02-12 MED ORDER — ONDANSETRON HCL 4 MG PO TABS: 4.0000 mg | ORAL_TABLET | Freq: Four times a day (QID) | ORAL | Status: DC | PRN

## 2019-02-12 MED ORDER — POTASSIUM CHLORIDE CRYS ER 20 MEQ PO TBCR: 40.0000 meq | EXTENDED_RELEASE_TABLET | Freq: Once | ORAL | Status: DC

## 2019-02-12 MED ORDER — SODIUM CHLORIDE 0.9 % IV BOLUS
500.0000 mL | Freq: Once | INTRAVENOUS | Status: AC
  Administered 2019-02-12: 500 mL via INTRAVENOUS

## 2019-02-12 MED ORDER — ONDANSETRON HCL 4 MG/2ML IJ SOLN: 4.0000 mg | Freq: Four times a day (QID) | INTRAMUSCULAR | Status: DC | PRN

## 2019-02-12 MED ORDER — ACETAMINOPHEN 325 MG PO TABS: 650.0000 mg | ORAL_TABLET | Freq: Four times a day (QID) | ORAL | Status: DC | PRN

## 2019-02-12 MED ORDER — AMLODIPINE BESYLATE 5 MG PO TABS
5.0000 mg | ORAL_TABLET | Freq: Every day | ORAL | Status: DC
  Administered 2019-02-12 – 2019-02-13 (×2): 5 mg via ORAL
  Filled 2019-02-12 (×2): qty 1

## 2019-02-12 MED ORDER — ACETAMINOPHEN 650 MG RE SUPP: 650.0000 mg | Freq: Four times a day (QID) | RECTAL | Status: DC | PRN

## 2019-02-12 MED ORDER — POTASSIUM CHLORIDE CRYS ER 20 MEQ PO TBCR
20.0000 meq | EXTENDED_RELEASE_TABLET | Freq: Once | ORAL | Status: AC
  Administered 2019-02-12: 20 meq via ORAL
  Filled 2019-02-12: qty 1

## 2019-02-12 MED ORDER — PANTOPRAZOLE SODIUM 40 MG PO TBEC
40.0000 mg | DELAYED_RELEASE_TABLET | Freq: Two times a day (BID) | ORAL | Status: DC
  Administered 2019-02-12 (×2): 40 mg via ORAL
  Filled 2019-02-12 (×2): qty 1

## 2019-02-12 NOTE — Consult Note (Addendum)
[facility] Consult: 10:27 AM 02/12/2019  LOS: 0 days    Referring Provider: Dr Lonny Prude  Primary Care Physician:  H[facility]d Pollen, [facility] at [facility]. Primary Gastroenterologist:  Dr. Paulita Fujita versus Dr. Ardis Hughs.  unassigned    Reason for Consultation: Painless hematochezia.   HPI: Latoya Bennett is a 83 y.o. female.  Hx htn.  Gout.  GERD.    Episodes painless hematochezia in late 1970s, 1997 2007, 04/2018.  Attributed to diverticular source.   09/2005 colonoscopy.  For hematochezia.  Dr. Ardis Hughs.  Colon diverticulosis.  Presumed to have had diverticular source for bleeding. 04/2018 EGD.  Dr Paulita Fujita.  For hematochezia, tagged RBC scan 07/18/2017 showing bleeding activity in distal stomach/proximal duodenum.  Hgb 8.9, transfused 3 PRBC.  Found: small HH, mild pre-pyloric gastritis.  O/W normal endoscopy to second portion of duodenum.  On further review of tagged RBC study, IR felt that the bleed may have been in the proximal colon rather than small bowel.  She has been having arthritis troubles in her left ankle and more recently left shoulder.  Last week she was started back on meloxicam once daily.  She did not feel like this improved the pain significantly. Patient began painless hematochezia on 8/5.  One episode that day.  That night she slept with ice packs on her rectum which was recommended by her sister.  She did not have any bleeding the next day.  However yesterday, Friday she had recurrent hematochezia fairly large volume.  Again with no pain, no nausea.  She felt dizzy with standing, weakness.  No syncope.  Came to the emergency room.  She just had the 1 stool at home yesterday and one small bloody stool this morning.  She feels well.  Hgb 8.7 >> 1 PRBC >> 8.5.  Was 10.8 on 05/07/2018. Platelets 171  >> 142.  INR 1.2. BUN normal.  Potassium low at 3.2.  Began on empiric Protonix 40 mg twice daily.  Patient lives at home, independent for ADLs at baseline.     Past Medical History:  Diagnosis Date  . Arthritis   . Diverticulitis   . GERD (gastroesophageal reflux disease)   . GI bleed   . Hypertension     Past Surgical History:  Procedure Laterality Date  . ABDOMINAL HYSTERECTOMY    . ESOPHAGOGASTRODUODENOSCOPY (EGD) WITH PROPOFOL Left 04/18/2018   Procedure: ESOPHAGOGASTRODUODENOSCOPY (EGD) WITH PROPOFOL;  Surgeon: Arta Silence, MD;  Location: [facility];  Service: Endoscopy;  Laterality: Left;    Prior to Admission medications   Medication Sig Start Date End Date Taking? Authorizing Provider  amLODipine (NORVASC) 10 MG tablet TAKE 1 TABLET BY MOUTH DAILY Patient taking differently: Take 10 mg by mouth daily.  02/06/19  Yes Sagardia, Ines Bloomer, MD  meloxicam (MOBIC) 7.5 MG tablet Take 1 tablet (7.5 mg total) by mouth daily. 01/13/19  Yes Rutherford Guys, MD  Potassium 99 MG TABS Take 99 mg by mouth daily.   Yes [provider]  colchicine 0.6 MG tablet Take 1 tablet (0.6 mg total) by mouth  2 (two) times daily. Stop once gout flare improved Patient not taking: Reported on 02/12/2019 05/07/18   Forrest Moron, MD  diclofenac sodium (VOLTAREN) 1 % GEL Apply 2 g topically 4 (four) times daily. Patient not taking: Reported on 02/12/2019 04/20/18   Shelly Coss, MD    Scheduled Meds: . sodium chloride   Intravenous Once  . amLODipine  5 mg Oral Daily  . pantoprazole  40 mg Oral BID   Infusions:  PRN Meds: acetaminophen **OR** acetaminophen, hydrALAZINE, ondansetron **OR** ondansetron (ZOFRAN) IV   Allergies as of 02/11/2019  . (No Known Allergies)    Family History  Problem Relation Age of Onset  . Hypertension Sister     Social History   Socioeconomic History  . Marital status: Single    Spouse name: Not on file  . Number of children: Not on  file  . Years of education: Not on file  . Highest education level: Not on file  Occupational History  . Occupation: retired  Scientific laboratory technician  . Financial resource strain: Not on file  . Food insecurity    Worry: Not on file    Inability: Not on file  . Transportation needs    Medical: Not on file    Non-medical: Not on file  Tobacco Use  . Smoking status: Never Smoker  . Smokeless tobacco: Never Used  Substance and Sexual Activity  . Alcohol use: Not on file  . Drug use: Never  . Sexual activity: Not on file  Lifestyle  . Physical activity    Days per week: Not on file    Minutes per session: Not on file  . Stress: Not on file  Relationships  . Social Herbalist on phone: Not on file    Gets together: Not on file    Attends religious service: Not on file    Active member of club or organization: Not on file    Attends meetings of clubs or organizations: Not on file    Relationship status: Not on file  . Intimate partner violence    Fear of current or ex partner: Not on file    Emotionally abused: Not on file    Physically abused: Not on file    Forced sexual activity: Not on file  Other Topics Concern  . Not on file  Social History Narrative   Marital status: single; not dating in 2018; moved from [place] to be near sister; not happy in [place].  Lived [place] for 50 years.      Children: no children      Lives: alone with cat      Employment: retired; Optometrist with computers      Tobacco:       Alcohol:       ADLs; independent with ADLs; drives    REVIEW OF SYSTEMS: Constitutional: Per HPI.  Normally has good energy and does not experience fatigue. ENT:  No nose bleeds Pulm: No shortness of breath.  No cough. CV:  No palpitations, no LE edema.  No chest pain.   GU:  No hematuria, no frequency GI: No dysphagia.  No irregular bowel habits. Heme: Other than the GI bleeding, no unusual bleeding or bruising. Transfusions: See HPI. Neuro:  No  headaches, no peripheral tingling or numbness.  CNS but no syncope or presyncope. Derm:  No itching, no rash or sores.  Endocrine:  No sweats or chills.  No polyuria or dysuria Immunization: Reviewed. Travel:  None beyond local counties in last few months.    PHYSICAL EXAM: Vital signs in last 24 hours: Vitals:   02/12/19 0504 02/12/19 0849  BP: (!) 99/38 133/65  Pulse: 79   Resp: 16   Temp: 98 F (36.7 C)   SpO2: 100%    Wt Readings from Last 3 Encounters:  02/11/19 56.2 kg  01/13/19 56.6 kg  05/19/18 58.4 kg    General: Pleasant, well-appearing, thin but not malnourished or cachectic appearing AAF. Head: No facial asymmetry or swelling.  No signs of head trauma. Eyes: No conjunctival pallor.  No scleral icterus.  EOMI. Ears: No hearing deficit Nose: No discharge or congestion Mouth: Moist, pink, clear oropharynx.  Tongue midline. Neck: No JVD, no masses, no thyromegaly. Lungs: Good breath sounds, clear bilaterally.  No cough or labored breathing. Heart: RRR.  No MRG.  S1, S2 present. Abdomen: Soft.  Not tender or distended.  No HSM, masses, bruits, hernias..   Rectal: Deferred. Musc/Skeltl: Arthritic deformity in the knees, ankles, hands.  Some swelling in the left ankle. Extremities: No edema. Neurologic: Fully alert and oriented x3.  Good historian.  Moves all 4 limbs, strength not tested.  No tremors. Skin: No rash, no sores, no suspicious lesions. Tattoos: None. Nodes: Cervical adenopathy. Psych: Some, cooperative, calm.  Intake/Output from previous day: 08/07 0701 - 08/08 0700 In: 1130 [Blood:630; IV Piggyback:500] Out: -  Intake/Output this shift: No intake/output data recorded.  LAB RESULTS: Recent Labs    02/11/19 1545 02/12/19 0700  WBC 6.7 6.9  HGB 8.7* 8.5*  HCT 24.9* 24.8*  PLT 171 142*   BMET Lab Results  Component Value Date   NA 140 02/12/2019   NA 138 02/11/2019   NA 141 05/07/2018   K 3.2 (L) 02/12/2019   K 3.4 (L) 02/11/2019   K  3.0 (L) 05/07/2018   CL 107 02/12/2019   CL 106 02/11/2019   CL 99 05/07/2018   CO2 27 02/12/2019   CO2 25 02/11/2019   CO2 25 05/07/2018   GLUCOSE 108 (H) 02/12/2019   GLUCOSE 175 (H) 02/11/2019   GLUCOSE 127 (H) 05/07/2018   BUN 16 02/12/2019   BUN 17 02/11/2019   BUN 13 05/07/2018   CREATININE 0.86 02/12/2019   CREATININE 0.90 02/11/2019   CREATININE 0.80 05/07/2018   CALCIUM 9.3 02/12/2019   CALCIUM 9.5 02/11/2019   CALCIUM 10.4 (H) 05/07/2018   LFT Recent Labs    02/11/19 1545  PROT 5.5*  ALBUMIN 3.3*  AST 17  ALT 15  ALKPHOS 65  BILITOT 0.6   PT/INR Lab Results  Component Value Date   INR 1.2 02/12/2019   INR 1.15 04/18/2018   INR 1.08 06/20/2009    RADIOLOGY STUDIES: No results found.    IMPRESSION:   *    Painless hematochezia.  Presume recurrent diverticular bleed.  This appears to be stuttering.  It started on Wednesday, did not occur on Thursday and then restarted on Friday.  small volume episode this AM.  *    Blood loss anemia.  Hbg stable, no real change after 1 PRBC As she is microcytic.  Might warrant repeat colonoscopy, possible upper endoscopy.  *     Hypokalemia.  Patient previously used prescribed potassium.  She has been using OTC potassium since last week.  She may warrant restart of prescription potassium  PLAN:     *    Allow solid food.  Convert to clear liquids if Dr. decides to pursue  colonoscopy tomorrow.  *    Anemia profile this PM with her next lab draw.  CBC this evening and tomorrow morning.  *   Not bleeding enough now to warrant nuc RBC scan or CTA   Azucena Freed  02/12/2019, 10:27 AM Phone [phone]

## 2019-02-12 NOTE — Evaluation (Signed)
Physical Therapy Evaluation Patient Details Name: Latoya Bennett MRN: [id number] DOB: 1935/12/21 Today's Date: 02/12/2019   History of Present Illness  Patient is an 83 y/o fmealie with PMH of diverticulitis, GI bleed, HTN, and arthritis/gout with recent flare who presents to the ED with a chief compliant of rectal bleeding.  Clinical Impression  Patient presents with decreased balance with mobility due to pre-existing R knee pain and reports L ankle much improved.  Reports wears brace on her knee at home.  PT will follow in acute setting for maxmizing mobility for safe d/c home alone and likely no follow up PT needs.      Follow Up Recommendations No PT follow up    Equipment Recommendations  None recommended by PT    Recommendations for Other Services       Precautions / Restrictions Precautions Precautions: Fall Precaution Comments: reports fall day PTA feeling dizzy when going to heat up some soup      Mobility  Bed Mobility Overal bed mobility: Modified Independent                Transfers Overall transfer level: Modified independent Equipment used: Straight cane             General transfer comment: slow to rise appropriately to make sure no dizziness  Ambulation/Gait Ambulation/Gait assistance: Supervision Gait Distance (Feet): 120 Feet Assistive device: Straight cane Gait Pattern/deviations: Step-through pattern;Decreased stride length;Decreased stance time - right     General Gait Details: stiff legged gait on R, reports has velcro brace she wears at home  Stairs            Wheelchair Mobility    Modified Rankin (Stroke Patients Only)       Balance Overall balance assessment: Needs assistance   Sitting balance-Leahy Scale: Good       Standing balance-Leahy Scale: Fair                               Pertinent Vitals/Pain Pain Assessment: 0-10 Pain Score: 9  Pain Location: R knee with ambulation Pain  Descriptors / Indicators: Aching Pain Intervention(s): Monitored during session;Limited activity within patient's tolerance    Home Living Family/patient expects to be discharged to:: Private residence Living Arrangements: Alone Available Help at Discharge: Family;Personal care attendant(sister lives close) Type of Home: House Home Access: Level entry     Home Layout: One level Home Equipment: Cane - single point;Walker - 2 wheels;Tub bench;Wheelchair - manual Additional Comments: supposed to have 2 hours help on Wed and Fri, but she says they dont come like supposed to    Prior Function Level of Independence: Independent with assistive device(s)         Comments: used cane prior to admission     Hand Dominance        Extremity/Trunk Assessment   Upper Extremity Assessment Upper Extremity Assessment: Overall WFL for tasks assessed    Lower Extremity Assessment Lower Extremity Assessment: RLE deficits/detail;LLE deficits/detail RLE Deficits / Details: limited flexion noted in function, but moves well antigravity LLE Deficits / Details: reports recent L ankle swelling/gout flare, but much improved today per her report       Communication      Cognition Arousal/Alertness: Awake/alert Behavior During Therapy: WFL for tasks assessed/performed Overall Cognitive Status: Within Functional Limits for tasks assessed  General Comments      Exercises     Assessment/Plan    PT Assessment    PT Problem List         PT Treatment Interventions      PT Goals (Current goals can be found in the Care Plan section)  Acute Rehab PT Goals Patient Stated Goal: to return home PT Goal Formulation: With patient Time For Goal Achievement: 02/19/19 Potential to Achieve Goals: Good    Frequency     Barriers to discharge        Co-evaluation               AM-PAC PT "6 Clicks" Mobility  Outcome Measure Help  needed turning from your back to your side while in a flat bed without using bedrails?: None Help needed moving from lying on your back to sitting on the side of a flat bed without using bedrails?: None Help needed moving to and from a bed to a chair (including a wheelchair)?: None Help needed standing up from a chair using your arms (e.g., wheelchair or bedside chair)?: None Help needed to walk in hospital room?: A Little Help needed climbing 3-5 steps with a railing? : A Little 6 Click Score: 22    End of Session Equipment Utilized During Treatment: Gait belt Activity Tolerance: Patient tolerated treatment well Patient left: in bed;with call bell/phone within reach   PT Visit Diagnosis: Other abnormalities of gait and mobility (R26.89)    Time: 9201-0071 PT Time Calculation (min) (ACUTE ONLY): 26 min   Charges:   PT Evaluation $PT Eval Moderate Complexity: 1 Mod PT Treatments $Gait Training: 8-22 mins        Magda Kiel, [place] [phone] 02/12/2019   Reginia Naas 02/12/2019, 2:08 PM

## 2019-02-12 NOTE — H&P (Addendum)
History and Physical    Latoya Bennett VVO:[id number] DOB: 1935/12/08 DOA: 02/11/2019  Referring MD/NP/PA:   PCP: Horald Pollen, MD   Patient coming from:  The patient is coming from home.  At baseline, pt is independent for most of ADL.        Chief Complaint: bloody stool  HPI: Latoya Bennett is a 83 y.o. female with medical history significant of diverticulosis, GI bleeding, hypertension, GERD, gout, PAC, who presents with bloody stool.  Patient states that he has had 2 episodes of bloody stool, one episode yesterday and another episode today.  Patient denies nausea vomiting, abdominal pain.  She states that she feels dizzy on standing, and lowered herself to the ground, no fall, no loss of consciousness or injury.  Patient denies shortness of breath, chest pain, cough, fever or chills.  No symptoms of UTI or unilateral weakness. Pt is taking Mobic.  # Colonoscopy by Dr. Ardis Hughs of [facility] on 02/21/2016 showed diverticulosis # EGD by Dr. Paulita Fujita on 04/18/2018 showed gastritis and small hiatal hernia.  ED Course: pt was found to have hemoglobin dropped from a 10.8 on 05/07/18-8.7, pending FOBT, pending COVID-19 test, potassium 3.4, renal function normal, temperature normal, blood pressure 127/51, no tachycardia, oxygen saturation 93 to 99% on room air.  Patient is placed on telemetry bed for observation.  Review of Systems:   General: no fevers, chills, no body weight gain, has poor appetite, has fatigue HEENT: no blurry vision, hearing changes or sore throat Respiratory: no dyspnea, coughing, wheezing CV: no chest pain, no palpitations GI: no nausea, vomiting, abdominal pain, diarrhea, constipation GU: no dysuria, burning on urination, increased urinary frequency, hematuria. Has bloody stool. Ext: no leg edema Neuro: no unilateral weakness, numbness, or tingling, no vision change or hearing loss.has dizziness. Skin: no rash, no skin tear. MSK: No muscle spasm, no  deformity, no limitation of range of movement in spin Heme: No easy bruising.  Travel history: No recent long distant travel.  Allergy: No Known Allergies  Past Medical History:  Diagnosis Date  . Arthritis   . Diverticulitis   . GERD (gastroesophageal reflux disease)   . GI bleed   . Hypertension     Past Surgical History:  Procedure Laterality Date  . ABDOMINAL HYSTERECTOMY    . ESOPHAGOGASTRODUODENOSCOPY (EGD) WITH PROPOFOL Left 04/18/2018   Procedure: ESOPHAGOGASTRODUODENOSCOPY (EGD) WITH PROPOFOL;  Surgeon: Arta Silence, MD;  Location: [facility];  Service: Endoscopy;  Laterality: Left;    Social History:  reports that she has never smoked. She has never used smokeless tobacco. She reports that she does not use drugs. No history on file for alcohol.  Family History:  Family History  Problem Relation Age of Onset  . Hypertension Sister      Prior to Admission medications   Medication Sig Start Date End Date Taking? Authorizing Provider  amLODipine (NORVASC) 10 MG tablet TAKE 1 TABLET BY MOUTH DAILY Patient taking differently: Take 10 mg by mouth daily.  02/06/19  Yes Sagardia, Ines Bloomer, MD  meloxicam (MOBIC) 7.5 MG tablet Take 1 tablet (7.5 mg total) by mouth daily. 01/13/19  Yes Rutherford Guys, MD  Potassium 99 MG TABS Take 99 mg by mouth daily.   Yes [provider]  colchicine 0.6 MG tablet Take 1 tablet (0.6 mg total) by mouth 2 (two) times daily. Stop once gout flare improved Patient not taking: Reported on 02/12/2019 05/07/18   Forrest Moron, MD  diclofenac sodium (VOLTAREN) 1 %  GEL Apply 2 g topically 4 (four) times daily. Patient not taking: Reported on 02/12/2019 04/20/18   Shelly Coss, MD    Physical Exam: Vitals:   02/12/19 0230 02/12/19 0235 02/12/19 0245 02/12/19 0248  BP: (!) 114/55 (!) 114/55 119/65 119/65  Pulse:  90  85  Resp: 16 18 17  (!) 21  Temp:  97.9 F (36.6 C)  97.8 F (36.6 C)  TempSrc:  Oral  Oral  SpO2:      Weight:       Height:       General: Not in acute distress. Pale looking. HEENT:       Eyes: PERRL, EOMI, no scleral icterus.       ENT: No discharge from the ears and nose, no pharynx injection, no tonsillar enlargement.        Neck: No JVD, no bruit, no mass felt. Heme: No neck lymph node enlargement. Cardiac: S1/S2, RRR, No murmurs, No gallops or rubs. Respiratory: No rales, wheezing, rhonchi or rubs. GI: Soft, nondistended, nontender, no rebound pain, no organomegaly, BS present. GU: No hematuria Ext: No pitting leg edema bilaterally. 2+DP/PT pulse bilaterally. Musculoskeletal: No joint deformities, No joint redness or warmth, no limitation of ROM in spin. Skin: No rashes.  Neuro: Alert, oriented X3, cranial nerves II-XII grossly intact, moves all extremities normally. Psych: Patient is not psychotic, no suicidal or hemocidal ideation.  Labs on Admission: I have personally reviewed following labs and imaging studies  CBC: Recent Labs  Lab 02/11/19 1545  WBC 6.7  HGB 8.7*  HCT 24.9*  MCV 73.5*  PLT 664   Basic Metabolic Panel: Recent Labs  Lab 02/11/19 1545  NA 138  K 3.4*  CL 106  CO2 25  GLUCOSE 175*  BUN 17  CREATININE 0.90  CALCIUM 9.5   GFR: Estimated Creatinine Clearance: 42.3 mL/min (by C-G formula based on SCr of 0.9 mg/dL). Liver Function Tests: Recent Labs  Lab 02/11/19 1545  AST 17  ALT 15  ALKPHOS 65  BILITOT 0.6  PROT 5.5*  ALBUMIN 3.3*   No results for input(s): LIPASE, AMYLASE in the last 168 hours. No results for input(s): AMMONIA in the last 168 hours. Coagulation Profile: No results for input(s): INR, PROTIME in the last 168 hours. Cardiac Enzymes: No results for input(s): CKTOTAL, CKMB, CKMBINDEX, TROPONINI in the last 168 hours. BNP (last 3 results) No results for input(s): PROBNP in the last 8760 hours. HbA1C: No results for input(s): HGBA1C in the last 72 hours. CBG: No results for input(s): GLUCAP in the last 168 hours. Lipid  Profile: No results for input(s): CHOL, HDL, LDLCALC, TRIG, CHOLHDL, LDLDIRECT in the last 72 hours. Thyroid Function Tests: No results for input(s): TSH, T4TOTAL, FREET4, T3FREE, THYROIDAB in the last 72 hours. Anemia Panel: No results for input(s): VITAMINB12, FOLATE, FERRITIN, TIBC, IRON, RETICCTPCT in the last 72 hours. Urine analysis:    Component Value Date/Time   COLORURINE STRAW (A) 04/14/2018 1521   APPEARANCEUR CLEAR 04/14/2018 1521   LABSPEC 1.015 04/14/2018 1521   PHURINE 7.0 04/14/2018 1521   GLUCOSEU NEGATIVE 04/14/2018 1521   HGBUR LARGE (A) 04/14/2018 1521   BILIRUBINUR NEGATIVE 04/14/2018 1521   BILIRUBINUR negative 11/08/2015 1258   KETONESUR NEGATIVE 04/14/2018 1521   PROTEINUR NEGATIVE 04/14/2018 1521   UROBILINOGEN 0.2 11/08/2015 1258   UROBILINOGEN 1.0 06/20/2009 1449   NITRITE NEGATIVE 04/14/2018 1521   LEUKOCYTESUR NEGATIVE 04/14/2018 1521   Sepsis Labs: @LABRCNTIP (procalcitonin:4,lacticidven:4) ) Recent Results (from the past 240 hour(s))  SARS Coronavirus 2 [facility] order, Performed in [facility] hospital lab) Nasopharyngeal Nasopharyngeal Swab     Status: None   Collection Time: 02/12/19 12:54 AM   Specimen: Nasopharyngeal Swab  Result Value Ref Range Status   SARS Coronavirus 2 NEGATIVE NEGATIVE Final    Comment: (NOTE) If result is NEGATIVE SARS-CoV-2 target nucleic acids are NOT DETECTED. The SARS-CoV-2 RNA is generally detectable in upper and lower  respiratory specimens during the acute phase of infection. The lowest  concentration of SARS-CoV-2 viral copies this assay can detect is 250  copies / mL. A negative result does not preclude SARS-CoV-2 infection  and should not be used as the sole basis for treatment or other  patient management decisions.  A negative result may occur with  improper specimen collection / handling, submission of specimen other  than nasopharyngeal swab, presence of viral mutation(s) within the  areas targeted by  this assay, and inadequate number of viral copies  (<250 copies / mL). A negative result must be combined with clinical  observations, patient history, and epidemiological information. If result is POSITIVE SARS-CoV-2 target nucleic acids are DETECTED. The SARS-CoV-2 RNA is generally detectable in upper and lower  respiratory specimens dur ing the acute phase of infection.  Positive  results are indicative of active infection with SARS-CoV-2.  Clinical  correlation with patient history and other diagnostic information is  necessary to determine patient infection status.  Positive results do  not rule out bacterial infection or co-infection with other viruses. If result is PRESUMPTIVE POSTIVE SARS-CoV-2 nucleic acids MAY BE PRESENT.   A presumptive positive result was obtained on the submitted specimen  and confirmed on repeat testing.  While 2019 novel coronavirus  (SARS-CoV-2) nucleic acids may be present in the submitted sample  additional confirmatory testing may be necessary for epidemiological  and / or clinical management purposes  to differentiate between  SARS-CoV-2 and other Sarbecovirus currently known to infect humans.  If clinically indicated additional testing with an alternate test  methodology [phone]) is advised. The SARS-CoV-2 RNA is generally  detectable in upper and lower respiratory sp ecimens during the acute  phase of infection. The expected result is Negative. Fact Sheet for Patients:  StrictlyIdeas.no Fact Sheet for Healthcare Providers: BankingDealers.co.za This test is not yet approved or cleared by the [place] FDA and has been authorized for detection and/or diagnosis of SARS-CoV-2 by FDA under an Emergency Use Authorization (EUA).  This EUA will remain in effect (meaning this test can be used) for the duration of the COVID-19 declaration under Section 564(b)(1) of the Act, 21 U.S.C. section  360bbb-3(b)(1), unless the authorization is terminated or revoked sooner. Performed at [facility], [place] [address] [place], [place] [address]      Radiological Exams on Admission: No results found.   EKG: Independently reviewed.  Sinus rhythm, QTC 471, low voltage, occasional PAC, nonspecific T wave change.  Assessment/Plan Principal Problem:   GI bleed Active Problems:   PAC (premature atrial contraction)   Essential hypertension   Hypokalemia   GERD (gastroesophageal reflux disease)   Symptomatic anemia   GIB (gastrointestinal bleeding)   GI bleed and symptomatic anemia: pt likely has lower GIB due to diverticular bleeding.  Hemoglobin dropped from 10.8-8.7, but patient is symptomatic, feeling dizzy.  Currently hemodynamically stable.  - will place in tele bed for obs - hold Mobic - transfuse 1 unit of blood now - NPO  - IVF: 500 cc NS bolus, then at  75 mL/hr - Zofran IV for nausea - Avoid NSAIDs and SQ heparin - Maintain IV access (2 large bore IVs if possible). - Monitor closely and follow q6h cbc, transfuse as necessary, if Hgb<7.0 - LaB: INR, PTT and type screen - please call GI in AM  Essential hypertension: Bp 127/51 -decrease amlodipine dose from 10 to 5 mg daily -IV hydralazine as needed  Hypokalemia: K= 3.4 on admission. - Repleted - Check Mg level  GERD (gastroesophageal reflux disease): -start protonix   PAC: HR 56-79. -tele monitoring   DVT ppx: SCD Code Status: Full code Family Communication: None at bed side.   Disposition Plan:  Anticipate discharge back to previous home environment Consults called:  none Admission status: Obs / tele     Date of Service 02/12/2019    [facility]   If 7PM-7AM, please contact night-coverage [email or web address] Password [email or web address] 02/12/2019, 2:58 AM

## 2019-02-12 NOTE — Progress Notes (Signed)
Patient arrived to the unit via bed from the emergency department. Patient is alert and oriented .  No complaints of pain. Skin assessment complete.  No skin issues. Placed the patient on telemetry. Educated the patient on how to reach the staff on the unit. Lowered the bed, placed the call light within reach and activated the bed alarm.  Will continue to monitor the patient and notify as needed

## 2019-02-12 NOTE — ED Notes (Signed)
ED TO INPATIENT HANDOFF REPORT  ED Nurse Name and Phone #:  Patty [phone]  S Name/Age/Gender [facility] 83 y.o. female Room/Bed: 017C/017C  Code Status   Code Status: Prior  Home/SNF/Other Home Patient oriented to: self, place, time and situation Is this baseline? Yes   Triage Complete: Triage complete  Chief Complaint rectal bleeding  Triage Note Pt states 2 episodes of bloody stool - one today and one yesterday.  States hx of diverticulosis.  Pt states dizziness when she stands.   Allergies No Known Allergies  Level of Care/Admitting Diagnosis ED Disposition    ED Disposition Condition [facility] Area: [place] [[id number]]  Level of Care: Telemetry Medical [104]  I expect the patient will be discharged within 24 hours: No (not a candidate for 5C-Observation unit)  Covid Evaluation: Asymptomatic Screening Protocol (No Symptoms)  Diagnosis: GIB (gastrointestinal bleeding) [812751]  Admitting Physician: Ivor Costa [[id number]]  Attending Physician: Ivor Costa [[id number]]  PT Class (Do Not Modify): Observation [104]  PT Acc Code (Do Not Modify): Observation [10022]       B Medical/Surgery History Past Medical History:  Diagnosis Date  . Arthritis   . Diverticulitis   . GERD (gastroesophageal reflux disease)   . GI bleed   . Hypertension    Past Surgical History:  Procedure Laterality Date  . ABDOMINAL HYSTERECTOMY    . ESOPHAGOGASTRODUODENOSCOPY (EGD) WITH PROPOFOL Left 04/18/2018   Procedure: ESOPHAGOGASTRODUODENOSCOPY (EGD) WITH PROPOFOL;  Surgeon: Arta Silence, MD;  Location: [facility];  Service: Endoscopy;  Laterality: Left;     A IV Location/Drains/Wounds Patient Lines/Drains/Airways Status   Active Line/Drains/Airways    None          Intake/Output Last 24 hours No intake or output data in the 24 hours ending 02/12/19 0051  Labs/Imaging Results for orders placed or performed during the hospital  encounter of 02/11/19 (from the past 48 hour(s))  Comprehensive metabolic panel     Status: Abnormal   Collection Time: 02/11/19  3:45 PM  Result Value Ref Range   Sodium 138 135 - 145 mmol/L   Potassium 3.4 (L) 3.5 - 5.1 mmol/L   Chloride 106 98 - 111 mmol/L   CO2 25 22 - 32 mmol/L   Glucose, Bld 175 (H) 70 - 99 mg/dL   BUN 17 8 - 23 mg/dL   Creatinine, Ser 0.90 0.44 - 1.00 mg/dL   Calcium 9.5 8.9 - 10.3 mg/dL   Total Protein 5.5 (L) 6.5 - 8.1 g/dL   Albumin 3.3 (L) 3.5 - 5.0 g/dL   AST 17 15 - 41 U/L   ALT 15 0 - 44 U/L   Alkaline Phosphatase 65 38 - 126 U/L   Total Bilirubin 0.6 0.3 - 1.2 mg/dL   GFR calc non Af Amer 60 (L) >60 mL/min   GFR calc Af Amer >60 >60 mL/min   Anion gap 7 5 - 15    Comment: Performed at [facility], [address], [place], [place] [address]  CBC     Status: Abnormal   Collection Time: 02/11/19  3:45 PM  Result Value Ref Range   WBC 6.7 4.0 - 10.5 K/uL   RBC 3.39 (L) 3.87 - 5.11 MIL/uL   Hemoglobin 8.7 (L) 12.0 - 15.0 g/dL    Comment: Reticulocyte Hemoglobin testing may be clinically indicated, consider ordering this additional test [id number]    HCT 24.9 (L) 36.0 - 46.0 %   MCV 73.5 (  L) 80.0 - 100.0 fL   MCH 25.7 (L) 26.0 - 34.0 pg   MCHC 34.9 30.0 - 36.0 g/dL   RDW 14.1 11.5 - 15.5 %   Platelets 171 150 - 400 K/uL   nRBC 0.0 0.0 - 0.2 %    Comment: Performed at [facility], [place] [address] [place], [place] [address]  Type and screen [place]     Status: None   Collection Time: 02/11/19  4:08 PM  Result Value Ref Range   ABO/RH(D) O POS    Antibody Screen NEG    Sample Expiration      02/14/2019,2359 Performed at [facility], [place] [address], [place], [place] [address]   Prepare RBC     Status: None   Collection Time: 02/12/19 12:47 AM  Result Value Ref Range   Order Confirmation      ORDER PROCESSED BY BLOOD BANK Performed at [facility], [place] [address], [place], [place] [address]     No results found.  Pending Labs [employer] (From admission, onward)    Start     Ordered   02/12/19 0038  SARS Coronavirus 2 [facility] order, Performed in [facility] hospital lab) Nasopharyngeal Nasopharyngeal Swab  (Symptomatic/High Risk of Exposure/Tier 1 Patients Labs with Precautions)  Once,   STAT    Question Answer Comment  Is this test for diagnosis or screening Diagnosis of ill patient   Symptomatic for COVID-19 as defined by CDC No   Hospitalized for COVID-19 No   Admitted to ICU for COVID-19 No   Previously tested for COVID-19 No   Resident in a congregate (group) care setting No   Employed in healthcare setting No   Pregnant No      02/12/19 0038   Signed and Held  CBC  Now then every 6 hours,   R     Signed and Held   Signed and Held  Protime-INR  Once,   R     Signed and Held   Signed and Held  APTT  Once,   R     Signed and Held   Signed and Held  Magnesium  Tomorrow morning,   R     Signed and Held   Signed and Held  Basic metabolic panel  Tomorrow morning,   R     Signed and Held          Vitals/Pain Today's Vitals   02/11/19 2154 02/11/19 2312 02/11/19 2315 02/12/19 0014  BP: (!) 112/95  (!) 127/51   Pulse: 79     Resp: 18     Temp:      TempSrc:      SpO2: 93%     Weight:      Height:      PainSc:  0-No pain  0-No pain    Isolation Precautions Airborne and Contact precautions  Medications Medications  sodium chloride 0.9 % bolus 500 mL (has no administration in time range)  0.9 %  sodium chloride infusion (has no administration in time range)  0.9 %  sodium chloride infusion (Manually program via Guardrails IV Fluids) (has no administration in time range)    Mobility walks with device     Focused Assessments    R Recommendations: See Admitting Provider Note  Report given to:   Additional Notes:

## 2019-02-12 NOTE — Progress Notes (Signed)
Patient seen and examined at bedside, patient admitted after midnight, please see earlier detailed admission note by Ivor Costa, MD. Briefly, patient presented with GI bleeding concerning for diverticular bleed. Associated symptomatic anemia, s/p 1 unit of PRBC. GI consulted. Serial CBC.   Cordelia Poche, MD [facility] 02/12/2019, 3:24 PM

## 2019-02-12 NOTE — Plan of Care (Signed)
  Problem: Education: Goal: Knowledge of General Education information will improve Description Including pain rating scale, medication(s)/side effects and non-pharmacologic comfort measures Outcome: Progressing   

## 2019-02-13 LAB — CBC
HCT: 22.1 % — ABNORMAL LOW (ref 36.0–46.0)
HCT: 18.1 % — ABNORMAL LOW (ref 36.0–46.0)
Hemoglobin: 7.6 g/dL — ABNORMAL LOW (ref 12.0–15.0)
Hemoglobin: 6.2 g/dL — CL (ref 12.0–15.0)
MCH: 26 pg (ref 26.0–34.0)
MCH: 26.4 pg (ref 26.0–34.0)
MCHC: 34.4 g/dL (ref 30.0–36.0)
MCHC: 34.3 g/dL (ref 30.0–36.0)
MCV: 75.7 fL — ABNORMAL LOW (ref 80.0–100.0)
MCV: 77 fL — ABNORMAL LOW (ref 80.0–100.0)
Platelets: 134 10*3/uL — ABNORMAL LOW (ref 150–400)
Platelets: 130 10*3/uL — ABNORMAL LOW (ref 150–400)
RBC: 2.92 MIL/uL — ABNORMAL LOW (ref 3.87–5.11)
RBC: 2.35 MIL/uL — ABNORMAL LOW (ref 3.87–5.11)
RDW: 14.9 % (ref 11.5–15.5)
RDW: 15.4 % (ref 11.5–15.5)
WBC: 4.4 10*3/uL (ref 4.0–10.5)
WBC: 6.8 10*3/uL (ref 4.0–10.5)
nRBC: 0 % (ref 0.0–0.2)
nRBC: 0 % (ref 0.0–0.2)

## 2019-02-13 LAB — BASIC METABOLIC PANEL
Anion gap: 7 (ref 5–15)
BUN: 13 mg/dL (ref 8–23)
CO2: 25 mmol/L (ref 22–32)
Calcium: 8.7 mg/dL — ABNORMAL LOW (ref 8.9–10.3)
Chloride: 106 mmol/L (ref 98–111)
Creatinine, Ser: 0.94 mg/dL (ref 0.44–1.00)
GFR calc Af Amer: >60 mL/min (ref >60)
GFR calc non Af Amer: 57 mL/min — ABNORMAL LOW (ref >60)
Glucose, Bld: 167 mg/dL — ABNORMAL HIGH (ref 70–99)
Potassium: 3.1 mmol/L — ABNORMAL LOW (ref 3.5–5.1)
Sodium: 138 mmol/L (ref 135–145)

## 2019-02-13 LAB — HEMOGLOBIN AND HEMATOCRIT, BLOOD
HCT: 25 % — ABNORMAL LOW (ref 36.0–46.0)
HCT: 21.9 % — ABNORMAL LOW (ref 36.0–46.0)
Hemoglobin: 8.8 g/dL — ABNORMAL LOW (ref 12.0–15.0)
Hemoglobin: 7.6 g/dL — ABNORMAL LOW (ref 12.0–15.0)

## 2019-02-13 LAB — PREPARE RBC (CROSSMATCH)

## 2019-02-13 LAB — GLUCOSE, CAPILLARY
Glucose-Capillary: 106 mg/dL — ABNORMAL HIGH (ref 70–99)
Glucose-Capillary: 146 mg/dL — ABNORMAL HIGH (ref 70–99)

## 2019-02-13 MED ORDER — SODIUM CHLORIDE 0.9% IV SOLUTION
Freq: Once | INTRAVENOUS | Status: AC
  Administered 2019-02-14: 03:00:00 via INTRAVENOUS

## 2019-02-13 MED ORDER — FAMOTIDINE 20 MG PO TABS
20.0000 mg | ORAL_TABLET | Freq: Every day | ORAL | Status: DC
  Administered 2019-02-13 – 2019-02-17 (×5): 20 mg via ORAL
  Filled 2019-02-13 (×5): qty 1

## 2019-02-13 MED ORDER — SODIUM CHLORIDE 0.9 % IV BOLUS
1000.0000 mL | Freq: Once | INTRAVENOUS | Status: AC | PRN
  Administered 2019-02-13: 1000 mL via INTRAVENOUS

## 2019-02-13 NOTE — Progress Notes (Signed)
Daily Rounding Note  02/13/2019, 9:25 AM  LOS: 1 day   SUBJECTIVE:   Chief complaint: Painless hematochezia, presumed recurrent diverticular bleed.  No BMs.  Last bleeding was a small amount that was already reported early yesterday morning.  Her Hgb has dropped but she denies dizziness, weakness, shortness of breath.  Really wants to go home, says she can stay with her sister if needs be.  OBJECTIVE:         Vital signs in last 24 hours:    Temp:  [97.9 F (36.6 C)-98.2 F (36.8 C)] 98.2 F (36.8 C) (08/09 0527) Pulse Rate:  [69-76] 75 (08/09 0527) Resp:  [16-18] 18 (08/09 0527) BP: (109-116)/(51-64) 110/64 (08/09 0527) SpO2:  [99 %-100 %] 100 % (08/09 0527) Last BM Date: 02/11/19(8/8 per MD note) Filed Weights   02/11/19 1541  Weight: 56.2 kg   General: Pleasant, cooperative, looks well. Heart: RRR. Chest: No labored breathing or cough.  Lungs clear bilaterally. Abdomen: Soft.  Not tender or distended.  Active bowel sounds. Extremities: No CCE. Neuro/Psych: Oriented x3.  Appropriate.  Intake/Output from previous day: 08/08 0701 - 08/09 0700 In: 0  Out: 1 [Urine:1]  Intake/Output this shift: No intake/output data recorded.  Lab Results: Recent Labs    02/12/19 0700 02/12/19 1626 02/13/19 0509  WBC 6.9 5.5 4.4  HGB 8.5* 8.5* 7.6*  HCT 24.8* 24.3* 22.1*  PLT 142* 150 134*   BMET Recent Labs    02/11/19 1545 02/12/19 0700  NA 138 140  K 3.4* 3.2*  CL 106 107  CO2 25 27  GLUCOSE 175* 108*  BUN 17 16  CREATININE 0.90 0.86  CALCIUM 9.5 9.3   LFT Recent Labs    02/11/19 1545  PROT 5.5*  ALBUMIN 3.3*  AST 17  ALT 15  ALKPHOS 65  BILITOT 0.6   PT/INR Recent Labs    02/12/19 0700  LABPROT 15.0  INR 1.2     Ref. Range 02/12/2019 16:26  Iron Latest Ref Range: 28 - 170 ug/dL 128  UIBC Latest Units: ug/dL 144  TIBC Latest Ref Range: 250 - 450 ug/dL 272  Saturation Ratios Latest Ref  Range: 10.4 - 31.8 % 47 (H)  Ferritin Latest Ref Range: 11 - 307 ng/mL 22  Folate Latest Ref Range: >5.9 ng/mL 23.1    Studies/Results: No results found.   Scheduled Meds: . sodium chloride   Intravenous Once  . amLODipine  5 mg Oral Daily  . pantoprazole  40 mg Oral Daily   Continuous Infusions: PRN Meds:.acetaminophen **OR** acetaminophen, hydrALAZINE, ondansetron **OR** ondansetron (ZOFRAN) IV   ASSESMENT:   *    Painless hematochezia.  Presume recurrent diverticular bleed.  Last, small volume, episode more than 24 hours ago.  *    Blood loss anemia.  Hgb unchanged post 1 PRBC 8/7, now down nearly 1 gm over 24 hours.  Microcytic.  Iron, TIBC normal.  Iron saturation above normal.  Ferritin 22.   Folate, B12 at or above normal  *     Hypokalemia.   Persists. Patient previously used prescribed potassium.  She has been using OTC potassium since last week.  She may warrant restart of prescription potassium   *    Gastritis, mild on EGD in 04/2018.  No PPI or H2 blocker at time of admission.  Currently on Protonix 40 mg daily.   PLAN   *   The drop in Hgb concerns me  in regards to discharging the patient.  We should check the Hgb this afternoon and re-evaluate as to whether she should stay 1 more night or can be discharged. Ordered hemoglobins/hematocrit for 2 PM today.  *   Switched the Protonix to Pepcid 20 mg daily.  Avoid Mobic for the next 10 days, then okay to resume.    Azucena Freed  02/13/2019, 9:25 AM Phone [phone]

## 2019-02-13 NOTE — Significant Event (Addendum)
Rapid Response Event Note  Overview: Pt here with GI bleed, RN had walked pt to bathroom. Once on commode, pt began not looking well. RR was then called. While talking to RN, they decided they needed to call a Code Blue.  Time Called: 2254 Arrival Time: 2256 Event Type: Other (Comment)(syncopy, GI bleed)  Initial Focused Assessment: Pt laying in bed with eyes closed, will awaken and answer questions appropriately, yet lethargic. Skin warm and pale. T-98.2, HR-89, BP-135/53, RR-16, SpO2-100% on RA. Code Blue cancelled at this time. Hbg-7.6 @ 2045.  Interventions: CBC, BMP, LA CBG-146 EKG-SR with PVCs 1L NS bolus x 1 1unit PRBCs ordered New PIV. CTA abd/pelvis-already ordered Plan of Care (if not transferred): Bedrest, PRBCs, CTA abd/pelvis, notify PCP of any test abnormalities. Event Summary: Name of Physician Notified: Kennon Holter, NP at [facility]    at          [place], Carren Rang

## 2019-02-13 NOTE — Progress Notes (Signed)
Upon discharge, patient had a medium to large bright red bowel movement. MD Nettey paged. Asked Probation officer to get [employer] of GI doctor. GI MD Armbruster said to keep patient overnight and monitor for any more bloody stools. Also asked that Hgb be checked again later this evening. Patient VS stable, will continue to monitor.

## 2019-02-13 NOTE — Progress Notes (Signed)
GI UPDATE  I received 2 phone calls from the nursing staff this evening. Patient had a moderately bloody bowel movement around 5 PM or so and then again this evening, more blood per nursing. Hgb has drifted to 7.6 from 8.8 earlier today, although this AM was 7.6 as well.   Recommended the patient stay in the hospital given this occurrence. Further given her recurrent bleeding will order a CT angio of the abdomen and pelvis to help clarify location given this is likely a diverticular bleed, and consideration for embolization if active extravasation noted. Her renal function is normal. Have ordered this to be done stat, will need to closely monitor Hgb, and transfuse if further drop and ongoing bleeding, volume resuscitate as needed.   Kingfisher Cellar, MD [facility]

## 2019-02-13 NOTE — Progress Notes (Signed)
MD Nettey notified that patient's tele order has expired and that GI MD recommended a repeat Hgb for this evening.

## 2019-02-13 NOTE — Progress Notes (Deleted)
Nsg Discharge Note  Patient waiting for transportation from friend. Dc paperwork reviewed.  Admit Date:  02/11/2019 Discharge date: 02/13/2019   [facility] to be D/C'd Home per MD order.  AVS completed.  Copy for chart, and copy for patient signed, and dated. Patient/caregiver able to verbalize understanding.  Discharge Medication: Allergies as of 02/13/2019   No Known Allergies     Medication List    STOP taking these medications   colchicine 0.6 MG tablet   diclofenac sodium 1 % Gel Commonly known as: VOLTAREN     TAKE these medications   amLODipine 10 MG tablet Commonly known as: NORVASC TAKE 1 TABLET BY MOUTH DAILY   meloxicam 7.5 MG tablet Commonly known as: MOBIC Take 1 tablet (7.5 mg total) by mouth daily.   Potassium 99 MG Tabs Take 99 mg by mouth daily.       Discharge Assessment: Vitals:   02/13/19 0527 02/13/19 1248  BP: 110/64 136/90  Pulse: 75 91  Resp: 18 16  Temp: 98.2 F (36.8 C) 98.3 F (36.8 C)  SpO2: 100% 100%   Skin clean, dry and intact without evidence of skin break down, no evidence of skin tears noted. IV catheter discontinued intact. Site without signs and symptoms of complications - no redness or edema noted at insertion site, patient denies c/o pain - only slight tenderness at site.  Dressing with slight pressure applied.  D/c Instructions-Education: Discharge instructions given to patient/family with verbalized understanding. D/c education completed with patient/family including follow up instructions, medication list, d/c activities limitations if indicated, with other d/c instructions as indicated by MD - patient able to verbalize understanding, all questions fully answered. Patient instructed to return to ED, call 911, or call MD for any changes in condition.  Patient escorted via [place], and D/C home via private auto.  Erasmo Leventhal, RN 02/13/2019 4:14 PM

## 2019-02-13 NOTE — Discharge Summary (Signed)
Physician Discharge Summary  [place] DGL:[id number] DOB: [birth year] DOA: 02/11/2019  PCP: Horald Pollen, MD  Admit date: 02/11/2019 Discharge date: 02/13/2019  Admitted From: Home Disposition: Home  Recommendations for Outpatient Follow-up:  1. Follow up with PCP in 1 week 2. Follow up with GI 3. Please obtain BMP/CBC in one week 4. Please follow up on the following pending results: None  Home Health: None Equipment/Devices: None  Discharge Condition: Stable CODE STATUS: Full code Diet recommendation: Heart healthy   Brief/Interim Summary:  Admission HPI written by Ivor Costa, MD   Chief Complaint: bloody stool  HPI: Latoya Bennett is a 83 y.o. female with medical history significant of diverticulosis, GI bleeding, hypertension, GERD, gout, PAC, who presents with bloody stool.  Patient states that he has had 2 episodes of bloody stool, one episode yesterday and another episode today.  Patient denies nausea vomiting, abdominal pain.  She states that she feels dizzy on standing, and lowered herself to the ground, no fall, no loss of consciousness or injury.  Patient denies shortness of breath, chest pain, cough, fever or chills.  No symptoms of UTI or unilateral weakness. Pt is taking Mobic.  # Colonoscopy by Dr. Ardis Hughs of [facility] on 02/21/2016 showed diverticulosis # EGD by Dr. Paulita Fujita on 04/18/2018 showed gastritis and small hiatal hernia.  ED Course: pt was found to have hemoglobin dropped from a 10.8 on 05/07/18-8.7, pending FOBT, pending COVID-19 test, potassium 3.4, renal function normal, temperature normal, blood pressure 127/51, no tachycardia, oxygen saturation 93 to 99% on room air.  Patient is placed on telemetry bed for observation.    Hospital course:  Diverticular bleeding Previous history. Bleeding resolved while inpatient. Hemoglobin on admission of 8.7. Patient received a 1 unit PRBC transfusion on 8/8 with change from 8.7 to  8.5 possibly secondary to initial hemoconcerntration. Hemoglobin remained stable at 8.5 on day prior to discharge with decrease to 7.6 the morning of discharge; repeat just prior to discharge was 8.8 without intervention. Stable prior to discharge. GI consulted and recommended no inpatient intervention secondary to resolution of bleeding and patient stability, in addition to discussions with patient for inpatient/outpatient management. Patient to follow-up with GI as an outpatient.  GI bleed Symptomatic anemia Secondary to above.  Discharge Diagnoses:  Principal Problem:   GI bleed Active Problems:   PAC (premature atrial contraction)   Essential hypertension   Hypokalemia   GERD (gastroesophageal reflux disease)   Symptomatic anemia   GIB (gastrointestinal bleeding)   Diverticulosis of colon with hemorrhage   Acute posthemorrhagic anemia    Discharge Instructions  Discharge Instructions    Call MD for:  extreme fatigue   Complete by: As directed    Call MD for:  persistant dizziness or light-headedness   Complete by: As directed      Allergies as of 02/13/2019   No Known Allergies     Medication List    STOP taking these medications   colchicine 0.6 MG tablet   diclofenac sodium 1 % Gel Commonly known as: VOLTAREN     TAKE these medications   amLODipine 10 MG tablet Commonly known as: NORVASC TAKE 1 TABLET BY MOUTH DAILY   meloxicam 7.5 MG tablet Commonly known as: MOBIC Take 1 tablet (7.5 mg total) by mouth daily.   Potassium 99 MG Tabs Take 99 mg by mouth daily.      Follow-up Information    Horald Pollen, MD. Schedule an appointment as soon as possible  for a visit in 1 week(s).   Specialty: Internal Medicine Why: Hospital follow-up Contact information: [place] [address] [phone]        Lavena Bullion, DO. Schedule an appointment as soon as possible for a visit.   Specialty: Gastroenterology Contact  information: [place] [address] [phone]          No Known Allergies  Consultations:  [facility]   Procedures/Studies:  No results found.   Subjective: No bleeding overnight  Discharge Exam: Vitals:   02/13/19 0527 02/13/19 1248  BP: 110/64 136/90  Pulse: 75 91  Resp: 18 16  Temp: 98.2 F (36.8 C) 98.3 F (36.8 C)  SpO2: 100% 100%   Vitals:   02/12/19 1322 02/12/19 2108 02/13/19 0527 02/13/19 1248  BP: 109/60 (!) 116/51 110/64 136/90  Pulse: 69 76 75 91  Resp: 16 16 18 16   Temp: 97.9 F (36.6 C) 98 F (36.7 C) 98.2 F (36.8 C) 98.3 F (36.8 C)  TempSrc: Oral Oral Oral Oral  SpO2: 99% 100% 100% 100%  Weight:      Height:        General: Pt is alert, awake, not in acute distress Cardiovascular: RRR, S1/S2 +, no rubs, no gallops Respiratory: CTA bilaterally, no wheezing, no rhonchi Abdominal: Soft, NT, ND, bowel sounds + Extremities: no edema, no cyanosis    The results of significant diagnostics from this hospitalization (including imaging, microbiology, ancillary and laboratory) are listed below for reference.     Microbiology: Recent Results (from the past 240 hour(s))  SARS Coronavirus 2 [facility] order, Performed in [facility] hospital lab) Nasopharyngeal Nasopharyngeal Swab     Status: None   Collection Time: 02/12/19 12:54 AM   Specimen: Nasopharyngeal Swab  Result Value Ref Range Status   SARS Coronavirus 2 NEGATIVE NEGATIVE Final    Comment: (NOTE) If result is NEGATIVE SARS-CoV-2 target nucleic acids are NOT DETECTED. The SARS-CoV-2 RNA is generally detectable in upper and lower  respiratory specimens during the acute phase of infection. The lowest  concentration of SARS-CoV-2 viral copies this assay can detect is 250  copies / mL. A negative result does not preclude SARS-CoV-2 infection  and should not be used as the sole basis for treatment or other  patient management decisions.  A negative  result may occur with  improper specimen collection / handling, submission of specimen other  than nasopharyngeal swab, presence of viral mutation(s) within the  areas targeted by this assay, and inadequate number of viral copies  (<250 copies / mL). A negative result must be combined with clinical  observations, patient history, and epidemiological information. If result is POSITIVE SARS-CoV-2 target nucleic acids are DETECTED. The SARS-CoV-2 RNA is generally detectable in upper and lower  respiratory specimens dur ing the acute phase of infection.  Positive  results are indicative of active infection with SARS-CoV-2.  Clinical  correlation with patient history and other diagnostic information is  necessary to determine patient infection status.  Positive results do  not rule out bacterial infection or co-infection with other viruses. If result is PRESUMPTIVE POSTIVE SARS-CoV-2 nucleic acids MAY BE PRESENT.   A presumptive positive result was obtained on the submitted specimen  and confirmed on repeat testing.  While 2019 novel coronavirus  (SARS-CoV-2) nucleic acids may be present in the submitted sample  additional confirmatory testing may be necessary for epidemiological  and / or clinical management purposes  to differentiate  between  SARS-CoV-2 and other Sarbecovirus currently known to infect humans.  If clinically indicated additional testing with an alternate test  methodology [phone]) is advised. The SARS-CoV-2 RNA is generally  detectable in upper and lower respiratory sp ecimens during the acute  phase of infection. The expected result is Negative. Fact Sheet for Patients:  StrictlyIdeas.no Fact Sheet for Healthcare Providers: BankingDealers.co.za This test is not yet approved or cleared by the [place] FDA and has been authorized for detection and/or diagnosis of SARS-CoV-2 by FDA under an Emergency Use Authorization  (EUA).  This EUA will remain in effect (meaning this test can be used) for the duration of the COVID-19 declaration under Section 564(b)(1) of the Act, 21 U.S.C. section 360bbb-3(b)(1), unless the authorization is terminated or revoked sooner. Performed at [facility], [place] [address], [place], [place] [address]      Labs: BNP (last 3 results) No results for input(s): BNP in the last 8760 hours. Basic Metabolic Panel: Recent Labs  Lab 02/11/19 1545 02/12/19 0700  NA 138 140  K 3.4* 3.2*  CL 106 107  CO2 25 27  GLUCOSE 175* 108*  BUN 17 16  CREATININE 0.90 0.86  CALCIUM 9.5 9.3  MG  --  1.9   Liver Function Tests: Recent Labs  Lab 02/11/19 1545  AST 17  ALT 15  ALKPHOS 65  BILITOT 0.6  PROT 5.5*  ALBUMIN 3.3*   No results for input(s): LIPASE, AMYLASE in the last 168 hours. No results for input(s): AMMONIA in the last 168 hours. CBC: Recent Labs  Lab 02/11/19 1545 02/12/19 0700 02/12/19 1626 02/13/19 0509 02/13/19 1402  WBC 6.7 6.9 5.5 4.4  --   HGB 8.7* 8.5* 8.5* 7.6* 8.8*  HCT 24.9* 24.8* 24.3* 22.1* 25.0*  MCV 73.5* 75.4* 75.0* 75.7*  --   PLT 171 142* 150 134*  --    Cardiac Enzymes: No results for input(s): CKTOTAL, CKMB, CKMBINDEX, TROPONINI in the last 168 hours. BNP: Invalid input(s): POCBNP CBG: Recent Labs  Lab 02/12/19 0806 02/13/19 0752  GLUCAP 90 106*   D-Dimer No results for input(s): DDIMER in the last 72 hours. Hgb A1c No results for input(s): HGBA1C in the last 72 hours. Lipid Profile No results for input(s): CHOL, HDL, LDLCALC, TRIG, CHOLHDL, LDLDIRECT in the last 72 hours. Thyroid function studies No results for input(s): TSH, T4TOTAL, T3FREE, THYROIDAB in the last 72 hours.  Invalid input(s): FREET3 Anemia work up Recent Labs    02/12/19 1626  VITAMINB12 1,061*  FOLATE 23.1  FERRITIN 22  TIBC 272  IRON 128  RETICCTPCT 2.4   Urinalysis    Component Value Date/Time   COLORURINE STRAW (A) 04/14/2018 1521    APPEARANCEUR CLEAR 04/14/2018 1521   LABSPEC 1.015 04/14/2018 1521   PHURINE 7.0 04/14/2018 1521   GLUCOSEU NEGATIVE 04/14/2018 1521   HGBUR LARGE (A) 04/14/2018 1521   BILIRUBINUR NEGATIVE 04/14/2018 1521   BILIRUBINUR negative 11/08/2015 1258   KETONESUR NEGATIVE 04/14/2018 1521   PROTEINUR NEGATIVE 04/14/2018 1521   UROBILINOGEN 0.2 11/08/2015 1258   UROBILINOGEN 1.0 06/20/2009 1449   NITRITE NEGATIVE 04/14/2018 1521   LEUKOCYTESUR NEGATIVE 04/14/2018 1521   Sepsis Labs Invalid input(s): PROCALCITONIN,  WBC,  LACTICIDVEN Microbiology Recent Results (from the past 240 hour(s))  SARS Coronavirus 2 [facility] order, Performed in [facility] hospital lab) Nasopharyngeal Nasopharyngeal Swab     Status: None   Collection Time: 02/12/19 12:54 AM   Specimen: Nasopharyngeal Swab  Result Value Ref  Range Status   SARS Coronavirus 2 NEGATIVE NEGATIVE Final    Comment: (NOTE) If result is NEGATIVE SARS-CoV-2 target nucleic acids are NOT DETECTED. The SARS-CoV-2 RNA is generally detectable in upper and lower  respiratory specimens during the acute phase of infection. The lowest  concentration of SARS-CoV-2 viral copies this assay can detect is 250  copies / mL. A negative result does not preclude SARS-CoV-2 infection  and should not be used as the sole basis for treatment or other  patient management decisions.  A negative result may occur with  improper specimen collection / handling, submission of specimen other  than nasopharyngeal swab, presence of viral mutation(s) within the  areas targeted by this assay, and inadequate number of viral copies  (<250 copies / mL). A negative result must be combined with clinical  observations, patient history, and epidemiological information. If result is POSITIVE SARS-CoV-2 target nucleic acids are DETECTED. The SARS-CoV-2 RNA is generally detectable in upper and lower  respiratory specimens dur ing the acute phase of infection.  Positive   results are indicative of active infection with SARS-CoV-2.  Clinical  correlation with patient history and other diagnostic information is  necessary to determine patient infection status.  Positive results do  not rule out bacterial infection or co-infection with other viruses. If result is PRESUMPTIVE POSTIVE SARS-CoV-2 nucleic acids MAY BE PRESENT.   A presumptive positive result was obtained on the submitted specimen  and confirmed on repeat testing.  While 2019 novel coronavirus  (SARS-CoV-2) nucleic acids may be present in the submitted sample  additional confirmatory testing may be necessary for epidemiological  and / or clinical management purposes  to differentiate between  SARS-CoV-2 and other Sarbecovirus currently known to infect humans.  If clinically indicated additional testing with an alternate test  methodology [phone]) is advised. The SARS-CoV-2 RNA is generally  detectable in upper and lower respiratory sp ecimens during the acute  phase of infection. The expected result is Negative. Fact Sheet for Patients:  StrictlyIdeas.no Fact Sheet for Healthcare Providers: BankingDealers.co.za This test is not yet approved or cleared by the [place] FDA and has been authorized for detection and/or diagnosis of SARS-CoV-2 by FDA under an Emergency Use Authorization (EUA).  This EUA will remain in effect (meaning this test can be used) for the duration of the COVID-19 declaration under Section 564(b)(1) of the Act, 21 U.S.C. section 360bbb-3(b)(1), unless the authorization is terminated or revoked sooner. Performed at [facility], [place] [address] [place], [place] [address]      Time coordinating discharge: 35 minutes  SIGNED:   Cordelia Poche, MD [facility] 02/13/2019, 2:38 PM

## 2019-02-13 NOTE — Progress Notes (Signed)
CRITICAL VALUE ALERT  Critical Value:  Hemoglobin 6.2  Date & Time Notied:  02/13/2019 2342  Provider Notified: NP X. Blount /Mindy RN rapid response  Orders Received/Actions taken: orders transfuse blood

## 2019-02-13 NOTE — Progress Notes (Addendum)
During shift change patient stated need to use bathroom (stool). RN assisted patient to bathroom; day RN present. Patient had red blood medium sized stool. No change in mentation, A&Ox4 and able to walk back to bed with cane (uses at baseline). Patient expressed anxiety and disappointment with need to continue to receive care and not being discharged.   Paged NP Blount at 2123. Informed pt. had medium frank blood stool at approx. 1930, CBC drawn at 2045: Hgb 7.6.   Called on-call GI at 2200 provided message to [place]; informed to await call-back within 30 minutes and if no call-back to return call.   Spoke with MD Armbruster via phone at 2209.  Informed of second bloody medium sized BM of day during shift change at approx 1930. Armbruster stated will order CT Angio with contrast and needs to reviewed by provider overnight; informed night provider is NP. Provided most recent BUN, Cr, and Hgb from 1402 and 2045.  Approx 2230 RN to patient's room to provide H20 as requested, reassess patient's needs, inform of CT angio. Patient A&Ox4 and indicated may need to use bathroom again (stool). Assisted patient to restroom using cane. Observed frank blood on bed pad and blood with some clots when patient stood up. RN remained with patient while in bathroom and informed not to get up given concerns with bleeding and possible VS. RN applied tele monitor and attempted to collect VS while patient on toilet During RN call to initiate telemetry monitoring patient became weak, eyes rolled into back of head, stated "I don't feel so good," patient flaccid leaned back against toilet and wall, patient did not respond when name called, difficult to palpate pulse. Emergency assistance called. Staff to room to assist with removing patient from bathroom and to bed. RN requested rapid response called and code blue given patient's unresponsiveness in bathroom. Patient became more alert, responsive upon moving to bed. Rapid Response RN  Mindy arrived and admin/staff to assist with code. Code cancelled. EKG completed, VS assessed, labs collected, 1L bolus administered. Patient increasingly alert and oriented NP Blount notified by Rapid Response RN. GI notified. RN and Rapid RN initiated urgent unit of PRBCs and transported patient to CT Angio.   Second 500 mL bolus given at 0059 upon return from CT angio.  Transfusion ended at [place]. Patient able to communicate, A&Ox4, sitting up in bed and expressed understanding of need for interventions and care.   Paged GI at 0138 informed receptionist need to follow up with Armbruster about patient that received emergent unit of blood and results of CT Angiogram.   Spoke with MD Armbruster at 0150 informed of events and stated ok to order another unit, to follow up with primary MD coverage to order 2nd unit and complete H&H after transfusion.   Paged NP Blount at [place] and informed per GI MD Armbruster ok to give patient 2nd unit of PRBC's then H&H after 2nd unit.Please order unit of PRBCs  Paged NP Blount 0422 and requested patient's level of care change to Progressive.   2nd unit RBC complete at 0514. Ordered   Paged NP Blount at [phone] and informed 2nd unit PRBCs complete.Patient A&ox4. BP 91/70 (78), HR 80, 100% on room air. BP still soft post bolus & RBC.  500 mL bolus ordered  During reassessment at 0530 ptient inquired if she "blacked out." Stated only remembers being on toilet and doesn't remember how she got to bed. Provided patient with general overview of events during syncopal episode. Informed  patient of need to remain NPO and strict bedrest and  GI and attending doctors will follow up with her.   Reassessment [phone] patient inquired if she was in "critical" condition because niece (in [facility]) will ask when she calls and her mother (patient's sister) will become very worried. Patient tearful. Informed patient would describe status as stable, but in need of close monitoring given episodes  of bleeding and syncopal episode. Informed now a progressive care patient so staff are aware of increased need for monitoring. Informed patient to let staff know if she would prefer RN or MD to call and update family (with her permission) and encouraged patient to provide as much info as she was comfortable with to family, however, it is ok/appropriate family aware of situation given events.   Notified Rapid RN Nikki of overnight events at [place].

## 2019-02-14 ENCOUNTER — Inpatient Hospital Stay (HOSPITAL_COMMUNITY): Payer: Medicare Other

## 2019-02-14 LAB — CBC
HCT: 28.6 % — ABNORMAL LOW (ref 36.0–46.0)
HCT: 25.4 % — ABNORMAL LOW (ref 36.0–46.0)
Hemoglobin: 10.2 g/dL — ABNORMAL LOW (ref 12.0–15.0)
Hemoglobin: 9 g/dL — ABNORMAL LOW (ref 12.0–15.0)
MCH: 29.3 pg (ref 26.0–34.0)
MCH: 29 pg (ref 26.0–34.0)
MCHC: 35.7 g/dL (ref 30.0–36.0)
MCHC: 35.4 g/dL (ref 30.0–36.0)
MCV: 82.2 fL (ref 80.0–100.0)
MCV: 81.9 fL (ref 80.0–100.0)
Platelets: 122 10*3/uL — ABNORMAL LOW (ref 150–400)
Platelets: 141 10*3/uL — ABNORMAL LOW (ref 150–400)
RBC: 3.48 MIL/uL — ABNORMAL LOW (ref 3.87–5.11)
RBC: 3.1 MIL/uL — ABNORMAL LOW (ref 3.87–5.11)
RDW: 18.6 % — ABNORMAL HIGH (ref 11.5–15.5)
RDW: 18.7 % — ABNORMAL HIGH (ref 11.5–15.5)
WBC: 7.1 10*3/uL (ref 4.0–10.5)
WBC: 5.5 10*3/uL (ref 4.0–10.5)
nRBC: 0.3 % — ABNORMAL HIGH (ref 0.0–0.2)
nRBC: 0 % (ref 0.0–0.2)

## 2019-02-14 LAB — BASIC METABOLIC PANEL
Anion gap: 6 (ref 5–15)
BUN: 7 mg/dL — ABNORMAL LOW (ref 8–23)
CO2: 26 mmol/L (ref 22–32)
Calcium: 8.7 mg/dL — ABNORMAL LOW (ref 8.9–10.3)
Chloride: 107 mmol/L (ref 98–111)
Creatinine, Ser: 0.68 mg/dL (ref 0.44–1.00)
GFR calc Af Amer: >60 mL/min (ref >60)
GFR calc non Af Amer: >60 mL/min (ref >60)
Glucose, Bld: 110 mg/dL — ABNORMAL HIGH (ref 70–99)
Potassium: 3.2 mmol/L — ABNORMAL LOW (ref 3.5–5.1)
Sodium: 139 mmol/L (ref 135–145)

## 2019-02-14 LAB — PREPARE RBC (CROSSMATCH)

## 2019-02-14 LAB — LACTIC ACID, PLASMA
Lactic Acid, Venous: 2.1 mmol/L (ref 0.5–1.9)
Lactic Acid, Venous: 1.2 mmol/L (ref 0.5–1.9)

## 2019-02-14 LAB — GLUCOSE, CAPILLARY: Glucose-Capillary: 106 mg/dL — ABNORMAL HIGH (ref 70–99)

## 2019-02-14 MED ORDER — PEG-KCL-NACL-NASULF-NA ASC-C 100 G PO SOLR
0.5000 | Freq: Once | ORAL | Status: AC
  Administered 2019-02-14: 100 g via ORAL
  Filled 2019-02-14: qty 1

## 2019-02-14 MED ORDER — SODIUM CHLORIDE 0.9% IV SOLUTION
Freq: Once | INTRAVENOUS | Status: AC
  Administered 2019-02-14: 03:00:00 via INTRAVENOUS

## 2019-02-14 MED ORDER — SODIUM CHLORIDE 0.9 % IV BOLUS
500.0000 mL | Freq: Once | INTRAVENOUS | Status: AC
  Administered 2019-02-14: 500 mL via INTRAVENOUS

## 2019-02-14 MED ORDER — IOHEXOL 350 MG/ML SOLN
100.0000 mL | Freq: Once | INTRAVENOUS | Status: AC | PRN
  Administered 2019-02-14: 100 mL via INTRAVENOUS

## 2019-02-14 MED ORDER — PEG-KCL-NACL-NASULF-NA ASC-C 100 G PO SOLR: 1.0000 | Freq: Once | ORAL | Status: DC

## 2019-02-14 MED FILL — Medication: Qty: 1 | Status: AC

## 2019-02-14 NOTE — Progress Notes (Signed)
[facility] ROUNDING NOTE   Subjective: She had recurrence of hematochezia yesterday as she was making arrangements for pickup following anticipated discharge from the hospital.  Events described as BRBPR and near syncopal event overnight.  Was transfused 2 units PRBCs for hemoglobin 6.2, with subsequent hemoglobin 10.2.  CT angiogram was negative for active bleeding.  Did demonstrate diverticulosis.  No bleeding this morning.  She otherwise denies abdominal pain.  Has somewhat limited recollection of last evening's events aside from knowing that she had hematochezia.  Objective: Vital signs in last 24 hours: Temp:  [97.2 F (36.2 C)-98.3 F (36.8 C)] 97.6 F (36.4 C) (08/10 1206) Pulse Rate:  [75-102] 90 (08/10 0521) Resp:  [12-22] 21 (08/10 1154) BP: (91-136)/(46-90) 118/51 (08/10 1154) SpO2:  [98 %-100 %] 99 % (08/10 0755) Last BM Date: 02/13/19 General: NAD Lungs: CTA bilaterally, no wheezes Heart: RRR, no murmurs Abdomen: Soft, NT, ND Ext: No C/C/E    Intake/Output from previous day: 08/09 0701 - 08/10 0700 In: 1644 [Blood:645; IV Piggyback:999] Out: 1000 [Urine:1000] Intake/Output this shift: Total I/O In: -  Out: 1200 [Urine:1200]   Lab Results: Recent Labs    02/13/19 0509  02/13/19 2045 02/13/19 2310 02/14/19 0817  WBC 4.4  --   --  6.8 7.1  HGB 7.6*   < > 7.6* 6.2* 10.2*  PLT 134*  --   --  130* 122*  MCV 75.7*  --   --  77.0* 82.2   < > = values in this interval not displayed.   BMET Recent Labs    02/11/19 1545 02/12/19 0700 02/13/19 2310  NA 138 140 138  K 3.4* 3.2* 3.1*  CL 106 107 106  CO2 25 27 25   GLUCOSE 175* 108* 167*  BUN 17 16 13   CREATININE 0.90 0.86 0.94  CALCIUM 9.5 9.3 8.7*   LFT Recent Labs    02/11/19 1545  PROT 5.5*  ALBUMIN 3.3*  AST 17  ALT 15  ALKPHOS 65  BILITOT 0.6   PT/INR Recent Labs    02/12/19 0700  INR 1.2      Imaging/Other results: Ct Angio Abd/pel W/ And/or W/o  Result Date:  02/14/2019 CLINICAL DATA:  83 year old female with GI bleed. Concern for acute diverticulitis or diverticular bleed. EXAM: CTA ABDOMEN AND PELVIS wITHOUT AND WITH CONTRAST TECHNIQUE: Multidetector CT imaging of the abdomen and pelvis was performed using the standard protocol during bolus administration of intravenous contrast. Multiplanar reconstructed images and MIPs were obtained and reviewed to evaluate the vascular anatomy. CONTRAST:  153mL OMNIPAQUE IOHEXOL 350 MG/ML SOLN COMPARISON:  CT of the abdomen pelvis dated 04/14/2018 FINDINGS: VASCULAR Aorta: Mild atherosclerotic calcification. No aneurysmal dilatation or dissection. Celiac: Patent without evidence of aneurysm, dissection, vasculitis or significant stenosis. SMA: Patent without evidence of aneurysm, dissection, vasculitis or significant stenosis. Renals: Both renal arteries are patent without evidence of aneurysm, dissection, vasculitis, fibromuscular dysplasia or significant stenosis. IMA: Patent without evidence of aneurysm, dissection, vasculitis or significant stenosis. Inflow: Mild atherosclerotic calcification. No aneurysmal dilatation or dissection. The iliac arteries are widely patent. Proximal Outflow: Bilateral common femoral and visualized portions of the superficial and profunda femoral arteries are patent without evidence of aneurysm, dissection, vasculitis or significant stenosis. Veins: The IVC, iliac veins are patent. No portal venous gas. The SMV, splenic vein, and main portal vein are patent. Review of the MIP images confirms the above findings. NON-VASCULAR Lower chest: Left lung base linear atelectasis/scarring. The visualized lung bases are otherwise clear. Coronary  vascular calcification. No intra-abdominal free air or free fluid. Hepatobiliary: The liver is unremarkable. No intrahepatic biliary ductal dilatation. Layering sludge or small stones noted within the gallbladder. No pericholecystic fluid. Pancreas: The pancreas is  somewhat atrophic with areas of dilatation of the main pancreatic duct. There is a coarse calcification in the uncinate process of the pancreas. These findings are similar to the prior CT and may be sequela of chronic and recurrent pancreatitis. MRI may provide better evaluation of the pancreas if clinically indicated. Spleen: Normal in size without focal abnormality. Adrenals/Urinary Tract: The adrenal glands are unremarkable. Bilateral renal parapelvic cysts. There is no hydronephrosis or nephrolithiasis on either side. The urinary bladder is distended and grossly unremarkable. Stomach/Bowel: There is colonic diverticulosis the without definite active inflammatory changes. No active diverticular bleed identified. There is no bowel obstruction. No active inflammatory changes. The appendix is not visualized with certainty. No inflammatory changes identified in the right lower quadrant. Lymphatic: Small retroperitoneal and mesenteric lymph nodes, of indeterminate etiology, possibly reactive. Reproductive: Hysterectomy. No pelvic mass. Other: There is a 1 cm nodular density in the inferior left breast. Clinical correlation is recommended. Musculoskeletal: Osteopenia with degenerative changes of the spine. No acute osseous pathology. IMPRESSION: 1. No acute intra-abdominal or pelvic pathology. No CT evidence of bowel ischemia or active diverticular bleed. 2. Colonic diverticulosis. No bowel obstruction or active inflammation. 3. Atrophic pancreas with areas of dilatation of the main pancreatic duct similar to prior CT and may be sequela of chronic and recurrent pancreatitis. MRI may provide better evaluation of the pancreas if clinically indicated. 4. A 1 cm nodular density in the inferior left breast. Clinical correlation is recommended. Electronically Signed   By: Anner Crete M.D.   On: 02/14/2019 01:26      Assessment and Plan:  83 year old female admitted with hematochezia.  Suspect stuttering  diverticular bleed, but with significant bleeding event overnight and hemoglobin 6.2, requiring 2 unit PRBC transfusion.  CT angiogram was unrevealing for active bleeding.  Currently hemodynamically stable and without bleeding this morning.  Discussed the likely etiology of recurrent diverticular particular bleeding with the patient at length today.  Discussed full DDX.  Given the recurrent nature, negative CTA, will plan on proceeding with colonoscopy.  - Changed to clear diet -N.p.o. at midnight -Prep ordered -Colonoscopy tomorrow for diagnostic and potentially therapeutic intent -Continue H/H trend with continued PRBC transfusions per protocol  The indications, risks, and benefits of colonoscopy were explained to the patient in detail. Risks include but are not limited to bleeding, perforation, adverse reaction to medications, and cardiopulmonary compromise. Sequelae include but are not limited to the possibility of surgery, hospitalization, and mortality. The patient verbalized understanding and wished to proceed. All questions answered. Further recommendations pending results of the exam.     Lavena Bullion, DO  02/14/2019, 12:16 PM [facility] Pager [phone]

## 2019-02-14 NOTE — H&P (View-Only) (Signed)
[facility] ROUNDING NOTE   Subjective: She had recurrence of hematochezia yesterday as she was making arrangements for pickup following anticipated discharge from the hospital.  Events described as BRBPR and near syncopal event overnight.  Was transfused 2 units PRBCs for hemoglobin 6.2, with subsequent hemoglobin 10.2.  CT angiogram was negative for active bleeding.  Did demonstrate diverticulosis.  No bleeding this morning.  She otherwise denies abdominal pain.  Has somewhat limited recollection of last evening's events aside from knowing that she had hematochezia.  Objective: Vital signs in last 24 hours: Temp:  [97.2 F (36.2 C)-98.3 F (36.8 C)] 97.6 F (36.4 C) (08/10 1206) Pulse Rate:  [75-102] 90 (08/10 0521) Resp:  [12-22] 21 (08/10 1154) BP: (91-136)/(46-90) 118/51 (08/10 1154) SpO2:  [98 %-100 %] 99 % (08/10 0755) Last BM Date: 02/13/19 General: NAD Lungs: CTA bilaterally, no wheezes Heart: RRR, no murmurs Abdomen: Soft, NT, ND Ext: No C/C/E    Intake/Output from previous day: 08/09 0701 - 08/10 0700 In: 1644 [Blood:645; IV Piggyback:999] Out: 1000 [Urine:1000] Intake/Output this shift: Total I/O In: -  Out: 1200 [Urine:1200]   Lab Results: Recent Labs    02/13/19 0509  02/13/19 2045 02/13/19 2310 02/14/19 0817  WBC 4.4  --   --  6.8 7.1  HGB 7.6*   < > 7.6* 6.2* 10.2*  PLT 134*  --   --  130* 122*  MCV 75.7*  --   --  77.0* 82.2   < > = values in this interval not displayed.   BMET Recent Labs    02/11/19 1545 02/12/19 0700 02/13/19 2310  NA 138 140 138  K 3.4* 3.2* 3.1*  CL 106 107 106  CO2 25 27 25   GLUCOSE 175* 108* 167*  BUN 17 16 13   CREATININE 0.90 0.86 0.94  CALCIUM 9.5 9.3 8.7*   LFT Recent Labs    02/11/19 1545  PROT 5.5*  ALBUMIN 3.3*  AST 17  ALT 15  ALKPHOS 65  BILITOT 0.6   PT/INR Recent Labs    02/12/19 0700  INR 1.2      Imaging/Other results: Ct Angio Abd/pel W/ And/or W/o  Result Date:  02/14/2019 CLINICAL DATA:  83 year old female with GI bleed. Concern for acute diverticulitis or diverticular bleed. EXAM: CTA ABDOMEN AND PELVIS wITHOUT AND WITH CONTRAST TECHNIQUE: Multidetector CT imaging of the abdomen and pelvis was performed using the standard protocol during bolus administration of intravenous contrast. Multiplanar reconstructed images and MIPs were obtained and reviewed to evaluate the vascular anatomy. CONTRAST:  173mL OMNIPAQUE IOHEXOL 350 MG/ML SOLN COMPARISON:  CT of the abdomen pelvis dated 04/14/2018 FINDINGS: VASCULAR Aorta: Mild atherosclerotic calcification. No aneurysmal dilatation or dissection. Celiac: Patent without evidence of aneurysm, dissection, vasculitis or significant stenosis. SMA: Patent without evidence of aneurysm, dissection, vasculitis or significant stenosis. Renals: Both renal arteries are patent without evidence of aneurysm, dissection, vasculitis, fibromuscular dysplasia or significant stenosis. IMA: Patent without evidence of aneurysm, dissection, vasculitis or significant stenosis. Inflow: Mild atherosclerotic calcification. No aneurysmal dilatation or dissection. The iliac arteries are widely patent. Proximal Outflow: Bilateral common femoral and visualized portions of the superficial and profunda femoral arteries are patent without evidence of aneurysm, dissection, vasculitis or significant stenosis. Veins: The IVC, iliac veins are patent. No portal venous gas. The SMV, splenic vein, and main portal vein are patent. Review of the MIP images confirms the above findings. NON-VASCULAR Lower chest: Left lung base linear atelectasis/scarring. The visualized lung bases are otherwise clear. Coronary  vascular calcification. No intra-abdominal free air or free fluid. Hepatobiliary: The liver is unremarkable. No intrahepatic biliary ductal dilatation. Layering sludge or small stones noted within the gallbladder. No pericholecystic fluid. Pancreas: The pancreas is  somewhat atrophic with areas of dilatation of the main pancreatic duct. There is a coarse calcification in the uncinate process of the pancreas. These findings are similar to the prior CT and may be sequela of chronic and recurrent pancreatitis. MRI may provide better evaluation of the pancreas if clinically indicated. Spleen: Normal in size without focal abnormality. Adrenals/Urinary Tract: The adrenal glands are unremarkable. Bilateral renal parapelvic cysts. There is no hydronephrosis or nephrolithiasis on either side. The urinary bladder is distended and grossly unremarkable. Stomach/Bowel: There is colonic diverticulosis the without definite active inflammatory changes. No active diverticular bleed identified. There is no bowel obstruction. No active inflammatory changes. The appendix is not visualized with certainty. No inflammatory changes identified in the right lower quadrant. Lymphatic: Small retroperitoneal and mesenteric lymph nodes, of indeterminate etiology, possibly reactive. Reproductive: Hysterectomy. No pelvic mass. Other: There is a 1 cm nodular density in the inferior left breast. Clinical correlation is recommended. Musculoskeletal: Osteopenia with degenerative changes of the spine. No acute osseous pathology. IMPRESSION: 1. No acute intra-abdominal or pelvic pathology. No CT evidence of bowel ischemia or active diverticular bleed. 2. Colonic diverticulosis. No bowel obstruction or active inflammation. 3. Atrophic pancreas with areas of dilatation of the main pancreatic duct similar to prior CT and may be sequela of chronic and recurrent pancreatitis. MRI may provide better evaluation of the pancreas if clinically indicated. 4. A 1 cm nodular density in the inferior left breast. Clinical correlation is recommended. Electronically Signed   By: Anner Crete M.D.   On: 02/14/2019 01:26      Assessment and Plan:  83 year old female admitted with hematochezia.  Suspect stuttering  diverticular bleed, but with significant bleeding event overnight and hemoglobin 6.2, requiring 2 unit PRBC transfusion.  CT angiogram was unrevealing for active bleeding.  Currently hemodynamically stable and without bleeding this morning.  Discussed the likely etiology of recurrent diverticular particular bleeding with the patient at length today.  Discussed full DDX.  Given the recurrent nature, negative CTA, will plan on proceeding with colonoscopy.  - Changed to clear diet -N.p.o. at midnight -Prep ordered -Colonoscopy tomorrow for diagnostic and potentially therapeutic intent -Continue H/H trend with continued PRBC transfusions per protocol  The indications, risks, and benefits of colonoscopy were explained to the patient in detail. Risks include but are not limited to bleeding, perforation, adverse reaction to medications, and cardiopulmonary compromise. Sequelae include but are not limited to the possibility of surgery, hospitalization, and mortality. The patient verbalized understanding and wished to proceed. All questions answered. Further recommendations pending results of the exam.     Lavena Bullion, DO  02/14/2019, 12:16 PM [facility] Pager [phone]

## 2019-02-14 NOTE — Progress Notes (Signed)
MD Nettey made aware that there are no current orders to trend patient's H&H. Is okay with that as long as patient is not having bloody stools. Oncoming nurse aware. Will notify MD if patient status changes.

## 2019-02-14 NOTE — Progress Notes (Signed)
Unable to draw blood for labs from [facility] midline. Mendel Ryder, phlebotomist made aware.

## 2019-02-14 NOTE — Progress Notes (Signed)
PT Cancellation Note  Patient Details Name: Latoya Bennett MRN: [id number] DOB: 10-Sep-1935   Cancelled Treatment:    Reason Eval/Treat Not Completed: Other (comment).  Eating a late lunch and will reattempt as time and pt allow.   Ramond Dial 02/14/2019, 2:06 PM   Mee Hives, PT MS Acute Rehab Dept. Number: [place] [facility] [place]

## 2019-02-14 NOTE — Progress Notes (Signed)
PROGRESS NOTE    Xenia  AJG:[id number] DOB: 1936/02/24 DOA: 02/11/2019 PCP: Horald Pollen, MD   Brief Narrative: Latoya Bennett is a 83 y.o. female with a history of diverticulosis, GI bleeding, hypertension, GERD, gout, PACs.  Patient presented secondary to hematochezia in setting of diverticulosis.  GI has been consulted.  Initial plan was for observation and discharge, however, patient has significant pain with profound hematochezia with resultant significant symptomatic anemia and syncope.  Now plan is for diagnostic colonoscopy   Assessment & Plan:   Principal Problem:   Diverticulosis of colon with hemorrhage Active Problems:   PAC (premature atrial contraction)   Essential hypertension   GI bleed   Hypokalemia   GERD (gastroesophageal reflux disease)   Symptomatic anemia   GIB (gastrointestinal bleeding)   Acute posthemorrhagic anemia   Diverticular bleed Patient with recurrent hematochezia.  She has received 3 units of PRBC to date.  History of diverticulosis with confirmed diagnosis on CT angiogram of the abdomen and pelvis.  Overnight, patient has significant pain with profound hematochezia and associated severe blood loss anemia requiring 2 units of PRBC.  GI is on board and is now planning for diagnostic/therapeutic colonoscopy.  Prep started.  Clinical diet currently.  N.p.o. after midnight. -GI recommendations -Serial CBC/hemoglobin and hematocrit  Symptomatic anemia GI bleeding Secondary to above. Management above.  Essential hypertension Patient is on amlodipine as an outpatient.  Patient with some soft blood pressures overnight in setting of significant hematochezia.  Will discontinue amlodipine for now.  GERD -Continue famotidine   DVT prophylaxis: SCDs Code Status:   Code Status: Full Code Family Communication: None Disposition Plan: Discharge pending GI recommendations   Consultants:   [facility]  Procedures:   8/8: 1 unit PRBC  8/10: 2 units PRBC  Antimicrobials:  None   Subjective: Patient reports limited recollection of nights events.  She does not remember getting her IVs.  She remembers using the bathroom and passing out.  No recurrent hematochezia since events overnight.  Objective: Vitals:   02/14/19 0745 02/14/19 0755 02/14/19 1154 02/14/19 1206  BP:  (!) 114/46 (!) 118/51   Pulse:      Resp:  16 (!) 21   Temp: 97.9 F (36.6 C)   97.6 F (36.4 C)  TempSrc: Oral   Oral  SpO2:  99%    Weight:      Height:        Intake/Output Summary (Last 24 hours) at 02/14/2019 1234 Last data filed at 02/14/2019 0840 Gross per 24 hour  Intake 1644 ml  Output 2200 ml  Net -556 ml   Filed Weights   02/11/19 1541  Weight: 56.2 kg    Examination:  General exam: Appears calm and comfortable Respiratory system: Clear to auscultation. Respiratory effort normal. Cardiovascular system: S1 & S2 heard, RRR. No murmurs, rubs, gallops or clicks. Gastrointestinal system: Abdomen is nondistended, soft and nontender. No organomegaly or masses felt. Normal bowel sounds heard. Central nervous system: Alert and oriented. No focal neurological deficits. Extremities: No edema. No calf tenderness Skin: No cyanosis. No rashes Psychiatry: Judgement and insight appear normal. Mood & affect appropriate.     Data Reviewed: I have personally reviewed following labs and imaging studies  CBC: Recent Labs  Lab 02/12/19 0700 02/12/19 1626 02/13/19 0509 02/13/19 1402 02/13/19 2045 02/13/19 2310 02/14/19 0817  WBC 6.9 5.5 4.4  --   --  6.8 7.1  HGB 8.5* 8.5* 7.6* 8.8* 7.6* 6.2* 10.2*  HCT  24.8* 24.3* 22.1* 25.0* 21.9* 18.1* 28.6*  MCV 75.4* 75.0* 75.7*  --   --  77.0* 82.2  PLT 142* 150 134*  --   --  130* 350*   Basic Metabolic Panel: Recent Labs  Lab 02/11/19 1545 02/12/19 0700 02/13/19 2310  NA 138 140 138  K 3.4* 3.2* 3.1*  CL 106 107 106  CO2 25 27  25   GLUCOSE 175* 108* 167*  BUN 17 16 13   CREATININE 0.90 0.86 0.94  CALCIUM 9.5 9.3 8.7*  MG  --  1.9  --    GFR: Estimated Creatinine Clearance: 40.5 mL/min (by C-G formula based on SCr of 0.94 mg/dL). Liver Function Tests: Recent Labs  Lab 02/11/19 1545  AST 17  ALT 15  ALKPHOS 65  BILITOT 0.6  PROT 5.5*  ALBUMIN 3.3*   No results for input(s): LIPASE, AMYLASE in the last 168 hours. No results for input(s): AMMONIA in the last 168 hours. Coagulation Profile: Recent Labs  Lab 02/12/19 0700  INR 1.2   Cardiac Enzymes: No results for input(s): CKTOTAL, CKMB, CKMBINDEX, TROPONINI in the last 168 hours. BNP (last 3 results) No results for input(s): PROBNP in the last 8760 hours. HbA1C: No results for input(s): HGBA1C in the last 72 hours. CBG: Recent Labs  Lab 02/12/19 0806 02/13/19 0752 02/13/19 2306 02/14/19 0744  GLUCAP 90 106* 146* 106*   Lipid Profile: No results for input(s): CHOL, HDL, LDLCALC, TRIG, CHOLHDL, LDLDIRECT in the last 72 hours. Thyroid Function Tests: No results for input(s): TSH, T4TOTAL, FREET4, T3FREE, THYROIDAB in the last 72 hours. Anemia Panel: Recent Labs    02/12/19 1626  VITAMINB12 1,061*  FOLATE 23.1  FERRITIN 22  TIBC 272  IRON 128  RETICCTPCT 2.4   Sepsis Labs: Recent Labs  Lab 02/13/19 2310 02/14/19 0817  LATICACIDVEN 2.1* 1.2    Recent Results (from the past 240 hour(s))  SARS Coronavirus 2 [facility] order, Performed in [place] hospital lab) Nasopharyngeal Nasopharyngeal Swab     Status: None   Collection Time: 02/12/19 12:54 AM   Specimen: Nasopharyngeal Swab  Result Value Ref Range Status   SARS Coronavirus 2 NEGATIVE NEGATIVE Final    Comment: (NOTE) If result is NEGATIVE SARS-CoV-2 target nucleic acids are NOT DETECTED. The SARS-CoV-2 RNA is generally detectable in upper and lower  respiratory specimens during the acute phase of infection. The lowest  concentration of SARS-CoV-2 viral copies this  assay can detect is 250  copies / mL. A negative result does not preclude SARS-CoV-2 infection  and should not be used as the sole basis for treatment or other  patient management decisions.  A negative result may occur with  improper specimen collection / handling, submission of specimen other  than nasopharyngeal swab, presence of viral mutation(s) within the  areas targeted by this assay, and inadequate number of viral copies  (<250 copies / mL). A negative result must be combined with clinical  observations, patient history, and epidemiological information. If result is POSITIVE SARS-CoV-2 target nucleic acids are DETECTED. The SARS-CoV-2 RNA is generally detectable in upper and lower  respiratory specimens dur ing the acute phase of infection.  Positive  results are indicative of active infection with SARS-CoV-2.  Clinical  correlation with patient history and other diagnostic information is  necessary to determine patient infection status.  Positive results do  not rule out bacterial infection or co-infection with other viruses. If result is PRESUMPTIVE POSTIVE SARS-CoV-2 nucleic acids MAY BE PRESENT.  A presumptive positive result was obtained on the submitted specimen  and confirmed on repeat testing.  While 2019 novel coronavirus  (SARS-CoV-2) nucleic acids may be present in the submitted sample  additional confirmatory testing may be necessary for epidemiological  and / or clinical management purposes  to differentiate between  SARS-CoV-2 and other Sarbecovirus currently known to infect humans.  If clinically indicated additional testing with an alternate test  methodology [phone]) is advised. The SARS-CoV-2 RNA is generally  detectable in upper and lower respiratory sp ecimens during the acute  phase of infection. The expected result is Negative. Fact Sheet for Patients:  StrictlyIdeas.no Fact Sheet for Healthcare  Providers: BankingDealers.co.za This test is not yet approved or cleared by the [place] FDA and has been authorized for detection and/or diagnosis of SARS-CoV-2 by FDA under an Emergency Use Authorization (EUA).  This EUA will remain in effect (meaning this test can be used) for the duration of the COVID-19 declaration under Section 564(b)(1) of the Act, 21 U.S.C. section 360bbb-3(b)(1), unless the authorization is terminated or revoked sooner. Performed at [facility], [place] [address] [place], [place] [address]          Radiology Studies: Ct Angio Abd/pel W/ And/or W/o  Result Date: 02/14/2019 CLINICAL DATA:  83 year old female with GI bleed. Concern for acute diverticulitis or diverticular bleed. EXAM: CTA ABDOMEN AND PELVIS wITHOUT AND WITH CONTRAST TECHNIQUE: Multidetector CT imaging of the abdomen and pelvis was performed using the standard protocol during bolus administration of intravenous contrast. Multiplanar reconstructed images and MIPs were obtained and reviewed to evaluate the vascular anatomy. CONTRAST:  167mL OMNIPAQUE IOHEXOL 350 MG/ML SOLN COMPARISON:  CT of the abdomen pelvis dated 04/14/2018 FINDINGS: VASCULAR Aorta: Mild atherosclerotic calcification. No aneurysmal dilatation or dissection. Celiac: Patent without evidence of aneurysm, dissection, vasculitis or significant stenosis. SMA: Patent without evidence of aneurysm, dissection, vasculitis or significant stenosis. Renals: Both renal arteries are patent without evidence of aneurysm, dissection, vasculitis, fibromuscular dysplasia or significant stenosis. IMA: Patent without evidence of aneurysm, dissection, vasculitis or significant stenosis. Inflow: Mild atherosclerotic calcification. No aneurysmal dilatation or dissection. The iliac arteries are widely patent. Proximal Outflow: Bilateral common femoral and visualized portions of the superficial and profunda femoral arteries are patent  without evidence of aneurysm, dissection, vasculitis or significant stenosis. Veins: The IVC, iliac veins are patent. No portal venous gas. The SMV, splenic vein, and main portal vein are patent. Review of the MIP images confirms the above findings. NON-VASCULAR Lower chest: Left lung base linear atelectasis/scarring. The visualized lung bases are otherwise clear. Coronary vascular calcification. No intra-abdominal free air or free fluid. Hepatobiliary: The liver is unremarkable. No intrahepatic biliary ductal dilatation. Layering sludge or small stones noted within the gallbladder. No pericholecystic fluid. Pancreas: The pancreas is somewhat atrophic with areas of dilatation of the main pancreatic duct. There is a coarse calcification in the uncinate process of the pancreas. These findings are similar to the prior CT and may be sequela of chronic and recurrent pancreatitis. MRI may provide better evaluation of the pancreas if clinically indicated. Spleen: Normal in size without focal abnormality. Adrenals/Urinary Tract: The adrenal glands are unremarkable. Bilateral renal parapelvic cysts. There is no hydronephrosis or nephrolithiasis on either side. The urinary bladder is distended and grossly unremarkable. Stomach/Bowel: There is colonic diverticulosis the without definite active inflammatory changes. No active diverticular bleed identified. There is no bowel obstruction. No active inflammatory changes. The appendix is not visualized with certainty. No inflammatory changes  identified in the right lower quadrant. Lymphatic: Small retroperitoneal and mesenteric lymph nodes, of indeterminate etiology, possibly reactive. Reproductive: Hysterectomy. No pelvic mass. Other: There is a 1 cm nodular density in the inferior left breast. Clinical correlation is recommended. Musculoskeletal: Osteopenia with degenerative changes of the spine. No acute osseous pathology. IMPRESSION: 1. No acute intra-abdominal or pelvic  pathology. No CT evidence of bowel ischemia or active diverticular bleed. 2. Colonic diverticulosis. No bowel obstruction or active inflammation. 3. Atrophic pancreas with areas of dilatation of the main pancreatic duct similar to prior CT and may be sequela of chronic and recurrent pancreatitis. MRI may provide better evaluation of the pancreas if clinically indicated. 4. A 1 cm nodular density in the inferior left breast. Clinical correlation is recommended. Electronically Signed   By: Anner Crete M.D.   On: 02/14/2019 01:26        Scheduled Meds:  sodium chloride   Intravenous Once   amLODipine  5 mg Oral Daily   famotidine  20 mg Oral Daily   Continuous Infusions:   LOS: 2 days     Cordelia Poche, MD [facility] 02/14/2019, 12:34 PM  If 7PM-7AM, please contact night-coverage [email or web address]

## 2019-02-14 NOTE — Progress Notes (Signed)
This is an 83 year old female who is hospitalized for GI bleed. Internal medicine teaching service responded to code blue at approximately 2254 last night. Bedside RN notes that patient was in the restroom at which time she had a large bowel movement. Shortly after that, patient became unresponsive. Due to questionable palpable pulse, code blue was called. Upon arrival, patient was lying in bed and was able to respond to questions. She was quite lethargic. VSS however irregular rhythm onEKG.  CBC, BMP, LA, CBG ordered. 1U PRBC ordered. GI and hospitalist notified.

## 2019-02-14 NOTE — Progress Notes (Signed)
GI UPDATE:  Called by nursing staff. Patient had a syncopal episode a few hours ago, Hgb dropped to 6.2 prior to administration of first unit PRBC. CT angio done, no active bleeding which is reassuring. Nursing states no more episodes since we last spoke, patient is doing better post transfusion. Would give another unit of PRBC this evening, check post transfusion H/H and monitor for recurrent bleeding. GI service will reassess in the AM, call if significant bleeding in the interim.  Buffalo Cellar, MD [facility]

## 2019-02-14 NOTE — Progress Notes (Signed)
CRITICAL VALUE ALERT  Critical Value:  Lactic acid 2.1  Date & Time Notied:  02/14/2019 0011  Provider Notified: NP X.Blount  Orders Received/Actions taken: awaiting orders

## 2019-02-15 ENCOUNTER — Inpatient Hospital Stay (HOSPITAL_COMMUNITY): Payer: Medicare Other | Admitting: Anesthesiology

## 2019-02-15 ENCOUNTER — Encounter (HOSPITAL_COMMUNITY)
Admission: EM | Disposition: A | Discharge status: Home / Self Care | Payer: Self-pay | Source: Home / Self Care | Attending: Family Medicine

## 2019-02-15 ENCOUNTER — Encounter (HOSPITAL_COMMUNITY): Payer: Self-pay | Admitting: *Deleted

## 2019-02-15 DIAGNOSIS — D649 Anemia, unspecified: Secondary | ICD-10-CM

## 2019-02-15 HISTORY — PX: COLONOSCOPY: SHX5424

## 2019-02-15 LAB — BPAM RBC
Blood Product Expiration Date: 202009022359
Blood Product Expiration Date: 202009022359
Blood Product Expiration Date: 202009022359
ISSUE DATE / TIME: 202008080229
ISSUE DATE / TIME: 202008092345
ISSUE DATE / TIME: 202008100239
Unit Type and Rh: 5100
Unit Type and Rh: 5100
Unit Type and Rh: 5100

## 2019-02-15 LAB — TYPE AND SCREEN
ABO/RH(D): O POS
Antibody Screen: NEGATIVE
Unit division: 0
Unit division: 0
Unit division: 0

## 2019-02-15 SURGERY — COLONOSCOPY
Anesthesia: Monitor Anesthesia Care | Laterality: Left

## 2019-02-15 MED ORDER — LACTATED RINGERS IV SOLN
INTRAVENOUS | Status: DC
  Administered 2019-02-15: 11:00:00 via INTRAVENOUS

## 2019-02-15 MED ORDER — PHENYLEPHRINE 40 MCG/ML (10ML) SYRINGE FOR IV PUSH (FOR BLOOD PRESSURE SUPPORT)
PREFILLED_SYRINGE | INTRAVENOUS | Status: DC | PRN
  Administered 2019-02-15 (×3): 80 ug via INTRAVENOUS

## 2019-02-15 MED ORDER — PROPOFOL 10 MG/ML IV BOLUS
INTRAVENOUS | Status: DC | PRN
  Administered 2019-02-15: 20 mg via INTRAVENOUS

## 2019-02-15 MED ORDER — SODIUM CHLORIDE 0.9 % IV SOLN: INTRAVENOUS | Status: DC

## 2019-02-15 MED ORDER — PROPOFOL 500 MG/50ML IV EMUL
INTRAVENOUS | Status: DC | PRN
  Administered 2019-02-15: 100 ug/kg/min via INTRAVENOUS

## 2019-02-15 NOTE — Anesthesia Procedure Notes (Signed)
Procedure Name: MAC Date/Time: 02/15/2019 11:33 AM Performed by: Genelle Bal, CRNA Pre-anesthesia Checklist: Patient identified, Emergency Drugs available, Suction available, Patient being monitored and Timeout performed Patient Re-evaluated:Patient Re-evaluated prior to induction Oxygen Delivery Method: Simple face mask

## 2019-02-15 NOTE — Interval H&P Note (Signed)
History and Physical Interval Note:  02/15/2019 11:17 AM  [facility]  has presented today for surgery, with the diagnosis of Diverticulosis, HEmatochezia.  The various methods of treatment have been discussed with the patient and family. After consideration of risks, benefits and other options for treatment, the patient has consented to  Procedure(s): COLONOSCOPY (Left) as a surgical intervention.  The patient's history has been reviewed, patient examined, no change in status, stable for surgery.  I have reviewed the patient's chart and labs.  Questions were answered to the patient's satisfaction.     Latoya Bennett

## 2019-02-15 NOTE — Progress Notes (Signed)
Physical Therapy Treatment Patient Details Name: Latoya Bennett MRN: [id number] DOB: 1936-03-23 Today's Date: 02/15/2019    History of Present Illness Patient is an 83 y/o fmealie with PMH of diverticulitis, GI bleed, HTN, and arthritis/gout with recent flare who presents to the ED with a chief compliant of rectal bleeding..   pt had more bleeding during stay and went for colonoscopy on 02-15-2019.    PT Comments    Pt just had colonoscopy and didn't want to walk as afraid she will bleed again.  Today we focused on mobilty with minimal straining and talking about home ADLs etc and her technque - emphsis on education to increase her safety and decreasing her stress.  When she even talked about the things that stress her out - her HR would go up 20 beats.  Pt planning to move to [facility] to be near her niece when she gets DCed to have additional help.   Follow Up Recommendations  No PT follow up     Equipment Recommendations  None recommended by PT    Recommendations for Other Services       Precautions / Restrictions Precautions Precautions: Fall Precaution Comments: pt denies falls except when bleeding and blood is low    Mobility  Bed Mobility               General bed mobility comments: pt reports seh has slept on couch for lots of years - easy to get up and has coffee table right beside her. today we reviewed importance of rolling when getting up - to avoid any straining with sittign or laying down.  Transfers Overall transfer level: Modified independent               General transfer comment: pt did bed to [facility] transfer - discussing safety.  We talked about her toileting technique at home. she keeps containers under coffee table and can pee in them and then use her small cart to wheel to bathroom to empty. she has done this for years. she goes to toilet for BMs and denies constipation - knows to use stool softeners  Ambulation/Gait             General Gait  Details: pt refused to walk today - she just had colonoscopy and is afraid of making herself bleed again.  we reviewed mobilty with low straining - talking about increasing her walking with RW - to help take weight off her sore knees   Stairs             Wheelchair Mobility    Modified Rankin (Stroke Patients Only)       Balance                                            Cognition Arousal/Alertness: Awake/alert Behavior During Therapy: WFL for tasks assessed/performed Overall Cognitive Status: Within Functional Limits for tasks assessed                                 General Comments: discussed pts home living situation - ways to modify.  importance of not straining with her current routine      Exercises      General Comments        Pertinent Vitals/Pain Pain Assessment: 0-10 Pain Location: right knee -  no pain at rest and 6/10 when up Pain Descriptors / Indicators: Aching Pain Intervention(s): Monitored during session    Home Living                      Prior Function            PT Goals (current goals can now be found in the care plan section) Progress towards PT goals: Progressing toward goals    Frequency    Min 3X/week      PT Plan Current plan remains appropriate    Co-evaluation              AM-PAC PT "6 Clicks" Mobility   Outcome Measure  Help needed turning from your back to your side while in a flat bed without using bedrails?: None Help needed moving from lying on your back to sitting on the side of a flat bed without using bedrails?: None Help needed moving to and from a bed to a chair (including a wheelchair)?: None Help needed standing up from a chair using your arms (e.g., wheelchair or bedside chair)?: None Help needed to walk in hospital room?: A Little Help needed climbing 3-5 steps with a railing? : A Little 6 Click Score: 22    End of Session   Activity Tolerance: Patient  tolerated treatment well Patient left: in bed;with call bell/phone within reach Nurse Communication: Mobility status PT Visit Diagnosis: Other abnormalities of gait and mobility (R26.89)     Time: 1430-1445 PT Time Calculation (min) (ACUTE ONLY): 15 min  Charges:  $Therapeutic Activity: 8-22 mins                    02/15/2019   Latoya Bennett, PT    [name] 02/15/2019, 3:01 PM

## 2019-02-15 NOTE — Op Note (Signed)
[facility] Patient Name: [facility] Procedure Date : 02/15/2019 MRN: [id number] Attending MD: Gerrit Heck , MD Date of Birth: 1935/11/13 CSN: [id number] Age: 83 Admit Type: Inpatient Procedure:                Colonoscopy Indications:              Hematochezia, Acute post hemorrhagic anemia                           83 yo female with a prior history of diverticular                            bleeding presents with painless hematochezia. CT                            Angio was unrevealing. Transfused 2U pRBCs on this                            admission. Providers:                Gerrit Heck, MD, Baird Cancer, RN, Ashley Jacobs, RN, Cherylynn Ridges, Technician Referring MD:              Medicines:                Monitored Anesthesia Care Complications:            No immediate complications. Estimated Blood Loss:     Estimated blood loss was minimal. Procedure:                Pre-Anesthesia Assessment:                           - Prior to the procedure, a History and Physical                            was performed, and patient medications and                            allergies were reviewed. The patient's tolerance of                            previous anesthesia was also reviewed. The risks                            and benefits of the procedure and the sedation                            options and risks were discussed with the patient.                            All questions were answered, and informed consent                            was  obtained. Prior Anticoagulants: The patient has                            taken no previous anticoagulant or antiplatelet                            agents. ASA Grade Assessment: II - A patient with                            mild systemic disease. After reviewing the risks                            and benefits, the patient was deemed in                            satisfactory  condition to undergo the procedure.                           After obtaining informed consent, the colonoscope                            was passed under direct vision. Throughout the                            procedure, the patient's blood pressure, pulse, and                            oxygen saturations were monitored continuously. The                            [id number]-H190DL ([id number]) Olympus pediatric colonoscope                            was introduced through the anus and advanced to the                            the terminal ileum. The colonoscopy was performed                            without difficulty. The patient tolerated the                            procedure well. The quality of the bowel                            preparation was adequate. The terminal ileum,                            ileocecal valve, appendiceal orifice, and rectum                            were photographed. Scope In: 11:33:29 AM Scope Out: 11:58:53 AM Scope Withdrawal Time: 0 hours 21 minutes 43 seconds  Total Procedure Duration: 0 hours 25 minutes 24  seconds  Findings:      The perianal and digital rectal examinations were normal.      Many small and large-mouthed diverticula were found in the entire colon.       Each of the diverticula were lavaged using copious amounts of sterile       water, resulting in clearance with good visualization. No active       bleeding nor stigmata of recent site of bleeding noted.      Clotted blood and blood tinged fluid was found in the rectum, in the       recto-sigmoid colon, in the sigmoid colon and in the distal descending       colon. The colon lumen was aggressively irrigated with copious amounts       of sterile water, resulting in clearance with good visualization. The       blood tinged fluid resolved and most clots were removed as well,       resulting in good visualization. No active bleeding noted. No return of       blood tinged fluid. The  colonsocope was then re-advanced to the cecum       and again withdran slowly for a 2nd look thoughout the colon. No       bleeding noted, and no return of blood tinged fluid. No high grade       stigmata of bleeding seen.      The retroflexed view of the distal rectum and anal verge was normal and       showed no anal or rectal abnormalities.      Two sessile polyps were found in the transverse colon and ascending       colon. The polyps were 2 to 5 mm in size. Given the obscure GI bleed,       these were not resected so to not complicate the course with a possible       post polypectomy bleed.      The terminal ileum appeared normal. Impression:               - Diverticulosis in the entire examined colon.                           - Blood in the rectum, in the recto-sigmoid colon,                            in the sigmoid colon and in the distal descending                            colon.                           - The distal rectum and anal verge are normal on                            retroflexion view.                           - Two 2 to 5 mm polyps in the transverse colon and  in the ascending colon.                           - The examined portion of the ileum was normal.                           - No specimens collected. Recommendation:           - Return patient to hospital ward for ongoing care.                           - Full liquid diet today.                           - Continue present medications.                           - Conitnue to observe for recurrenc of bleeding.                           - Resume serial H/H checks Q12 hours for now.                           - If rebleeding, will consider flexible                            sigmoidoscopy given suspicion for left sided source                            of bleeding vs repeat CTA.                           - Can discuss the role of repeat colonoscopy for                             polyp resection as an outpatient. Procedure Code(s):        --- Professional ---                           [phone], Colonoscopy, flexible; diagnostic, including                            collection of specimen(s) by brushing or washing,                            when performed (separate procedure) Diagnosis Code(s):        --- Professional ---                           K62.5, Hemorrhage of anus and rectum                           K92.2, Gastrointestinal hemorrhage, unspecified                           K63.5, Polyp of colon  K92.1, Melena (includes Hematochezia)                           D62, Acute posthemorrhagic anemia                           K57.30, Diverticulosis of large intestine without                            perforation or abscess without bleeding CPT copyright 2019 American Medical Association. All rights reserved. The codes documented in this report are preliminary and upon coder review may  be revised to meet current compliance requirements. Gerrit Heck, MD 02/15/2019 12:12:10 PM Number of Addenda: 0

## 2019-02-15 NOTE — Transfer of Care (Signed)
Immediate Anesthesia Transfer of Care Note  Patient: Latoya Bennett  Procedure(s) Performed: COLONOSCOPY (Left )  Patient Location: Endoscopy Unit  Anesthesia Type:MAC  Level of Consciousness: awake, alert  and oriented  Airway & Oxygen Therapy: Patient Spontanous Breathing and Patient connected to nasal cannula oxygen  Post-op Assessment: Report given to RN and Post -op Vital signs reviewed and stable  Post vital signs: Reviewed and stable  Last Vitals:  Vitals Value Taken Time  BP    Temp    Pulse    Resp    SpO2      Last Pain:  Vitals:   02/15/19 1030  TempSrc: Oral  PainSc: 0-No pain         Complications: No apparent anesthesia complications

## 2019-02-15 NOTE — Care Management Important Message (Signed)
Important Message  Patient Details  Name: Latoya Bennett MRN: [id number] Date of Birth: 07-26-1935   Medicare Important Message Given:  Yes     Sedrick Tober 02/15/2019, 1:16 PM

## 2019-02-15 NOTE — Anesthesia Postprocedure Evaluation (Signed)
Anesthesia Post Note  Patient: Latoya Bennett  Procedure(s) Performed: COLONOSCOPY (Left )     Patient location during evaluation: PACU Anesthesia Type: MAC Level of consciousness: awake and alert Pain management: pain level controlled Vital Signs Assessment: post-procedure vital signs reviewed and stable Respiratory status: spontaneous breathing and respiratory function stable Cardiovascular status: stable Postop Assessment: no apparent nausea or vomiting Anesthetic complications: no    Last Vitals:  Vitals:   02/15/19 1201 02/15/19 1210  BP: (!) 105/54 (!) 131/47  Pulse: 83   Resp: 20 18  Temp: 36.7 C   SpO2: 100% 100%    Last Pain:  Vitals:   02/15/19 1201  TempSrc: Temporal  PainSc:                  Olisa Quesnel DANIEL

## 2019-02-15 NOTE — Progress Notes (Signed)
PROGRESS NOTE    [place]  GUR:[id number] DOB: 1936/06/26 DOA: 02/11/2019 PCP: Horald Pollen, MD   Brief Narrative: Latoya Bennett is a 83 y.o. female with a history of diverticulosis, GI bleeding, hypertension, GERD, gout, PACs.  Patient presented secondary to hematochezia in setting of diverticulosis.  GI has been consulted.  Initial plan was for observation and discharge, however, patient has significant pain with profound hematochezia with resultant significant symptomatic anemia and syncope.  Now plan is for diagnostic colonoscopy   Assessment & Plan:   Principal Problem:   Diverticulosis of colon with hemorrhage Active Problems:   PAC (premature atrial contraction)   Essential hypertension   GI bleed   Hypokalemia   GERD (gastroesophageal reflux disease)   Symptomatic anemia   GIB (gastrointestinal bleeding)   Acute posthemorrhagic anemia   Diverticular bleed Patient with recurrent hematochezia.  She has received 5 units of PRBC to date.  History of diverticulosis with confirmed diagnosis on CT angiogram of the abdomen and pelvis.  Overnight on 8/10, patient had significant pain with profound hematochezia and associated severe blood loss anemia requiring 2 units of PRBC. Colonoscopy performed and significant for blood throughout sigmoid and distal descending colon with no source of bleeding.  -GI recommendations: restart diet -CBC in AM  Symptomatic anemia GI bleeding Secondary to above. Management above.  Essential hypertension Patient is on amlodipine as an outpatient. -Hold BP meds secondary to bleeding -Hydralazine IV prn  GERD -Continue famotidine   DVT prophylaxis: SCDs Code Status:   Code Status: Full Code Family Communication: None Disposition Plan: Discharge pending GI recommendations   Consultants:   [facility]  Procedures:   8/8: 1 unit PRBC  8/10: 2 units  PRBC  Antimicrobials:  None   Subjective: No issues overnight.  Objective: Vitals:   02/15/19 1201 02/15/19 1210 02/15/19 1220 02/15/19 1252  BP: (!) 105/54 (!) 131/47 (!) 155/55 (!) 153/84  Pulse: 83   63  Resp: 20 18 19  (!) 21  Temp: 98 F (36.7 C)   (!) 97.5 F (36.4 C)  TempSrc: Temporal   Oral  SpO2: 100% 100% 100% 100%  Weight:      Height:        Intake/Output Summary (Last 24 hours) at 02/15/2019 1318 Last data filed at 02/15/2019 1156 Gross per 24 hour  Intake 480 ml  Output 700 ml  Net -220 ml   Filed Weights   02/11/19 1541  Weight: 56.2 kg    Examination:  General exam: Appears calm and comfortable Respiratory system: Clear to auscultation. Respiratory effort normal. Cardiovascular system: S1 & S2 heard, RRR. No murmurs, rubs, gallops or clicks. Gastrointestinal system: Abdomen is nondistended, soft and nontender. No organomegaly or masses felt. Normal bowel sounds heard. Central nervous system: Alert and oriented. No focal neurological deficits. Extremities: No edema. No calf tenderness Skin: No cyanosis. No rashes Psychiatry: Judgement and insight appear normal. Mood & affect appropriate.      Data Reviewed: I have personally reviewed following labs and imaging studies  CBC: Recent Labs  Lab 02/12/19 1626 02/13/19 0509 02/13/19 1402 02/13/19 2045 02/13/19 2310 02/14/19 0817 02/14/19 2218  WBC 5.5 4.4  --   --  6.8 7.1 5.5  HGB 8.5* 7.6* 8.8* 7.6* 6.2* 10.2* 9.0*  HCT 24.3* 22.1* 25.0* 21.9* 18.1* 28.6* 25.4*  MCV 75.0* 75.7*  --   --  77.0* 82.2 81.9  PLT 150 134*  --   --  130* 122* 141*   Basic  Metabolic Panel: Recent Labs  Lab 02/11/19 1545 02/12/19 0700 02/13/19 2310 02/14/19 2218  NA 138 140 138 139  K 3.4* 3.2* 3.1* 3.2*  CL 106 107 106 107  CO2 25 27 25 26   GLUCOSE 175* 108* 167* 110*  BUN 17 16 13  7*  CREATININE 0.90 0.86 0.94 0.68  CALCIUM 9.5 9.3 8.7* 8.7*  MG  --  1.9  --   --    GFR: Estimated Creatinine  Clearance: 47.6 mL/min (by C-G formula based on SCr of 0.68 mg/dL). Liver Function Tests: Recent Labs  Lab 02/11/19 1545  AST 17  ALT 15  ALKPHOS 65  BILITOT 0.6  PROT 5.5*  ALBUMIN 3.3*   No results for input(s): LIPASE, AMYLASE in the last 168 hours. No results for input(s): AMMONIA in the last 168 hours. Coagulation Profile: Recent Labs  Lab 02/12/19 0700  INR 1.2   Cardiac Enzymes: No results for input(s): CKTOTAL, CKMB, CKMBINDEX, TROPONINI in the last 168 hours. BNP (last 3 results) No results for input(s): PROBNP in the last 8760 hours. HbA1C: No results for input(s): HGBA1C in the last 72 hours. CBG: Recent Labs  Lab 02/12/19 0806 02/13/19 0752 02/13/19 2306 02/14/19 0744  GLUCAP 90 106* 146* 106*   Lipid Profile: No results for input(s): CHOL, HDL, LDLCALC, TRIG, CHOLHDL, LDLDIRECT in the last 72 hours. Thyroid Function Tests: No results for input(s): TSH, T4TOTAL, FREET4, T3FREE, THYROIDAB in the last 72 hours. Anemia Panel: Recent Labs    02/12/19 1626  VITAMINB12 1,061*  FOLATE 23.1  FERRITIN 22  TIBC 272  IRON 128  RETICCTPCT 2.4   Sepsis Labs: Recent Labs  Lab 02/13/19 2310 02/14/19 0817  LATICACIDVEN 2.1* 1.2    Recent Results (from the past 240 hour(s))  SARS Coronavirus 2 [facility] order, Performed in [facility] hospital lab) Nasopharyngeal Nasopharyngeal Swab     Status: None   Collection Time: 02/12/19 12:54 AM   Specimen: Nasopharyngeal Swab  Result Value Ref Range Status   SARS Coronavirus 2 NEGATIVE NEGATIVE Final    Comment: (NOTE) If result is NEGATIVE SARS-CoV-2 target nucleic acids are NOT DETECTED. The SARS-CoV-2 RNA is generally detectable in upper and lower  respiratory specimens during the acute phase of infection. The lowest  concentration of SARS-CoV-2 viral copies this assay can detect is 250  copies / mL. A negative result does not preclude SARS-CoV-2 infection  and should not be used as the sole basis for  treatment or other  patient management decisions.  A negative result may occur with  improper specimen collection / handling, submission of specimen other  than nasopharyngeal swab, presence of viral mutation(s) within the  areas targeted by this assay, and inadequate number of viral copies  (<250 copies / mL). A negative result must be combined with clinical  observations, patient history, and epidemiological information. If result is POSITIVE SARS-CoV-2 target nucleic acids are DETECTED. The SARS-CoV-2 RNA is generally detectable in upper and lower  respiratory specimens dur ing the acute phase of infection.  Positive  results are indicative of active infection with SARS-CoV-2.  Clinical  correlation with patient history and other diagnostic information is  necessary to determine patient infection status.  Positive results do  not rule out bacterial infection or co-infection with other viruses. If result is PRESUMPTIVE POSTIVE SARS-CoV-2 nucleic acids MAY BE PRESENT.   A presumptive positive result was obtained on the submitted specimen  and confirmed on repeat testing.  While 2019 novel coronavirus  (SARS-CoV-2)  nucleic acids may be present in the submitted sample  additional confirmatory testing may be necessary for epidemiological  and / or clinical management purposes  to differentiate between  SARS-CoV-2 and other Sarbecovirus currently known to infect humans.  If clinically indicated additional testing with an alternate test  methodology [phone]) is advised. The SARS-CoV-2 RNA is generally  detectable in upper and lower respiratory sp ecimens during the acute  phase of infection. The expected result is Negative. Fact Sheet for Patients:  StrictlyIdeas.no Fact Sheet for Healthcare Providers: BankingDealers.co.za This test is not yet approved or cleared by the [place] FDA and has been authorized for detection and/or  diagnosis of SARS-CoV-2 by FDA under an Emergency Use Authorization (EUA).  This EUA will remain in effect (meaning this test can be used) for the duration of the COVID-19 declaration under Section 564(b)(1) of the Act, 21 U.S.C. section 360bbb-3(b)(1), unless the authorization is terminated or revoked sooner. Performed at [facility], [place] [address], [place], [place] [address]          Radiology Studies: Ct Angio Abd/pel W/ And/or W/o  Result Date: 02/14/2019 CLINICAL DATA:  83 year old female with GI bleed. Concern for acute diverticulitis or diverticular bleed. EXAM: CTA ABDOMEN AND PELVIS wITHOUT AND WITH CONTRAST TECHNIQUE: Multidetector CT imaging of the abdomen and pelvis was performed using the standard protocol during bolus administration of intravenous contrast. Multiplanar reconstructed images and MIPs were obtained and reviewed to evaluate the vascular anatomy. CONTRAST:  145mL OMNIPAQUE IOHEXOL 350 MG/ML SOLN COMPARISON:  CT of the abdomen pelvis dated 04/14/2018 FINDINGS: VASCULAR Aorta: Mild atherosclerotic calcification. No aneurysmal dilatation or dissection. Celiac: Patent without evidence of aneurysm, dissection, vasculitis or significant stenosis. SMA: Patent without evidence of aneurysm, dissection, vasculitis or significant stenosis. Renals: Both renal arteries are patent without evidence of aneurysm, dissection, vasculitis, fibromuscular dysplasia or significant stenosis. IMA: Patent without evidence of aneurysm, dissection, vasculitis or significant stenosis. Inflow: Mild atherosclerotic calcification. No aneurysmal dilatation or dissection. The iliac arteries are widely patent. Proximal Outflow: Bilateral common femoral and visualized portions of the superficial and profunda femoral arteries are patent without evidence of aneurysm, dissection, vasculitis or significant stenosis. Veins: The IVC, iliac veins are patent. No portal venous gas. The SMV, splenic vein, and  main portal vein are patent. Review of the MIP images confirms the above findings. NON-VASCULAR Lower chest: Left lung base linear atelectasis/scarring. The visualized lung bases are otherwise clear. Coronary vascular calcification. No intra-abdominal free air or free fluid. Hepatobiliary: The liver is unremarkable. No intrahepatic biliary ductal dilatation. Layering sludge or small stones noted within the gallbladder. No pericholecystic fluid. Pancreas: The pancreas is somewhat atrophic with areas of dilatation of the main pancreatic duct. There is a coarse calcification in the uncinate process of the pancreas. These findings are similar to the prior CT and may be sequela of chronic and recurrent pancreatitis. MRI may provide better evaluation of the pancreas if clinically indicated. Spleen: Normal in size without focal abnormality. Adrenals/Urinary Tract: The adrenal glands are unremarkable. Bilateral renal parapelvic cysts. There is no hydronephrosis or nephrolithiasis on either side. The urinary bladder is distended and grossly unremarkable. Stomach/Bowel: There is colonic diverticulosis the without definite active inflammatory changes. No active diverticular bleed identified. There is no bowel obstruction. No active inflammatory changes. The appendix is not visualized with certainty. No inflammatory changes identified in the right lower quadrant. Lymphatic: Small retroperitoneal and mesenteric lymph nodes, of indeterminate etiology, possibly reactive. Reproductive: Hysterectomy. No pelvic mass.  Other: There is a 1 cm nodular density in the inferior left breast. Clinical correlation is recommended. Musculoskeletal: Osteopenia with degenerative changes of the spine. No acute osseous pathology. IMPRESSION: 1. No acute intra-abdominal or pelvic pathology. No CT evidence of bowel ischemia or active diverticular bleed. 2. Colonic diverticulosis. No bowel obstruction or active inflammation. 3. Atrophic pancreas with  areas of dilatation of the main pancreatic duct similar to prior CT and may be sequela of chronic and recurrent pancreatitis. MRI may provide better evaluation of the pancreas if clinically indicated. 4. A 1 cm nodular density in the inferior left breast. Clinical correlation is recommended. Electronically Signed   By: Anner Crete M.D.   On: 02/14/2019 01:26        Scheduled Meds:  sodium chloride   Intravenous Once   famotidine  20 mg Oral Daily   Continuous Infusions:   LOS: 3 days     Cordelia Poche, MD [facility] 02/15/2019, 1:18 PM  If 7PM-7AM, please contact night-coverage [email or web address]

## 2019-02-15 NOTE — Plan of Care (Signed)
  Problem: Education: Goal: Knowledge of General Education information will improve Description: Including pain rating scale, medication(s)/side effects and non-pharmacologic comfort measures Outcome: Progressing   Problem: Health Behavior/Discharge Planning: Goal: Ability to manage health-related needs will improve Outcome: Progressing   Problem: Clinical Measurements: Goal: Ability to maintain clinical measurements within normal limits will improve Outcome: Progressing  Goal: Diagnostic test results will improve Outcome: Progressing Colonoscopy showed no active bleeding at this time. No new orders will need to follow up if bleeding reoccurs. Goal: Cardiovascular complication will be avoided Outcome: Progressing Remains in AFIB-NSR. SBP remains <175 will continue to monitor.   Problem: Coping: Goal: Level of anxiety will decrease Outcome: Progressing Anxious to discharge home, updated on plan to be monitored overnight and discharge 8/12.

## 2019-02-15 NOTE — Anesthesia Preprocedure Evaluation (Addendum)
Anesthesia Evaluation  Patient identified by MRN, date of birth, ID band Patient awake    Reviewed: Allergy & Precautions, NPO status , Patient's Chart, lab work & pertinent test results  Airway Mallampati: II  TM Distance: >3 FB Neck ROM: Full    Dental  (+) Edentulous Upper, Edentulous Lower, Dental Advisory Given   Pulmonary neg pulmonary ROS,    Pulmonary exam normal breath sounds clear to auscultation       Cardiovascular hypertension, Pt. on medications Normal cardiovascular exam Rhythm:Regular Rate:Normal     Neuro/Psych negative neurological ROS  negative psych ROS   GI/Hepatic Neg liver ROS, GERD  ,  Endo/Other  negative endocrine ROS  Renal/GU negative Renal ROS  negative genitourinary   Musculoskeletal negative musculoskeletal ROS (+)   Abdominal   Peds negative pediatric ROS (+)  Hematology  (+) anemia ,   Anesthesia Other Findings   Reproductive/Obstetrics negative OB ROS                            Anesthesia Physical  Anesthesia Plan  ASA: II  Anesthesia Plan: MAC   Post-op Pain Management:    Induction:   PONV Risk Score and Plan: 2 and Treatment may vary due to age or medical condition  Airway Management Planned: Nasal Cannula  Additional Equipment:   Intra-op Plan:   Post-operative Plan:   Informed Consent: I have reviewed the patients History and Physical, chart, labs and discussed the procedure including the risks, benefits and alternatives for the proposed anesthesia with the patient or authorized representative who has indicated his/her understanding and acceptance.     Dental advisory given  Plan Discussed with: CRNA  Anesthesia Plan Comments:         Anesthesia Quick Evaluation

## 2019-02-16 DIAGNOSIS — K5791 Diverticulosis of intestine, part unspecified, without perforation or abscess with bleeding: Secondary | ICD-10-CM

## 2019-02-16 DIAGNOSIS — I491 Atrial premature depolarization: Secondary | ICD-10-CM

## 2019-02-16 LAB — COMPREHENSIVE METABOLIC PANEL
ALT: 14 U/L (ref 0–44)
AST: 20 U/L (ref 15–41)
Albumin: 3 g/dL — ABNORMAL LOW (ref 3.5–5.0)
Alkaline Phosphatase: 56 U/L (ref 38–126)
Anion gap: 8 (ref 5–15)
BUN: <5 mg/dL — ABNORMAL LOW (ref 8–23)
CO2: 27 mmol/L (ref 22–32)
Calcium: 9.8 mg/dL (ref 8.9–10.3)
Chloride: 105 mmol/L (ref 98–111)
Creatinine, Ser: 0.83 mg/dL (ref 0.44–1.00)
GFR calc Af Amer: >60 mL/min (ref >60)
GFR calc non Af Amer: >60 mL/min (ref >60)
Glucose, Bld: 155 mg/dL — ABNORMAL HIGH (ref 70–99)
Potassium: 2.7 mmol/L — CL (ref 3.5–5.1)
Sodium: 140 mmol/L (ref 135–145)
Total Bilirubin: 0.6 mg/dL (ref 0.3–1.2)
Total Protein: 4.9 g/dL — ABNORMAL LOW (ref 6.5–8.1)

## 2019-02-16 LAB — CBC WITH DIFFERENTIAL/PLATELET
Abs Immature Granulocytes: 0.01 10*3/uL (ref 0.00–0.07)
Basophils Absolute: 0 10*3/uL (ref 0.0–0.1)
Basophils Relative: 0 %
Eosinophils Absolute: 0.1 10*3/uL (ref 0.0–0.5)
Eosinophils Relative: 1 %
HCT: 29.7 % — ABNORMAL LOW (ref 36.0–46.0)
Hemoglobin: 10.3 g/dL — ABNORMAL LOW (ref 12.0–15.0)
Immature Granulocytes: 0 %
Lymphocytes Relative: 21 %
Lymphs Abs: 1.6 10*3/uL (ref 0.7–4.0)
MCH: 28.5 pg (ref 26.0–34.0)
MCHC: 34.7 g/dL (ref 30.0–36.0)
MCV: 82.3 fL (ref 80.0–100.0)
Monocytes Absolute: 0.5 10*3/uL (ref 0.1–1.0)
Monocytes Relative: 6 %
Neutro Abs: 5.5 10*3/uL (ref 1.7–7.7)
Neutrophils Relative %: 72 %
Platelets: 183 10*3/uL (ref 150–400)
RBC: 3.61 MIL/uL — ABNORMAL LOW (ref 3.87–5.11)
RDW: 19.1 % — ABNORMAL HIGH (ref 11.5–15.5)
WBC: 7.7 10*3/uL (ref 4.0–10.5)
nRBC: 0 % (ref 0.0–0.2)

## 2019-02-16 LAB — GLUCOSE, CAPILLARY: Glucose-Capillary: 109 mg/dL — ABNORMAL HIGH (ref 70–99)

## 2019-02-16 LAB — PHOSPHORUS: Phosphorus: 2.9 mg/dL (ref 2.5–4.6)

## 2019-02-16 LAB — MAGNESIUM: Magnesium: 1.6 mg/dL — ABNORMAL LOW (ref 1.7–2.4)

## 2019-02-16 MED ORDER — ADULT MULTIVITAMIN W/MINERALS CH
1.0000 | ORAL_TABLET | Freq: Every day | ORAL | Status: DC
  Administered 2019-02-16 – 2019-02-17 (×2): 1 via ORAL
  Filled 2019-02-16 (×2): qty 1

## 2019-02-16 MED ORDER — POTASSIUM CHLORIDE CRYS ER 20 MEQ PO TBCR
40.0000 meq | EXTENDED_RELEASE_TABLET | Freq: Two times a day (BID) | ORAL | Status: AC
  Administered 2019-02-16 (×2): 40 meq via ORAL
  Filled 2019-02-16 (×2): qty 2

## 2019-02-16 MED ORDER — POTASSIUM CHLORIDE 10 MEQ/100ML IV SOLN
10.0000 meq | INTRAVENOUS | Status: AC
  Administered 2019-02-16 (×4): 10 meq via INTRAVENOUS
  Filled 2019-02-16 (×4): qty 100

## 2019-02-16 MED ORDER — MAGNESIUM SULFATE 2 GM/50ML IV SOLN
2.0000 g | Freq: Once | INTRAVENOUS | Status: AC
  Administered 2019-02-16: 2 g via INTRAVENOUS
  Filled 2019-02-16: qty 50

## 2019-02-16 MED ORDER — ENSURE ENLIVE PO LIQD
237.0000 mL | Freq: Two times a day (BID) | ORAL | Status: DC
  Administered 2019-02-16 – 2019-02-17 (×2): 237 mL via ORAL

## 2019-02-16 NOTE — Progress Notes (Signed)
PROGRESS NOTE    Latoya Bennett  ATF:[id number] DOB: Apr 18, 1936 DOA: 02/11/2019 PCP: Horald Pollen, MD   Brief Narrative:  The Patient is an 83 year old African-American female with a past medical history significant for but not limited to diverticulosis, history of GI bleeding, hypertension, GERD, gout, history of PACs as well as other comorbidities who presented to the hospital secondary to hematochezia in the setting of diverticulosis.  GI was consulted and initial plan was for observation and discharge however.  The patient had significant pain with profound hematochezia she subsequently had symptomatic anemia and syncope and now the plan was for diagnostic colonoscopy.  She underwent colonoscopy and was found to have diverticulosis in the entire examined colon as well as blood in the rectum and the rectosigmoid colon and in the sigmoid colon and the distal stool distending colon.  There were two 2 to 5 mm sessile polyps that were not resected given the obscure GI bleed not to complicate the course with a possible post polypectomy bleed.  The terminal ileum appeared normal.  The patient was returned to the medical floor and was kept on a liquid diet and diet was advanced today.  She continues to have some significant electrolyte abnormalities and will continue to watch overnight for any further GI bleeding has occurred she has not had any further stools or bleeding since colonoscopy prep.  Will observe overnight and discharge home in the a.m. if potassium has significantly improved with magnesium.  Patient wants a second opinion and will need to follow-up with Dr. Lurena Nida as an outpatient.  Currently for her atrophic pancreas and pancreatic duct dilatation seen on CT NGO is likely sequela of chronic pancreatitis and GIs deferring MRI to be done in outpatient setting.  Assessment & Plan:   Principal Problem:   Diverticulosis of colon with hemorrhage Active Problems:   PAC (premature  atrial contraction)   Essential hypertension   GI bleed   Hypokalemia   GERD (gastroesophageal reflux disease)   Symptomatic anemia   GIB (gastrointestinal bleeding)   Acute posthemorrhagic anemia  Lower GI Bleeding with Concern for Diverticular Bleed -Patient with recurrent hematochezia.   -She has received 5 units of PRBC to date.   -History of diverticulosis with confirmed diagnosis on CT angiogram of the abdomen and pelvis.   -Overnight on 8/10, patient had significant pain with profound hematochezia and associated severe blood loss anemia requiring 2 units of PRBC. -Colonoscopy performed and significant for blood throughout sigmoid and distal descending colon with no source of bleeding but did have two sessile polyps that were not removed to prevent from complicating the obscure GIB -Patient's hemoglobin/hematocrit went from 6.2/18.1 is now 10.3/29.7 -Diet has been advanced and will continue monitor diet tolerance and make sure the patient does not have recurrent GI bleeding overnight prior to discharge and will continue to replete electrolytes. -Patient was unhappy with the GI doctor that saw her and wants a second opinion with Dr. Wilford Corner in outpatient setting  Symptomatic Normocytic Anemia GI bleeding -Secondary to above. Management above. -Hemoglobin/hematocrit is now stable at 10.3/29.7 Effective monitor for signs and symptoms of bleeding; currently no further overt bleeding noted -Repeat CBC in the a.m. and anticipate discharge home if electrolytes are replete  Essential Hypertension -Patient is on amlodipine as an outpatient. -Currently Holding BP meds secondary to bleeding -Hydralazine IV 5 mg q2hprn for SBP>175   GERD -Continue Famotidine 20mg  po Daily   Hypokalemia -Patient's potassium this morning was 2.7 -We will replete  with IV KCl at 40 mEq along with p.o. KCl 40 mg twice daily x2 doses -Magnesium level was low so also replete that -Continue to  monitor and replete as necessary -Repeat CMP in a.m.  Hypomagnesemia -Patient's magnesium level was 1.6 this morning -Replete with IV mag sulfate 2 g -Continue to monitor and replete as necessary -Repeat magnesium level in the a.m.  Hyperglycemia -Patient's sugar on BMP/CMP is ranging from 110-167 -Continue to monitor and trend -Check hemoglobin A1c in a.m. -If consistently elevated will need to place on a sensitive and alongside scale insulin before meals and at bedtime -CBGs have been ranging from 106-109  Thrombocytopenia -Improving The patient's platelet count dropped all the way down to 122,000 and is now 183,000 -Continue monitor for signs or symptoms of bleeding -Repeat CBC in the a.m.  Left Breast Nodular Density -CTA showed a 1 cm nodular density in the inferior left breast -Will need outpatient follow-up and management; Will need a Diagnostic Mammogram and will need PCP referral for this   Atrophic pancreas with areas of dilatation of the main pancreatic duct with questionable sequela secondary to chronic recurrent pancreatitis -GI recommending an MRI but will defer to outpatient to obtain specifically to Dr. Wilford Corner as the patient wants to obtain a second opinion  DVT prophylaxis: SCDs Code Status: FULL CODE Family Communication: No family present at bedside  Disposition Plan: Home at D/C in the next 24-48 hours   Consultants:   Gastroenterology Dr. Bryan Lemma    Procedures:  EGD Findings:      The perianal and digital rectal examinations were normal.      Many small and large-mouthed diverticula were found in the entire colon.       Each of the diverticula were lavaged using copious amounts of sterile       water, resulting in clearance with good visualization. No active       bleeding nor stigmata of recent site of bleeding noted.      Clotted blood and blood tinged fluid was found in the rectum, in the       recto-sigmoid colon, in the sigmoid  colon and in the distal descending       colon. The colon lumen was aggressively irrigated with copious amounts       of sterile water, resulting in clearance with good visualization. The       blood tinged fluid resolved and most clots were removed as well,       resulting in good visualization. No active bleeding noted. No return of       blood tinged fluid. The colonsocope was then re-advanced to the cecum       and again withdran slowly for a 2nd look thoughout the colon. No       bleeding noted, and no return of blood tinged fluid. No high grade       stigmata of bleeding seen.      The retroflexed view of the distal rectum and anal verge was normal and       showed no anal or rectal abnormalities.      Two sessile polyps were found in the transverse colon and ascending       colon. The polyps were 2 to 5 mm in size. Given the obscure GI bleed,       these were not resected so to not complicate the course with a possible       post polypectomy bleed.  The terminal ileum appeared normal. Impression:               - Diverticulosis in the entire examined colon.                           - Blood in the rectum, in the recto-sigmoid colon,                            in the sigmoid colon and in the distal descending                            colon.                           - The distal rectum and anal verge are normal on                            retroflexion view.                           - Two 2 to 5 mm polyps in the transverse colon and                            in the ascending colon.                           - The examined portion of the ileum was normal.  Antimicrobials:  Anti-infectives (From admission, onward)   None     Subjective: And examined at bedside was frustrated with the GI team.  Denies any chest pain, lightheadedness or dizziness.  No further bleeding noted.  No nausea or vomiting.  No lightheadedness or dizziness.  No other concerns or complaints at this  time  Objective: Vitals:   02/16/19 0052 02/16/19 0435 02/16/19 0803 02/16/19 1150  BP: 101/61 127/66    Pulse: 80 71    Resp: 18     Temp: (!) 97.4 F (36.3 C) 98.4 F (36.9 C) 98.1 F (36.7 C) 97.6 F (36.4 C)  TempSrc: Oral Oral Oral Oral  SpO2: 100% 100%    Weight:      Height:        Intake/Output Summary (Last 24 hours) at 02/16/2019 1357 Last data filed at 02/15/2019 1700 Gross per 24 hour  Intake 280 ml  Output --  Net 280 ml   Filed Weights   02/11/19 1541  Weight: 56.2 kg   Examination: Physical Exam:  Constitutional: WN/WD elderly African-American female in NAD and appears calm Eyes: Lids and conjunctivae normal, sclerae anicteric  ENMT: External Ears, Nose appear normal. Grossly normal hearing. Mucous membranes are moist.  Neck: Appears normal, supple, no cervical masses, normal ROM, no appreciable thyromegaly; no JVD Respiratory: Diminished to auscultation bilaterally, no wheezing, rales, rhonchi or crackles. Normal respiratory effort and patient is not tachypenic. No accessory muscle use.  Cardiovascular: RRR, no murmurs / rubs / gallops. S1 and S2 auscultated. No appreciable extremity edema. Abdomen: Soft, non-tender; ND. Bowel sounds positive x4.  GU: Deferred. Musculoskeletal: No clubbing / cyanosis of digits/nails. No joint deformity upper and lower extremities.  Skin: No rashes, lesions, ulcers on a limited skin evaluation. No  induration; Warm and dry.  Neurologic: CN 2-12 grossly intact with no focal deficits. Romberg sign and cerebellar reflexes not assessed.  Psychiatric: Normal judgment and insight. Alert and oriented x 3. Agitated and frustrated mood and appropriate affect.   Data Reviewed: I have personally reviewed following labs and imaging studies  CBC: Recent Labs  Lab 02/13/19 0509  02/13/19 2045 02/13/19 2310 02/14/19 0817 02/14/19 2218 02/16/19 0942  WBC 4.4  --   --  6.8 7.1 5.5 7.7  NEUTROABS  --   --   --   --   --   --  5.5   HGB 7.6*   < > 7.6* 6.2* 10.2* 9.0* 10.3*  HCT 22.1*   < > 21.9* 18.1* 28.6* 25.4* 29.7*  MCV 75.7*  --   --  77.0* 82.2 81.9 82.3  PLT 134*  --   --  130* 122* 141* 183   < > = values in this interval not displayed.   Basic Metabolic Panel: Recent Labs  Lab 02/11/19 1545 02/12/19 0700 02/13/19 2310 02/14/19 2218 02/16/19 0942  NA 138 140 138 139 140  K 3.4* 3.2* 3.1* 3.2* 2.7*  CL 106 107 106 107 105  CO2 25 27 25 26 27   GLUCOSE 175* 108* 167* 110* 155*  BUN 17 16 13  7* <5*  CREATININE 0.90 0.86 0.94 0.68 0.83  CALCIUM 9.5 9.3 8.7* 8.7* 9.8  MG  --  1.9  --   --  1.6*  PHOS  --   --   --   --  2.9   GFR: Estimated Creatinine Clearance: 45.9 mL/min (by C-G formula based on SCr of 0.83 mg/dL). Liver Function Tests: Recent Labs  Lab 02/11/19 1545 02/16/19 0942  AST 17 20  ALT 15 14  ALKPHOS 65 56  BILITOT 0.6 0.6  PROT 5.5* 4.9*  ALBUMIN 3.3* 3.0*   No results for input(s): LIPASE, AMYLASE in the last 168 hours. No results for input(s): AMMONIA in the last 168 hours. Coagulation Profile: Recent Labs  Lab 02/12/19 0700  INR 1.2   Cardiac Enzymes: No results for input(s): CKTOTAL, CKMB, CKMBINDEX, TROPONINI in the last 168 hours. BNP (last 3 results) No results for input(s): PROBNP in the last 8760 hours. HbA1C: No results for input(s): HGBA1C in the last 72 hours. CBG: Recent Labs  Lab 02/12/19 0806 02/13/19 0752 02/13/19 2306 02/14/19 0744 02/16/19 0802  GLUCAP 90 106* 146* 106* 109*   Lipid Profile: No results for input(s): CHOL, HDL, LDLCALC, TRIG, CHOLHDL, LDLDIRECT in the last 72 hours. Thyroid Function Tests: No results for input(s): TSH, T4TOTAL, FREET4, T3FREE, THYROIDAB in the last 72 hours. Anemia Panel: No results for input(s): VITAMINB12, FOLATE, FERRITIN, TIBC, IRON, RETICCTPCT in the last 72 hours. Sepsis Labs: Recent Labs  Lab 02/13/19 2310 02/14/19 0817  LATICACIDVEN 2.1* 1.2    Recent Results (from the past 240 hour(s))   SARS Coronavirus 2 [facility] order, Performed in [facility] hospital lab) Nasopharyngeal Nasopharyngeal Swab     Status: None   Collection Time: 02/12/19 12:54 AM   Specimen: Nasopharyngeal Swab  Result Value Ref Range Status   SARS Coronavirus 2 NEGATIVE NEGATIVE Final    Comment: (NOTE) If result is NEGATIVE SARS-CoV-2 target nucleic acids are NOT DETECTED. The SARS-CoV-2 RNA is generally detectable in upper and lower  respiratory specimens during the acute phase of infection. The lowest  concentration of SARS-CoV-2 viral copies this assay can detect is 250  copies / mL. A negative  result does not preclude SARS-CoV-2 infection  and should not be used as the sole basis for treatment or other  patient management decisions.  A negative result may occur with  improper specimen collection / handling, submission of specimen other  than nasopharyngeal swab, presence of viral mutation(s) within the  areas targeted by this assay, and inadequate number of viral copies  (<250 copies / mL). A negative result must be combined with clinical  observations, patient history, and epidemiological information. If result is POSITIVE SARS-CoV-2 target nucleic acids are DETECTED. The SARS-CoV-2 RNA is generally detectable in upper and lower  respiratory specimens dur ing the acute phase of infection.  Positive  results are indicative of active infection with SARS-CoV-2.  Clinical  correlation with patient history and other diagnostic information is  necessary to determine patient infection status.  Positive results do  not rule out bacterial infection or co-infection with other viruses. If result is PRESUMPTIVE POSTIVE SARS-CoV-2 nucleic acids MAY BE PRESENT.   A presumptive positive result was obtained on the submitted specimen  and confirmed on repeat testing.  While 2019 novel coronavirus  (SARS-CoV-2) nucleic acids may be present in the submitted sample  additional confirmatory testing may be  necessary for epidemiological  and / or clinical management purposes  to differentiate between  SARS-CoV-2 and other Sarbecovirus currently known to infect humans.  If clinically indicated additional testing with an alternate test  methodology [id number]) is advised. The SARS-CoV-2 RNA is generally  detectable in upper and lower respiratory sp ecimens during the acute  phase of infection. The expected result is Negative. Fact Sheet for Patients:  StrictlyIdeas.no Fact Sheet for Healthcare Providers: BankingDealers.co.za This test is not yet approved or cleared by the [place] FDA and has been authorized for detection and/or diagnosis of SARS-CoV-2 by FDA under an Emergency Use Authorization (EUA).  This EUA will remain in effect (meaning this test can be used) for the duration of the COVID-19 declaration under Section 564(b)(1) of the Act, 21 U.S.C. section 360bbb-3(b)(1), unless the authorization is terminated or revoked sooner. Performed at [facility], [place] [address] [place], [place] [address]     Radiology Studies: No results found.  Scheduled Meds:  sodium chloride   Intravenous Once   famotidine  20 mg Oral Daily   feeding supplement (ENSURE ENLIVE)  237 mL Oral BID BM   multivitamin with minerals  1 tablet Oral Daily   potassium chloride  40 mEq Oral BID   Continuous Infusions:  potassium chloride 10 mEq (02/16/19 1322)    LOS: 4 days   Kerney Elbe, DO [facility] PAGER is on [place]  If 7PM-7AM, please contact night-coverage [email or web address] Password [facility] 02/16/2019, 1:57 PM

## 2019-02-16 NOTE — Progress Notes (Signed)
Daily Rounding Note  02/16/2019, 10:24 AM  LOS: 4 days   SUBJECTIVE:   Chief complaint: Diverticular hemorrhage.  Blood loss anemia. No further stools or bleeding since the colonoscopy prep.  Patient feels well. However she is very frustrated as she wants to go home.  Friends of her have been telling her to seek out the opinion of Dr. Wilford Corner, [facility].  She feels like he may be able to fix her problems.  Explained to her the nature of diverticular bleeding and that what her friends may be thinking was managed with medication was diverticulitis; not clear she was really listening or understood. Patient does not feel weak or dizzy.  Tolerating liquid diet.  OBJECTIVE:         Vital signs in last 24 hours:    Temp:  [97.4 F (36.3 C)-98.4 F (36.9 C)] 98.1 F (36.7 C) (08/12 0803) Pulse Rate:  [63-95] 71 (08/12 0435) Resp:  [16-24] 18 (08/12 0052) BP: (101-165)/(47-84) 127/66 (08/12 0435) SpO2:  [100 %] 100 % (08/12 0435) Last BM Date: 02/15/19 Filed Weights   02/11/19 1541  Weight: 56.2 kg   General: Looks well.  A bit anxious Heart: RRR. Chest: Clear bilaterally. Abdomen: Soft.  Not tender or distended.  Subcutaneous ridge consistent with scar tissue just beneath low midline scar. Extremities: No CCE. Neuro/Psych: Oriented x3.  Alert.  No obvious deficits.  Intake/Output from previous day: 08/11 0701 - 08/12 0700 In: 680 [P.O.:280; I.V.:400] Out: -   Intake/Output this shift: No intake/output data recorded.  Lab Results: Recent Labs    02/14/19 0817 02/14/19 2218 02/16/19 0942  WBC 7.1 5.5 7.7  HGB 10.2* 9.0* 10.3*  HCT 28.6* 25.4* 29.7*  PLT 122* 141* 183   BMET Recent Labs    02/13/19 2310 02/14/19 2218 02/16/19 0942  NA 138 139 140  K 3.1* 3.2* 2.7*  CL 106 107 105  CO2 25 26 27   GLUCOSE 167* 110* 155*  BUN 13 7* <5*  CREATININE 0.94 0.68 0.83  CALCIUM 8.7* 8.7* 9.8   LFT  Recent Labs    02/16/19 0942  PROT 4.9*  ALBUMIN 3.0*  AST 20  ALT 14  ALKPHOS 56  BILITOT 0.6   Scheduled Meds: . sodium chloride   Intravenous Once  . famotidine  20 mg Oral Daily   Continuous Infusions: PRN Meds:.acetaminophen **OR** acetaminophen, hydrALAZINE, ondansetron **OR** ondansetron (ZOFRAN) IV   ASSESMENT:   *    Diverticular bleed, painless hematochezia.  Stuttering. 8/10 CTAP Angio: No active bleeding. 02/15/2019 colonoscopy: Pandiverticulosis.  Blood present in the rectum, rectosigmoid, sigmoid and distal descending colon. Two, 2 to 5 mm, polyps in the transverse and a sending colon, not removed.  *    Blood loss anemia.  Hgb 6.2 >> 3 PRBCs >> 10.3.   Anemia profile rules out iron deficiency.  *    Hypokalemia, 2.7.  *   1 cm left breast nodule.  Assume this needs further work-up.  Last mammogram (normal) in epic was in 2011.  *   Atrophic pancreas and areas of pancreatic duct dilatation seen on CT angio.  ?  Sequela of recurrent, chronic pancreatitis.  MRI with the test of choice for further evaluation. Pt has no hx of pancreatitis.     PLAN   *   Defer correction of potassium, decision regarding work-up of breast nodule to the hospitalist.  *     Pt wants  to get 2nd opinion with Dr Wilford Corner as outpt.  This is fine.  Hence will not arrange GI fup with Dr Ardis Hughs or Bryan Lemma I will leave it to either the hospitalist or the patient herself to arrange for follow-up. When she sees Dr. Michail Sermon, he can decide on MRI.    *   Patient okay to discharge.  I advanced her diet.    Latoya Bennett  02/16/2019, 10:24 AM Phone [phone]

## 2019-02-16 NOTE — Progress Notes (Signed)
CRITICAL VALUE ALERT  Critical Value:  K+ 2.7  Date & Time Notied:  02/16/19 at 1020  Provider Notified:  Claybon Jabs.  Orders Received/Actions taken:  Pending

## 2019-02-16 NOTE — Progress Notes (Signed)
Initial Nutrition Assessment  RD working remotely.  DOCUMENTATION CODES:   Not applicable  INTERVENTION:   -MVI with minerals daily -Ensure Enlive po BID, each supplement provides 350 kcal and 20 grams of protein  NUTRITION DIAGNOSIS:   Inadequate oral intake related to altered GI function, decreased appetite as evidenced by meal completion < 50%, per patient/family report.  GOAL:   Patient will meet greater than or equal to 90% of their needs  MONITOR:   PO intake, Supplement acceptance, Labs, Weight trends, Skin, I & O's  REASON FOR ASSESSMENT:   Consult Assessment of nutrition requirement/status  ASSESSMENT:   Latoya Bennett is a 83 y.o. female with medical history significant of diverticulosis, GI bleeding, hypertension, GERD, gout, PAC, who presents with bloody stool.  Pt admitted with diverticular bleed.   8/9- discharge cancelled due to red bowel movement 8/11- s/p colonoscopy revealed pandiverticulosis  Reviewed I/O's: +680 ml x 24 hours and +633 ml since admission  Spoke with pt on phone, who was pleasant and in good spirits today. She reports feeling better over the past few days. She has been tolerating full liquid diet without difficulty, however, does not enjoy limited options and is looking forward to eating solid foods. Pt reports she ate a few bites at breakfast today. Recorded meal completion per doc flowsheets 0%.   PTA, pt reports she generally has a good appetite and consumes 3 meals per day. She reports a decreased appetite for 1-2 days PTA when she noticed blood in her stool. Pt reports she will occasionally drink Ensure supplements, however, increased frequency to 1-2 per day when intake decreased due to diverticular bleed.   Pt expressed concern over weight loss. Per her reports her UBW is 148#, which she last weighed approximately 6 months ago. Per her report, this is her 3rd admission in the past 6 months for similar symptoms and has  progressively lost weight during each hospitalization. She states "I'm down to 124#; I've lost about 50 pounds and I'm all flabby". Reviewed wt hx, which is not consistent with pt report, however, reveals a slow gradual wt loss over the past 2 years.   Discussed importance of good meal and supplement intake to promote healing. Pt amenable to continue Ensure supplements during hospitalization and at home.   Medications reviewed and include IV magnesium sulfate, IV KCl, and K-Dur.   Labs reviewed: Phos WDL. Mg: 1.6 (on IV replacement), K: 2.7 (on IV and PO replacement). CBGS: 109  Diet Order:   Diet Order            Diet regular Room service appropriate? Yes; Fluid consistency: Thin  Diet effective now              EDUCATION NEEDS:   Education needs have been addressed  Skin:  Skin Assessment: Reviewed RN Assessment  Last BM:  02/15/19  Height:   Ht Readings from Last 1 Encounters:  02/11/19 5\' 4"  (1.626 m)    Weight:   Wt Readings from Last 1 Encounters:  02/11/19 56.2 kg    Ideal Body Weight:  54.5 kg  BMI:  Body mass index is 21.28 kg/m.  Estimated Nutritional Needs:   Kcal:  1400-1600  Protein:  60-75 grams  Fluid:  > 1.4 L    Latoya Bennett A. Latoya Bennett, RD, LDN, [place] Registered Dietitian II Certified Diabetes Care and Education Specialist Pager: [phone] After hours Pager: [phone]

## 2019-02-17 LAB — CBC WITH DIFFERENTIAL/PLATELET
Abs Immature Granulocytes: 0.02 10*3/uL (ref 0.00–0.07)
Basophils Absolute: 0 10*3/uL (ref 0.0–0.1)
Basophils Relative: 0 %
Eosinophils Absolute: 0.1 10*3/uL (ref 0.0–0.5)
Eosinophils Relative: 2 %
HCT: 26.3 % — ABNORMAL LOW (ref 36.0–46.0)
Hemoglobin: 9.1 g/dL — ABNORMAL LOW (ref 12.0–15.0)
Immature Granulocytes: 0 %
Lymphocytes Relative: 25 %
Lymphs Abs: 1.4 10*3/uL (ref 0.7–4.0)
MCH: 29.1 pg (ref 26.0–34.0)
MCHC: 34.6 g/dL (ref 30.0–36.0)
MCV: 84 fL (ref 80.0–100.0)
Monocytes Absolute: 0.5 10*3/uL (ref 0.1–1.0)
Monocytes Relative: 9 %
Neutro Abs: 3.6 10*3/uL (ref 1.7–7.7)
Neutrophils Relative %: 64 %
Platelets: 174 10*3/uL (ref 150–400)
RBC: 3.13 MIL/uL — ABNORMAL LOW (ref 3.87–5.11)
RDW: 19.9 % — ABNORMAL HIGH (ref 11.5–15.5)
WBC: 5.8 10*3/uL (ref 4.0–10.5)
nRBC: 0 % (ref 0.0–0.2)

## 2019-02-17 LAB — COMPREHENSIVE METABOLIC PANEL
ALT: 12 U/L (ref 0–44)
AST: 13 U/L — ABNORMAL LOW (ref 15–41)
Albumin: 2.6 g/dL — ABNORMAL LOW (ref 3.5–5.0)
Alkaline Phosphatase: 54 U/L (ref 38–126)
Anion gap: 7 (ref 5–15)
BUN: 8 mg/dL (ref 8–23)
CO2: 26 mmol/L (ref 22–32)
Calcium: 9.8 mg/dL (ref 8.9–10.3)
Chloride: 105 mmol/L (ref 98–111)
Creatinine, Ser: 1.01 mg/dL — ABNORMAL HIGH (ref 0.44–1.00)
GFR calc Af Amer: >60 mL/min (ref >60)
GFR calc non Af Amer: 52 mL/min — ABNORMAL LOW (ref >60)
Glucose, Bld: 127 mg/dL — ABNORMAL HIGH (ref 70–99)
Potassium: 4.2 mmol/L (ref 3.5–5.1)
Sodium: 138 mmol/L (ref 135–145)
Total Bilirubin: 0.4 mg/dL (ref 0.3–1.2)
Total Protein: 4.6 g/dL — ABNORMAL LOW (ref 6.5–8.1)

## 2019-02-17 LAB — PHOSPHORUS: Phosphorus: 3.3 mg/dL (ref 2.5–4.6)

## 2019-02-17 LAB — MAGNESIUM: Magnesium: 2.1 mg/dL (ref 1.7–2.4)

## 2019-02-17 LAB — GLUCOSE, CAPILLARY: Glucose-Capillary: 147 mg/dL — ABNORMAL HIGH (ref 70–99)

## 2019-02-17 MED ORDER — ADULT MULTIVITAMIN W/MINERALS CH: 1.0000 | ORAL_TABLET | Freq: Every day | ORAL | 0 refills | Status: AC

## 2019-02-17 MED ORDER — ENSURE ENLIVE PO LIQD: 237.0000 mL | Freq: Two times a day (BID) | ORAL | 12 refills | Status: AC

## 2019-02-17 MED ORDER — FAMOTIDINE 20 MG PO TABS: 20.0000 mg | ORAL_TABLET | Freq: Every day | ORAL | 0 refills | Status: DC

## 2019-02-17 NOTE — Progress Notes (Signed)
Patient was discharged home by MD order; discharged instructions  review and give to patient with care notes and prescription; IV DIC; skin intact; patient will be escorted to the car by nurse tech via wheelchair.  

## 2019-02-17 NOTE — Discharge Summary (Signed)
Physician Discharge Summary  [place] BZJ:[id number] DOB: 07-11-1935 DOA: 02/11/2019  PCP: Horald Pollen, MD  Admit date: 02/11/2019 Discharge date: 02/17/2019  Admitted From: Home Disposition: Home  Recommendations for Outpatient Follow-up:  1. Follow up with PCP in 1-2 weeks 2. Follow up with Gastroenterology as an outpatient.follow-up with Dr. Michail Sermon in outpatient setting for second opinion 3. Will need outpatient diagnostic mammogram for left breast nodular density 4. Will need an MRI in the outpatient setting to be determined by gastroenterology for her atrophic pancreas 5. Please obtain CMP/CBC, Mag, Phos in one week 6. Please follow up on the following pending results:  Home Health: None Equipment/Devices: No   Discharge Condition: Stable  CODE STATUS: FULL CODE Diet recommendation: Heart Healthy  Brief/Interim Summary: The Patient is an 83 year old African-American female with a past medical history significant for but not limited to diverticulosis, history of GI bleeding, hypertension, GERD, gout, history of PACs as well as other comorbidities who presented to the hospital secondary to hematochezia in the setting of diverticulosis.  GI was consulted and initial plan was for observation and discharge however.  The patient had significant pain with profound hematochezia she subsequently had symptomatic anemia and syncope and now the plan was for diagnostic colonoscopy.  She underwent colonoscopy and was found to have diverticulosis in the entire examined colon as well as blood in the rectum and the rectosigmoid colon and in the sigmoid colon and the distal stool distending colon.  There were two 2 to 5 mm sessile polyps that were not resected given the obscure GI bleed not to complicate the course with a possible post polypectomy bleed.  The terminal ileum appeared normal.  The patient was returned to the medical floor and was kept on a liquid diet and diet was  advanced today.    She continued to have some significant electrolyte abnormalities and was watched overnight for any further GI bleeding.  Patient wants a second opinion and will need to follow-up with Dr. Wilford Corner as an outpatient and for her atrophic pancreas and pancreatic duct dilatation seen on CT likely sequela of chronic pancreatitis and [facility] deferring MRI to be done in outpatient setting by Dr. Michail Sermon.  His electrolytes were improved significantly and she is deemed stable for discharge she will need to follow-up with PCP, gastroenterology in outpatient setting and will need referral for an MRI as well as a diagnostic mammogram for her left breast nodular density.  Discharge Diagnoses:  Principal Problem:   Diverticulosis of colon with hemorrhage Active Problems:   PAC (premature atrial contraction)   Essential hypertension   GI bleed   Hypokalemia   GERD (gastroesophageal reflux disease)   Symptomatic anemia   GIB (gastrointestinal bleeding)   Acute posthemorrhagic anemia  Lower GI Bleeding with Concern for Diverticular Bleed -Patient with recurrent hematochezia.  -She has received 5units of PRBC to date.  -History of diverticulosis with confirmed diagnosis on CT angiogram of the abdomen and pelvis.  -Overnight on 8/10, patient hadsignificant pain with profound hematochezia and associated severe blood loss anemia requiring 2 units of PRBC. -Colonoscopy performed and significant for blood throughout sigmoid and distal descending colon with no source of bleeding but did have two sessile polyps that were not removed to prevent from complicating the obscure GIB -Patient's hemoglobin/hematocrit went from 6.2/18.1 is now 9.1/26.3 and dropped a little bit from yesterday likely a delusional drop but hemoglobin is now stable -Diet has been advanced and will continue monitor diet tolerance  and make sure the patient does not have recurrent GI bleeding overnight prior to  discharge and will continue to replete electrolytes. -Patient was unhappy with the GI doctor that saw her and wants a second opinion with Dr. Wilford Corner in outpatient setting and this will be arranged -We will defer to GI for polyp removal and MRI for atrophic pancreas  Symptomatic Normocytic Anemia GI bleeding -Secondary to above. Management above. -Hemoglobin/hematocrit is now stable at 9.1/26.3 Effective monitor for signs and symptoms of bleeding; currently no further overt bleeding noted -Repeat CBC in the a.m. and anticipate discharge home if electrolytes are replete  Essential Hypertension -Patient is on amlodipine as an outpatient. -Currently Holding BP meds secondary to bleeding -Hydralazine IV 5 mg q2hprn for SBP>175  -Resume amlodipine at discharge  GERD -Continue Famotidine 20mg  po Daily  at discharge  Hypokalemia -Patient's potassium yesterday morning was 2.7 and today is 4.2 -Continue to monitor and replete as necessary -Repeat CMP as an outpatient  Hypomagnesemia -Patient's magnesium level was 1.6 yesterday morning and today it is 2.1 -Continue to monitor and replete as necessary -Repeat magnesium level as an outpatient  Hyperglycemia -Patient's sugar on BMP/CMP is ranging from 110-167 -Continue to monitor and trend -Check hemoglobin A1c as an outpatient -If consistently elevated will need to place on a sensitive and alongside scale insulin before meals and at bedtime -CBGs have been ranging from 106-109  Thrombocytopenia -Improving The patient's platelet count dropped all the way down to 122,000 and is now 183,000 -Continue monitor for signs or symptoms of bleeding -Repeat CBC in the a.m.  Left Breast Nodular Density -CTA showed a 1 cm nodular density in the inferior left breast -Will need outpatient follow-up and management; Will need a Diagnostic Mammogram and will need PCP referral for this   Atrophic pancreas with areas of dilatation of  the main pancreatic duct with questionable sequela secondary to chronic recurrent pancreatitis -GI recommending an MRI to be done as an outpatient and will defer to Dr. Wilford Corner as the patient wants to obtain a second opinion  Discharge Instructions  Discharge Instructions    Call MD for:  difficulty breathing, headache or visual disturbances   Complete by: As directed    Call MD for:  extreme fatigue   Complete by: As directed    Call MD for:  extreme fatigue   Complete by: As directed    Call MD for:  hives   Complete by: As directed    Call MD for:  persistant dizziness or light-headedness   Complete by: As directed    Call MD for:  persistant dizziness or light-headedness   Complete by: As directed    Call MD for:  persistant nausea and vomiting   Complete by: As directed    Call MD for:  redness, tenderness, or signs of infection (pain, swelling, redness, odor or green/yellow discharge around incision site)   Complete by: As directed    Call MD for:  severe uncontrolled pain   Complete by: As directed    Call MD for:  temperature >100.4   Complete by: As directed    Diet - low sodium heart healthy   Complete by: As directed    Discharge instructions   Complete by: As directed    You were cared for by a hospitalist during your hospital stay. If you have any questions about your discharge medications or the care you received while you were in the hospital after you are discharged,  you can call the unit and ask to speak with the hospitalist on call if the hospitalist that took care of you is not available. Once you are discharged, your primary care physician will handle any further medical issues. Please note that NO REFILLS for any discharge medications will be authorized once you are discharged, as it is imperative that you return to your primary care physician (or establish a relationship with a primary care physician if you do not have one) for your aftercare needs so that  they can reassess your need for medications and monitor your lab values.  Follow up with PCP, Gastroenterology, and Have PCP Refer for Diagnostic Mammogram. Take all medications as prescribed. If symptoms change or worsen please return to the ED for evaluation. Speak with GI about obtaining MRI of the Abdomen   Increase activity slowly   Complete by: As directed      Allergies as of 02/17/2019   No Known Allergies     Medication List    STOP taking these medications   colchicine 0.6 MG tablet   diclofenac sodium 1 % Gel Commonly known as: VOLTAREN   meloxicam 7.5 MG tablet Commonly known as: MOBIC     TAKE these medications   amLODipine 10 MG tablet Commonly known as: NORVASC TAKE 1 TABLET BY MOUTH DAILY   famotidine 20 MG tablet Commonly known as: PEPCID Take 1 tablet (20 mg total) by mouth daily. Start taking on: February 18, 2019   feeding supplement (ENSURE ENLIVE) Liqd Take 237 mLs by mouth 2 (two) times daily between meals.   multivitamin with minerals Tabs tablet Take 1 tablet by mouth daily. Start taking on: February 18, 2019   Potassium 99 MG Tabs Take 99 mg by mouth daily.      Follow-up Information    Horald Pollen, MD. Schedule an appointment as soon as possible for a visit in 1 week(s).   Specialty: Internal Medicine Why: Hospital follow-up Contact information: [place] [id number] [phone]        Wilford Corner, MD. Schedule an appointment as soon as possible for a visit.   Specialty: Gastroenterology Why: Arrange follow-up appointment with Dr. Michail Sermon to follow-up diverticular bleeding and to get his opinion regarding pancreatic duct abnormality seen on CT scan Contact information: [address] [place] [address] [phone]          No Known Allergies  Consultations:  Gastroenterology  Procedures/Studies: Ct Angio Abd/pel W/ And/or W/o  Result Date: 02/14/2019 CLINICAL DATA:   83 year old female with GI bleed. Concern for acute diverticulitis or diverticular bleed. EXAM: CTA ABDOMEN AND PELVIS wITHOUT AND WITH CONTRAST TECHNIQUE: Multidetector CT imaging of the abdomen and pelvis was performed using the standard protocol during bolus administration of intravenous contrast. Multiplanar reconstructed images and MIPs were obtained and reviewed to evaluate the vascular anatomy. CONTRAST:  129mL OMNIPAQUE IOHEXOL 350 MG/ML SOLN COMPARISON:  CT of the abdomen pelvis dated 04/14/2018 FINDINGS: VASCULAR Aorta: Mild atherosclerotic calcification. No aneurysmal dilatation or dissection. Celiac: Patent without evidence of aneurysm, dissection, vasculitis or significant stenosis. SMA: Patent without evidence of aneurysm, dissection, vasculitis or significant stenosis. Renals: Both renal arteries are patent without evidence of aneurysm, dissection, vasculitis, fibromuscular dysplasia or significant stenosis. IMA: Patent without evidence of aneurysm, dissection, vasculitis or significant stenosis. Inflow: Mild atherosclerotic calcification. No aneurysmal dilatation or dissection. The iliac arteries are widely patent. Proximal Outflow: Bilateral common femoral and visualized portions of the superficial and profunda  femoral arteries are patent without evidence of aneurysm, dissection, vasculitis or significant stenosis. Veins: The IVC, iliac veins are patent. No portal venous gas. The SMV, splenic vein, and main portal vein are patent. Review of the MIP images confirms the above findings. NON-VASCULAR Lower chest: Left lung base linear atelectasis/scarring. The visualized lung bases are otherwise clear. Coronary vascular calcification. No intra-abdominal free air or free fluid. Hepatobiliary: The liver is unremarkable. No intrahepatic biliary ductal dilatation. Layering sludge or small stones noted within the gallbladder. No pericholecystic fluid. Pancreas: The pancreas is somewhat atrophic with areas of  dilatation of the main pancreatic duct. There is a coarse calcification in the uncinate process of the pancreas. These findings are similar to the prior CT and may be sequela of chronic and recurrent pancreatitis. MRI may provide better evaluation of the pancreas if clinically indicated. Spleen: Normal in size without focal abnormality. Adrenals/Urinary Tract: The adrenal glands are unremarkable. Bilateral renal parapelvic cysts. There is no hydronephrosis or nephrolithiasis on either side. The urinary bladder is distended and grossly unremarkable. Stomach/Bowel: There is colonic diverticulosis the without definite active inflammatory changes. No active diverticular bleed identified. There is no bowel obstruction. No active inflammatory changes. The appendix is not visualized with certainty. No inflammatory changes identified in the right lower quadrant. Lymphatic: Small retroperitoneal and mesenteric lymph nodes, of indeterminate etiology, possibly reactive. Reproductive: Hysterectomy. No pelvic mass. Other: There is a 1 cm nodular density in the inferior left breast. Clinical correlation is recommended. Musculoskeletal: Osteopenia with degenerative changes of the spine. No acute osseous pathology. IMPRESSION: 1. No acute intra-abdominal or pelvic pathology. No CT evidence of bowel ischemia or active diverticular bleed. 2. Colonic diverticulosis. No bowel obstruction or active inflammation. 3. Atrophic pancreas with areas of dilatation of the main pancreatic duct similar to prior CT and may be sequela of chronic and recurrent pancreatitis. MRI may provide better evaluation of the pancreas if clinically indicated. 4. A 1 cm nodular density in the inferior left breast. Clinical correlation is recommended. Electronically Signed   By: Anner Crete M.D.   On: 02/14/2019 01:26   EGD Findings: The perianal and digital rectal examinations were normal. Many small and large-mouthed diverticula were found  in the entire colon.  Each of the diverticula were lavaged using copious amounts of sterile  water, resulting in clearance with good visualization. No active  bleeding nor stigmata of recent site of bleeding noted. Clotted blood and blood tinged fluid was found in the rectum, in the  recto-sigmoid colon, in the sigmoid colon and in the distal descending  colon. The colon lumen was aggressively irrigated with copious amounts  of sterile water, resulting in clearance with good visualization. The  blood tinged fluid resolved and most clots were removed as well,  resulting in good visualization. No active bleeding noted. No return of  blood tinged fluid. The colonsocope was then re-advanced to the cecum  and again withdran slowly for a 2nd look thoughout the colon. No  bleeding noted, and no return of blood tinged fluid. No high grade  stigmata of bleeding seen. The retroflexed view of the distal rectum and anal verge was normal and  showed no anal or rectal abnormalities. Two sessile polyps were found in the transverse colon and ascending  colon. The polyps were 2 to 5 mm in size. Given the obscure GI bleed,  these were not resected so to not complicate the course with a possible  post polypectomy bleed. The terminal ileum appeared normal.  Impression: - Diverticulosis in the entire examined colon. - Blood in the rectum, in the recto-sigmoid colon,  in the sigmoid colon and in the distal descending  colon. - The distal rectum and anal verge are normal on  retroflexion view. - Two 2 to 5 mm polyps in the transverse colon and  in the ascending colon. - The examined portion of  the ileum was normal.  Subjective: Seen and examined at bedside and she is doing well.  Happy that her electrolytes improved.  Denies any chest pain, lightheadedness or dizziness.  No nausea vomiting.  No other bleeding noted.  No other concerns about this time and she is ready for discharge and understand that she will need to follow-up with PCP and gastroenterology in outpatient setting.  Discharge Exam: Vitals:   02/17/19 0400 02/17/19 0741  BP:  132/62  Pulse:  78  Resp:  17  Temp: 98.1 F (36.7 C) 97.8 F (36.6 C)  SpO2:  98%   Vitals:   02/17/19 0000 02/17/19 0350 02/17/19 0400 02/17/19 0741  BP: 105/63 (!) 111/59  132/62  Pulse: 86 78  78  Resp: 18   17  Temp:   98.1 F (36.7 C) 97.8 F (36.6 C)  TempSrc:   Oral Axillary  SpO2: 99% 98%  98%  Weight:      Height:       General: Pt is alert, awake, not in acute distress Cardiovascular: RRR, S1/S2 +, no rubs, no gallops Respiratory: CTA bilaterally, no wheezing, no rhonchi Abdominal: Soft, NT, ND, bowel sounds + Extremities: no edema, no cyanosis  The results of significant diagnostics from this hospitalization (including imaging, microbiology, ancillary and laboratory) are listed below for reference.    Microbiology: Recent Results (from the past 240 hour(s))  SARS Coronavirus 2 [facility] order, Performed in [facility] hospital lab) Nasopharyngeal Nasopharyngeal Swab     Status: None   Collection Time: 02/12/19 12:54 AM   Specimen: Nasopharyngeal Swab  Result Value Ref Range Status   SARS Coronavirus 2 NEGATIVE NEGATIVE Final    Comment: (NOTE) If result is NEGATIVE SARS-CoV-2 target nucleic acids are NOT DETECTED. The SARS-CoV-2 RNA is generally detectable in upper and lower  respiratory specimens during the acute phase of infection. The lowest  concentration of SARS-CoV-2 viral copies this assay can detect is 250  copies / mL. A negative result does not preclude SARS-CoV-2 infection  and should not be used as  the sole basis for treatment or other  patient management decisions.  A negative result may occur with  improper specimen collection / handling, submission of specimen other  than nasopharyngeal swab, presence of viral mutation(s) within the  areas targeted by this assay, and inadequate number of viral copies  (<250 copies / mL). A negative result must be combined with clinical  observations, patient history, and epidemiological information. If result is POSITIVE SARS-CoV-2 target nucleic acids are DETECTED. The SARS-CoV-2 RNA is generally detectable in upper and lower  respiratory specimens dur ing the acute phase of infection.  Positive  results are indicative of active infection with SARS-CoV-2.  Clinical  correlation with patient history and other diagnostic information is  necessary to determine patient infection status.  Positive results do  not rule out bacterial infection or co-infection with other viruses. If result is PRESUMPTIVE POSTIVE SARS-CoV-2 nucleic acids MAY BE PRESENT.   A presumptive positive result was obtained on the submitted specimen  and confirmed on repeat testing.  While 2019 novel coronavirus  (  SARS-CoV-2) nucleic acids may be present in the submitted sample  additional confirmatory testing may be necessary for epidemiological  and / or clinical management purposes  to differentiate between  SARS-CoV-2 and other Sarbecovirus currently known to infect humans.  If clinically indicated additional testing with an alternate test  methodology [phone]) is advised. The SARS-CoV-2 RNA is generally  detectable in upper and lower respiratory sp ecimens during the acute  phase of infection. The expected result is Negative. Fact Sheet for Patients:  StrictlyIdeas.no Fact Sheet for Healthcare Providers: BankingDealers.co.za This test is not yet approved or cleared by the [place] FDA and has been authorized for  detection and/or diagnosis of SARS-CoV-2 by FDA under an Emergency Use Authorization (EUA).  This EUA will remain in effect (meaning this test can be used) for the duration of the COVID-19 declaration under Section 564(b)(1) of the Act, 21 U.S.C. section 360bbb-3(b)(1), unless the authorization is terminated or revoked sooner. Performed at [facility], [place] [address], [place], [place] [address]     Labs: BNP (last 3 results) No results for input(s): BNP in the last 8760 hours. Basic Metabolic Panel: Recent Labs  Lab 02/12/19 0700 02/13/19 2310 02/14/19 2218 02/16/19 0942 02/17/19 0234  NA 140 138 139 140 138  K 3.2* 3.1* 3.2* 2.7* 4.2  CL 107 106 107 105 105  CO2 27 25 26 27 26   GLUCOSE 108* 167* 110* 155* 127*  BUN 16 13 7* <5* 8  CREATININE 0.86 0.94 0.68 0.83 1.01*  CALCIUM 9.3 8.7* 8.7* 9.8 9.8  MG 1.9  --   --  1.6* 2.1  PHOS  --   --   --  2.9 3.3   Liver Function Tests: Recent Labs  Lab 02/11/19 1545 02/16/19 0942 02/17/19 0234  AST 17 20 13*  ALT 15 14 12   ALKPHOS 65 56 54  BILITOT 0.6 0.6 0.4  PROT 5.5* 4.9* 4.6*  ALBUMIN 3.3* 3.0* 2.6*   No results for input(s): LIPASE, AMYLASE in the last 168 hours. No results for input(s): AMMONIA in the last 168 hours. CBC: Recent Labs  Lab 02/13/19 2310 02/14/19 0817 02/14/19 2218 02/16/19 0942 02/17/19 0234  WBC 6.8 7.1 5.5 7.7 5.8  NEUTROABS  --   --   --  5.5 3.6  HGB 6.2* 10.2* 9.0* 10.3* 9.1*  HCT 18.1* 28.6* 25.4* 29.7* 26.3*  MCV 77.0* 82.2 81.9 82.3 84.0  PLT 130* 122* 141* 183 174   Cardiac Enzymes: No results for input(s): CKTOTAL, CKMB, CKMBINDEX, TROPONINI in the last 168 hours. BNP: Invalid input(s): POCBNP CBG: Recent Labs  Lab 02/13/19 0752 02/13/19 2306 02/14/19 0744 02/16/19 0802 02/17/19 0731  GLUCAP 106* 146* 106* 109* 147*   D-Dimer No results for input(s): DDIMER in the last 72 hours. Hgb A1c No results for input(s): HGBA1C in the last 72 hours. Lipid Profile No  results for input(s): CHOL, HDL, LDLCALC, TRIG, CHOLHDL, LDLDIRECT in the last 72 hours. Thyroid function studies No results for input(s): TSH, T4TOTAL, T3FREE, THYROIDAB in the last 72 hours.  Invalid input(s): FREET3 Anemia work up No results for input(s): VITAMINB12, FOLATE, FERRITIN, TIBC, IRON, RETICCTPCT in the last 72 hours. Urinalysis    Component Value Date/Time   COLORURINE STRAW (A) 04/14/2018 1521   APPEARANCEUR CLEAR 04/14/2018 1521   LABSPEC 1.015 04/14/2018 1521   PHURINE 7.0 04/14/2018 1521   GLUCOSEU NEGATIVE 04/14/2018 1521   HGBUR LARGE (A) 04/14/2018 1521   BILIRUBINUR NEGATIVE 04/14/2018 1521  BILIRUBINUR negative 11/08/2015 1258   KETONESUR NEGATIVE 04/14/2018 1521   PROTEINUR NEGATIVE 04/14/2018 1521   UROBILINOGEN 0.2 11/08/2015 1258   UROBILINOGEN 1.0 06/20/2009 1449   NITRITE NEGATIVE 04/14/2018 1521   LEUKOCYTESUR NEGATIVE 04/14/2018 1521   Sepsis Labs Invalid input(s): PROCALCITONIN,  WBC,  LACTICIDVEN Microbiology Recent Results (from the past 240 hour(s))  SARS Coronavirus 2 [facility] order, Performed in [facility] hospital lab) Nasopharyngeal Nasopharyngeal Swab     Status: None   Collection Time: 02/12/19 12:54 AM   Specimen: Nasopharyngeal Swab  Result Value Ref Range Status   SARS Coronavirus 2 NEGATIVE NEGATIVE Final    Comment: (NOTE) If result is NEGATIVE SARS-CoV-2 target nucleic acids are NOT DETECTED. The SARS-CoV-2 RNA is generally detectable in upper and lower  respiratory specimens during the acute phase of infection. The lowest  concentration of SARS-CoV-2 viral copies this assay can detect is 250  copies / mL. A negative result does not preclude SARS-CoV-2 infection  and should not be used as the sole basis for treatment or other  patient management decisions.  A negative result may occur with  improper specimen collection / handling, submission of specimen other  than nasopharyngeal swab, presence of viral mutation(s) within  the  areas targeted by this assay, and inadequate number of viral copies  (<250 copies / mL). A negative result must be combined with clinical  observations, patient history, and epidemiological information. If result is POSITIVE SARS-CoV-2 target nucleic acids are DETECTED. The SARS-CoV-2 RNA is generally detectable in upper and lower  respiratory specimens dur ing the acute phase of infection.  Positive  results are indicative of active infection with SARS-CoV-2.  Clinical  correlation with patient history and other diagnostic information is  necessary to determine patient infection status.  Positive results do  not rule out bacterial infection or co-infection with other viruses. If result is PRESUMPTIVE POSTIVE SARS-CoV-2 nucleic acids MAY BE PRESENT.   A presumptive positive result was obtained on the submitted specimen  and confirmed on repeat testing.  While 2019 novel coronavirus  (SARS-CoV-2) nucleic acids may be present in the submitted sample  additional confirmatory testing may be necessary for epidemiological  and / or clinical management purposes  to differentiate between  SARS-CoV-2 and other Sarbecovirus currently known to infect humans.  If clinically indicated additional testing with an alternate test  methodology [phone]) is advised. The SARS-CoV-2 RNA is generally  detectable in upper and lower respiratory sp ecimens during the acute  phase of infection. The expected result is Negative. Fact Sheet for Patients:  StrictlyIdeas.no Fact Sheet for Healthcare Providers: BankingDealers.co.za This test is not yet approved or cleared by the [place] FDA and has been authorized for detection and/or diagnosis of SARS-CoV-2 by FDA under an Emergency Use Authorization (EUA).  This EUA will remain in effect (meaning this test can be used) for the duration of the COVID-19 declaration under Section 564(b)(1) of the Act, 21  U.S.C. section 360bbb-3(b)(1), unless the authorization is terminated or revoked sooner. Performed at [facility], [place] [address] [place], [place] [address]    Time coordinating discharge: 35 minutes  SIGNED:  Kerney Elbe, DO [facility] 02/17/2019, 10:55 AM Pager is on [place]  If 7PM-7AM, please contact night-coverage [email or web address] Password [email or web address]

## 2019-03-07 ENCOUNTER — Telehealth (INDEPENDENT_AMBULATORY_CARE_PROVIDER_SITE_OTHER): Payer: Medicare Other | Admitting: Gastroenterology

## 2019-03-07 ENCOUNTER — Other Ambulatory Visit: Payer: Self-pay

## 2019-03-07 VITALS — Ht 59.0 in | Wt 124.0 lb

## 2019-03-07 DIAGNOSIS — Z86010 Personal history of colonic polyps: Secondary | ICD-10-CM

## 2019-03-07 DIAGNOSIS — R935 Abnormal findings on diagnostic imaging of other abdominal regions, including retroperitoneum: Secondary | ICD-10-CM | POA: Diagnosis not present

## 2019-03-07 DIAGNOSIS — K5730 Diverticulosis of large intestine without perforation or abscess without bleeding: Secondary | ICD-10-CM

## 2019-03-07 DIAGNOSIS — D500 Iron deficiency anemia secondary to blood loss (chronic): Secondary | ICD-10-CM

## 2019-03-07 NOTE — Patient Instructions (Signed)
If you are age 81 or older, your body mass index should be between 23-30. Your Body mass index is 25.04 kg/m. If this is out of the aforementioned range listed, please consider follow up with your Primary Care Provider.  If you are age 82 or younger, your body mass index should be between 19-25. Your Body mass index is 25.04 kg/m. If this is out of the aformentioned range listed, please consider follow up with your Primary Care Provider.   You have been scheduled for an MRI at [facility] on 03/19/19. Your appointment time is 11am. Please arrive 30 minutes prior to your appointment time for registration purposes. You will need to check in at the Emergency Room and tell them that you are there for an outpatient MRI.  Please make certain not to have anything to eat or drink 6 hours prior to your test. In addition, if you have any metal in your body, have a pacemaker or defibrillator, please be sure to let your ordering physician know. This test typically takes 45 minutes to 1 hour to complete. Should you need to reschedule, please call [phone] to do so.   Please go to the lab at [facility] ([place] You will need to go to level "B", you do not need an appointment for this. Hours available are 7:30 am - 4:30 pm.   Follow up in 3-6 months.   It was a pleasure to see you today!  Vito Cirigliano, D.O.

## 2019-03-07 NOTE — Progress Notes (Signed)
P  Chief Complaint:    Hematochezia, diverticulosis, hospital follow-up  Due to current restrictions/limitations of in-office visits due to the COVID-19 pandemic, this scheduled clinical appointment was converted to a telehealth consultation via telephone.  -Time of medical discussion: 25 minutes -The patient did consent to this telephone visit and is aware of possible charges through their insurance for this visit.  -Names of all parties present: Latoya Bennett (patient), Gerrit Heck, DO, [facility] (physician) -Patient location: Home  HPI:    Patient is a 83 y.o. female with a history of diverticulosis, hypertension, GERD, gout, PACs, presenting to the Gastroenterology Clinic for follow-up.  She has a history of recurrent GIB, with admission in 04/2018, presumed  2/2 diverticular bleed at that time.  She was hospitalized 8/7-13 with hematochezia.  Hgb nadir 6.2, received a total of 5 units PRBCs.  Had an extensive evaluation, to include CT Angio (-), colonoscopy (pandiverticulosis, old blood noted in left colon without acute bleeding; 2 small polyps in the transverse/ascending colon-not removed).  Was presumed to be a stuttering diverticular bleed.  Discharged with hemoglobin 9.1.  As inpatient, ferritin 22, iron 128, TIBC 272, sat 47%. Normal B12/folate.   Baseline is probably around 13, as seen in 2018. Hgb was 11.3 on admission in 04/2018, but no other outpatient labs for review of true baseline.   Hospital evaluation also notable for atrophic appearing pancreas and PD dilation incidentally noted on CT Angio, with plan for outpatient MRI.  No prior history of known pancreatitis.  Today, she states she has had no recurrence of bleeding since hospital d/c.  Does still have left ankle swelling, which was previously attributed to arthritis and has been present for years. Has had f/u with her PCM.   Lives alone and not a strong social support network locally, so she is considering  moving back to [place] where she has friends/family.   No new labs or imaging since hospital discharge.  Endoscopic history: - Colonoscopy (02/2019, Dr. Bryan Lemma; inpatient): Pandiverticulosis, old blood in the left colon without active bleeding.  2 small (2-5 mm) polyps in the transverse and ascending colon-not removed - CT Angio (02/2019): No active bleeding -Colonoscopy (09/2005, Dr. Ardis Hughs, inpatient for hematochezia):  Colon diverticulosis.  Presumed to have had diverticular source for bleeding. -EGD (04/2018, Dr Paulita Fujita, inpatient for hematochezia): small HH, mild pre-pyloric gastritis.  O/W normal endoscopy to second portion of duodenum.  - tagged RBC scan 07/18/2017 showing bleeding activity in distal stomach/proximal duodenum.  Hgb 8.9, transfused 3 PRBC. On further review of tagged RBC study, IR felt that the bleed may have been in the proximal colon rather than small bowel.    Review of systems:     No chest pain, no SOB, no fevers, no urinary sx   Past Medical History:  Diagnosis Date  . Arthritis   . Diverticulitis   . GERD (gastroesophageal reflux disease)   . GI bleed   . Hypertension     Patient's surgical history, family medical history, social history, medications and allergies were all reviewed in Epic    Current Outpatient Medications  Medication Sig Dispense Refill  . amLODipine (NORVASC) 10 MG tablet TAKE 1 TABLET BY MOUTH DAILY (Patient taking differently: Take 10 mg by mouth daily. ) 90 tablet 0  . famotidine (PEPCID) 20 MG tablet Take 1 tablet (20 mg total) by mouth daily. 30 tablet 0  . feeding supplement, ENSURE ENLIVE, (ENSURE ENLIVE) LIQD Take 237 mLs by mouth 2 (two) times  daily between meals. 237 mL 12  . Multiple Vitamin (MULTIVITAMIN WITH MINERALS) TABS tablet Take 1 tablet by mouth daily. 30 tablet 0  . Potassium 99 MG TABS Take 99 mg by mouth daily.     No current facility-administered medications for this visit.     Physical Exam:    Physical exam  not completed due to the nature of this telehealth communication.  Patient was otherwise alert and oriented and well communicative.   IMPRESSION and PLAN:    1) Diverticulosis 2) Acute blood loss anemia -History of recurrent, stuttering diverticular bleed.  Does have pandiverticulosis.  Exact location not found on multiple endoscopic studies, tagged RBC scan, CT angiography on multiple admissions.  Has required transfusions on at least 2 admissions.  Briefly discussed surgical intervention, but given age, comorbidities, involvement of surgery, she does not want to pursue this option. - Repeat Hgb and iron panel - Continue conservative management -High-fiber diet  3) Colon polyps - Does not want to pursue repeat colonoscopy for resection of small polyps noted on recent colonoscopy  4) Abnormal pancreas on CT -MRI pancreas -No known personal history of pancreaticobiliary disease  5) Nodular breast density - Incidentally noted 1 cm nodular density in the inferior left breast on CT as an inpatient. -Follow-up with PCM to discuss whether further imaging versus bx is needed     RTC in 3 to 6 months or sooner as needed     Lavena Bullion ,DO, FACG 03/07/2019, 9:14 AM

## 2019-03-09 DIAGNOSIS — E669 Obesity, unspecified: Secondary | ICD-10-CM | POA: Diagnosis not present

## 2019-03-09 DIAGNOSIS — Z008 Encounter for other general examination: Secondary | ICD-10-CM | POA: Diagnosis not present

## 2019-03-09 DIAGNOSIS — Z0000 Encounter for general adult medical examination without abnormal findings: Secondary | ICD-10-CM | POA: Diagnosis not present

## 2019-03-09 DIAGNOSIS — Z79899 Other long term (current) drug therapy: Secondary | ICD-10-CM | POA: Diagnosis not present

## 2019-03-09 DIAGNOSIS — Z1159 Encounter for screening for other viral diseases: Secondary | ICD-10-CM | POA: Diagnosis not present

## 2019-03-09 DIAGNOSIS — I872 Venous insufficiency (chronic) (peripheral): Secondary | ICD-10-CM | POA: Diagnosis not present

## 2019-03-09 DIAGNOSIS — M25562 Pain in left knee: Secondary | ICD-10-CM | POA: Diagnosis not present

## 2019-03-09 DIAGNOSIS — K5790 Diverticulosis of intestine, part unspecified, without perforation or abscess without bleeding: Secondary | ICD-10-CM | POA: Diagnosis not present

## 2019-03-09 DIAGNOSIS — G8929 Other chronic pain: Secondary | ICD-10-CM | POA: Diagnosis not present

## 2019-03-09 DIAGNOSIS — Z6831 Body mass index (BMI) 31.0-31.9, adult: Secondary | ICD-10-CM | POA: Diagnosis not present

## 2019-03-09 DIAGNOSIS — M25561 Pain in right knee: Secondary | ICD-10-CM | POA: Diagnosis not present

## 2019-03-16 ENCOUNTER — Other Ambulatory Visit (INDEPENDENT_AMBULATORY_CARE_PROVIDER_SITE_OTHER): Payer: Medicare Other

## 2019-03-16 DIAGNOSIS — Z86010 Personal history of colonic polyps: Secondary | ICD-10-CM | POA: Diagnosis not present

## 2019-03-16 DIAGNOSIS — K5730 Diverticulosis of large intestine without perforation or abscess without bleeding: Secondary | ICD-10-CM

## 2019-03-16 DIAGNOSIS — D500 Iron deficiency anemia secondary to blood loss (chronic): Secondary | ICD-10-CM

## 2019-03-16 DIAGNOSIS — R935 Abnormal findings on diagnostic imaging of other abdominal regions, including retroperitoneum: Secondary | ICD-10-CM

## 2019-03-16 LAB — IBC + FERRITIN
Ferritin: 7.9 ng/mL — ABNORMAL LOW (ref 10.0–291.0)
Iron: 91 ug/dL (ref 42–145)
Saturation Ratios: 22.5 % (ref 20.0–50.0)
Transferrin: 289 mg/dL (ref 212.0–360.0)

## 2019-03-16 LAB — CBC WITH DIFFERENTIAL/PLATELET
Basophils Absolute: 0.1 10*3/uL (ref 0.0–0.1)
Basophils Relative: 0.9 % (ref 0.0–3.0)
Eosinophils Absolute: 0 10*3/uL (ref 0.0–0.7)
Eosinophils Relative: 0.6 % (ref 0.0–5.0)
HCT: 33.4 % — ABNORMAL LOW (ref 36.0–46.0)
Hemoglobin: 11.1 g/dL — ABNORMAL LOW (ref 12.0–15.0)
Lymphocytes Relative: 22.8 % (ref 12.0–46.0)
Lymphs Abs: 1.6 10*3/uL (ref 0.7–4.0)
MCHC: 33.4 g/dL (ref 30.0–36.0)
MCV: 76.7 fl — ABNORMAL LOW (ref 78.0–100.0)
Monocytes Absolute: 0.4 10*3/uL (ref 0.1–1.0)
Monocytes Relative: 5.5 % (ref 3.0–12.0)
Neutro Abs: 4.9 10*3/uL (ref 1.4–7.7)
Neutrophils Relative %: 70.2 % (ref 43.0–77.0)
Platelets: 248 10*3/uL (ref 150.0–400.0)
RBC: 4.35 Mil/uL (ref 3.87–5.11)
RDW: 18.8 % — ABNORMAL HIGH (ref 11.5–15.5)
WBC: 7 10*3/uL (ref 4.0–10.5)

## 2019-03-19 ENCOUNTER — Ambulatory Visit (HOSPITAL_COMMUNITY)
Admission: RE | Admit: 2019-03-19 | Discharge: 2019-03-19 | Disposition: A | Discharge status: Home / Self Care | Payer: Medicare Other | Source: Ambulatory Visit | Attending: Gastroenterology | Admitting: Gastroenterology

## 2019-03-19 DIAGNOSIS — D500 Iron deficiency anemia secondary to blood loss (chronic): Secondary | ICD-10-CM | POA: Insufficient documentation

## 2019-03-19 DIAGNOSIS — Z86010 Personal history of colonic polyps: Secondary | ICD-10-CM | POA: Insufficient documentation

## 2019-03-19 DIAGNOSIS — R935 Abnormal findings on diagnostic imaging of other abdominal regions, including retroperitoneum: Secondary | ICD-10-CM | POA: Diagnosis not present

## 2019-03-19 DIAGNOSIS — K5730 Diverticulosis of large intestine without perforation or abscess without bleeding: Secondary | ICD-10-CM | POA: Diagnosis not present

## 2019-03-19 DIAGNOSIS — K869 Disease of pancreas, unspecified: Secondary | ICD-10-CM | POA: Diagnosis not present

## 2019-03-19 MED ORDER — GADOBUTROL 1 MMOL/ML IV SOLN
5.0000 mL | Freq: Once | INTRAVENOUS | Status: AC | PRN
  Administered 2019-03-19: 5 mL via INTRAVENOUS

## 2019-03-25 ENCOUNTER — Telehealth: Payer: Self-pay | Admitting: Gastroenterology

## 2019-03-25 NOTE — Telephone Encounter (Signed)
Please see additional information for this patient;

## 2019-03-28 ENCOUNTER — Other Ambulatory Visit: Payer: Self-pay

## 2019-03-28 DIAGNOSIS — D500 Iron deficiency anemia secondary to blood loss (chronic): Secondary | ICD-10-CM

## 2019-03-28 MED ORDER — FERROUS SULFATE 325 (65 FE) MG PO TABS: 650.0000 mg | ORAL_TABLET | ORAL | 0 refills | Status: DC

## 2019-03-29 NOTE — Telephone Encounter (Signed)
Spoke with patient to clarify oral iron supplementation-patient's questions were answered; patient was advised to call back to the office should further questions/concerns arise; Patient verbalized understanding of information/instructions;

## 2019-04-28 ENCOUNTER — Telehealth: Payer: Self-pay | Admitting: Gastroenterology

## 2019-04-28 NOTE — Telephone Encounter (Signed)
No problem at all. The every other day dosing was done because sometimes patients do not tolerate daily dosing (abdominal pain, constipation, etc), and every other day has been shown to be just as efficacious, but less side effects. Thanks.

## 2019-04-28 NOTE — Telephone Encounter (Signed)
Called Mrs. Lovejoy back regarding Ferrous Sulfate prescription. Patient has taken Ferrous Sulfate 325mg  2 tablets by mouth daily for the past month or more. She just realized she was supposed to be taking it every other day. Patient has no complaints. I have instructed the patient to begin taking 2 tablets by mouth every other day and to call us with any concerns. Patient is aware of repeat labs needed in 2 months. Is there anything else you would like me to instruct the patient on?

## 2019-04-28 NOTE — Telephone Encounter (Signed)
Patient has been taking Ferrous Sulfate everyday instead of every other day. She has taken half of the medicine. She is asking if this would effect her at all and how she should proceed with taken the rest of her medication.

## 2019-05-12 ENCOUNTER — Other Ambulatory Visit: Payer: Self-pay | Admitting: Emergency Medicine

## 2019-05-12 DIAGNOSIS — I10 Essential (primary) hypertension: Secondary | ICD-10-CM

## 2019-07-28 ENCOUNTER — Other Ambulatory Visit (INDEPENDENT_AMBULATORY_CARE_PROVIDER_SITE_OTHER): Payer: Medicare Other

## 2019-07-28 DIAGNOSIS — D500 Iron deficiency anemia secondary to blood loss (chronic): Secondary | ICD-10-CM | POA: Diagnosis not present

## 2019-07-28 LAB — CBC WITH DIFFERENTIAL/PLATELET
Basophils Absolute: 0 10*3/uL (ref 0.0–0.1)
Basophils Relative: 0.8 % (ref 0.0–3.0)
Eosinophils Absolute: 0.1 10*3/uL (ref 0.0–0.7)
Eosinophils Relative: 1.9 % (ref 0.0–5.0)
HCT: 41.2 % (ref 36.0–46.0)
Hemoglobin: 13.9 g/dL (ref 12.0–15.0)
Lymphocytes Relative: 42.8 % (ref 12.0–46.0)
Lymphs Abs: 2.3 10*3/uL (ref 0.7–4.0)
MCHC: 33.8 g/dL (ref 30.0–36.0)
MCV: 74.5 fl — ABNORMAL LOW (ref 78.0–100.0)
Monocytes Absolute: 0.4 10*3/uL (ref 0.1–1.0)
Monocytes Relative: 7.7 % (ref 3.0–12.0)
Neutro Abs: 2.5 10*3/uL (ref 1.4–7.7)
Neutrophils Relative %: 46.8 % (ref 43.0–77.0)
Platelets: 201 10*3/uL (ref 150.0–400.0)
RBC: 5.53 Mil/uL — ABNORMAL HIGH (ref 3.87–5.11)
RDW: 14.7 % (ref 11.5–15.5)
WBC: 5.3 10*3/uL (ref 4.0–10.5)

## 2019-07-28 LAB — IBC + FERRITIN
Ferritin: 16.8 ng/mL (ref 10.0–291.0)
Iron: 92 ug/dL (ref 42–145)
Saturation Ratios: 25.1 % (ref 20.0–50.0)
Transferrin: 262 mg/dL (ref 212.0–360.0)

## 2019-07-29 ENCOUNTER — Other Ambulatory Visit: Payer: Self-pay | Admitting: Gastroenterology

## 2019-07-29 DIAGNOSIS — D500 Iron deficiency anemia secondary to blood loss (chronic): Secondary | ICD-10-CM

## 2019-08-22 ENCOUNTER — Other Ambulatory Visit: Payer: Self-pay | Admitting: Emergency Medicine

## 2019-08-22 ENCOUNTER — Encounter: Payer: Self-pay | Admitting: Emergency Medicine

## 2019-08-22 ENCOUNTER — Telehealth (INDEPENDENT_AMBULATORY_CARE_PROVIDER_SITE_OTHER): Payer: Medicare Other | Admitting: Emergency Medicine

## 2019-08-22 ENCOUNTER — Other Ambulatory Visit: Payer: Self-pay

## 2019-08-22 DIAGNOSIS — I10 Essential (primary) hypertension: Secondary | ICD-10-CM

## 2019-08-22 MED ORDER — AMLODIPINE BESYLATE 10 MG PO TABS: 10.0000 mg | ORAL_TABLET | Freq: Every day | ORAL | 3 refills | Status: DC

## 2019-08-22 MED ORDER — AMLODIPINE BESYLATE 10 MG PO TABS: 10.0000 mg | ORAL_TABLET | Freq: Every day | ORAL | 0 refills | Status: DC

## 2019-08-22 NOTE — Telephone Encounter (Signed)
Medication Refill - Medication: amLODipine (NORVASC) 10 MG tablet   Pt is now out of medication  Has the patient contacted their pharmacy? Yes.   (Agent: If no, request that the patient contact the pharmacy for the refill.) (Agent: If yes, when and what did the pharmacy advise?)  Preferred Pharmacy (with phone number or street name): [facility] [place] [place] - [address] Phone:  [phone]  Fax:  [phone]       Agent: Please be advised that RX refills may take up to 3 business days. We ask that you follow-up with your pharmacy.

## 2019-08-22 NOTE — Progress Notes (Signed)
Telemedicine Encounter- SOAP NOTE Established Patient MyChart video virtual visit attempted without success. This telephone encounter was conducted with the patient's (or proxy's) verbal consent via audio telecommunications: yes/no: Yes Patient was instructed to have this encounter in a suitably private space; and to only have persons present to whom they give permission to participate. In addition, patient identity was confirmed by use of name plus two identifiers (DOB and address).  I discussed the limitations, risks, security and privacy concerns of performing an evaluation and management service by telephone and the availability of in person appointments. I also discussed with the patient that there may be a patient responsible charge related to this service. The patient expressed understanding and agreed to proceed.  I spent a total of TIME; 0 MIN TO 60 MIN: 15 minutes talking with the patient or their proxy.  No chief complaint on file.   Subjective   Latoya Bennett is a 84 y.o. female established patient. Telephone visit today for follow-up on hypertension and medication refill.  Patient also has a history of generalized arthritis with chronic joint pains. No other complaints or medical concerns today.  HPI   Patient Active Problem List   Diagnosis Date Noted  . Hypokalemia 02/12/2019  . GERD (gastroesophageal reflux disease) 02/12/2019  . Symptomatic anemia 02/12/2019  . GIB (gastrointestinal bleeding) 02/12/2019  . Diverticulosis of colon with hemorrhage   . Acute posthemorrhagic anemia   . Osteoarthritis of multiple joints 05/19/2018  . History of gout 05/19/2018  . Lower GI bleed 04/17/2018  . GI bleed 04/17/2018  . Chronic pain of both ankles 03/13/2017  . Osteoarthritis of both ankles 03/13/2017  . PAC (premature atrial contraction) 11/24/2016  . Essential hypertension 11/24/2016  . Primary osteoarthritis of left knee 11/24/2016  . Tachycardia 11/24/2016      Past Medical History:  Diagnosis Date  . Arthritis   . Diverticulitis   . GERD (gastroesophageal reflux disease)   . GI bleed   . Hypertension     Current Outpatient Medications  Medication Sig Dispense Refill  . amLODipine (NORVASC) 10 MG tablet Take 1 tablet (10 mg total) by mouth daily. 90 tablet 3  . feeding supplement, ENSURE ENLIVE, (ENSURE ENLIVE) LIQD Take 237 mLs by mouth 2 (two) times daily between meals. 237 mL 12  . FEROSUL 325 (65 Fe) MG tablet TAKE 2 TABLETS(650 MG) BY MOUTH EVERY OTHER DAY 90 tablet 0  . Multiple Vitamin (MULTIVITAMIN WITH MINERALS) TABS tablet Take 1 tablet by mouth daily. 30 tablet 0  . Potassium 99 MG TABS Take 99 mg by mouth daily.    . famotidine (PEPCID) 20 MG tablet Take 1 tablet (20 mg total) by mouth daily. (Patient not taking: Reported on 08/22/2019) 30 tablet 0   No current facility-administered medications for this visit.    No Known Allergies  Social History   Socioeconomic History  . Marital status: Single    Spouse name: Not on file  . Number of children: Not on file  . Years of education: Not on file  . Highest education level: Not on file  Occupational History  . Occupation: retired  Tobacco Use  . Smoking status: Never Smoker  . Smokeless tobacco: Never Used  Substance and Sexual Activity  . Alcohol use: Not Currently    Alcohol/week: 0.0 standard drinks  . Drug use: Never  . Sexual activity: Not on file  Other Topics Concern  . Not on file  Social History Narrative   Marital  status: single; not dating in 2018; moved from [place] to be near sister; not happy in [place].  Lived [place] for 50 years.      Children: no children      Lives: alone with cat      Employment: retired; Optometrist with computers      Tobacco:       Alcohol:       ADLs; independent with ADLs; drives   Social Determinants of Health   Financial Resource Strain:   . Difficulty of Paying Living Expenses: Not on file  Food  Insecurity:   . Worried About Charity fundraiser in the Last Year: Not on file  . Ran Out of Food in the Last Year: Not on file  Transportation Needs:   . Lack of Transportation (Medical): Not on file  . Lack of Transportation (Non-Medical): Not on file  Physical Activity:   . Days of Exercise per Week: Not on file  . Minutes of Exercise per Session: Not on file  Stress:   . Feeling of Stress : Not on file  Social Connections:   . Frequency of Communication with Friends and Family: Not on file  . Frequency of Social Gatherings with Friends and Family: Not on file  . Attends Religious Services: Not on file  . Active Member of Clubs or Organizations: Not on file  . Attends Archivist Meetings: Not on file  . Marital Status: Not on file  Intimate Partner Violence:   . Fear of Current or Ex-Partner: Not on file  . Emotionally Abused: Not on file  . Physically Abused: Not on file  . Sexually Abused: Not on file    Review of Systems  Constitutional: Negative.  Negative for chills and fever.  HENT: Negative.  Negative for congestion and sore throat.   Respiratory: Negative.  Negative for cough and shortness of breath.   Cardiovascular: Negative.  Negative for chest pain and palpitations.  Gastrointestinal: Negative.  Negative for abdominal pain, blood in stool, diarrhea, melena, nausea and vomiting.  Genitourinary: Negative.  Negative for dysuria and hematuria.  Musculoskeletal: Positive for joint pain.  Skin: Negative.  Negative for rash.  Neurological: Negative.  Negative for dizziness and headaches.  All other systems reviewed and are negative.   Objective  Alert and oriented x3 in no apparent respiratory distress. Vitals as reported by the patient: There were no vitals filed for this visit.  Diagnoses and all orders for this visit:  Essential hypertension -     amLODipine (NORVASC) 10 MG tablet; Take 1 tablet (10 mg total) by mouth daily.  Clinically stable.   No medical concerns identified during this visit.  Continue present medications.  No changes. Advised to take only Tylenol as needed for arthritis pain and avoid all kinds of NSAIDs. Office visit in 3 to 6 months.   I discussed the assessment and treatment plan with the patient. The patient was provided an opportunity to ask questions and all were answered. The patient agreed with the plan and demonstrated an understanding of the instructions.   The patient was advised to call back or seek an in-person evaluation if the symptoms worsen or if the condition fails to improve as anticipated.  I provided 15 minutes of non-face-to-face time during this encounter.  Horald Pollen, MD  Primary Care at [facility]

## 2019-08-22 NOTE — Patient Instructions (Addendum)
° ° ° °  If you have lab work done today you will be contacted with your lab results within the next 2 weeks.  If you have not heard from us then please contact us. The fastest way to get your results is to register for My Chart. ° ° °IF you received an x-ray today, you will receive an invoice from [facility]. Please contact [facility] at [phone] with questions or concerns regarding your invoice.  ° °IF you received labwork today, you will receive an invoice from [facility]. Please contact [facility] at [phone] with questions or concerns regarding your invoice.  ° °Our billing staff will not be able to assist you with questions regarding bills from these companies. ° °You will be contacted with the lab results as soon as they are available. The fastest way to get your results is to activate your My Chart account. Instructions are located on the last page of this paperwork. If you have not heard from us regarding the results in 2 weeks, please contact this office. °  ° ° ° °

## 2019-08-22 NOTE — Telephone Encounter (Signed)
Requested Prescriptions  Pending Prescriptions Disp Refills  . amLODipine (NORVASC) 10 MG tablet 30 tablet 0    Sig: Take 1 tablet (10 mg total) by mouth daily.     Cardiovascular:  Calcium Channel Blockers Failed - 08/22/2019  9:27 AM      Failed - Valid encounter within last 6 months    Recent Outpatient Visits          7 months ago Pain of left upper arm   Primary [facility] at [facility], Lilia Argue, [place]   9 months ago Osteoarthritis of multiple joints, unspecified osteoarthritis type   Primary Care at [facility], Ines Bloomer, [place]   1 year ago Chronic pain of both ankles   Primary Care at [facility], Ines Bloomer, [place]   1 year ago Right foot pain   Primary Care at [place], [place]   1 year ago History of lower GI bleeding   Primary Care at [facility], Gelene Mink, PA-C      Future Appointments            Today [place], Ines Bloomer, [place] Primary Care at [place], [place] BP in normal range    BP Readings from Last 1 Encounters:  02/17/19 132/62         Courtesy Refill for patient.  Patient has an virtual appt with Dr. Franky Macho today.

## 2019-08-22 NOTE — Progress Notes (Signed)
Arthritis pain from the left side down (from shoulder to ankles) and see what she can take for the pain.   Med refill- BP med

## 2019-09-14 ENCOUNTER — Telehealth: Payer: Self-pay | Admitting: *Deleted

## 2019-09-14 NOTE — Telephone Encounter (Signed)
Patient declined AWV 

## 2019-10-11 ENCOUNTER — Telehealth: Payer: Self-pay

## 2019-10-11 NOTE — Telephone Encounter (Signed)
Spoke to patient this morning to remind her that lab work is due this month for IDA. Patient states she does not want any further testing done on her. The importance of having repeat lab work was explained to her,but she still refused. MD aware.

## 2019-10-11 NOTE — Telephone Encounter (Signed)
-----   Message from Angie Fava, LPN sent at [id number]  2:33 PM EST ----- Regarding: labs CALL PATIENT TO HAVE HER LABS DONE IN April. ORDERS PLACED.

## 2019-11-01 ENCOUNTER — Other Ambulatory Visit: Payer: Self-pay

## 2019-11-01 ENCOUNTER — Telehealth (INDEPENDENT_AMBULATORY_CARE_PROVIDER_SITE_OTHER): Payer: Medicare Other | Admitting: Emergency Medicine

## 2019-11-01 ENCOUNTER — Encounter: Payer: Self-pay | Admitting: Emergency Medicine

## 2019-11-01 DIAGNOSIS — M25561 Pain in right knee: Secondary | ICD-10-CM | POA: Diagnosis not present

## 2019-11-01 DIAGNOSIS — G8929 Other chronic pain: Secondary | ICD-10-CM

## 2019-11-01 DIAGNOSIS — M25562 Pain in left knee: Secondary | ICD-10-CM

## 2019-11-01 NOTE — Progress Notes (Signed)
Telemedicine Encounter- SOAP NOTE Established Patient  This telephone encounter was conducted with the patient's (or proxy's) verbal consent via audio telecommunications: yes/no: Yes Patient was instructed to have this encounter in a suitably private space; and to only have persons present to whom they give permission to participate. In addition, patient identity was confirmed by use of name plus two identifiers (DOB and address).  I discussed the limitations, risks, security and privacy concerns of performing an evaluation and management service by telephone and the availability of in person appointments. I also discussed with the patient that there may be a patient responsible charge related to this service. The patient expressed understanding and agreed to proceed.  I spent a total of TIME; 0 MIN TO 60 MIN: 15 minutes talking with the patient or their proxy.  Chief Complaint  Patient presents with  . Knee Pain    pt not walking well Lt side pt thinks she will need surgery wants [place]opedic [place]     Subjective   Latoya Bennett is a 84 y.o. female established patient. Telephone visit today complaining of bilateral knee pains making it hard to ambulate for several weeks. Requesting referral to orthopedic Center [place] [address] [place]. No other complaints or medical concerns.  HPI   Patient Active Problem List   Diagnosis Date Noted  . Hypokalemia 02/12/2019  . GERD (gastroesophageal reflux disease) 02/12/2019  . Symptomatic anemia 02/12/2019  . GIB (gastrointestinal bleeding) 02/12/2019  . Diverticulosis of colon with hemorrhage   . Acute posthemorrhagic anemia   . Osteoarthritis of multiple joints 05/19/2018  . History of gout 05/19/2018  . Lower GI bleed 04/17/2018  . GI bleed 04/17/2018  . Chronic pain of both ankles 03/13/2017  . Osteoarthritis of both ankles 03/13/2017  . PAC (premature atrial contraction) 11/24/2016  . Essential  hypertension 11/24/2016  . Primary osteoarthritis of left knee 11/24/2016  . Tachycardia 11/24/2016    Past Medical History:  Diagnosis Date  . Arthritis   . Diverticulitis   . GERD (gastroesophageal reflux disease)   . GI bleed   . Hypertension     Current Outpatient Medications  Medication Sig Dispense Refill  . amLODipine (NORVASC) 10 MG tablet Take 1 tablet (10 mg total) by mouth daily. 90 tablet 3  . famotidine (PEPCID) 20 MG tablet Take 1 tablet (20 mg total) by mouth daily. 30 tablet 0  . feeding supplement, ENSURE ENLIVE, (ENSURE ENLIVE) LIQD Take 237 mLs by mouth 2 (two) times daily between meals. 237 mL 12  . FEROSUL 325 (65 Fe) MG tablet TAKE 2 TABLETS(650 MG) BY MOUTH EVERY OTHER DAY 90 tablet 0  . Multiple Vitamin (MULTIVITAMIN WITH MINERALS) TABS tablet Take 1 tablet by mouth daily. 30 tablet 0  . Potassium 99 MG TABS Take 99 mg by mouth daily.     No current facility-administered medications for this visit.    No Known Allergies  Social History   Socioeconomic History  . Marital status: Single    Spouse name: Not on file  . Number of children: Not on file  . Years of education: Not on file  . Highest education level: Not on file  Occupational History  . Occupation: retired  Tobacco Use  . Smoking status: Never Smoker  . Smokeless tobacco: Never Used  Substance and Sexual Activity  . Alcohol use: Not Currently    Alcohol/week: 0.0 standard drinks  . Drug use: Never  . Sexual activity: Not  on file  Other Topics Concern  . Not on file  Social History Narrative   Marital status: single; not dating in 2018; moved from [place] to be near sister; not happy in [place].  Lived [place] for 50 years.      Children: no children      Lives: alone with cat      Employment: retired; Optometrist with computers      Tobacco:       Alcohol:       ADLs; independent with ADLs; drives   Social Determinants of Radio broadcast assistant Strain:   .  Difficulty of Paying Living Expenses:   Food Insecurity:   . Worried About Charity fundraiser in the Last Year:   . Arboriculturist in the Last Year:   Transportation Needs:   . Film/video editor (Medical):   Marland Kitchen Lack of Transportation (Non-Medical):   Physical Activity:   . Days of Exercise per Week:   . Minutes of Exercise per Session:   Stress:   . Feeling of Stress :   Social Connections:   . Frequency of Communication with Friends and Family:   . Frequency of Social Gatherings with Friends and Family:   . Attends Religious Services:   . Active Member of Clubs or Organizations:   . Attends Archivist Meetings:   Marland Kitchen Marital Status:   Intimate Partner Violence:   . Fear of Current or Ex-Partner:   . Emotionally Abused:   Marland Kitchen Physically Abused:   . Sexually Abused:     Review of Systems  Constitutional: Negative.  Negative for chills and fever.  HENT: Negative.  Negative for congestion and sore throat.   Respiratory: Negative.  Negative for cough and shortness of breath.   Cardiovascular: Negative.  Negative for chest pain.  Gastrointestinal: Negative for abdominal pain, diarrhea, nausea and vomiting.  Genitourinary: Negative.  Negative for hematuria.  Musculoskeletal: Positive for back pain and joint pain.  Skin: Negative.  Negative for rash.  Neurological: Negative for dizziness and headaches.  All other systems reviewed and are negative.   Objective  Alert and oriented x3 in no apparent respiratory distress. Vitals as reported by the patient: There were no vitals filed for this visit.  There are no diagnoses linked to this encounter. [name] was seen today for knee pain.  Diagnoses and all orders for this visit:  Bilateral chronic knee pain -     Ambulatory referral to Orthopedic Surgery     I discussed the assessment and treatment plan with the patient. The patient was provided an opportunity to ask questions and all were answered. The patient  agreed with the plan and demonstrated an understanding of the instructions.   The patient was advised to call back or seek an in-person evaluation if the symptoms worsen or if the condition fails to improve as anticipated.  I provided 15 minutes of non-face-to-face time during this encounter.  Horald Pollen, MD  Primary Care at [facility]

## 2019-11-01 NOTE — Patient Instructions (Signed)
° ° ° °  If you have lab work done today you will be contacted with your lab results within the next 2 weeks.  If you have not heard from us then please contact us. The fastest way to get your results is to register for My Chart. ° ° °IF you received an x-ray today, you will receive an invoice from [facility]. Please contact [facility] at [phone] with questions or concerns regarding your invoice.  ° °IF you received labwork today, you will receive an invoice from [facility]. Please contact [facility] at [phone] with questions or concerns regarding your invoice.  ° °Our billing staff will not be able to assist you with questions regarding bills from these companies. ° °You will be contacted with the lab results as soon as they are available. The fastest way to get your results is to activate your My Chart account. Instructions are located on the last page of this paperwork. If you have not heard from us regarding the results in 2 weeks, please contact this office. °  ° ° ° °

## 2019-12-28 ENCOUNTER — Telehealth: Payer: Self-pay | Admitting: Emergency Medicine

## 2019-12-28 NOTE — Telephone Encounter (Signed)
Spoke with pt and she told me of the x-rays that she will need a copy of and I told her that I will have them made for her and placed up front awaiting on pick-up

## 2019-12-28 NOTE — Telephone Encounter (Signed)
Pt called states she what a copy of her Xray a soon is possible she can go and see a DR for her  Arthritis.pleae Advice

## 2019-12-30 DIAGNOSIS — I10 Essential (primary) hypertension: Secondary | ICD-10-CM | POA: Diagnosis not present

## 2019-12-30 DIAGNOSIS — Z1379 Encounter for other screening for genetic and chromosomal anomalies: Secondary | ICD-10-CM | POA: Diagnosis not present

## 2019-12-30 DIAGNOSIS — Z8249 Family history of ischemic heart disease and other diseases of the circulatory system: Secondary | ICD-10-CM | POA: Diagnosis not present

## 2020-01-18 DIAGNOSIS — M25361 Other instability, right knee: Secondary | ICD-10-CM | POA: Diagnosis not present

## 2020-01-18 DIAGNOSIS — M25362 Other instability, left knee: Secondary | ICD-10-CM | POA: Diagnosis not present

## 2020-01-18 DIAGNOSIS — M170 Bilateral primary osteoarthritis of knee: Secondary | ICD-10-CM | POA: Diagnosis not present

## 2020-01-27 DIAGNOSIS — M170 Bilateral primary osteoarthritis of knee: Secondary | ICD-10-CM | POA: Diagnosis not present

## 2020-03-22 DIAGNOSIS — M19072 Primary osteoarthritis, left ankle and foot: Secondary | ICD-10-CM | POA: Diagnosis not present

## 2020-04-20 ENCOUNTER — Telehealth: Payer: Self-pay | Admitting: Nurse Practitioner

## 2020-04-20 NOTE — Telephone Encounter (Signed)
I return call answering service. . Patient has hx of diverticular bleeding ( last time seems to be Aug 2020). Required 3 units of blood. Last colonoscopy Nov 2020 with findings of blood in colon, diffuse diverticulosis and a couple of small polyps.   Today she had a small amount of blood with BM but the stool was hard. Later on today she felt need to have a BM but only passed a few small blood clots. She feels a little lightheaded but is resting right now. Not on blood thinners  Cannot exclude recurrent diverticular bleed. Patient agrees to go to ED if she another episode of rectal bleeding today.

## 2020-04-26 ENCOUNTER — Encounter (HOSPITAL_COMMUNITY): Payer: Self-pay

## 2020-04-26 ENCOUNTER — Emergency Department (HOSPITAL_COMMUNITY): Payer: Medicare Other

## 2020-04-26 ENCOUNTER — Inpatient Hospital Stay (HOSPITAL_COMMUNITY)
Admission: EM | Admit: 2020-04-26 | Discharge: 2020-04-30 | DRG: 378 | Disposition: A | Discharge status: Home / Self Care | Payer: Medicare Other | Attending: Internal Medicine | Admitting: Internal Medicine

## 2020-04-26 ENCOUNTER — Inpatient Hospital Stay (HOSPITAL_COMMUNITY): Payer: Medicare Other

## 2020-04-26 ENCOUNTER — Other Ambulatory Visit: Payer: Self-pay

## 2020-04-26 DIAGNOSIS — K921 Melena: Secondary | ICD-10-CM

## 2020-04-26 DIAGNOSIS — I10 Essential (primary) hypertension: Secondary | ICD-10-CM | POA: Diagnosis present

## 2020-04-26 DIAGNOSIS — D62 Acute posthemorrhagic anemia: Secondary | ICD-10-CM

## 2020-04-26 DIAGNOSIS — Z90710 Acquired absence of both cervix and uterus: Secondary | ICD-10-CM | POA: Diagnosis not present

## 2020-04-26 DIAGNOSIS — K922 Gastrointestinal hemorrhage, unspecified: Secondary | ICD-10-CM | POA: Diagnosis not present

## 2020-04-26 DIAGNOSIS — K219 Gastro-esophageal reflux disease without esophagitis: Secondary | ICD-10-CM | POA: Diagnosis present

## 2020-04-26 DIAGNOSIS — K5791 Diverticulosis of intestine, part unspecified, without perforation or abscess with bleeding: Secondary | ICD-10-CM | POA: Diagnosis not present

## 2020-04-26 DIAGNOSIS — E876 Hypokalemia: Secondary | ICD-10-CM | POA: Diagnosis present

## 2020-04-26 DIAGNOSIS — K5731 Diverticulosis of large intestine without perforation or abscess with bleeding: Secondary | ICD-10-CM

## 2020-04-26 DIAGNOSIS — Z20822 Contact with and (suspected) exposure to covid-19: Secondary | ICD-10-CM | POA: Diagnosis present

## 2020-04-26 DIAGNOSIS — K5730 Diverticulosis of large intestine without perforation or abscess without bleeding: Secondary | ICD-10-CM | POA: Diagnosis not present

## 2020-04-26 HISTORY — PX: IR US GUIDE VASC ACCESS RIGHT: IMG2390

## 2020-04-26 HISTORY — PX: IR ANGIOGRAM VISCERAL SELECTIVE: IMG657

## 2020-04-26 LAB — CBC
HCT: 29.5 % — ABNORMAL LOW (ref 36.0–46.0)
HCT: 26.8 % — ABNORMAL LOW (ref 36.0–46.0)
Hemoglobin: 10.4 g/dL — ABNORMAL LOW (ref 12.0–15.0)
Hemoglobin: 9.1 g/dL — ABNORMAL LOW (ref 12.0–15.0)
MCH: 26.3 pg (ref 26.0–34.0)
MCH: 25.4 pg — ABNORMAL LOW (ref 26.0–34.0)
MCHC: 35.3 g/dL (ref 30.0–36.0)
MCHC: 34 g/dL (ref 30.0–36.0)
MCV: 74.7 fL — ABNORMAL LOW (ref 80.0–100.0)
MCV: 74.9 fL — ABNORMAL LOW (ref 80.0–100.0)
Platelets: 212 10*3/uL (ref 150–400)
Platelets: 213 10*3/uL (ref 150–400)
RBC: 3.95 MIL/uL (ref 3.87–5.11)
RBC: 3.58 MIL/uL — ABNORMAL LOW (ref 3.87–5.11)
RDW: 14.6 % (ref 11.5–15.5)
RDW: 14.6 % (ref 11.5–15.5)
WBC: 4.6 10*3/uL (ref 4.0–10.5)
WBC: 4.3 10*3/uL (ref 4.0–10.5)
nRBC: 0 % (ref 0.0–0.2)
nRBC: 0 % (ref 0.0–0.2)

## 2020-04-26 LAB — COMPREHENSIVE METABOLIC PANEL
ALT: 14 U/L (ref 0–44)
AST: 28 U/L (ref 15–41)
Albumin: 4 g/dL (ref 3.5–5.0)
Alkaline Phosphatase: 73 U/L (ref 38–126)
Anion gap: 9 (ref 5–15)
BUN: 13 mg/dL (ref 8–23)
CO2: 28 mmol/L (ref 22–32)
Calcium: 9.9 mg/dL (ref 8.9–10.3)
Chloride: 102 mmol/L (ref 98–111)
Creatinine, Ser: 0.64 mg/dL (ref 0.44–1.00)
GFR, Estimated: >60 mL/min (ref >60)
Glucose, Bld: 106 mg/dL — ABNORMAL HIGH (ref 70–99)
Potassium: 3.3 mmol/L — ABNORMAL LOW (ref 3.5–5.1)
Sodium: 139 mmol/L (ref 135–145)
Total Bilirubin: 0.5 mg/dL (ref 0.3–1.2)
Total Protein: 6.9 g/dL (ref 6.5–8.1)

## 2020-04-26 LAB — RESP PANEL BY RT PCR (RSV, FLU A&B, COVID)
Influenza A by PCR: NEGATIVE
Influenza B by PCR: NEGATIVE
Respiratory Syncytial Virus by PCR: NEGATIVE
SARS Coronavirus 2 by RT PCR: NEGATIVE

## 2020-04-26 LAB — PROTIME-INR
INR: 1.1 (ref 0.8–1.2)
Prothrombin Time: 13.4 seconds (ref 11.4–15.2)

## 2020-04-26 LAB — MAGNESIUM: Magnesium: 1.9 mg/dL (ref 1.7–2.4)

## 2020-04-26 LAB — POC OCCULT BLOOD, ED: Fecal Occult Bld: POSITIVE — AB

## 2020-04-26 MED ORDER — FLUMAZENIL 0.5 MG/5ML IV SOLN
INTRAVENOUS | Status: AC
  Filled 2020-04-26: qty 5

## 2020-04-26 MED ORDER — MIDAZOLAM HCL 2 MG/2ML IJ SOLN
INTRAMUSCULAR | Status: AC | PRN
  Administered 2020-04-26: 1 mg via INTRAVENOUS

## 2020-04-26 MED ORDER — SODIUM CHLORIDE 0.9 % IV SOLN: INTRAVENOUS | Status: DC

## 2020-04-26 MED ORDER — IOHEXOL 350 MG/ML SOLN
100.0000 mL | Freq: Once | INTRAVENOUS | Status: AC | PRN
  Administered 2020-04-26: 100 mL via INTRAVENOUS

## 2020-04-26 MED ORDER — ONDANSETRON HCL 4 MG/2ML IJ SOLN: 4.0000 mg | Freq: Four times a day (QID) | INTRAMUSCULAR | Status: DC | PRN

## 2020-04-26 MED ORDER — MIDAZOLAM HCL 2 MG/2ML IJ SOLN
INTRAMUSCULAR | Status: AC
  Filled 2020-04-26: qty 2

## 2020-04-26 MED ORDER — ONDANSETRON HCL 4 MG PO TABS: 4.0000 mg | ORAL_TABLET | Freq: Four times a day (QID) | ORAL | Status: DC | PRN

## 2020-04-26 MED ORDER — AMLODIPINE BESYLATE 5 MG PO TABS
10.0000 mg | ORAL_TABLET | Freq: Every day | ORAL | Status: DC
  Filled 2020-04-26: qty 2

## 2020-04-26 MED ORDER — FENTANYL CITRATE (PF) 100 MCG/2ML IJ SOLN
INTRAMUSCULAR | Status: AC | PRN
Start: 2020-04-26 — End: 2020-04-26
  Administered 2020-04-26: 25 ug via INTRAVENOUS

## 2020-04-26 MED ORDER — IOHEXOL 300 MG/ML  SOLN
100.0000 mL | Freq: Once | INTRAMUSCULAR | Status: AC | PRN
  Administered 2020-04-26: 30 mL via INTRA_ARTERIAL

## 2020-04-26 MED ORDER — IOHEXOL 300 MG/ML  SOLN
100.0000 mL | Freq: Once | INTRAMUSCULAR | Status: AC | PRN
  Administered 2020-04-26: 100 mL via INTRA_ARTERIAL

## 2020-04-26 MED ORDER — CIPROFLOXACIN IN D5W 400 MG/200ML IV SOLN: 400.0000 mg | Freq: Once | INTRAVENOUS | Status: AC

## 2020-04-26 MED ORDER — SODIUM CHLORIDE 0.9 % IV SOLN
INTRAVENOUS | Status: AC | PRN
  Administered 2020-04-26: 400 mL via INTRAVENOUS

## 2020-04-26 MED ORDER — SODIUM CHLORIDE 0.9% FLUSH
3.0000 mL | Freq: Two times a day (BID) | INTRAVENOUS | Status: DC
  Administered 2020-04-26 – 2020-04-28 (×3): 3 mL via INTRAVENOUS

## 2020-04-26 MED ORDER — CIPROFLOXACIN IN D5W 400 MG/200ML IV SOLN
INTRAVENOUS | Status: AC
  Administered 2020-04-26: 400 mg via INTRAVENOUS
  Filled 2020-04-26: qty 200

## 2020-04-26 MED ORDER — IOHEXOL 300 MG/ML  SOLN
100.0000 mL | Freq: Once | INTRAMUSCULAR | Status: AC | PRN
  Administered 2020-04-26: 40 mL via INTRA_ARTERIAL

## 2020-04-26 MED ORDER — NALOXONE HCL 0.4 MG/ML IJ SOLN
INTRAMUSCULAR | Status: AC
  Filled 2020-04-26: qty 1

## 2020-04-26 MED ORDER — FERROUS SULFATE 325 (65 FE) MG PO TABS
325.0000 mg | ORAL_TABLET | Freq: Every day | ORAL | Status: DC
  Administered 2020-04-27 – 2020-04-30 (×4): 325 mg via ORAL
  Filled 2020-04-26 (×4): qty 1

## 2020-04-26 MED ORDER — LIDOCAINE HCL 1 % IJ SOLN
INTRAMUSCULAR | Status: AC
  Filled 2020-04-26: qty 20

## 2020-04-26 MED ORDER — LIDOCAINE HCL (PF) 1 % IJ SOLN
INTRAMUSCULAR | Status: AC | PRN
  Administered 2020-04-26: 10 mL via INTRADERMAL

## 2020-04-26 MED ORDER — FENTANYL CITRATE (PF) 100 MCG/2ML IJ SOLN
INTRAMUSCULAR | Status: AC
  Filled 2020-04-26: qty 2

## 2020-04-26 NOTE — H&P (Signed)
History and Physical    Merrell Rettinger JAS:[id number] DOB: 08-02-1935 DOA: 04/26/2020  PCP: Horald Pollen, MD  Patient coming from: Home  Chief Complaint: Rectal bleeding.   HPI: Latoya Bennett is a 84 y.o. female with medical history significant of HTN, diverticulosis. She reports 1 week of rectal bleeding. Last Friday she noticed a large amount of blood from her rectum both in the morning and again in the afternoon. She did not have any palpitations, CP, lightheadedness. She decided she would eat a clear liquid diet to help. She noticed that the blood stopped over the next two days. However, on Monday, she began seeing darker stools. This continued through today when she started seeing bright red blood again. She became concerned and came to the ED.   ED Course: Her Hgb was noted to be off her baseline. GI was called and recommended a CTA ab/pelvis as well as admission for observation. TRH was called for admission.   Review of Systems:  Denies CP, palpitations, N/V, syncopal episodes, lightheadedness, dizziness. Remainder of review of systems is otherwise negative for all not mentioned in HPI.   PMHx Past Medical History:  Diagnosis Date  . Arthritis   . Diverticulitis   . GERD (gastroesophageal reflux disease)   . GI bleed   . Hypertension     PSHx Past Surgical History:  Procedure Laterality Date  . ABDOMINAL HYSTERECTOMY    . COLONOSCOPY Left 02/15/2019   Procedure: COLONOSCOPY;  Surgeon: Lavena Bullion, DO;  Location: [place];  Service: Gastroenterology;  Laterality: Left;  . ESOPHAGOGASTRODUODENOSCOPY (EGD) WITH PROPOFOL Left 04/18/2018   Procedure: ESOPHAGOGASTRODUODENOSCOPY (EGD) WITH PROPOFOL;  Surgeon: Arta Silence, MD;  Location: [facility];  Service: Endoscopy;  Laterality: Left;    SocHx  reports that she has never smoked. She has never used smokeless tobacco. She reports previous alcohol use. She reports that she does not use  drugs.  No Known Allergies  FamHx Family History  Problem Relation Age of Onset  . Hypertension Sister   . Colon cancer Neg Hx   . Esophageal cancer Neg Hx     Prior to Admission medications   Medication Sig Start Date End Date Taking? Authorizing Provider  amLODipine (NORVASC) 10 MG tablet Take 1 tablet (10 mg total) by mouth daily. 08/22/19 04/26/20 Yes Sagardia, Ines Bloomer, MD  FEROSUL 325 (65 Fe) MG tablet TAKE 2 TABLETS(650 MG) BY MOUTH EVERY OTHER DAY Patient taking differently: Take 325 mg by mouth daily with breakfast.  08/01/19  Yes Cirigliano, Vito V, DO  Multiple Vitamin (MULTIVITAMIN WITH MINERALS) TABS tablet Take 1 tablet by mouth daily. 02/18/19  Yes Sheikh, [name], DO  Potassium 99 MG TABS Take 99 mg by mouth daily.   Yes [provider]  famotidine (PEPCID) 20 MG tablet Take 1 tablet (20 mg total) by mouth daily. Patient not taking: Reported on 04/26/2020 02/18/19   Raiford Noble Latif, DO  feeding supplement, ENSURE ENLIVE, (ENSURE ENLIVE) LIQD Take 237 mLs by mouth 2 (two) times daily between meals. Patient not taking: Reported on 04/26/2020 02/17/19   Raiford Noble [place], DO    Physical Exam: Vitals:   04/26/20 1255 04/26/20 1349 04/26/20 1424 04/26/20 1447  BP: (!) 156/80 (!) 149/72 (!) 186/92 (!) 165/79  Pulse: 74 80 100 94  Resp: 17 (!) 22 16 (!) 21  Temp:      TempSrc:      SpO2: 99% 100% 100% 100%  Weight:      Height:  General: 84 y.o. female resting in bed in NAD Eyes: PERRL, normal sclera ENMT: Nares patent w/o discharge, orophaynx clear, dentition normal, ears w/o discharge/lesions/ulcers Neck: Supple, trachea midline Cardiovascular: RRR, +S1, S2, no m/g/r, equal pulses throughout Respiratory: CTABL, no w/r/r, normal WOB GI: BS+, NDNT, no masses noted, no organomegaly noted MSK: No e/c/c Skin: No rashes, bruises, ulcerations noted Neuro: A&O x 3, no focal deficits Psyc: Appropriate interaction and affect,  calm/cooperative  Labs on Admission: I have personally reviewed following labs and imaging studies  CBC: Recent Labs  Lab 04/26/20 1227  WBC 4.6  HGB 10.4*  HCT 29.5*  MCV 74.7*  PLT 175   Basic Metabolic Panel: Recent Labs  Lab 04/26/20 1227  NA 139  K 3.3*  CL 102  CO2 28  GLUCOSE 106*  BUN 13  CREATININE 0.64  CALCIUM 9.9   GFR: Estimated Creatinine Clearance: 41.6 mL/min (by C-G formula based on SCr of 0.64 mg/dL). Liver Function Tests: Recent Labs  Lab 04/26/20 1227  AST 28  ALT 14  ALKPHOS 73  BILITOT 0.5  PROT 6.9  ALBUMIN 4.0   No results for input(s): LIPASE, AMYLASE in the last 168 hours. No results for input(s): AMMONIA in the last 168 hours. Coagulation Profile: No results for input(s): INR, PROTIME in the last 168 hours. Cardiac Enzymes: No results for input(s): CKTOTAL, CKMB, CKMBINDEX, TROPONINI in the last 168 hours. BNP (last 3 results) No results for input(s): PROBNP in the last 8760 hours. HbA1C: No results for input(s): HGBA1C in the last 72 hours. CBG: No results for input(s): GLUCAP in the last 168 hours. Lipid Profile: No results for input(s): CHOL, HDL, LDLCALC, TRIG, CHOLHDL, LDLDIRECT in the last 72 hours. Thyroid Function Tests: No results for input(s): TSH, T4TOTAL, FREET4, T3FREE, THYROIDAB in the last 72 hours. Anemia Panel: No results for input(s): VITAMINB12, FOLATE, FERRITIN, TIBC, IRON, RETICCTPCT in the last 72 hours. Urine analysis:    Component Value Date/Time   COLORURINE STRAW (A) 04/14/2018 1521   APPEARANCEUR CLEAR 04/14/2018 1521   LABSPEC 1.015 04/14/2018 1521   PHURINE 7.0 04/14/2018 1521   GLUCOSEU NEGATIVE 04/14/2018 1521   HGBUR LARGE (A) 04/14/2018 1521   BILIRUBINUR NEGATIVE 04/14/2018 1521   BILIRUBINUR negative 11/08/2015 1258   KETONESUR NEGATIVE 04/14/2018 1521   PROTEINUR NEGATIVE 04/14/2018 1521   UROBILINOGEN 0.2 11/08/2015 1258   UROBILINOGEN 1.0 06/20/2009 1449   NITRITE NEGATIVE  04/14/2018 1521   LEUKOCYTESUR NEGATIVE 04/14/2018 1521    Radiological Exams on Admission: No results found.   Assessment/Plan GIB Acute hemorrhage of the distal transverse colon     - admit to inpatient, tele     - GI has seen and recommended CTA ab/pelvis     - notified by radiology that patient has acute bleed     - consulted IR and general surgery; IR will eval for embolization, gen surg to follow     - follow q6 H&H     - transfuse PRN     - NPO for now     - stat INR pending; no history of chronic anticoagulation  Hx of HTN     - BP ok; resume home BP meds in AM  Hypokalemia     - replace K+, check Mg2+  DVT prophylaxis: SCDs  Code Status: FULL  Family Communication: Spoke with dtr by phone.   Consults called: GI, IR, general surgery  Admission status: Inpatient.   Status is: Inpatient  Remains inpatient appropriate because:Inpatient  level of care appropriate due to severity of illness   Dispo: The patient is from: Home              Anticipated d/c is to: Home              Anticipated d/c date is: 2 days              Patient currently is not medically stable to d/c.  Jonnie Finner DO [facility]  If 7PM-7AM, please contact night-coverage [email or web address]  04/26/2020, 3:50 PM

## 2020-04-26 NOTE — ED Notes (Signed)
Pt to IR via stretcher

## 2020-04-26 NOTE — ED Provider Notes (Signed)
[facility] DEPT Provider Note   CSN: [id number] Arrival date & time: 04/26/20  1102     History Chief Complaint  Patient presents with  . Rectal Bleeding    Latoya Bennett is a 84 y.o. female.  The history is provided by the patient.  Rectal Bleeding Quality:  Maroon Amount:  Moderate Duration:  6 days Timing:  Constant Chronicity:  New Context: spontaneously   Similar prior episodes: yes   Relieved by:  Nothing Worsened by:  Nothing Associated symptoms: no abdominal pain, no dizziness, no epistaxis, no fever, no hematemesis, no light-headedness, no loss of consciousness, no recent illness and no vomiting   Risk factors comment:  Diverticular bleeds in the past      Past Medical History:  Diagnosis Date  . Arthritis   . Diverticulitis   . GERD (gastroesophageal reflux disease)   . GI bleed   . Hypertension     Patient Active Problem List   Diagnosis Date Noted  . Hypokalemia 02/12/2019  . GERD (gastroesophageal reflux disease) 02/12/2019  . Symptomatic anemia 02/12/2019  . GIB (gastrointestinal bleeding) 02/12/2019  . Diverticulosis of colon with hemorrhage   . Acute posthemorrhagic anemia   . Osteoarthritis of multiple joints 05/19/2018  . History of gout 05/19/2018  . Lower GI bleed 04/17/2018  . GI bleed 04/17/2018  . Chronic pain of both ankles 03/13/2017  . Osteoarthritis of both ankles 03/13/2017  . PAC (premature atrial contraction) 11/24/2016  . Essential hypertension 11/24/2016  . Primary osteoarthritis of left knee 11/24/2016  . Tachycardia 11/24/2016    Past Surgical History:  Procedure Laterality Date  . ABDOMINAL HYSTERECTOMY    . COLONOSCOPY Left 02/15/2019   Procedure: COLONOSCOPY;  Surgeon: Lavena Bullion, DO;  Location: [facility];  Service: Gastroenterology;  Laterality: Left;  . ESOPHAGOGASTRODUODENOSCOPY (EGD) WITH PROPOFOL Left 04/18/2018   Procedure: ESOPHAGOGASTRODUODENOSCOPY (EGD)  WITH PROPOFOL;  Surgeon: Arta Silence, MD;  [facility];  Service: Endoscopy;  Laterality: Left;     OB History   No obstetric history on file.     Family History  Problem Relation Age of Onset  . Hypertension Sister   . Colon cancer Neg Hx   . Esophageal cancer Neg Hx     Social History   Tobacco Use  . Smoking status: Never Smoker  . Smokeless tobacco: Never Used  Vaping Use  . Vaping Use: Never used  Substance Use Topics  . Alcohol use: Not Currently    Alcohol/week: 0.0 standard drinks  . Drug use: Never    Home Medications Prior to Admission medications   Medication Sig Start Date End Date Taking? Authorizing Provider  amLODipine (NORVASC) 10 MG tablet Take 1 tablet (10 mg total) by mouth daily. 08/22/19 11/20/19  Horald Pollen, MD  famotidine (PEPCID) 20 MG tablet Take 1 tablet (20 mg total) by mouth daily. 02/18/19   Raiford Noble Latif, DO  feeding supplement, ENSURE ENLIVE, (ENSURE ENLIVE) LIQD Take 237 mLs by mouth 2 (two) times daily between meals. 02/17/19   Sheikh, [name], DO  FEROSUL 325 (65 Fe) MG tablet TAKE 2 TABLETS(650 MG) BY MOUTH EVERY OTHER DAY 08/01/19   Cirigliano, Vito V, DO  Multiple Vitamin (MULTIVITAMIN WITH MINERALS) TABS tablet Take 1 tablet by mouth daily. 02/18/19   Raiford Noble Latif, DO  Potassium 99 MG TABS Take 99 mg by mouth daily.    [provider]    Allergies    Patient has no known  allergies.  Review of Systems   Review of Systems  Constitutional: Negative for chills and fever.  HENT: Negative for ear pain, nosebleeds and sore throat.   Eyes: Negative for pain and visual disturbance.  Respiratory: Negative for cough and shortness of breath.   Cardiovascular: Negative for chest pain and palpitations.  Gastrointestinal: Positive for blood in stool and hematochezia. Negative for abdominal pain, hematemesis and vomiting.  Genitourinary: Negative for dysuria and hematuria.  Musculoskeletal: Negative  for arthralgias and back pain.  Skin: Negative for color change and rash.  Neurological: Negative for dizziness, seizures, loss of consciousness, syncope and light-headedness.  All other systems reviewed and are negative.   Physical Exam Updated Vital Signs  ED Triage Vitals  Enc Vitals Group     BP 04/26/20 1111 136/74     Pulse Rate 04/26/20 1111 88     Resp 04/26/20 1111 16     Temp 04/26/20 1111 98.2 F (36.8 C)     Temp Source 04/26/20 1111 Oral     SpO2 04/26/20 1111 99 %     Weight 04/26/20 1112 125 lb (56.7 kg)     Height 04/26/20 1112 4\' 11"  (1.499 m)     Head Circumference --      Peak Flow --      Pain Score 04/26/20 1127 0     Pain Loc --      Pain Edu? --      Excl. in [place]? --     Physical Exam Vitals and nursing note reviewed.  Constitutional:      General: She is not in acute distress.    Appearance: She is well-developed. She is not ill-appearing.  HENT:     Head: Normocephalic and atraumatic.  Eyes:     Extraocular Movements: Extraocular movements intact.     Conjunctiva/sclera: Conjunctivae normal.     Pupils: Pupils are equal, round, and reactive to light.  Cardiovascular:     Rate and Rhythm: Normal rate and regular rhythm.     Pulses: Normal pulses.     Heart sounds: Normal heart sounds. No murmur heard.   Pulmonary:     Effort: Pulmonary effort is normal. No respiratory distress.     Breath sounds: Normal breath sounds.  Abdominal:     General: There is no distension.     Palpations: Abdomen is soft.     Tenderness: There is no abdominal tenderness.  Genitourinary:    Rectum: Guaiac result positive (maroon stool on exam).  Musculoskeletal:     Cervical back: Normal range of motion and neck supple.  Skin:    General: Skin is warm and dry.     Capillary Refill: Capillary refill takes less than 2 seconds.  Neurological:     General: No focal deficit present.     Mental Status: She is alert.     ED Results / Procedures / Treatments     Labs (all labs ordered are listed, but only abnormal results are displayed) Labs Reviewed  COMPREHENSIVE METABOLIC PANEL - Abnormal; Notable for the following components:      Result Value   Potassium 3.3 (*)    Glucose, Bld 106 (*)    All other components within normal limits  CBC - Abnormal; Notable for the following components:   Hemoglobin 10.4 (*)    HCT 29.5 (*)    MCV 74.7 (*)    All other components within normal limits  POC OCCULT BLOOD, ED - Abnormal; Notable for  the following components:   Fecal Occult Bld POSITIVE (*)    All other components within normal limits  TYPE AND SCREEN    EKG None  Radiology No results found.  Procedures Procedures (including critical care time)  Medications Ordered in ED Medications - No data to display  ED Course  I have reviewed the triage vital signs and the nursing notes.  Pertinent labs & imaging results that were available during my care of the patient were reviewed by me and considered in my medical decision making (see chart for details).    MDM Rules/Calculators/A&P                          Loretta Doutt is an 84 year old female with history of hypertension, diverticulitis who presents to the ED with rectal bleeding.  Also has known polyps.  Normal vitals.  No fever.  Has had bleeding from her rectum since Friday.  Started out as bright red blood and now more maroon-colored stools.  Denies any large-volume bloody bowel movements for the last several days however.  Has not had any abdominal pain, no nausea, no vomiting.  No abdominal tenderness on exam now.  Follows with [facility].  Patient is not on blood thinners.  Will check lab work.  Hemoccult is positive.  She has maroon stool on exam.  Suspect that this is likely diverticular bleed but has been ongoing for several days.  Will touch base with GI once I get lab work.  May consider imaging.  Hemoglobin is 10.4.  Otherwise no significant electrolyte abnormality,  kidney injury.  Talked with [facility] and they will come and evaluate the patient and we will get a CT abdomen and pelvis.  She appears hemodynamically stable.  Hemoglobin about 9 months ago was 13.  Does have a history of diverticular bleeds in the past requiring transfusion but it appears that she has been stable for the last several days.  Seems like she has not had any large bloody bowel movements over this last several days since bleeding started about 6 days ago.  They will evaluate the patient and give further recommendations about possible admission versus outpatient follow-up.  GI is recommending admission.  Will admit to medicine.  Hemodynamically stable.  No abdominal pain.  This chart was dictated using voice recognition software.  Despite best efforts to proofread,  errors can occur which can change the documentation meaning.    Final Clinical Impression(s) / ED Diagnoses Final diagnoses:  Gastrointestinal hemorrhage, unspecified gastrointestinal hemorrhage type    Rx / DC Orders ED Discharge Orders    None       Lennice Sites, DO 04/26/20 1357

## 2020-04-26 NOTE — Consult Note (Signed)
Chief Complaint: Patient was seen in consultation today for GI bleed/embolization.  Referring Physician(s): Jonnie Finner [facility])  Supervising Physician: Markus Daft  Patient Status: Lat[facility] - ED  History of Present Illness: [facility] is a 84 y.o. female with a past medical history of hypertension, GERD, diverticulitis, colon polyps, recurrent GI bleeding, and arthritis. She presented to [facility] this AM secondary to intermittent rectal bleeding x 6 days. Per notes, patient has had intermittent dark and maroon stools. She tried a clear liquid diet for 2 days with improvement, but had continued dark stools following more PO intake. In ED, hgb down 10.4 (from 13.9 in 07/2019). GI was consulted who recommended CTA for further evaluation, which revealed focal active hemorrhage within the distal transverse colon.  CTA abdomen/pelvis today: 1. Focal active hemorrhage within the distal transverse colon. 2.  Aortic Atherosclerosis (ICD10-I70.0). 3. Diverticulosis without evidence of diverticulitis.  IR consulted by Dr. Marylyn Ishihara for possible image-guided mesenteric arteriogram with possible embolization. Patient awake and alert laying in bed with no complaints at this time. Denies fever, chills, chest pain, dyspnea, abdominal pain, or headache.   Past Medical History:  Diagnosis Date  . Arthritis   . Diverticulitis   . GERD (gastroesophageal reflux disease)   . GI bleed   . Hypertension     Past Surgical History:  Procedure Laterality Date  . ABDOMINAL HYSTERECTOMY    . COLONOSCOPY Left 02/15/2019   Procedure: COLONOSCOPY;  Surgeon: Lavena Bullion, DO;  Location: [facility];  Service: Gastroenterology;  Laterality: Left;  . ESOPHAGOGASTRODUODENOSCOPY (EGD) WITH PROPOFOL Left 04/18/2018   Procedure: ESOPHAGOGASTRODUODENOSCOPY (EGD) WITH PROPOFOL;  Surgeon: Arta Silence, MD;  Location: [facility];  Service: Endoscopy;  Laterality: Left;    Allergies: Patient has no known  allergies.  Medications: Prior to Admission medications   Medication Sig Start Date End Date Taking? Authorizing Provider  amLODipine (NORVASC) 10 MG tablet Take 1 tablet (10 mg total) by mouth daily. 08/22/19 04/26/20 Yes Sagardia, Ines Bloomer, MD  FEROSUL 325 (65 Fe) MG tablet TAKE 2 TABLETS(650 MG) BY MOUTH EVERY OTHER DAY Patient taking differently: Take 325 mg by mouth daily with breakfast.  08/01/19  Yes Cirigliano, Vito V, DO  Multiple Vitamin (MULTIVITAMIN WITH MINERALS) TABS tablet Take 1 tablet by mouth daily. 02/18/19  Yes Sheikh, [name], DO  Potassium 99 MG TABS Take 99 mg by mouth daily.   Yes [provider]  famotidine (PEPCID) 20 MG tablet Take 1 tablet (20 mg total) by mouth daily. Patient not taking: Reported on 04/26/2020 02/18/19   Raiford Noble Latif, DO  feeding supplement, ENSURE ENLIVE, (ENSURE ENLIVE) LIQD Take 237 mLs by mouth 2 (two) times daily between meals. Patient not taking: Reported on 04/26/2020 02/17/19   Kerney Elbe, DO     Family History  Problem Relation Age of Onset  . Hypertension Sister   . Colon cancer Neg Hx   . Esophageal cancer Neg Hx     Social History   Socioeconomic History  . Marital status: Single    Spouse name: Not on file  . Number of children: Not on file  . Years of education: Not on file  . Highest education level: Not on file  Occupational History  . Occupation: retired  Tobacco Use  . Smoking status: Never Smoker  . Smokeless tobacco: Never Used  Vaping Use  . Vaping Use: Never used  Substance and Sexual Activity  . Alcohol use: Not Currently    Alcohol/week:  0.0 standard drinks  . Drug use: Never  . Sexual activity: Not on file  Other Topics Concern  . Not on file  Social History Narrative   Marital status: single; not dating in 2018; moved from [place] to be near sister; not happy in [place].  Lived [place] for 50 years.      Children: no children      Lives: alone with cat      Employment:  retired; Optometrist with computers      Tobacco:       Alcohol:       ADLs; independent with ADLs; drives   Social Determinants of Health   Financial Resource Strain:   . Difficulty of Paying Living Expenses: Not on file  Food Insecurity:   . Worried About Charity fundraiser in the Last Year: Not on file  . Ran Out of Food in the Last Year: Not on file  Transportation Needs:   . Lack of Transportation (Medical): Not on file  . Lack of Transportation (Non-Medical): Not on file  Physical Activity:   . Days of Exercise per Week: Not on file  . Minutes of Exercise per Session: Not on file  Stress:   . Feeling of Stress : Not on file  Social Connections:   . Frequency of Communication with Friends and Family: Not on file  . Frequency of Social Gatherings with Friends and Family: Not on file  . Attends Religious Services: Not on file  . Active Member of Clubs or Organizations: Not on file  . Attends Archivist Meetings: Not on file  . Marital Status: Not on file     Review of Systems: A 12 point ROS discussed and pertinent positives are indicated in the HPI above.  All other systems are negative.  Review of Systems  Constitutional: Negative for chills and fever.  Respiratory: Negative for shortness of breath and wheezing.   Cardiovascular: Negative for chest pain and palpitations.  Gastrointestinal: Positive for blood in stool. Negative for abdominal pain.  Neurological: Negative for headaches.  Psychiatric/Behavioral: Negative for behavioral problems and confusion.    Vital Signs: BP 134/70   Pulse 96   Temp 98.2 F (36.8 C) (Oral)   Resp 19   Ht 4\' 11"  (1.499 m)   Wt 125 lb (56.7 kg)   SpO2 100%   BMI 25.25 kg/m   Physical Exam Vitals and nursing note reviewed.  Constitutional:      General: She is not in acute distress.    Appearance: Normal appearance.  Cardiovascular:     Rate and Rhythm: Normal rate and regular rhythm.     Heart sounds:  Normal heart sounds. No murmur heard.   Pulmonary:     Effort: Pulmonary effort is normal.     Breath sounds: Normal breath sounds. No wheezing.  Skin:    General: Skin is warm and dry.  Neurological:     Mental Status: She is alert and oriented to person, place, and time.      MD Evaluation Airway: WNL Heart: WNL Abdomen: WNL Chest/ Lungs: WNL ASA  Classification: 3 Mallampati/Airway Score: Two   Imaging: CT Angio Abd/Pel w/ and/or w/o  Result Date: 04/26/2020 CLINICAL DATA:  84 year old female with history of diverticulosis and intestinal hemorrhage. EXAM: CTA ABDOMEN AND PELVIS WITHOUT AND WITH CONTRAST TECHNIQUE: Multidetector CT imaging of the abdomen and pelvis was performed using the standard protocol during bolus administration of intravenous contrast. Multiplanar reconstructed images and  MIPs were obtained and reviewed to evaluate the vascular anatomy. CONTRAST:  160mL OMNIPAQUE IOHEXOL 350 MG/ML SOLN COMPARISON:  None. 02/14/2019 FINDINGS: VASCULAR Aorta: Normal caliber and patent throughout. Scattered fibrofatty and calcific atherosclerotic changes most prominent in the distal segment. Celiac: Patent without evidence of aneurysm, dissection, vasculitis or significant stenosis. SMA: Patent without evidence of aneurysm, dissection, vasculitis or significant stenosis. Renals: Single bilateral renal arteries are patent without evidence of aneurysm, dissection, vasculitis, fibromuscular dysplasia or significant stenosis. IMA: Patent without evidence of aneurysm, dissection, vasculitis or significant stenosis. Inflow: Patent without evidence of aneurysm, dissection, vasculitis or significant stenosis. Proximal Outflow: Bilateral common femoral and visualized portions of the superficial and profunda femoral arteries are patent without evidence of aneurysm, dissection, vasculitis or significant stenosis. Veins: No obvious venous abnormality. Review of the MIP images confirms the above  findings. NON-VASCULAR Lower chest: Bibasilar subsegmental atelectasis. Heart is normal in size. No pericardial effusion. Hepatobiliary: The liver is normal in size, contour, and attenuation. No focal lesion. The gallbladder is present with layering sludge versus vicarious contrast excretion. No pericholecystic fluid, gallbladder wall thickening, or cholelithiasis. No intra or extrahepatic biliary ductal dilation. Pancreas: Coarse calcification within the pancreatic head. No pancreatic ductal dilatation or surrounding inflammatory changes. Spleen: Normal in size without focal abnormality. Adrenals/Urinary Tract: Adrenal glands are unremarkable. Kidneys are normal, without renal calculi, focal lesion, or hydronephrosis. Bladder is unremarkable. Stomach/Bowel: Focus of active extravasation the distal transverse colon. Stomach is within normal limits. Appendix appears normal. Scattered colonic diverticula. No evidence of bowel wall thickening, distention, or inflammatory changes. Lymphatic: No enlarged abdominal or pelvic lymph nodes. Reproductive: Status post hysterectomy. No adnexal masses. Other: No abdominal wall hernia or abnormality. No abdominopelvic ascites. Musculoskeletal: Multilevel degenerative changes of the visualized thoracolumbar spine. No acute osseous abnormality. IMPRESSION: VASCULAR 1. Focal active hemorrhage within the distal transverse colon. 2.  Aortic Atherosclerosis (ICD10-I70.0). NON-VASCULAR 1. Diverticulosis without evidence of diverticulitis. Recommend urgent Interventional Radiology consultation. These results were called by telephone at the time of interpretation on 04/26/2020 at 4:20 pm to provider Dr. Marylyn Ishihara, Who verbally acknowledged these results. [name], MD [facility] Radiology Electronically Signed   By: [name] MD   On: 04/26/2020 16:25    Labs:  CBC: Recent Labs    07/28/19 1528 04/26/20 1227  WBC 5.3 4.6  HGB  13.9 10.4*  HCT 41.2 29.5*  PLT 201.0 212    COAGS: No results for input(s): INR, APTT in the last 8760 hours.  BMP: Recent Labs    04/26/20 1227  NA 139  K 3.3*  CL 102  CO2 28  GLUCOSE 106*  BUN 13  CALCIUM 9.9  CREATININE 0.64  GFRNONAA >60    LIVER FUNCTION TESTS: Recent Labs    04/26/20 1227  BILITOT 0.5  AST 28  ALT 14  ALKPHOS 73  PROT 6.9  ALBUMIN 4.0     Assessment and Plan:  Rectal bleeding x 6 days, with CTA results revealing focal active hemorrhage within the distal transverse colon. Plan for image-guided mesenteric arteriogram with possible embolization today in IR. Patient is NPO. Afebrile and WBCs WNL. She does not take blood thinners. INR pending.  Risks and benefits of mesenteric arteriogram with intervention were discussed with the patient including, but not limited to bleeding, infection, vascular injury, contrast induced renal failure, stroke, reperfusion hemorrhage, or even death. This interventional procedure involves the use of X-rays and because of the nature of the planned procedure, it  is possible that we will have prolonged use of X-ray fluoroscopy. Potential radiation risks to you include (but are not limited to) the following: - A slightly elevated risk for cancer  several years later in life. This risk is typically less than 0.5% percent. This risk is low in comparison to the normal incidence of human cancer, which is 33% for women and 50% for men according to the [place]. - Radiation induced injury can include skin redness, resembling a rash, tissue breakdown / ulcers and hair loss (which can be temporary or permanent).  The likelihood of either of these occurring depends on the difficulty of the procedure and whether you are sensitive to radiation due to previous procedures, disease, or genetic conditions.  IF your procedure requires a prolonged use of radiation, you will be notified and given written instructions for  further action.  It is your responsibility to monitor the irradiated area for the 2 weeks following the procedure and to notify your physician if you are concerned that you have suffered a radiation induced injury.   All of the patient's questions were answered, patient is agreeable to proceed. Consent signed and in IR control room.   Thank you for this interesting consult.  I greatly enjoyed meeting [facility] and look forward to participating in their care.  A copy of this report was sent to the requesting provider on this date.  Electronically Signed: Earley Abide, PA-C 04/26/2020, 5:01 PM   I spent a total of 40 Minutes in face to face in clinical consultation, greater than 50% of which was counseling/coordinating care for GI bleed/embolization.

## 2020-04-26 NOTE — ED Triage Notes (Signed)
Patient presents with bleeding in the stool that started Friday. For two days patient states they have had dark stools. Then today it is bright red blood in her stool. She has had the same thing happen before and had a colonoscopy, they saw polyps and gave her iron to take for 3 months. Patient feels light headed with activity. Patient denies cramps, abdominal pain, nausea or vomiting.

## 2020-04-26 NOTE — Consult Note (Addendum)
Referring Provider: Dr. Ronnald Nian Primary Care Physician:  Horald Pollen, MD Primary Gastroenterologist:  Dr. Bryan Lemma   Reason for Consultation: Painless hematochezia, anemia   HPI: Latoya Bennett is a 85 y.o. female with a past medical history of arthritis, hypertension, gout, diverticulosis, recurrent GI bleed, presumed diverticular bleed 02/2019, 04/2018 and 09/2005.  Friday 10/15 she passed a darker formed stool in the morning.  Approximately 1 hour later she passed a moderate amount of dark red blood per the rectum x2 episodes.  At 5 PM, she passed a larger volume of dark red blood.  No upper or lower abdominal pain.  No rectal pain.  She went on a clear liquid diet for the next 2 days without any further rectal bleeding.  She resumed eating a soft diet on Monday 10/18 and Tuesday without any rectal bleeding.  Wednesday 10/20 she ate a heavier meal.  She awakened this morning around 4:56 AM and she passed a dark almost black solid stool without any blood.  She drank orange juice around 10 AM and shortly after she passed a moderate amount of bright red blood per the rectum.  Due to her prior history of diverticular bleed, she presented to the ED for further evaluation.  Labs in the ED showed a hemoglobin of 10.4 (baseline hemoglobin level 13.9 on 07/28/2019).  CMP was normal.  FOBT was positive.  Abdominal/pelvic CT angiogram was ordered by the ER physician.  A GI consult was requested for further evaluation.  She is hemodynamically stable.  She denies having any abdominal pain.  She has increased burping at times.  No dysphagia or heartburn.  No chest pain, palpitations, dizziness or shortness of breath.  Sh denies taking any aspirin or other NSAIDs.  Her appetite has been decreased over the past year.  She reports losing 10 to 20 pounds since her last hospitalization 02/2019.  No fever, sweats or chills.  She lives alone.  She has an older sister who lives nearby.  Her most recent  hospital admission for lower GI bleed was 02/11/2019. At that time her admission hemoglobin was 8.7 down from 10.8.  She continued to have active bleeding with a hemoglobin level dropped to 6.2.  She was transfused 5 units of PRBCs during this admission. A CT angiogram was negative for active bleeding. Evidence of a atrophic pancreas with dilatation of the main pancreatic duct was also noted. She underwent a colonoscopy 02/15/2019 by  Dr. Bryan Lemma which showed diverticulosis throughout the entire colon, blood was in the rectum, rectosigmoid and sigmoid colon and in the distal descending colon.  2 2 to 5 mm polyps in the transverse colon and ascending colon were identified but were not removed.  She underwent an abdominal MRI 03/19/2019 to follow up on the the CTAP which showed no dilatation of the main pancreatic duct, no substantial interval change was identified to the main pancreatic duct, the pancreatic parenchymal calcification seen on the previous CT was not evident on MRI but overall image features were compatible with sequela of chronic pancreatitis.   ED course: Sodium 139.  Potassium 3.3.  Chloride 102.  CO2 28.  Glucose 106.  BUN 13.  Creatinine 0.64.  Calcium 9.9.  Alk phos 73.  Albumin 4.0.  AST 28.  ALT 14.  Total bili 0.5.  WBC 4.6.  Hemoglobin 10.4.  Hematocrit 29.5.  MCV 74.7.  Platelet 212.   Colonoscopy by Dr. Bryan Lemma at [facility] due to hematochezia 02/15/2019: - Diverticulosis in the entire examined  colon. - Blood in the rectum, in the recto-sigmoid colon, in the sigmoid colon and in the distal  descending colon. - The distal rectum and anal verge are normal on retroflexion view. - Two 2 to 5 mm polyps in the transverse colon and in the ascending colon. - The examined portion of the ileum was normal. - No specimens collected.  Colonoscopy 09/15/2005 by Dr. Ardis Hughs due to hematochezia: Diverticulosis throughout the colon  EGD 04/18/2018 due to hematochezia, abnormal tagged RBC scan  by Dr. Paulita Fujita: - Small hiatal hernia. - Gastritis. - Normal duodenal bulb, first portion of the duodenum and second portion of the  duodenum. - No source of bleeding (current or recent or remote) in the upper GI tract.   Image studies:  Abdominal MRI 03/19/2019: 1. No substantial interval change in the diffuse irregular dilatation of the main pancreatic duct with associated diffuse pancreatic parenchymal atrophy. Pancreatic parenchymal calcification seen on previous CT scans is not readily evident by MRI today. Overall, imaging features remain compatible with sequelae of chronic pancreatitis.  Abdominal/Pelvic CT angiogram 02/14/2019: 1. No acute intra-abdominal or pelvic pathology. No CT evidence of bowel ischemia or active diverticular bleed. 2. Colonic diverticulosis. No bowel obstruction or active inflammation. 3. Atrophic pancreas with areas of dilatation of the main pancreatic duct similar to prior CT and may be sequela of chronic and recurrent pancreatitis. MRI may provide better evaluation of the pancreas if clinically indicated. 4. A 1 cm nodular density in the inferior left breast. Clinical correlation is recommended.  Tagged RBC scan 07/18/2017: showed bleedingactivity in distal stomach/proximal duodenum. Hgb 8.9, transfused 3 PRBC. On further review of tagged RBC study, IR felt that the bleed may have been in the proximal colon rather than small bowel.   Past Medical History:  Diagnosis Date  . Arthritis   . Diverticulitis   . GERD (gastroesophageal reflux disease)   . GI bleed   . Hypertension     Past Surgical History:  Procedure Laterality Date  . ABDOMINAL HYSTERECTOMY    . COLONOSCOPY Left 02/15/2019   Procedure: COLONOSCOPY;  Surgeon: Lavena Bullion, DO;  Location: [employer];  Service: Gastroenterology;  Laterality: Left;  . ESOPHAGOGASTRODUODENOSCOPY (EGD) WITH PROPOFOL Left 04/18/2018   Procedure: ESOPHAGOGASTRODUODENOSCOPY (EGD) WITH  PROPOFOL;  Surgeon: Arta Silence, MD;  Location: [facility];  Service: Endoscopy;  Laterality: Left;    Prior to Admission medications   Medication Sig Start Date End Date Taking? Authorizing Provider  amLODipine (NORVASC) 10 MG tablet Take 1 tablet (10 mg total) by mouth daily. 08/22/19 11/20/19  Horald Pollen, MD  famotidine (PEPCID) 20 MG tablet Take 1 tablet (20 mg total) by mouth daily. 02/18/19   Raiford Noble Latif, DO  feeding supplement, ENSURE ENLIVE, (ENSURE ENLIVE) LIQD Take 237 mLs by mouth 2 (two) times daily between meals. 02/17/19   Sheikh, [name], DO  FEROSUL 325 (65 Fe) MG tablet TAKE 2 TABLETS(650 MG) BY MOUTH EVERY OTHER DAY 08/01/19   Cirigliano, Vito V, DO  Multiple Vitamin (MULTIVITAMIN WITH MINERALS) TABS tablet Take 1 tablet by mouth daily. 02/18/19   Raiford Noble Latif, DO  Potassium 99 MG TABS Take 99 mg by mouth daily.    [provider]    No current facility-administered medications for this encounter.   Current Outpatient Medications  Medication Sig Dispense Refill  . amLODipine (NORVASC) 10 MG tablet Take 1 tablet (10 mg total) by mouth daily. 90 tablet 3  . famotidine (PEPCID) 20 MG tablet  Take 1 tablet (20 mg total) by mouth daily. 30 tablet 0  . feeding supplement, ENSURE ENLIVE, (ENSURE ENLIVE) LIQD Take 237 mLs by mouth 2 (two) times daily between meals. 237 mL 12  . FEROSUL 325 (65 Fe) MG tablet TAKE 2 TABLETS(650 MG) BY MOUTH EVERY OTHER DAY 90 tablet 0  . Multiple Vitamin (MULTIVITAMIN WITH MINERALS) TABS tablet Take 1 tablet by mouth daily. 30 tablet 0  . Potassium 99 MG TABS Take 99 mg by mouth daily.      Allergies as of 04/26/2020  . (No Known Allergies)    Family History  Problem Relation Age of Onset  . Hypertension Sister   . Colon cancer Neg Hx   . Esophageal cancer Neg Hx     Social History   Socioeconomic History  . Marital status: Single    Spouse name: Not on file  . Number of children: Not on file  .  Years of education: Not on file  . Highest education level: Not on file  Occupational History  . Occupation: retired  Tobacco Use  . Smoking status: Never Smoker  . Smokeless tobacco: Never Used  Vaping Use  . Vaping Use: Never used  Substance and Sexual Activity  . Alcohol use: Not Currently    Alcohol/week: 0.0 standard drinks  . Drug use: Never  . Sexual activity: Not on file  Other Topics Concern  . Not on file  Social History Narrative   Marital status: single; not dating in 2018; moved from [place] to be near sister; not happy in [place].  Lived [place] for 50 years.      Children: no children      Lives: alone with cat      Employment: retired; Optometrist with computers      Tobacco:       Alcohol:       ADLs; independent with ADLs; drives   Social Determinants of Health   Financial Resource Strain:   . Difficulty of Paying Living Expenses: Not on file  Food Insecurity:   . Worried About Charity fundraiser in the Last Year: Not on file  . Ran Out of Food in the Last Year: Not on file  Transportation Needs:   . Lack of Transportation (Medical): Not on file  . Lack of Transportation (Non-Medical): Not on file  Physical Activity:   . Days of Exercise per Week: Not on file  . Minutes of Exercise per Session: Not on file  Stress:   . Feeling of Stress : Not on file  Social Connections:   . Frequency of Communication with Friends and Family: Not on file  . Frequency of Social Gatherings with Friends and Family: Not on file  . Attends Religious Services: Not on file  . Active Member of Clubs or Organizations: Not on file  . Attends Archivist Meetings: Not on file  . Marital Status: Not on file  Intimate Partner Violence:   . Fear of Current or Ex-Partner: Not on file  . Emotionally Abused: Not on file  . Physically Abused: Not on file  . Sexually Abused: Not on file    Review of Systems: Gen: + weight loss.  CV: Denies chest pain, palpitations  or edema. Resp: Denies cough, shortness of breath of hemoptysis.  GI: See HPI.    GU : Denies urinary burning, blood in urine, increased urinary frequency or incontinence. MS: Denies joint pain, muscles aches or weakness. Derm: Denies rash, itchiness,  skin lesions or unhealing ulcers. Psych: Denies depression, anxiety or memory loss.  Heme: Denies easy bruising, bleeding. Neuro:  Denies headaches, dizziness or paresthesias. Endo:  Denies any problems with DM, thyroid or adrenal function.  Physical Exam: Vital signs in last 24 hours: Temp:  [98.2 F (36.8 C)] 98.2 F (36.8 C) (10/21 1111) Pulse Rate:  [74-88] 74 (10/21 1255) Resp:  [16-18] 17 (10/21 1255) BP: (136-156)/(69-80) 156/80 (10/21 1255) SpO2:  [98 %-99 %] 99 % (10/21 1255) Weight:  [56.7 kg] 56.7 kg (10/21 1127)   General:  Alert 84 year old female in NAD. Head:  Normocephalic and atraumatic. Eyes:  No scleral icterus. Conjunctiva pink. Ears:  Normal auditory acuity. Nose:  No deformity, discharge or lesions. Mouth: Upper and lower dentures intact.  No ulcers or lesions.  Neck:  Supple. No lymphadenopathy or thyromegaly.  Lungs: Breath sounds clear throughout. Heart: Regular rate and rhythm. Abdomen: Soft, nondistended.  Nontender.  Lower midline abdominal scar thickened.  No HSM.  Positive bowel sounds all 4 quadrants. Rectal: Small anterior external hemorrhoid without inflammation or evidence of recent bleeding.  Thick dark red blood in the rectal vault.  No melena or stool in the rectum. Musculoskeletal:  Symmetrical without gross deformities.  Pulses:  Normal pulses noted. Extremities:  Without clubbing or edema. Neurologic:  Alert and  oriented x4. No focal deficits.  Skin:  Intact without significant lesions or rashes. Psych:  Alert and cooperative. Normal mood and affect.  Intake/Output from previous day: No intake/output data recorded. Intake/Output this shift: No intake/output data recorded.  Lab  Results: Recent Labs    04/26/20 1227  WBC 4.6  HGB 10.4*  HCT 29.5*  PLT 212   BMET Recent Labs    04/26/20 1227  NA 139  K 3.3*  CL 102  CO2 28  GLUCOSE 106*  BUN 13  CREATININE 0.64  CALCIUM 9.9   LFT Recent Labs    04/26/20 1227  PROT 6.9  ALBUMIN 4.0  AST 28  ALT 14  ALKPHOS 73  BILITOT 0.5   PT/INR No results for input(s): LABPROT, INR in the last 72 hours. Hepatitis Panel No results for input(s): HEPBSAG, HCVAB, HEPAIGM, HEPBIGM in the last 72 hours.    Studies/Results: No results found.  IMPRESSION/PLAN:  57.  84 year old female with history of recurrent diverticular bleed presents with recurrence of painless hematochezia and questionable black solid stool with anemia.  Hemoglobin 10.4 (base line Hg 13.9 on 07/28/2019).  Abdominal/pelvic CT angiogram ordered, not yet completed. -N.p.o. for now -Await abdominal/pelvic CT angiogram results -IV fluids per the hospitalist -Monitor H&H closely  -Transfuse for hemoglobin less than 7 -Pantoprazole 40 mg IV every 24 hours -Most likely will require EGD and colonoscopy -Further recommendations per Dr. Loletha Carrow  2.  History of colon polyps per colonoscopy 02/2019 which were not removed in the setting of active GI bleeding -Timing of further endoscopic evaluation to be verified by Dr. Joseph Pierini Dorathy Daft  04/26/2020, 1:30 PM   I have reviewed the entire case in detail with the above APP and discussed the plan in detail.  Therefore, I agree with the diagnoses recorded above. In addition,  I have personally interviewed and examined the patient and have personally reviewed any abdominal/pelvic CT scan images.  My additional thoughts are as follows:  84 year old woman with several prior episodes of diverticular bleeding now presenting with hematochezia and acute blood loss anemia.  It sounds like it has been  going on at home for several days, but became more profuse bleeding today.  Her CT  angiogram is positive for bleeding in the distal transverse colon, and is most likely diverticular.  The internal medicine physician received the report and has communicated with the interventional radiology physician on-call to perform angiographic control of this bleeding source.  Therefore, no endoscopic procedures planned at present.  We will standby, and appreciate the help of her entire care team.   Nelida Meuse III [phone]

## 2020-04-26 NOTE — Consult Note (Signed)
Reason for Consult: GI bleed Referring Physician: Cherylann Ratel  [facility] is an 84 y.o. female.  HPI: 84 yo female with multiple previous episodes of diverticulosis with GI bleeding. She has had previous endoscopy with resolution. Today she had multiple bowel movements. She denies pain or anorexia. She denies blood thinners.  Past Medical History:  Diagnosis Date   Arthritis    Diverticulitis    GERD (gastroesophageal reflux disease)    GI bleed    Hypertension     Past Surgical History:  Procedure Laterality Date   ABDOMINAL HYSTERECTOMY     COLONOSCOPY Left 02/15/2019   Procedure: COLONOSCOPY;  Surgeon: Lavena Bullion, DO;  Location: [place];  Service: Gastroenterology;  Laterality: Left;   ESOPHAGOGASTRODUODENOSCOPY (EGD) WITH PROPOFOL Left 04/18/2018   Procedure: ESOPHAGOGASTRODUODENOSCOPY (EGD) WITH PROPOFOL;  Surgeon: Arta Silence, MD;  [facility];  Service: Endoscopy;  Laterality: Left;    Family History  Problem Relation Age of Onset   Hypertension Sister    Colon cancer Neg Hx    Esophageal cancer Neg Hx     Social History:  reports that she has never smoked. She has never used smokeless tobacco. She reports previous alcohol use. She reports that she does not use drugs.  Allergies: No Known Allergies  Medications: I have reviewed the patient's current medications.  Results for orders placed or performed during the hospital encounter of 04/26/20 (from the past 48 hour(s))  Comprehensive metabolic panel     Status: Abnormal   Collection Time: 04/26/20 12:27 PM  Result Value Ref Range   Sodium 139 135 - 145 mmol/L   Potassium 3.3 (L) 3.5 - 5.1 mmol/L   Chloride 102 98 - 111 mmol/L   CO2 28 22 - 32 mmol/L   Glucose, Bld 106 (H) 70 - 99 mg/dL    Comment: Glucose reference range applies only to samples taken after fasting for at least 8 hours.   BUN 13 8 - 23 mg/dL   Creatinine, Ser 0.64 0.44 - 1.00 mg/dL   Calcium  9.9 8.9 - 10.3 mg/dL   Total Protein 6.9 6.5 - 8.1 g/dL   Albumin 4.0 3.5 - 5.0 g/dL   AST 28 15 - 41 U/L   ALT 14 0 - 44 U/L   Alkaline Phosphatase 73 38 - 126 U/L   Total Bilirubin 0.5 0.3 - 1.2 mg/dL   GFR, Estimated >60 >60 mL/min    Comment: (NOTE) Calculated using the CKD-EPI Creatinine Equation (2021)    Anion gap 9 5 - 15    Comment: Performed at [facility], [place] [address] [place], [place] [address]  CBC     Status: Abnormal   Collection Time: 04/26/20 12:27 PM  Result Value Ref Range   WBC 4.6 4.0 - 10.5 K/uL   RBC 3.95 3.87 - 5.11 MIL/uL   Hemoglobin 10.4 (L) 12.0 - 15.0 g/dL   HCT 29.5 (L) 36 - 46 %   MCV 74.7 (L) 80.0 - 100.0 fL   MCH 26.3 26.0 - 34.0 pg   MCHC 35.3 30.0 - 36.0 g/dL   RDW 14.6 11.5 - 15.5 %   Platelets 212 150 - 400 K/uL   nRBC 0.0 0.0 - 0.2 %    Comment: Performed at [facility], [place] [address], [place], [place] [address]  Type and screen [place]     Status: None   Collection Time: 04/26/20 12:27 PM  Result Value Ref  Range   ABO/RH(D) O POS    Antibody Screen NEG    Sample Expiration      04/29/2020,2359 Performed at [facility], [place] [address] [place], [place] [address]   POC occult blood, ED     Status: Abnormal   Collection Time: 04/26/20 12:31 PM  Result Value Ref Range   Fecal Occult Bld POSITIVE (A) NEGATIVE  Protime-INR     Status: None   Collection Time: 04/26/20  4:43 PM  Result Value Ref Range   Prothrombin Time 13.4 11.4 - 15.2 seconds   INR 1.1 0.8 - 1.2    Comment: (NOTE) INR goal varies based on device and disease states. Performed at [facility], [place] [address], [place], [place] [address]     CT Angio Abd/Pel w/ and/or w/o  Result Date: 04/26/2020 CLINICAL DATA:  84 year old female with history of diverticulosis and intestinal hemorrhage. EXAM: CTA ABDOMEN AND PELVIS WITHOUT AND WITH CONTRAST TECHNIQUE:  Multidetector CT imaging of the abdomen and pelvis was performed using the standard protocol during bolus administration of intravenous contrast. Multiplanar reconstructed images and MIPs were obtained and reviewed to evaluate the vascular anatomy. CONTRAST:  166mL OMNIPAQUE IOHEXOL 350 MG/ML SOLN COMPARISON:  None. 02/14/2019 FINDINGS: VASCULAR Aorta: Normal caliber and patent throughout. Scattered fibrofatty and calcific atherosclerotic changes most prominent in the distal segment. Celiac: Patent without evidence of aneurysm, dissection, vasculitis or significant stenosis. SMA: Patent without evidence of aneurysm, dissection, vasculitis or significant stenosis. Renals: Single bilateral renal arteries are patent without evidence of aneurysm, dissection, vasculitis, fibromuscular dysplasia or significant stenosis. IMA: Patent without evidence of aneurysm, dissection, vasculitis or significant stenosis. Inflow: Patent without evidence of aneurysm, dissection, vasculitis or significant stenosis. Proximal Outflow: Bilateral common femoral and visualized portions of the superficial and profunda femoral arteries are patent without evidence of aneurysm, dissection, vasculitis or significant stenosis. Veins: No obvious venous abnormality. Review of the MIP images confirms the above findings. NON-VASCULAR Lower chest: Bibasilar subsegmental atelectasis. Heart is normal in size. No pericardial effusion. Hepatobiliary: The liver is normal in size, contour, and attenuation. No focal lesion. The gallbladder is present with layering sludge versus vicarious contrast excretion. No pericholecystic fluid, gallbladder wall thickening, or cholelithiasis. No intra or extrahepatic biliary ductal dilation. Pancreas: Coarse calcification within the pancreatic head. No pancreatic ductal dilatation or surrounding inflammatory changes. Spleen: Normal in size without focal abnormality. Adrenals/Urinary Tract: Adrenal glands are unremarkable.  Kidneys are normal, without renal calculi, focal lesion, or hydronephrosis. Bladder is unremarkable. Stomach/Bowel: Focus of active extravasation the distal transverse colon. Stomach is within normal limits. Appendix appears normal. Scattered colonic diverticula. No evidence of bowel wall thickening, distention, or inflammatory changes. Lymphatic: No enlarged abdominal or pelvic lymph nodes. Reproductive: Status post hysterectomy. No adnexal masses. Other: No abdominal wall hernia or abnormality. No abdominopelvic ascites. Musculoskeletal: Multilevel degenerative changes of the visualized thoracolumbar spine. No acute osseous abnormality. IMPRESSION: VASCULAR 1. Focal active hemorrhage within the distal transverse colon. 2.  Aortic Atherosclerosis (ICD10-I70.0). NON-VASCULAR 1. Diverticulosis without evidence of diverticulitis. Recommend urgent Interventional Radiology consultation. These results were called by telephone at the time of interpretation on 04/26/2020 at 4:20 pm to provider Dr. Marylyn Ishihara, Who verbally acknowledged these results. [name], MD [facility] Radiology Electronically Signed   By: [name] MD   On: 04/26/2020 16:25    Review of Systems  Constitutional: Negative for chills and fever.  HENT: Negative for hearing loss.   Eyes: Negative  for blurred vision and double vision.  Respiratory: Negative for cough and hemoptysis.   Cardiovascular: Negative for chest pain and palpitations.  Gastrointestinal: Positive for blood in stool. Negative for abdominal pain, nausea and vomiting.  Genitourinary: Negative for dysuria and urgency.  Musculoskeletal: Negative for myalgias and neck pain.  Skin: Negative for itching and rash.  Neurological: Negative for dizziness, tingling and headaches.  Endo/Heme/Allergies: Does not bruise/bleed easily.  Psychiatric/Behavioral: Negative for depression and suicidal ideas.    PE Blood pressure 129/75,  pulse 84, temperature 98.2 F (36.8 C), temperature source Oral, resp. rate 14, height 4\' 11"  (1.499 m), weight 56.7 kg, SpO2 100 %. Constitutional: NAD; conversant; no deformities Eyes: Moist conjunctiva; no lid lag; anicteric; PERRL Neck: Trachea midline; no thyromegaly Lungs: Normal respiratory effort; no tactile fremitus CV: RRR; no palpable thrills; no pitting edema GI: Abd soft, NT; no palpable hepatosplenomegaly MSK: unable to assess gait; no clubbing/cyanosis Psychiatric: Appropriate affect; alert and oriented x3 Lymphatic: No palpable cervical or axillary lymphadenopathy   Assessment/Plan: 84 yo female with diverticulosis with blood per rectum. Hgb 10 from 14. Hemodynamically stable. CT scan localizing to distal transverse colon. -discussed with patient the general goal of intervention with radiology and endoscopy and that the vast majority of the time that, supportive care, and time will end the bleeding. If bleeding is persistent or become massive then surgical intervention is last resort. Patient showed understanding.  Arta Bruce Katrena Stehlin 04/26/2020, 8:53 PM

## 2020-04-26 NOTE — ED Notes (Signed)
ED TO INPATIENT HANDOFF REPORT  Name/Age/Gender [facility] 84 y.o. female  Code Status    Code Status Orders  (From admission, onward)         Start     Ordered   04/26/20 1920  Full code  Continuous        04/26/20 1919        Code Status History    Date Active Date Inactive Code Status Order ID Comments User Context   02/12/2019 0344 02/17/2019 1708 Full Code [id number]  Ivor Costa, MD Inpatient   04/17/2018 1358 04/20/2018 2154 Full Code [id number]  Elodia Florence., MD Inpatient   Advance Care Planning Activity    Advance Directive Documentation     Most Recent Value  Type of Advance Directive Living will, Healthcare Power of Attorney  Pre-existing out of facility DNR order (yellow form or pink MOST form) --  "MOST" Form in Place? --      Home/SNF/Other Home  Chief Complaint GIB (gastrointestinal bleeding) [K92.2]  Level of Care/Admitting Diagnosis ED Disposition    ED Disposition Condition [place]: [place] [100102]  Level of Care: Telemetry [5]  Admit to tele based on following criteria: Monitor for Ischemic changes  May admit patient to [facility] or [facility] if equivalent level of care is available:: No  Covid Evaluation: Asymptomatic Screening Protocol (No Symptoms)  Diagnosis: GIB (gastrointestinal bleeding) [409735]  Admitting Physician: Jonnie Finner [[id number]]  Attending Physician: Jonnie Finner [[id number]]  Estimated length of stay: past midnight tomorrow  Certification:: I certify this patient will need inpatient services for at least 2 midnights       Medical History Past Medical History:  Diagnosis Date  . Arthritis   . Diverticulitis   . GERD (gastroesophageal reflux disease)   . GI bleed   . Hypertension     Allergies No Known Allergies  IV Location/Drains/Wounds Patient Lines/Drains/Airways Status    Active Line/Drains/Airways    Name Placement date Placement time  Site Days   Peripheral IV 04/26/20 Left;Distal Forearm 04/26/20  1356  Forearm  less than 1   Peripheral IV 04/26/20 Anterior;Left;Proximal Forearm 04/26/20  1415  Forearm  less than 1   Urethral Catheter S Catu, RN Latex;Straight-tip 14 Fr. 04/26/20  1800  Latex;Straight-tip  less than 1          Labs/Imaging Results for orders placed or performed during the hospital encounter of 04/26/20 (from the past 48 hour(s))  Comprehensive metabolic panel     Status: Abnormal   Collection Time: 04/26/20 12:27 PM  Result Value Ref Range   Sodium 139 135 - 145 mmol/L   Potassium 3.3 (L) 3.5 - 5.1 mmol/L   Chloride 102 98 - 111 mmol/L   CO2 28 22 - 32 mmol/L   Glucose, Bld 106 (H) 70 - 99 mg/dL    Comment: Glucose reference range applies only to samples taken after fasting for at least 8 hours.   BUN 13 8 - 23 mg/dL   Creatinine, Ser 0.64 0.44 - 1.00 mg/dL   Calcium 9.9 8.9 - 10.3 mg/dL   Total Protein 6.9 6.5 - 8.1 g/dL   Albumin 4.0 3.5 - 5.0 g/dL   AST 28 15 - 41 U/L   ALT 14 0 - 44 U/L   Alkaline Phosphatase 73 38 - 126 U/L   Total Bilirubin 0.5 0.3 - 1.2 mg/dL   GFR, Estimated >60 >60 mL/min  Comment: (NOTE) Calculated using the CKD-EPI Creatinine Equation (2021)    Anion gap 9 5 - 15    Comment: Performed at [facility], [place] [address], [place], [place] [address]  CBC     Status: Abnormal   Collection Time: 04/26/20 12:27 PM  Result Value Ref Range   WBC 4.6 4.0 - 10.5 K/uL   RBC 3.95 3.87 - 5.11 MIL/uL   Hemoglobin 10.4 (L) 12.0 - 15.0 g/dL   HCT 29.5 (L) 36 - 46 %   MCV 74.7 (L) 80.0 - 100.0 fL   MCH 26.3 26.0 - 34.0 pg   MCHC 35.3 30.0 - 36.0 g/dL   RDW 14.6 11.5 - 15.5 %   Platelets 212 150 - 400 K/uL   nRBC 0.0 0.0 - 0.2 %    Comment: Performed at [facility], [place] [address] [place], [place] [address]  Type and screen [place]     Status: None   Collection Time: 04/26/20 12:27 PM  Result Value Ref Range    ABO/RH(D) O POS    Antibody Screen NEG    Sample Expiration      04/29/2020,2359 Performed at [facility], [place] [address], [place], [place] [address]   POC occult blood, ED     Status: Abnormal   Collection Time: 04/26/20 12:31 PM  Result Value Ref Range   Fecal Occult Bld POSITIVE (A) NEGATIVE  Protime-INR     Status: None   Collection Time: 04/26/20  4:43 PM  Result Value Ref Range   Prothrombin Time 13.4 11.4 - 15.2 seconds   INR 1.1 0.8 - 1.2    Comment: (NOTE) INR goal varies based on device and disease states. Performed at [facility], [place] [address] [place], [place] [address]    CT Angio Abd/Pel w/ and/or w/o  Result Date: 04/26/2020 CLINICAL DATA:  84 year old female with history of diverticulosis and intestinal hemorrhage. EXAM: CTA ABDOMEN AND PELVIS WITHOUT AND WITH CONTRAST TECHNIQUE: Multidetector CT imaging of the abdomen and pelvis was performed using the standard protocol during bolus administration of intravenous contrast. Multiplanar reconstructed images and MIPs were obtained and reviewed to evaluate the vascular anatomy. CONTRAST:  113mL OMNIPAQUE IOHEXOL 350 MG/ML SOLN COMPARISON:  None. 02/14/2019 FINDINGS: VASCULAR Aorta: Normal caliber and patent throughout. Scattered fibrofatty and calcific atherosclerotic changes most prominent in the distal segment. Celiac: Patent without evidence of aneurysm, dissection, vasculitis or significant stenosis. SMA: Patent without evidence of aneurysm, dissection, vasculitis or significant stenosis. Renals: Single bilateral renal arteries are patent without evidence of aneurysm, dissection, vasculitis, fibromuscular dysplasia or significant stenosis. IMA: Patent without evidence of aneurysm, dissection, vasculitis or significant stenosis. Inflow: Patent without evidence of aneurysm, dissection, vasculitis or significant stenosis. Proximal Outflow: Bilateral common femoral and visualized  portions of the superficial and profunda femoral arteries are patent without evidence of aneurysm, dissection, vasculitis or significant stenosis. Veins: No obvious venous abnormality. Review of the MIP images confirms the above findings. NON-VASCULAR Lower chest: Bibasilar subsegmental atelectasis. Heart is normal in size. No pericardial effusion. Hepatobiliary: The liver is normal in size, contour, and attenuation. No focal lesion. The gallbladder is present with layering sludge versus vicarious contrast excretion. No pericholecystic fluid, gallbladder wall thickening, or cholelithiasis. No intra or extrahepatic biliary ductal dilation. Pancreas: Coarse calcification within the pancreatic head. No pancreatic ductal dilatation or surrounding inflammatory changes. Spleen: Normal in size without focal abnormality. Adrenals/Urinary Tract: Adrenal glands are unremarkable. Kidneys are  normal, without renal calculi, focal lesion, or hydronephrosis. Bladder is unremarkable. Stomach/Bowel: Focus of active extravasation the distal transverse colon. Stomach is within normal limits. Appendix appears normal. Scattered colonic diverticula. No evidence of bowel wall thickening, distention, or inflammatory changes. Lymphatic: No enlarged abdominal or pelvic lymph nodes. Reproductive: Status post hysterectomy. No adnexal masses. Other: No abdominal wall hernia or abnormality. No abdominopelvic ascites. Musculoskeletal: Multilevel degenerative changes of the visualized thoracolumbar spine. No acute osseous abnormality. IMPRESSION: VASCULAR 1. Focal active hemorrhage within the distal transverse colon. 2.  Aortic Atherosclerosis (ICD10-I70.0). NON-VASCULAR 1. Diverticulosis without evidence of diverticulitis. Recommend urgent Interventional Radiology consultation. These results were called by telephone at the time of interpretation on 04/26/2020 at 4:20 pm to provider Dr. Marylyn Ishihara, Who verbally acknowledged these results. [name],  MD [facility] Radiology Electronically Signed   By: [name] MD   On: 04/26/2020 16:25    Pending Labs Unresulted Labs (From admission, onward)          Start     Ordered   04/27/20 0500  Comprehensive metabolic panel  Tomorrow morning,   R        04/26/20 1919   04/27/20 0500  CBC  Tomorrow morning,   R        04/26/20 1919   04/26/20 1949  Resp Panel by RT PCR (RSV, Flu A&B, Covid) - Nasopharyngeal Swab  Once,   STAT       Question Answer Comment  Is this test for diagnosis or screening Screening   Symptomatic for COVID-19 as defined by CDC No   Hospitalized for COVID-19 No   Admitted to ICU for COVID-19 No   Previously tested for COVID-19 Yes   Resident in a congregate (group) care setting Unknown   Employed in healthcare setting No   Pregnant No   Has patient completed COVID vaccination(s) (2 doses of Pfizer/Moderna 1 dose of [place]) Unknown      04/26/20 1948   04/26/20 1920  Magnesium  Once,   STAT        04/26/20 1919   04/26/20 1920  Hemoglobin and hematocrit, blood  Every 6 hours,   R (with STAT occurrences)      04/26/20 1919          Vitals/Pain Today's Vitals   04/26/20 1930 04/26/20 1935 04/26/20 1940 04/26/20 2013  BP: 131/68 126/67 (!) 143/67 (!) 114/94  Pulse:    81  Resp: 15 16 (!) 22 15  Temp:      TempSrc:      SpO2:    99%  Weight:      Height:      PainSc:        Isolation Precautions No active isolations  Medications Medications  amLODipine (NORVASC) tablet 10 mg (has no administration in time range)  ferrous sulfate tablet 325 mg (has no administration in time range)  sodium chloride flush (NS) 0.9 % injection 3 mL (has no administration in time range)  0.9 %  sodium chloride infusion (has no administration in time range)  ondansetron (ZOFRAN) tablet 4 mg (has no administration in time range)    Or  ondansetron (ZOFRAN) injection 4 mg (has no administration in time  range)  lidocaine (XYLOCAINE) 1 % (with pres) injection (has no administration in time range)  iohexol (OMNIPAQUE) 350 MG/ML injection 100 mL (100 mLs Intravenous Contrast Given 04/26/20 1505)  midazolam (VERSED) injection (1 mg Intravenous Given 04/26/20 1746)  fentaNYL (SUBLIMAZE) injection (25 mcg Intravenous Given 04/26/20 1746)  lidocaine (PF) (XYLOCAINE) 1 % injection (10 mLs Intradermal Given 04/26/20 1807)  iohexol (OMNIPAQUE) 300 MG/ML solution 100 mL (40 mLs Intra-arterial Contrast Given 04/26/20 1933)  iohexol (OMNIPAQUE) 300 MG/ML solution 100 mL (30 mLs Intra-arterial Contrast Given 04/26/20 1932)  iohexol (OMNIPAQUE) 300 MG/ML solution 100 mL (100 mLs Intra-arterial Contrast Given 04/26/20 1932)  ciprofloxacin (CIPRO) IVPB 400 mg (0 mg Intravenous Stopped 04/26/20 1912)  0.9 %  sodium chloride infusion (400 mLs Intravenous New Bag/Given 04/26/20 1750)  iohexol (OMNIPAQUE) 300 MG/ML solution 100 mL (30 mLs Intra-arterial Contrast Given 04/26/20 1931)    Mobility non-ambulatory

## 2020-04-26 NOTE — Procedures (Signed)
Interventional Radiology Procedure:   Indications: GI bleeding and source identified as distal transverse colon on CTA  Procedure: Mesenteric Arteriogram  Findings: SMA and IMA injections with attention to upper abdomen and transverse colon.    No active bleeding.  Unable to cannulate middle colic artery.  Complications: None  Contrast: 200 ml Omni 300     EBL: less than 20 ml  Plan: Bedrest 4 hours, keep foley while on bedrest.  Follow CBC.     Ismael Treptow R. Anselm Pancoast, MD  Pager: [phone]

## 2020-04-26 NOTE — ED Notes (Signed)
Pt's daughter called wanting to get an update from nurse. She left her name and telephone number for someone to reach out to her.Daughter name:Monica Cheek, #[phone].

## 2020-04-27 ENCOUNTER — Inpatient Hospital Stay (HOSPITAL_COMMUNITY): Payer: Medicare Other

## 2020-04-27 ENCOUNTER — Telehealth: Payer: Self-pay

## 2020-04-27 DIAGNOSIS — D62 Acute posthemorrhagic anemia: Secondary | ICD-10-CM | POA: Diagnosis not present

## 2020-04-27 DIAGNOSIS — K922 Gastrointestinal hemorrhage, unspecified: Secondary | ICD-10-CM | POA: Diagnosis not present

## 2020-04-27 DIAGNOSIS — K5731 Diverticulosis of large intestine without perforation or abscess with bleeding: Secondary | ICD-10-CM | POA: Diagnosis not present

## 2020-04-27 DIAGNOSIS — K921 Melena: Secondary | ICD-10-CM | POA: Diagnosis not present

## 2020-04-27 HISTORY — PX: IR ANGIOGRAM VISCERAL SELECTIVE: IMG657

## 2020-04-27 HISTORY — PX: IR ANGIOGRAM SELECTIVE EACH ADDITIONAL VESSEL: IMG667

## 2020-04-27 HISTORY — PX: IR US GUIDE VASC ACCESS RIGHT: IMG2390

## 2020-04-27 LAB — COMPREHENSIVE METABOLIC PANEL
ALT: 12 U/L (ref 0–44)
AST: 16 U/L (ref 15–41)
Albumin: 3 g/dL — ABNORMAL LOW (ref 3.5–5.0)
Alkaline Phosphatase: 57 U/L (ref 38–126)
Anion gap: 7 (ref 5–15)
BUN: 10 mg/dL (ref 8–23)
CO2: 25 mmol/L (ref 22–32)
Calcium: 9 mg/dL (ref 8.9–10.3)
Chloride: 107 mmol/L (ref 98–111)
Creatinine, Ser: 0.54 mg/dL (ref 0.44–1.00)
GFR, Estimated: >60 mL/min (ref >60)
Glucose, Bld: 107 mg/dL — ABNORMAL HIGH (ref 70–99)
Potassium: 3 mmol/L — ABNORMAL LOW (ref 3.5–5.1)
Sodium: 139 mmol/L (ref 135–145)
Total Bilirubin: 0.4 mg/dL (ref 0.3–1.2)
Total Protein: 5.1 g/dL — ABNORMAL LOW (ref 6.5–8.1)

## 2020-04-27 LAB — HEMOGLOBIN AND HEMATOCRIT, BLOOD
HCT: 23.7 % — ABNORMAL LOW (ref 36.0–46.0)
HCT: 22.9 % — ABNORMAL LOW (ref 36.0–46.0)
HCT: 24.7 % — ABNORMAL LOW (ref 36.0–46.0)
HCT: 22.4 % — ABNORMAL LOW (ref 36.0–46.0)
Hemoglobin: 8.1 g/dL — ABNORMAL LOW (ref 12.0–15.0)
Hemoglobin: 8 g/dL — ABNORMAL LOW (ref 12.0–15.0)
Hemoglobin: 8.5 g/dL — ABNORMAL LOW (ref 12.0–15.0)
Hemoglobin: 7.8 g/dL — ABNORMAL LOW (ref 12.0–15.0)

## 2020-04-27 LAB — MAGNESIUM: Magnesium: 2 mg/dL (ref 1.7–2.4)

## 2020-04-27 LAB — PREPARE RBC (CROSSMATCH)

## 2020-04-27 MED ORDER — IOHEXOL 300 MG/ML  SOLN
100.0000 mL | Freq: Once | INTRAMUSCULAR | Status: AC | PRN
  Administered 2020-04-27: 20 mL via INTRA_ARTERIAL

## 2020-04-27 MED ORDER — IOHEXOL 300 MG/ML  SOLN
100.0000 mL | Freq: Once | INTRAMUSCULAR | Status: AC | PRN
  Administered 2020-04-27: 40 mL via INTRA_ARTERIAL

## 2020-04-27 MED ORDER — MIDAZOLAM HCL 2 MG/2ML IJ SOLN
INTRAMUSCULAR | Status: AC | PRN
  Administered 2020-04-27 (×2): 0.5 mg via INTRAVENOUS

## 2020-04-27 MED ORDER — MIDAZOLAM HCL 2 MG/2ML IJ SOLN
INTRAMUSCULAR | Status: AC
  Filled 2020-04-27: qty 2

## 2020-04-27 MED ORDER — POTASSIUM CHLORIDE 10 MEQ/100ML IV SOLN
10.0000 meq | INTRAVENOUS | Status: AC
  Administered 2020-04-27 (×4): 10 meq via INTRAVENOUS
  Filled 2020-04-27 (×2): qty 100

## 2020-04-27 MED ORDER — FLUMAZENIL 0.5 MG/5ML IV SOLN
INTRAVENOUS | Status: AC
  Filled 2020-04-27: qty 5

## 2020-04-27 MED ORDER — FENTANYL CITRATE (PF) 100 MCG/2ML IJ SOLN
INTRAMUSCULAR | Status: AC
Start: 2020-04-27 — End: 2020-04-28
  Filled 2020-04-27: qty 2

## 2020-04-27 MED ORDER — SODIUM CHLORIDE 0.9% IV SOLUTION: Freq: Once | INTRAVENOUS | Status: AC

## 2020-04-27 MED ORDER — FENTANYL CITRATE (PF) 100 MCG/2ML IJ SOLN
INTRAMUSCULAR | Status: AC | PRN
Start: 2020-04-27 — End: 2020-04-27
  Administered 2020-04-27 (×2): 12.5 ug via INTRAVENOUS

## 2020-04-27 MED ORDER — LIDOCAINE-EPINEPHRINE 1 %-1:100000 IJ SOLN
INTRAMUSCULAR | Status: AC | PRN
  Administered 2020-04-27: 10 mL via INTRADERMAL

## 2020-04-27 MED ORDER — IOHEXOL 300 MG/ML  SOLN
100.0000 mL | Freq: Once | INTRAMUSCULAR | Status: AC | PRN
  Administered 2020-04-27: 18 mL via INTRA_ARTERIAL

## 2020-04-27 MED ORDER — NALOXONE HCL 0.4 MG/ML IJ SOLN
INTRAMUSCULAR | Status: AC
  Filled 2020-04-27: qty 1

## 2020-04-27 MED ORDER — LIDOCAINE-EPINEPHRINE 1 %-1:100000 IJ SOLN
INTRAMUSCULAR | Status: AC
  Filled 2020-04-27: qty 1

## 2020-04-27 MED ORDER — BOOST / RESOURCE BREEZE PO LIQD CUSTOM
1.0000 | Freq: Three times a day (TID) | ORAL | Status: DC
  Administered 2020-04-27 – 2020-04-29 (×5): 1 via ORAL

## 2020-04-27 NOTE — Telephone Encounter (Signed)
Noted, thank you

## 2020-04-27 NOTE — Telephone Encounter (Signed)
Just an FYI  Received call from [place] at [facility] radiology, Dr. Serafina Royals was requesting to speak with Dr. Loletha Carrow in regards to this patient. I made Sheril aware that Dr. Loletha Carrow is the hospital doctor this week and is not currently at the office. I asked if there was anything that I could help them with she states that Dr. Serafina Royals would like to speak with Dr. Loletha Carrow directly. Provided them with your pager number.

## 2020-04-27 NOTE — Progress Notes (Signed)
Referring Physician(s): Danis,H  Supervising Physician: Suttle,D  Patient Status:  [facility] - In-pt  Chief Complaint:  Rectal bleeding  Subjective: Pt had sig maroon colored stool this am; denies worsening abd pain,N/V; latest hgb 8(8.1); K 3.0; BP 107/58, HR 82, afebrile  Past Medical History:  Diagnosis Date  . Arthritis   . Diverticulitis   . GERD (gastroesophageal reflux disease)   . GI bleed   . Hypertension    Past Surgical History:  Procedure Laterality Date  . ABDOMINAL HYSTERECTOMY    . COLONOSCOPY Left 02/15/2019   Procedure: COLONOSCOPY;  Surgeon: Lavena Bullion, DO;  Location: [place];  Service: Gastroenterology;  Laterality: Left;  . ESOPHAGOGASTRODUODENOSCOPY (EGD) WITH PROPOFOL Left 04/18/2018   Procedure: ESOPHAGOGASTRODUODENOSCOPY (EGD) WITH PROPOFOL;  Surgeon: Arta Silence, MD;  Location: [facility];  Service: Endoscopy;  Laterality: Left;  . IR ANGIOGRAM VISCERAL SELECTIVE  04/26/2020  . IR ANGIOGRAM VISCERAL SELECTIVE  04/26/2020  . IR US GUIDE VASC ACCESS RIGHT  04/26/2020    Allergies: Patient has no known allergies.  Medications: Prior to Admission medications   Medication Sig Start Date End Date Taking? Authorizing Provider  amLODipine (NORVASC) 10 MG tablet Take 1 tablet (10 mg total) by mouth daily. 08/22/19 04/26/20 Yes Sagardia, Ines Bloomer, MD  FEROSUL 325 (65 Fe) MG tablet TAKE 2 TABLETS(650 MG) BY MOUTH EVERY OTHER DAY Patient taking differently: Take 325 mg by mouth daily with breakfast.  08/01/19  Yes Cirigliano, Vito V, DO  Multiple Vitamin (MULTIVITAMIN WITH MINERALS) TABS tablet Take 1 tablet by mouth daily. 02/18/19  Yes Sheikh, [name], DO  Potassium 99 MG TABS Take 99 mg by mouth daily.   Yes [provider]  famotidine (PEPCID) 20 MG tablet Take 1 tablet (20 mg total) by mouth daily. Patient not taking: Reported on 04/26/2020 02/18/19   Raiford Noble Latif, DO  feeding supplement, ENSURE ENLIVE, (ENSURE  ENLIVE) LIQD Take 237 mLs by mouth 2 (two) times daily between meals. Patient not taking: Reported on 04/26/2020 02/17/19   Raiford Noble Latif, DO     Vital Signs: BP (!) 107/58   Pulse 82   Temp 98.5 F (36.9 C) (Oral)   Resp 17   Ht 4\' 11"  (1.499 m)   Wt 125 lb (56.7 kg)   SpO2 99%   BMI 25.25 kg/m   Physical Exam awake/alert; chest- CTA bilat; heart- RRR; abd- soft,+BS, mildly tender epig region to palpation; prev rt CFA puncture site soft,clean, dry, NT, no hematoma; no LE edema  Imaging: IR Angiogram Visceral Selective  Result Date: 04/27/2020 INDICATION: 84 year old with GI bleeding. CTA demonstrated focal hemorrhage in the distal transverse colon region. Patient was consented for mesenteric arteriogram and possible embolization. EXAM: MESENTERIC ARTERIOGRAM: SMA AND IMA ULTRASOUND GUIDANCE FOR VASCULAR ACCESS MEDICATIONS: Ciprofloxacin 400 mg. The antibiotic was administered within 1 hour of the procedure ANESTHESIA/SEDATION: Moderate (conscious) sedation was employed during this procedure. A total of Versed 1.0 mg and Fentanyl 25 mcg was administered intravenously. Moderate Sedation Time: 98 minutes. The patient's level of consciousness and vital signs were monitored continuously by radiology nursing throughout the procedure under my direct supervision. CONTRAST:  200 mg Omnipaque 300 FLUOROSCOPY TIME:  Fluoroscopy Time: 25 minutes, 36 seconds, 388 COMPLICATIONS: None immediate. PROCEDURE: Informed consent was obtained from the patient following explanation of the procedure, risks, benefits and alternatives. The patient understands, agrees and consents for the procedure. All questions were addressed. A time out was performed prior to the initiation of  the procedure. Maximal barrier sterile technique utilized including caps, mask, sterile gowns, sterile gloves, large sterile drape, hand hygiene, and Betadine prep. Fluoroscopy demonstrated that the urinary bladder was massively distended  with contrast. Therefore, Foley catheter was performed prior to the procedure. Ciprofloxacin was given due to the Foley catheter placement. Ultrasound demonstrated a patent right common femoral artery. Ultrasound image was saved for documentation. Right groin was prepped and draped in sterile fashion. Skin was anesthetized using 1% lidocaine. Small incision was made. Using ultrasound guidance, 21 gauge needle was directed into the right common femoral artery and a micropuncture dilator set was placed. Five French vascular sheath was placed. A Sos catheter was used to cannulate the SMA. SMA arteriograms were performed. Multiple attempts were made to cannulate the middle colic artery using a microcatheter but this was unsuccessful. Attention was directed to the IMA. It was difficult to cannulate the IMA but it was eventually cannulated after the IMA was identified using a lateral projection and angiography. IMA injections were performed with attention to the left upper abdomen at the area of concern. No active bleeding was identified. The SMA was cannulated again using a RIM catheter. SMA arteriogram was performed. No active bleeding was identified. Again, attempted to cannulate the middle colic artery with a microcatheter but unable to cannulate the vessel. Five French catheter was removed over a Bentson wire. Angiography was performed through the right groin sheath. Right groin sheath was removed using an ExoSeal closure device. Right groin hemostasis at the end of the procedure. Bandage placed over the puncture site. FINDINGS: SMA is widely patent. No evidence for active bleeding. Attention was directed to the left upper quadrant in the distal transverse colon region. Middle colic artery appears to come off anteriorly but this could never be successfully cannulated. IMA is patent. Attention was directed to the left colic artery distribution. No significant filling of the distal transverse colon from the IMA  distribution. No evidence of active bleeding from the IMA. IMPRESSION: Angiography of the superior mesenteric artery and inferior mesenteric artery. No evidence for active bleeding. Electronically Signed   By: Markus Daft M.D.   On: 04/27/2020 08:02   IR Angiogram Visceral Selective  Result Date: 04/27/2020 INDICATION: 84 year old with GI bleeding. CTA demonstrated focal hemorrhage in the distal transverse colon region. Patient was consented for mesenteric arteriogram and possible embolization. EXAM: MESENTERIC ARTERIOGRAM: SMA AND IMA ULTRASOUND GUIDANCE FOR VASCULAR ACCESS MEDICATIONS: Ciprofloxacin 400 mg. The antibiotic was administered within 1 hour of the procedure ANESTHESIA/SEDATION: Moderate (conscious) sedation was employed during this procedure. A total of Versed 1.0 mg and Fentanyl 25 mcg was administered intravenously. Moderate Sedation Time: 98 minutes. The patient's level of consciousness and vital signs were monitored continuously by radiology nursing throughout the procedure under my direct supervision. CONTRAST:  200 mg Omnipaque 300 FLUOROSCOPY TIME:  Fluoroscopy Time: 25 minutes, 36 seconds, 062 COMPLICATIONS: None immediate. PROCEDURE: Informed consent was obtained from the patient following explanation of the procedure, risks, benefits and alternatives. The patient understands, agrees and consents for the procedure. All questions were addressed. A time out was performed prior to the initiation of the procedure. Maximal barrier sterile technique utilized including caps, mask, sterile gowns, sterile gloves, large sterile drape, hand hygiene, and Betadine prep. Fluoroscopy demonstrated that the urinary bladder was massively distended with contrast. Therefore, Foley catheter was performed prior to the procedure. Ciprofloxacin was given due to the Foley catheter placement. Ultrasound demonstrated a patent right common femoral artery. Ultrasound image was saved  for documentation. Right groin was  prepped and draped in sterile fashion. Skin was anesthetized using 1% lidocaine. Small incision was made. Using ultrasound guidance, 21 gauge needle was directed into the right common femoral artery and a micropuncture dilator set was placed. Five French vascular sheath was placed. A Sos catheter was used to cannulate the SMA. SMA arteriograms were performed. Multiple attempts were made to cannulate the middle colic artery using a microcatheter but this was unsuccessful. Attention was directed to the IMA. It was difficult to cannulate the IMA but it was eventually cannulated after the IMA was identified using a lateral projection and angiography. IMA injections were performed with attention to the left upper abdomen at the area of concern. No active bleeding was identified. The SMA was cannulated again using a RIM catheter. SMA arteriogram was performed. No active bleeding was identified. Again, attempted to cannulate the middle colic artery with a microcatheter but unable to cannulate the vessel. Five French catheter was removed over a Bentson wire. Angiography was performed through the right groin sheath. Right groin sheath was removed using an ExoSeal closure device. Right groin hemostasis at the end of the procedure. Bandage placed over the puncture site. FINDINGS: SMA is widely patent. No evidence for active bleeding. Attention was directed to the left upper quadrant in the distal transverse colon region. Middle colic artery appears to come off anteriorly but this could never be successfully cannulated. IMA is patent. Attention was directed to the left colic artery distribution. No significant filling of the distal transverse colon from the IMA distribution. No evidence of active bleeding from the IMA. IMPRESSION: Angiography of the superior mesenteric artery and inferior mesenteric artery. No evidence for active bleeding. Electronically Signed   By: Markus Daft M.D.   On: 04/27/2020 08:02   IR US Guide Vasc  Access Right  Result Date: 04/27/2020 INDICATION: 84 year old with GI bleeding. CTA demonstrated focal hemorrhage in the distal transverse colon region. Patient was consented for mesenteric arteriogram and possible embolization. EXAM: MESENTERIC ARTERIOGRAM: SMA AND IMA ULTRASOUND GUIDANCE FOR VASCULAR ACCESS MEDICATIONS: Ciprofloxacin 400 mg. The antibiotic was administered within 1 hour of the procedure ANESTHESIA/SEDATION: Moderate (conscious) sedation was employed during this procedure. A total of Versed 1.0 mg and Fentanyl 25 mcg was administered intravenously. Moderate Sedation Time: 98 minutes. The patient's level of consciousness and vital signs were monitored continuously by radiology nursing throughout the procedure under my direct supervision. CONTRAST:  200 mg Omnipaque 300 FLUOROSCOPY TIME:  Fluoroscopy Time: 25 minutes, 36 seconds, 409 COMPLICATIONS: None immediate. PROCEDURE: Informed consent was obtained from the patient following explanation of the procedure, risks, benefits and alternatives. The patient understands, agrees and consents for the procedure. All questions were addressed. A time out was performed prior to the initiation of the procedure. Maximal barrier sterile technique utilized including caps, mask, sterile gowns, sterile gloves, large sterile drape, hand hygiene, and Betadine prep. Fluoroscopy demonstrated that the urinary bladder was massively distended with contrast. Therefore, Foley catheter was performed prior to the procedure. Ciprofloxacin was given due to the Foley catheter placement. Ultrasound demonstrated a patent right common femoral artery. Ultrasound image was saved for documentation. Right groin was prepped and draped in sterile fashion. Skin was anesthetized using 1% lidocaine. Small incision was made. Using ultrasound guidance, 21 gauge needle was directed into the right common femoral artery and a micropuncture dilator set was placed. Five French vascular sheath  was placed. A Sos catheter was used to cannulate the SMA. SMA arteriograms were  performed. Multiple attempts were made to cannulate the middle colic artery using a microcatheter but this was unsuccessful. Attention was directed to the IMA. It was difficult to cannulate the IMA but it was eventually cannulated after the IMA was identified using a lateral projection and angiography. IMA injections were performed with attention to the left upper abdomen at the area of concern. No active bleeding was identified. The SMA was cannulated again using a RIM catheter. SMA arteriogram was performed. No active bleeding was identified. Again, attempted to cannulate the middle colic artery with a microcatheter but unable to cannulate the vessel. Five French catheter was removed over a Bentson wire. Angiography was performed through the right groin sheath. Right groin sheath was removed using an ExoSeal closure device. Right groin hemostasis at the end of the procedure. Bandage placed over the puncture site. FINDINGS: SMA is widely patent. No evidence for active bleeding. Attention was directed to the left upper quadrant in the distal transverse colon region. Middle colic artery appears to come off anteriorly but this could never be successfully cannulated. IMA is patent. Attention was directed to the left colic artery distribution. No significant filling of the distal transverse colon from the IMA distribution. No evidence of active bleeding from the IMA. IMPRESSION: Angiography of the superior mesenteric artery and inferior mesenteric artery. No evidence for active bleeding. Electronically Signed   By: Markus Daft M.D.   On: 04/27/2020 08:02   CT Angio Abd/Pel w/ and/or w/o  Result Date: 04/26/2020 CLINICAL DATA:  84 year old female with history of diverticulosis and intestinal hemorrhage. EXAM: CTA ABDOMEN AND PELVIS WITHOUT AND WITH CONTRAST TECHNIQUE: Multidetector CT imaging of the abdomen and pelvis was performed using  the standard protocol during bolus administration of intravenous contrast. Multiplanar reconstructed images and MIPs were obtained and reviewed to evaluate the vascular anatomy. CONTRAST:  126mL OMNIPAQUE IOHEXOL 350 MG/ML SOLN COMPARISON:  None. 02/14/2019 FINDINGS: VASCULAR Aorta: Normal caliber and patent throughout. Scattered fibrofatty and calcific atherosclerotic changes most prominent in the distal segment. Celiac: Patent without evidence of aneurysm, dissection, vasculitis or significant stenosis. SMA: Patent without evidence of aneurysm, dissection, vasculitis or significant stenosis. Renals: Single bilateral renal arteries are patent without evidence of aneurysm, dissection, vasculitis, fibromuscular dysplasia or significant stenosis. IMA: Patent without evidence of aneurysm, dissection, vasculitis or significant stenosis. Inflow: Patent without evidence of aneurysm, dissection, vasculitis or significant stenosis. Proximal Outflow: Bilateral common femoral and visualized portions of the superficial and profunda femoral arteries are patent without evidence of aneurysm, dissection, vasculitis or significant stenosis. Veins: No obvious venous abnormality. Review of the MIP images confirms the above findings. NON-VASCULAR Lower chest: Bibasilar subsegmental atelectasis. Heart is normal in size. No pericardial effusion. Hepatobiliary: The liver is normal in size, contour, and attenuation. No focal lesion. The gallbladder is present with layering sludge versus vicarious contrast excretion. No pericholecystic fluid, gallbladder wall thickening, or cholelithiasis. No intra or extrahepatic biliary ductal dilation. Pancreas: Coarse calcification within the pancreatic head. No pancreatic ductal dilatation or surrounding inflammatory changes. Spleen: Normal in size without focal abnormality. Adrenals/Urinary Tract: Adrenal glands are unremarkable. Kidneys are normal, without renal calculi, focal lesion, or  hydronephrosis. Bladder is unremarkable. Stomach/Bowel: Focus of active extravasation the distal transverse colon. Stomach is within normal limits. Appendix appears normal. Scattered colonic diverticula. No evidence of bowel wall thickening, distention, or inflammatory changes. Lymphatic: No enlarged abdominal or pelvic lymph nodes. Reproductive: Status post hysterectomy. No adnexal masses. Other: No abdominal wall hernia or abnormality. No abdominopelvic ascites. Musculoskeletal:  Multilevel degenerative changes of the visualized thoracolumbar spine. No acute osseous abnormality. IMPRESSION: VASCULAR 1. Focal active hemorrhage within the distal transverse colon. 2.  Aortic Atherosclerosis (ICD10-I70.0). NON-VASCULAR 1. Diverticulosis without evidence of diverticulitis. Recommend urgent Interventional Radiology consultation. These results were called by telephone at the time of interpretation on 04/26/2020 at 4:20 pm to provider Dr. Marylyn Ishihara, Who verbally acknowledged these results. [name], MD [facility] Radiology Electronically Signed   By: [name] MD   On: 04/26/2020 16:25    Labs:  CBC: Recent Labs    07/28/19 1528 07/28/19 1528 04/26/20 1227 04/26/20 2052 04/27/20 0111 04/27/20 0713  WBC 5.3  --  4.6 4.3  --   --   HGB 13.9   < > 10.4* 9.1* 8.1* 8.0*  HCT 41.2   < > 29.5* 26.8* 23.7* 22.9*  PLT 201.0  --  212 213  --   --    < > = values in this interval not displayed.    COAGS: Recent Labs    04/26/20 1643  INR 1.1    BMP: Recent Labs    04/26/20 1227 04/27/20 0107  NA 139 139  K 3.3* 3.0*  CL 102 107  CO2 28 25  GLUCOSE 106* 107*  BUN 13 10  CALCIUM 9.9 9.0  CREATININE 0.64 0.54  GFRNONAA >60 >60    LIVER FUNCTION TESTS: Recent Labs    04/26/20 1227 04/27/20 0107  BILITOT 0.5 0.4  AST 28 16  ALT 14 12  ALKPHOS 73 57  PROT 6.9 5.1*  ALBUMIN 4.0 3.0*    Assessment and Plan: Pt with hx HTN ,GERD,  diverticular disease, persistent GI bleeding, recent CTA abd/pelvis with focal hemorrhage in distal TV colon region; s/p visceral angio yesterday but no active bleeding noted; afebrile; hgb 8(8.1)- to receive transfusion, creat 0.54, K 3.0- replace; case reviewed by Drs. Danis/Suttle today- rec repeat visceral angio today to potentially catch active bleed and embolize if possible; plans d/w pt again and consent obtained   Electronically Signed: D. Rowe Robert, PA-C 04/27/2020, 11:36 AM   I spent a total of 20 minutes at the the patient's bedside AND on the patient's hospital floor or unit, greater than 50% of which was counseling/coordinating care for visceral arteriogram with possible embolization    [name], female   DOB: 09/18/1935, 84 y.o.   MRN: [id number]

## 2020-04-27 NOTE — Telephone Encounter (Signed)
DUPLICATE

## 2020-04-27 NOTE — Progress Notes (Signed)
Hospitalist on call notified of HGB drop from 9.1 (2052) to 8.1 (0111). Following H&H closely at this time. Roderick Pee

## 2020-04-27 NOTE — Progress Notes (Addendum)
PROGRESS NOTE    Luria Rosario  ZSW:[id number] DOB: 20-Mar-1936 DOA: 04/26/2020 PCP: Horald Pollen, MD   Chief Complain: Rectal bleeding  Brief Narrative: Patient is 84 year-old female with history of hypertension, diverticulosis who presents with complaint of 1 week history of rectal bleeding.  On presentation she was found to be anemic.  GI was consulted who recommended IR consult for possible embolization.  IR did CT angiogram with finding of source of bleeding from distal transverse colon but there was no active bleeding so no intervention done.  General surgery is also on board.  Hemoglobin this morning in the range of 8.  She had a large bloody bowel movement after my evaluation, now the plan is for CT guided angiogram again today.  Assessment & Plan:   Active Problems:   GIB (gastrointestinal bleeding)   Acute lower GI bleed: Diverticular bleed.  CT angiogram showed source of bleeding from distal transverse colon.  GI, surgery, IR following.  IR evaluated her for embolization but there was no active bleeding so intervention not done. Her baseline hemoglobin is in the range of 14, she presented with hemoglobin of 10 ,this morning hemoglobin is 8. She had a large bloody bowel movement after my evaluation, now the plan is for CT guided angiogram again today for possible embolisation.  She was also given a unit of PRBC.  We will continue monitor hemoglobin.  Hypertension: Blood pressure is currently stable.  Will hold antihypertensives at the moment.  Hypokalemia: Being supplemented with potassium.           DVT prophylaxis:SCD Code Status: Full Family Communication: Called sister on phone [phone] Status is: Inpatient  Remains inpatient appropriate because:Inpatient level of care appropriate due to severity of illness   Dispo: The patient is from: Home              Anticipated d/c is to: Home              Anticipated d/c date is: 2 days               Patient currently is not medically stable to d/c.     Consultants: GI, general surgery, IR  Procedures: CT angiogram  Antimicrobials:  Anti-infectives (From admission, onward)   Start     Dose/Rate Route Frequency Ordered Stop   04/26/20 1815  ciprofloxacin (CIPRO) IVPB 400 mg        400 mg 200 mL/hr over 60 Minutes Intravenous  Once 04/26/20 1826 04/26/20 1912      Subjective: Patient seen and examined at the bedside this morning.  During my evaluation, she was comfortable, denies any abdominal pain She was not having active bleeding.  After my evaluation this morning, I was notified that she had a big bowel movement which was bloody.  Objective: Vitals:   04/26/20 2015 04/26/20 2130 04/27/20 0134 04/27/20 0539  BP: 129/75 (!) 108/57 122/75 106/60  Pulse: 84 74 77 86  Resp: 14 20 16 20   Temp:  97.6 F (36.4 C) 98.4 F (36.9 C) 98.4 F (36.9 C)  TempSrc:  Oral Oral Oral  SpO2: 100% 100% 100% 100%  Weight:      Height:        Intake/Output Summary (Last 24 hours) at 04/27/2020 0820 Last data filed at 04/27/2020 0736 Gross per 24 hour  Intake 645.36 ml  Output 2000 ml  Net -1354.64 ml   Filed Weights   04/26/20 1112 04/26/20 1127  Weight: 56.7 kg 56.7  kg    Examination:  General exam: Appears calm and comfortable ,Not in distress,average built HEENT:PERRL,Oral mucosa moist, Ear/Nose normal on gross exam Respiratory system: Bilateral equal air entry, normal vesicular breath sounds, no wheezes or crackles  Cardiovascular system: S1 & S2 heard, RRR. No JVD, murmurs, rubs, gallops or clicks. No pedal edema. Gastrointestinal system: Abdomen is nondistended, soft and nontender. No organomegaly or masses felt. Normal bowel sounds heard. Central nervous system: Alert and oriented. No focal neurological deficits. Extremities: No edema, no clubbing ,no cyanosis, distal peripheral pulses palpable. Skin: No rashes, lesions or ulcers,no icterus ,no pallor MSK: Normal  muscle bulk,tone ,power Psychiatry: Judgement and insight appear normal. Mood & affect appropriate.     Data Reviewed: I have personally reviewed following labs and imaging studies  CBC: Recent Labs  Lab 04/26/20 1227 04/26/20 2052 04/27/20 0111 04/27/20 0713  WBC 4.6 4.3  --   --   HGB 10.4* 9.1* 8.1* 8.0*  HCT 29.5* 26.8* 23.7* 22.9*  MCV 74.7* 74.9*  --   --   PLT 212 213  --   --    Basic Metabolic Panel: Recent Labs  Lab 04/26/20 1227 04/26/20 2058 04/27/20 0107  NA 139  --  139  K 3.3*  --  3.0*  CL 102  --  107  CO2 28  --  25  GLUCOSE 106*  --  107*  BUN 13  --  10  CREATININE 0.64  --  0.54  CALCIUM 9.9  --  9.0  MG  --  1.9  --    GFR: Estimated Creatinine Clearance: 41.6 mL/min (by C-G formula based on SCr of 0.54 mg/dL). Liver Function Tests: Recent Labs  Lab 04/26/20 1227 04/27/20 0107  AST 28 16  ALT 14 12  ALKPHOS 73 57  BILITOT 0.5 0.4  PROT 6.9 5.1*  ALBUMIN 4.0 3.0*   No results for input(s): LIPASE, AMYLASE in the last 168 hours. No results for input(s): AMMONIA in the last 168 hours. Coagulation Profile: Recent Labs  Lab 04/26/20 1643  INR 1.1   Cardiac Enzymes: No results for input(s): CKTOTAL, CKMB, CKMBINDEX, TROPONINI in the last 168 hours. BNP (last 3 results) No results for input(s): PROBNP in the last 8760 hours. HbA1C: No results for input(s): HGBA1C in the last 72 hours. CBG: No results for input(s): GLUCAP in the last 168 hours. Lipid Profile: No results for input(s): CHOL, HDL, LDLCALC, TRIG, CHOLHDL, LDLDIRECT in the last 72 hours. Thyroid Function Tests: No results for input(s): TSH, T4TOTAL, FREET4, T3FREE, THYROIDAB in the last 72 hours. Anemia Panel: No results for input(s): VITAMINB12, FOLATE, FERRITIN, TIBC, IRON, RETICCTPCT in the last 72 hours. Sepsis Labs: No results for input(s): PROCALCITON, LATICACIDVEN in the last 168 hours.  Recent Results (from the past 240 hour(s))  Resp Panel by RT PCR (RSV,  Flu A&B, Covid) - Nasopharyngeal Swab     Status: None   Collection Time: 04/26/20  8:03 PM   Specimen: Nasopharyngeal Swab  Result Value Ref Range Status   SARS Coronavirus 2 by RT PCR NEGATIVE NEGATIVE Final    Comment: (NOTE) SARS-CoV-2 target nucleic acids are NOT DETECTED.  The SARS-CoV-2 RNA is generally detectable in upper respiratoy specimens during the acute phase of infection. The lowest concentration of SARS-CoV-2 viral copies this assay can detect is 131 copies/mL. A negative result does not preclude SARS-Cov-2 infection and should not be used as the sole basis for treatment or other patient management decisions. A negative  result may occur with  improper specimen collection/handling, submission of specimen other than nasopharyngeal swab, presence of viral mutation(s) within the areas targeted by this assay, and inadequate number of viral copies (<131 copies/mL). A negative result must be combined with clinical observations, patient history, and epidemiological information. The expected result is Negative.  Fact Sheet for Patients:  PinkCheek.be  Fact Sheet for Healthcare Providers:  GravelBags.it  This test is no t yet approved or cleared by the [place] FDA and  has been authorized for detection and/or diagnosis of SARS-CoV-2 by FDA under an Emergency Use Authorization (EUA). This EUA will remain  in effect (meaning this test can be used) for the duration of the COVID-19 declaration under Section 564(b)(1) of the Act, 21 U.S.C. section 360bbb-3(b)(1), unless the authorization is terminated or revoked sooner.     Influenza A by PCR NEGATIVE NEGATIVE Final   Influenza B by PCR NEGATIVE NEGATIVE Final    Comment: (NOTE) The Xpert Xpress SARS-CoV-2/FLU/RSV assay is intended as an aid in  the diagnosis of influenza from Nasopharyngeal swab specimens and  should not be used as a sole basis for treatment.  Nasal washings and  aspirates are unacceptable for Xpert Xpress SARS-CoV-2/FLU/RSV  testing.  Fact Sheet for Patients: PinkCheek.be  Fact Sheet for Healthcare Providers: GravelBags.it  This test is not yet approved or cleared by the [place] FDA and  has been authorized for detection and/or diagnosis of SARS-CoV-2 by  FDA under an Emergency Use Authorization (EUA). This EUA will remain  in effect (meaning this test can be used) for the duration of the  Covid-19 declaration under Section 564(b)(1) of the Act, 21  U.S.C. section 360bbb-3(b)(1), unless the authorization is  terminated or revoked.    Respiratory Syncytial Virus by PCR NEGATIVE NEGATIVE Final    Comment: (NOTE) Fact Sheet for Patients: PinkCheek.be  Fact Sheet for Healthcare Providers: GravelBags.it  This test is not yet approved or cleared by the [place] FDA and  has been authorized for detection and/or diagnosis of SARS-CoV-2 by  FDA under an Emergency Use Authorization (EUA). This EUA will remain  in effect (meaning this test can be used) for the duration of the  COVID-19 declaration under Section 564(b)(1) of the Act, 21 U.S.C.  section 360bbb-3(b)(1), unless the authorization is terminated or  revoked. Performed at [facility], [place] [address], [place], [place] [address]          Radiology Studies: IR Angiogram Visceral Selective  Result Date: 04/27/2020 INDICATION: 84 year old with GI bleeding. CTA demonstrated focal hemorrhage in the distal transverse colon region. Patient was consented for mesenteric arteriogram and possible embolization. EXAM: MESENTERIC ARTERIOGRAM: SMA AND IMA ULTRASOUND GUIDANCE FOR VASCULAR ACCESS MEDICATIONS: Ciprofloxacin 400 mg. The antibiotic was administered within 1 hour of the procedure ANESTHESIA/SEDATION: Moderate (conscious)  sedation was employed during this procedure. A total of Versed 1.0 mg and Fentanyl 25 mcg was administered intravenously. Moderate Sedation Time: 98 minutes. The patient's level of consciousness and vital signs were monitored continuously by radiology nursing throughout the procedure under my direct supervision. CONTRAST:  200 mg Omnipaque 300 FLUOROSCOPY TIME:  Fluoroscopy Time: 25 minutes, 36 seconds, 782 COMPLICATIONS: None immediate. PROCEDURE: Informed consent was obtained from the patient following explanation of the procedure, risks, benefits and alternatives. The patient understands, agrees and consents for the procedure. All questions were addressed. A time out was performed prior to the initiation of the procedure. Maximal barrier sterile technique utilized including caps, mask, sterile  gowns, sterile gloves, large sterile drape, hand hygiene, and Betadine prep. Fluoroscopy demonstrated that the urinary bladder was massively distended with contrast. Therefore, Foley catheter was performed prior to the procedure. Ciprofloxacin was given due to the Foley catheter placement. Ultrasound demonstrated a patent right common femoral artery. Ultrasound image was saved for documentation. Right groin was prepped and draped in sterile fashion. Skin was anesthetized using 1% lidocaine. Small incision was made. Using ultrasound guidance, 21 gauge needle was directed into the right common femoral artery and a micropuncture dilator set was placed. Five French vascular sheath was placed. A Sos catheter was used to cannulate the SMA. SMA arteriograms were performed. Multiple attempts were made to cannulate the middle colic artery using a microcatheter but this was unsuccessful. Attention was directed to the IMA. It was difficult to cannulate the IMA but it was eventually cannulated after the IMA was identified using a lateral projection and angiography. IMA injections were performed with attention to the left upper abdomen  at the area of concern. No active bleeding was identified. The SMA was cannulated again using a RIM catheter. SMA arteriogram was performed. No active bleeding was identified. Again, attempted to cannulate the middle colic artery with a microcatheter but unable to cannulate the vessel. Five French catheter was removed over a Bentson wire. Angiography was performed through the right groin sheath. Right groin sheath was removed using an ExoSeal closure device. Right groin hemostasis at the end of the procedure. Bandage placed over the puncture site. FINDINGS: SMA is widely patent. No evidence for active bleeding. Attention was directed to the left upper quadrant in the distal transverse colon region. Middle colic artery appears to come off anteriorly but this could never be successfully cannulated. IMA is patent. Attention was directed to the left colic artery distribution. No significant filling of the distal transverse colon from the IMA distribution. No evidence of active bleeding from the IMA. IMPRESSION: Angiography of the superior mesenteric artery and inferior mesenteric artery. No evidence for active bleeding. Electronically Signed   By: Markus Daft M.D.   On: 04/27/2020 08:02   IR Angiogram Visceral Selective  Result Date: 04/27/2020 INDICATION: 84 year old with GI bleeding. CTA demonstrated focal hemorrhage in the distal transverse colon region. Patient was consented for mesenteric arteriogram and possible embolization. EXAM: MESENTERIC ARTERIOGRAM: SMA AND IMA ULTRASOUND GUIDANCE FOR VASCULAR ACCESS MEDICATIONS: Ciprofloxacin 400 mg. The antibiotic was administered within 1 hour of the procedure ANESTHESIA/SEDATION: Moderate (conscious) sedation was employed during this procedure. A total of Versed 1.0 mg and Fentanyl 25 mcg was administered intravenously. Moderate Sedation Time: 98 minutes. The patient's level of consciousness and vital signs were monitored continuously by radiology nursing throughout  the procedure under my direct supervision. CONTRAST:  200 mg Omnipaque 300 FLUOROSCOPY TIME:  Fluoroscopy Time: 25 minutes, 36 seconds, 885 COMPLICATIONS: None immediate. PROCEDURE: Informed consent was obtained from the patient following explanation of the procedure, risks, benefits and alternatives. The patient understands, agrees and consents for the procedure. All questions were addressed. A time out was performed prior to the initiation of the procedure. Maximal barrier sterile technique utilized including caps, mask, sterile gowns, sterile gloves, large sterile drape, hand hygiene, and Betadine prep. Fluoroscopy demonstrated that the urinary bladder was massively distended with contrast. Therefore, Foley catheter was performed prior to the procedure. Ciprofloxacin was given due to the Foley catheter placement. Ultrasound demonstrated a patent right common femoral artery. Ultrasound image was saved for documentation. Right groin was prepped and draped in sterile fashion.  Skin was anesthetized using 1% lidocaine. Small incision was made. Using ultrasound guidance, 21 gauge needle was directed into the right common femoral artery and a micropuncture dilator set was placed. Five French vascular sheath was placed. A Sos catheter was used to cannulate the SMA. SMA arteriograms were performed. Multiple attempts were made to cannulate the middle colic artery using a microcatheter but this was unsuccessful. Attention was directed to the IMA. It was difficult to cannulate the IMA but it was eventually cannulated after the IMA was identified using a lateral projection and angiography. IMA injections were performed with attention to the left upper abdomen at the area of concern. No active bleeding was identified. The SMA was cannulated again using a RIM catheter. SMA arteriogram was performed. No active bleeding was identified. Again, attempted to cannulate the middle colic artery with a microcatheter but unable to  cannulate the vessel. Five French catheter was removed over a Bentson wire. Angiography was performed through the right groin sheath. Right groin sheath was removed using an ExoSeal closure device. Right groin hemostasis at the end of the procedure. Bandage placed over the puncture site. FINDINGS: SMA is widely patent. No evidence for active bleeding. Attention was directed to the left upper quadrant in the distal transverse colon region. Middle colic artery appears to come off anteriorly but this could never be successfully cannulated. IMA is patent. Attention was directed to the left colic artery distribution. No significant filling of the distal transverse colon from the IMA distribution. No evidence of active bleeding from the IMA. IMPRESSION: Angiography of the superior mesenteric artery and inferior mesenteric artery. No evidence for active bleeding. Electronically Signed   By: Markus Daft M.D.   On: 04/27/2020 08:02   IR US Guide Vasc Access Right  Result Date: 04/27/2020 INDICATION: 84 year old with GI bleeding. CTA demonstrated focal hemorrhage in the distal transverse colon region. Patient was consented for mesenteric arteriogram and possible embolization. EXAM: MESENTERIC ARTERIOGRAM: SMA AND IMA ULTRASOUND GUIDANCE FOR VASCULAR ACCESS MEDICATIONS: Ciprofloxacin 400 mg. The antibiotic was administered within 1 hour of the procedure ANESTHESIA/SEDATION: Moderate (conscious) sedation was employed during this procedure. A total of Versed 1.0 mg and Fentanyl 25 mcg was administered intravenously. Moderate Sedation Time: 98 minutes. The patient's level of consciousness and vital signs were monitored continuously by radiology nursing throughout the procedure under my direct supervision. CONTRAST:  200 mg Omnipaque 300 FLUOROSCOPY TIME:  Fluoroscopy Time: 25 minutes, 36 seconds, 629 COMPLICATIONS: None immediate. PROCEDURE: Informed consent was obtained from the patient following explanation of the  procedure, risks, benefits and alternatives. The patient understands, agrees and consents for the procedure. All questions were addressed. A time out was performed prior to the initiation of the procedure. Maximal barrier sterile technique utilized including caps, mask, sterile gowns, sterile gloves, large sterile drape, hand hygiene, and Betadine prep. Fluoroscopy demonstrated that the urinary bladder was massively distended with contrast. Therefore, Foley catheter was performed prior to the procedure. Ciprofloxacin was given due to the Foley catheter placement. Ultrasound demonstrated a patent right common femoral artery. Ultrasound image was saved for documentation. Right groin was prepped and draped in sterile fashion. Skin was anesthetized using 1% lidocaine. Small incision was made. Using ultrasound guidance, 21 gauge needle was directed into the right common femoral artery and a micropuncture dilator set was placed. Five French vascular sheath was placed. A Sos catheter was used to cannulate the SMA. SMA arteriograms were performed. Multiple attempts were made to cannulate the middle colic artery using  a microcatheter but this was unsuccessful. Attention was directed to the IMA. It was difficult to cannulate the IMA but it was eventually cannulated after the IMA was identified using a lateral projection and angiography. IMA injections were performed with attention to the left upper abdomen at the area of concern. No active bleeding was identified. The SMA was cannulated again using a RIM catheter. SMA arteriogram was performed. No active bleeding was identified. Again, attempted to cannulate the middle colic artery with a microcatheter but unable to cannulate the vessel. Five French catheter was removed over a Bentson wire. Angiography was performed through the right groin sheath. Right groin sheath was removed using an ExoSeal closure device. Right groin hemostasis at the end of the procedure. Bandage placed  over the puncture site. FINDINGS: SMA is widely patent. No evidence for active bleeding. Attention was directed to the left upper quadrant in the distal transverse colon region. Middle colic artery appears to come off anteriorly but this could never be successfully cannulated. IMA is patent. Attention was directed to the left colic artery distribution. No significant filling of the distal transverse colon from the IMA distribution. No evidence of active bleeding from the IMA. IMPRESSION: Angiography of the superior mesenteric artery and inferior mesenteric artery. No evidence for active bleeding. Electronically Signed   By: Markus Daft M.D.   On: 04/27/2020 08:02   CT Angio Abd/Pel w/ and/or w/o  Result Date: 04/26/2020 CLINICAL DATA:  84 year old female with history of diverticulosis and intestinal hemorrhage. EXAM: CTA ABDOMEN AND PELVIS WITHOUT AND WITH CONTRAST TECHNIQUE: Multidetector CT imaging of the abdomen and pelvis was performed using the standard protocol during bolus administration of intravenous contrast. Multiplanar reconstructed images and MIPs were obtained and reviewed to evaluate the vascular anatomy. CONTRAST:  119mL OMNIPAQUE IOHEXOL 350 MG/ML SOLN COMPARISON:  None. 02/14/2019 FINDINGS: VASCULAR Aorta: Normal caliber and patent throughout. Scattered fibrofatty and calcific atherosclerotic changes most prominent in the distal segment. Celiac: Patent without evidence of aneurysm, dissection, vasculitis or significant stenosis. SMA: Patent without evidence of aneurysm, dissection, vasculitis or significant stenosis. Renals: Single bilateral renal arteries are patent without evidence of aneurysm, dissection, vasculitis, fibromuscular dysplasia or significant stenosis. IMA: Patent without evidence of aneurysm, dissection, vasculitis or significant stenosis. Inflow: Patent without evidence of aneurysm, dissection, vasculitis or significant stenosis. Proximal Outflow: Bilateral common femoral and  visualized portions of the superficial and profunda femoral arteries are patent without evidence of aneurysm, dissection, vasculitis or significant stenosis. Veins: No obvious venous abnormality. Review of the MIP images confirms the above findings. NON-VASCULAR Lower chest: Bibasilar subsegmental atelectasis. Heart is normal in size. No pericardial effusion. Hepatobiliary: The liver is normal in size, contour, and attenuation. No focal lesion. The gallbladder is present with layering sludge versus vicarious contrast excretion. No pericholecystic fluid, gallbladder wall thickening, or cholelithiasis. No intra or extrahepatic biliary ductal dilation. Pancreas: Coarse calcification within the pancreatic head. No pancreatic ductal dilatation or surrounding inflammatory changes. Spleen: Normal in size without focal abnormality. Adrenals/Urinary Tract: Adrenal glands are unremarkable. Kidneys are normal, without renal calculi, focal lesion, or hydronephrosis. Bladder is unremarkable. Stomach/Bowel: Focus of active extravasation the distal transverse colon. Stomach is within normal limits. Appendix appears normal. Scattered colonic diverticula. No evidence of bowel wall thickening, distention, or inflammatory changes. Lymphatic: No enlarged abdominal or pelvic lymph nodes. Reproductive: Status post hysterectomy. No adnexal masses. Other: No abdominal wall hernia or abnormality. No abdominopelvic ascites. Musculoskeletal: Multilevel degenerative changes of the visualized thoracolumbar spine. No acute osseous abnormality.  IMPRESSION: VASCULAR 1. Focal active hemorrhage within the distal transverse colon. 2.  Aortic Atherosclerosis (ICD10-I70.0). NON-VASCULAR 1. Diverticulosis without evidence of diverticulitis. Recommend urgent Interventional Radiology consultation. These results were called by telephone at the time of interpretation on 04/26/2020 at 4:20 pm to provider Dr. Marylyn Ishihara, Who verbally acknowledged these results.  [name], MD [facility] Electronically Signed   By: [name] MD   On: 04/26/2020 16:25        Scheduled Meds: . amLODipine  10 mg Oral Daily  . ferrous sulfate  325 mg Oral Q breakfast  . sodium chloride flush  3 mL Intravenous Q12H   Continuous Infusions: . sodium chloride 75 mL/hr at 04/27/20 0655  . potassium chloride       LOS: 1 day    Time spent: 35 mins.More than 50% of that time was spent in counseling and/or coordination of care.      Shelly Coss, MD [facility] P10/22/2021, 8:20 AM

## 2020-04-27 NOTE — Procedures (Signed)
Interventional Radiology Procedure Note  Procedure: Mesenteric angiography  Findings: Please refer to procedural dictation for full description.  Successful cannulation of middle colic artery.  Angiograms in multiple obliquities without evidence of hemorrhage.  Access/Closure:  Right common femoral artery, 6 Fr Angioseal  Complications: None immediate  Estimated Blood Loss: <5 mL  Recommendations: Flat for 2 hours (until 13:30), head of bed up to 30 degrees for 2 hours (until 17:30). Continue to monitor CBC, transfuse as necessary. No indication for repeat angiography unless there is profuse hematochezia and evidence of hemorrhagic shock.   [name], MD Pager: [phone]

## 2020-04-27 NOTE — Plan of Care (Signed)
Patient is alert and oriented. Patient has been calm, cooperative, and very responsive today since returning from her 2nd IR exploration the did not reveal any active bleeding. Patient has not had a bowl movement or any signs of actively bleeding the remainder of this shift. PRBC that were ordered prior to procedure have not been administered as of yet. Message Danis MD the provider that order the PRBC's and ask if we needed to still administer them or should we get and H&H first. Dr. Loletha Carrow agreed. H&H first.  H& H resulted with increased values.  Will let night nurse know to message the provider and ask for recommendations. IR femoral incision clean and dry. Patient sitting up eating clear liquid. Report given to nurse Wyatt Portela RN  Problem: Elimination: Goal: Will not experience complications related to bowel motility Outcome: Progressing Goal: Will not experience complications related to urinary retention Outcome: Progressing   Problem: Skin Integrity: Goal: Risk for impaired skin integrity will decrease Outcome: Progressing

## 2020-04-27 NOTE — Progress Notes (Addendum)
[facility] Progress Note  CC:  Painless hematochezia   Subjective: She denies having any abdominal pain. No further rectal bleeding since admission. No complaints at this time. She wants to know when she can go home.    Objective:  Vital signs in last 24 hours: Temp:  [97.6 F (36.4 C)-98.4 F (36.9 C)] 98.4 F (36.9 C) (10/22 0539) Pulse Rate:  [68-100] 86 (10/22 0539) Resp:  [11-26] 20 (10/22 0539) BP: (87-186)/(50-94) 106/60 (10/22 0539) SpO2:  [98 %-100 %] 100 % (10/22 0539) Weight:  [56.7 kg] 56.7 kg (10/21 1127) Last BM Date: 04/26/20 General:   Alert 84 year old female in NAD.  Heart: RRR, no murmur.  Pulm:  Breath sounds clear throughout.  Abdomen: Soft, nondistended. Nontender. Lower mid line scar thickened/? mesh. + BS x 4 quads.  Extremities:  LEs with trace edema. SCDs in use.  Neurologic:  Alert and  oriented x4;  grossly normal neurologically. Psych:  Alert and cooperative. Normal mood and affect.  Intake/Output from previous day: 10/21 0701 - 10/22 0700 In: 408.4 [I.V.:408.4] Out: 2000 [Urine:2000] Intake/Output this shift: Total I/O In: 237 [P.O.:237] Out: -   Lab Results: Recent Labs    04/26/20 1227 04/26/20 1227 04/26/20 2052 04/27/20 0111 04/27/20 0713  WBC 4.6  --  4.3  --   --   HGB 10.4*   < > 9.1* 8.1* 8.0*  HCT 29.5*   < > 26.8* 23.7* 22.9*  PLT 212  --  213  --   --    < > = values in this interval not displayed.   BMET Recent Labs    04/26/20 1227 04/27/20 0107  NA 139 139  K 3.3* 3.0*  CL 102 107  CO2 28 25  GLUCOSE 106* 107*  BUN 13 10  CREATININE 0.64 0.54  CALCIUM 9.9 9.0   LFT Recent Labs    04/27/20 0107  PROT 5.1*  ALBUMIN 3.0*  AST 16  ALT 12  ALKPHOS 57  BILITOT 0.4   PT/INR Recent Labs    04/26/20 1643  LABPROT 13.4  INR 1.1   Hepatitis Panel No results for input(s): HEPBSAG, HCVAB, HEPAIGM, HEPBIGM in the last 72 hours.  IR Angiogram Visceral Selective  Result Date:  04/27/2020 INDICATION: 84 year old with GI bleeding. CTA demonstrated focal hemorrhage in the distal transverse colon region. Patient was consented for mesenteric arteriogram and possible embolization. EXAM: MESENTERIC ARTERIOGRAM: SMA AND IMA ULTRASOUND GUIDANCE FOR VASCULAR ACCESS MEDICATIONS: Ciprofloxacin 400 mg. The antibiotic was administered within 1 hour of the procedure ANESTHESIA/SEDATION: Moderate (conscious) sedation was employed during this procedure. A total of Versed 1.0 mg and Fentanyl 25 mcg was administered intravenously. Moderate Sedation Time: 98 minutes. The patient's level of consciousness and vital signs were monitored continuously by radiology nursing throughout the procedure under my direct supervision. CONTRAST:  200 mg Omnipaque 300 FLUOROSCOPY TIME:  Fluoroscopy Time: 25 minutes, 36 seconds, 846 COMPLICATIONS: None immediate. PROCEDURE: Informed consent was obtained from the patient following explanation of the procedure, risks, benefits and alternatives. The patient understands, agrees and consents for the procedure. All questions were addressed. A time out was performed prior to the initiation of the procedure. Maximal barrier sterile technique utilized including caps, mask, sterile gowns, sterile gloves, large sterile drape, hand hygiene, and Betadine prep. Fluoroscopy demonstrated that the urinary bladder was massively distended with contrast. Therefore, Foley catheter was performed prior to the procedure. Ciprofloxacin was given due to the Foley catheter placement. Ultrasound  demonstrated a patent right common femoral artery. Ultrasound image was saved for documentation. Right groin was prepped and draped in sterile fashion. Skin was anesthetized using 1% lidocaine. Small incision was made. Using ultrasound guidance, 21 gauge needle was directed into the right common femoral artery and a micropuncture dilator set was placed. Five French vascular sheath was placed. A Sos catheter was  used to cannulate the SMA. SMA arteriograms were performed. Multiple attempts were made to cannulate the middle colic artery using a microcatheter but this was unsuccessful. Attention was directed to the IMA. It was difficult to cannulate the IMA but it was eventually cannulated after the IMA was identified using a lateral projection and angiography. IMA injections were performed with attention to the left upper abdomen at the area of concern. No active bleeding was identified. The SMA was cannulated again using a RIM catheter. SMA arteriogram was performed. No active bleeding was identified. Again, attempted to cannulate the middle colic artery with a microcatheter but unable to cannulate the vessel. Five French catheter was removed over a Bentson wire. Angiography was performed through the right groin sheath. Right groin sheath was removed using an ExoSeal closure device. Right groin hemostasis at the end of the procedure. Bandage placed over the puncture site. FINDINGS: SMA is widely patent. No evidence for active bleeding. Attention was directed to the left upper quadrant in the distal transverse colon region. Middle colic artery appears to come off anteriorly but this could never be successfully cannulated. IMA is patent. Attention was directed to the left colic artery distribution. No significant filling of the distal transverse colon from the IMA distribution. No evidence of active bleeding from the IMA. IMPRESSION: Angiography of the superior mesenteric artery and inferior mesenteric artery. No evidence for active bleeding. Electronically Signed   By: Markus Daft M.D.   On: 04/27/2020 08:02   IR Angiogram Visceral Selective  Result Date: 04/27/2020 INDICATION: 84 year old with GI bleeding. CTA demonstrated focal hemorrhage in the distal transverse colon region. Patient was consented for mesenteric arteriogram and possible embolization. EXAM: MESENTERIC ARTERIOGRAM: SMA AND IMA ULTRASOUND GUIDANCE FOR  VASCULAR ACCESS MEDICATIONS: Ciprofloxacin 400 mg. The antibiotic was administered within 1 hour of the procedure ANESTHESIA/SEDATION: Moderate (conscious) sedation was employed during this procedure. A total of Versed 1.0 mg and Fentanyl 25 mcg was administered intravenously. Moderate Sedation Time: 98 minutes. The patient's level of consciousness and vital signs were monitored continuously by radiology nursing throughout the procedure under my direct supervision. CONTRAST:  200 mg Omnipaque 300 FLUOROSCOPY TIME:  Fluoroscopy Time: 25 minutes, 36 seconds, 272 COMPLICATIONS: None immediate. PROCEDURE: Informed consent was obtained from the patient following explanation of the procedure, risks, benefits and alternatives. The patient understands, agrees and consents for the procedure. All questions were addressed. A time out was performed prior to the initiation of the procedure. Maximal barrier sterile technique utilized including caps, mask, sterile gowns, sterile gloves, large sterile drape, hand hygiene, and Betadine prep. Fluoroscopy demonstrated that the urinary bladder was massively distended with contrast. Therefore, Foley catheter was performed prior to the procedure. Ciprofloxacin was given due to the Foley catheter placement. Ultrasound demonstrated a patent right common femoral artery. Ultrasound image was saved for documentation. Right groin was prepped and draped in sterile fashion. Skin was anesthetized using 1% lidocaine. Small incision was made. Using ultrasound guidance, 21 gauge needle was directed into the right common femoral artery and a micropuncture dilator set was placed. Five French vascular sheath was placed. A Sos catheter was  used to cannulate the SMA. SMA arteriograms were performed. Multiple attempts were made to cannulate the middle colic artery using a microcatheter but this was unsuccessful. Attention was directed to the IMA. It was difficult to cannulate the IMA but it was eventually  cannulated after the IMA was identified using a lateral projection and angiography. IMA injections were performed with attention to the left upper abdomen at the area of concern. No active bleeding was identified. The SMA was cannulated again using a RIM catheter. SMA arteriogram was performed. No active bleeding was identified. Again, attempted to cannulate the middle colic artery with a microcatheter but unable to cannulate the vessel. Five French catheter was removed over a Bentson wire. Angiography was performed through the right groin sheath. Right groin sheath was removed using an ExoSeal closure device. Right groin hemostasis at the end of the procedure. Bandage placed over the puncture site. FINDINGS: SMA is widely patent. No evidence for active bleeding. Attention was directed to the left upper quadrant in the distal transverse colon region. Middle colic artery appears to come off anteriorly but this could never be successfully cannulated. IMA is patent. Attention was directed to the left colic artery distribution. No significant filling of the distal transverse colon from the IMA distribution. No evidence of active bleeding from the IMA. IMPRESSION: Angiography of the superior mesenteric artery and inferior mesenteric artery. No evidence for active bleeding. Electronically Signed   By: Markus Daft M.D.   On: 04/27/2020 08:02   IR US Guide Vasc Access Right  Result Date: 04/27/2020 INDICATION: 84 year old with GI bleeding. CTA demonstrated focal hemorrhage in the distal transverse colon region. Patient was consented for mesenteric arteriogram and possible embolization. EXAM: MESENTERIC ARTERIOGRAM: SMA AND IMA ULTRASOUND GUIDANCE FOR VASCULAR ACCESS MEDICATIONS: Ciprofloxacin 400 mg. The antibiotic was administered within 1 hour of the procedure ANESTHESIA/SEDATION: Moderate (conscious) sedation was employed during this procedure. A total of Versed 1.0 mg and Fentanyl 25 mcg was administered  intravenously. Moderate Sedation Time: 98 minutes. The patient's level of consciousness and vital signs were monitored continuously by radiology nursing throughout the procedure under my direct supervision. CONTRAST:  200 mg Omnipaque 300 FLUOROSCOPY TIME:  Fluoroscopy Time: 25 minutes, 36 seconds, 818 COMPLICATIONS: None immediate. PROCEDURE: Informed consent was obtained from the patient following explanation of the procedure, risks, benefits and alternatives. The patient understands, agrees and consents for the procedure. All questions were addressed. A time out was performed prior to the initiation of the procedure. Maximal barrier sterile technique utilized including caps, mask, sterile gowns, sterile gloves, large sterile drape, hand hygiene, and Betadine prep. Fluoroscopy demonstrated that the urinary bladder was massively distended with contrast. Therefore, Foley catheter was performed prior to the procedure. Ciprofloxacin was given due to the Foley catheter placement. Ultrasound demonstrated a patent right common femoral artery. Ultrasound image was saved for documentation. Right groin was prepped and draped in sterile fashion. Skin was anesthetized using 1% lidocaine. Small incision was made. Using ultrasound guidance, 21 gauge needle was directed into the right common femoral artery and a micropuncture dilator set was placed. Five French vascular sheath was placed. A Sos catheter was used to cannulate the SMA. SMA arteriograms were performed. Multiple attempts were made to cannulate the middle colic artery using a microcatheter but this was unsuccessful. Attention was directed to the IMA. It was difficult to cannulate the IMA but it was eventually cannulated after the IMA was identified using a lateral projection and angiography. IMA injections were performed with attention  to the left upper abdomen at the area of concern. No active bleeding was identified. The SMA was cannulated again using a RIM  catheter. SMA arteriogram was performed. No active bleeding was identified. Again, attempted to cannulate the middle colic artery with a microcatheter but unable to cannulate the vessel. Five French catheter was removed over a Bentson wire. Angiography was performed through the right groin sheath. Right groin sheath was removed using an ExoSeal closure device. Right groin hemostasis at the end of the procedure. Bandage placed over the puncture site. FINDINGS: SMA is widely patent. No evidence for active bleeding. Attention was directed to the left upper quadrant in the distal transverse colon region. Middle colic artery appears to come off anteriorly but this could never be successfully cannulated. IMA is patent. Attention was directed to the left colic artery distribution. No significant filling of the distal transverse colon from the IMA distribution. No evidence of active bleeding from the IMA. IMPRESSION: Angiography of the superior mesenteric artery and inferior mesenteric artery. No evidence for active bleeding. Electronically Signed   By: Markus Daft M.D.   On: 04/27/2020 08:02   CT Angio Abd/Pel w/ and/or w/o  Result Date: 04/26/2020 CLINICAL DATA:  84 year old female with history of diverticulosis and intestinal hemorrhage. EXAM: CTA ABDOMEN AND PELVIS WITHOUT AND WITH CONTRAST TECHNIQUE: Multidetector CT imaging of the abdomen and pelvis was performed using the standard protocol during bolus administration of intravenous contrast. Multiplanar reconstructed images and MIPs were obtained and reviewed to evaluate the vascular anatomy. CONTRAST:  161mL OMNIPAQUE IOHEXOL 350 MG/ML SOLN COMPARISON:  None. 02/14/2019 FINDINGS: VASCULAR Aorta: Normal caliber and patent throughout. Scattered fibrofatty and calcific atherosclerotic changes most prominent in the distal segment. Celiac: Patent without evidence of aneurysm, dissection, vasculitis or significant stenosis. SMA: Patent without evidence of aneurysm,  dissection, vasculitis or significant stenosis. Renals: Single bilateral renal arteries are patent without evidence of aneurysm, dissection, vasculitis, fibromuscular dysplasia or significant stenosis. IMA: Patent without evidence of aneurysm, dissection, vasculitis or significant stenosis. Inflow: Patent without evidence of aneurysm, dissection, vasculitis or significant stenosis. Proximal Outflow: Bilateral common femoral and visualized portions of the superficial and profunda femoral arteries are patent without evidence of aneurysm, dissection, vasculitis or significant stenosis. Veins: No obvious venous abnormality. Review of the MIP images confirms the above findings. NON-VASCULAR Lower chest: Bibasilar subsegmental atelectasis. Heart is normal in size. No pericardial effusion. Hepatobiliary: The liver is normal in size, contour, and attenuation. No focal lesion. The gallbladder is present with layering sludge versus vicarious contrast excretion. No pericholecystic fluid, gallbladder wall thickening, or cholelithiasis. No intra or extrahepatic biliary ductal dilation. Pancreas: Coarse calcification within the pancreatic head. No pancreatic ductal dilatation or surrounding inflammatory changes. Spleen: Normal in size without focal abnormality. Adrenals/Urinary Tract: Adrenal glands are unremarkable. Kidneys are normal, without renal calculi, focal lesion, or hydronephrosis. Bladder is unremarkable. Stomach/Bowel: Focus of active extravasation the distal transverse colon. Stomach is within normal limits. Appendix appears normal. Scattered colonic diverticula. No evidence of bowel wall thickening, distention, or inflammatory changes. Lymphatic: No enlarged abdominal or pelvic lymph nodes. Reproductive: Status post hysterectomy. No adnexal masses. Other: No abdominal wall hernia or abnormality. No abdominopelvic ascites. Musculoskeletal: Multilevel degenerative changes of the visualized thoracolumbar spine. No  acute osseous abnormality. IMPRESSION: VASCULAR 1. Focal active hemorrhage within the distal transverse colon. 2.  Aortic Atherosclerosis (ICD10-I70.0). NON-VASCULAR 1. Diverticulosis without evidence of diverticulitis. Recommend urgent Interventional Radiology consultation. These results were called by telephone at the time of interpretation on  04/26/2020 at 4:20 pm to provider Dr. Marylyn Ishihara, Who verbally acknowledged these results. [name], MD [facility] Radiology Electronically Signed   By: [name] MD   On: 04/26/2020 16:25    Assessment / Plan:  98.  84 year old female with history of recurrent diverticular bleed presents with recurrence of painless hematochezia and questionable black solid stool with anemia.  Hemoglobin 10.4 (base line Hg 13.9 on 07/28/2019).  Abdominal/pelvic CT angiogram showed active hemorrhage to the distal transverse colon.  S/P mesenteric arteriogram by IR 10/21 did not identify active bleeding, unable to cannulate the middle colic artery. Hg continues to drift downward. Hg 9.1 -> 8.1 -> 8.0. BUN 10. She remains hemodynamically stable.  -H/H Q 6hrs, next draw due around 1pm -Transfuse for Hg < 7.5 - 8 -NPO for now, if H/H stabilizes later today may start clear liquid diet  -Continue PPI for now -Further recommendations per Dr. Loletha Carrow   2. History of colon polyps per colonoscopy 02/2019 which were not removed in the setting of active GI bleeding  3. Hypokalemia. K+ 3.0.  -KCL replacement per the hospitalist     Active Problems:   GIB (gastrointestinal bleeding)     LOS: 1 day   Noralyn Pick  04/27/2020, 9:10 AM   I have discussed the case with the PA. I personally interviewed and examined the patient.  When I saw the patient just now she had passed a large amount of maroon blood in the bedpan about 10 minutes earlier.  BP 107/83, pule 87   Alert, conversational, no chest pain or dyspnea  She  is rebleeding.  Might ba able to catch on repeat angiogram.  I spoke to Dr. Serafina Royals of IR, he agrees to take patient down for angiogram for attempted localization and control after the case he about to start.  I ordered a unit PRBCs and spoke to patient's nurse and messaged [facility] doc.    35 minutes were spent on this encounter (including chart review, history/exam, counseling/coordination of care, and documentation)     Nelida Meuse III Office: [phone]

## 2020-04-27 NOTE — Telephone Encounter (Signed)
He and I spoke earlier about a hospitalized patient.  I will reach him and make sure he has my direct number.

## 2020-04-28 DIAGNOSIS — K922 Gastrointestinal hemorrhage, unspecified: Secondary | ICD-10-CM | POA: Diagnosis not present

## 2020-04-28 DIAGNOSIS — D62 Acute posthemorrhagic anemia: Secondary | ICD-10-CM | POA: Diagnosis not present

## 2020-04-28 DIAGNOSIS — K5731 Diverticulosis of large intestine without perforation or abscess with bleeding: Secondary | ICD-10-CM | POA: Diagnosis not present

## 2020-04-28 LAB — BASIC METABOLIC PANEL
Anion gap: 7 (ref 5–15)
BUN: 11 mg/dL (ref 8–23)
CO2: 23 mmol/L (ref 22–32)
Calcium: 9.2 mg/dL (ref 8.9–10.3)
Chloride: 109 mmol/L (ref 98–111)
Creatinine, Ser: 0.59 mg/dL (ref 0.44–1.00)
GFR, Estimated: >60 mL/min (ref >60)
Glucose, Bld: 79 mg/dL (ref 70–99)
Potassium: 3.1 mmol/L — ABNORMAL LOW (ref 3.5–5.1)
Sodium: 139 mmol/L (ref 135–145)

## 2020-04-28 LAB — CBC WITH DIFFERENTIAL/PLATELET
Abs Immature Granulocytes: 0.01 10*3/uL (ref 0.00–0.07)
Basophils Absolute: 0 10*3/uL (ref 0.0–0.1)
Basophils Relative: 0 %
Eosinophils Absolute: 0.1 10*3/uL (ref 0.0–0.5)
Eosinophils Relative: 1 %
HCT: 23.1 % — ABNORMAL LOW (ref 36.0–46.0)
Hemoglobin: 7.9 g/dL — ABNORMAL LOW (ref 12.0–15.0)
Immature Granulocytes: 0 %
Lymphocytes Relative: 33 %
Lymphs Abs: 1.7 10*3/uL (ref 0.7–4.0)
MCH: 26.1 pg (ref 26.0–34.0)
MCHC: 34.2 g/dL (ref 30.0–36.0)
MCV: 76.2 fL — ABNORMAL LOW (ref 80.0–100.0)
Monocytes Absolute: 0.4 10*3/uL (ref 0.1–1.0)
Monocytes Relative: 7 %
Neutro Abs: 3 10*3/uL (ref 1.7–7.7)
Neutrophils Relative %: 59 %
Platelets: 190 10*3/uL (ref 150–400)
RBC: 3.03 MIL/uL — ABNORMAL LOW (ref 3.87–5.11)
RDW: 15.1 % (ref 11.5–15.5)
WBC: 5.1 10*3/uL (ref 4.0–10.5)
nRBC: 0 % (ref 0.0–0.2)

## 2020-04-28 LAB — HEMOGLOBIN AND HEMATOCRIT, BLOOD
HCT: 23.6 % — ABNORMAL LOW (ref 36.0–46.0)
Hemoglobin: 8.1 g/dL — ABNORMAL LOW (ref 12.0–15.0)

## 2020-04-28 MED ORDER — POTASSIUM CHLORIDE CRYS ER 20 MEQ PO TBCR
40.0000 meq | EXTENDED_RELEASE_TABLET | ORAL | Status: AC
  Administered 2020-04-28 (×2): 40 meq via ORAL
  Filled 2020-04-28 (×2): qty 2

## 2020-04-28 NOTE — Progress Notes (Signed)
PROGRESS NOTE    Latoya Bennett  UJW:[id number] DOB: 1936-05-20 DOA: 04/26/2020 PCP: Horald Pollen, MD   Chief Complain: Rectal bleeding  Brief Narrative: Patient is 84 year-old female with history of hypertension, diverticulosis who presents with complaint of 1 week history of rectal bleeding.  On presentation she was found to be anemic.  GI was consulted who recommended IR consult for possible embolization.  IR did CT angiogram with finding of source of bleeding from distal transverse colon but there was no active bleeding so no intervention done.  General surgery is also on board.  Hemoglobin this morning in the range of 8.  She had a large bloody bowel movement on the late morning of 10/22121 but  CT guided angiogram found to be negative for active bleeding.  Plan to continue monitoring for 24 hours before discharge planning.  Assessment & Plan:   Active Problems:   GIB (gastrointestinal bleeding)   Acute lower GI bleed: Diverticular bleed.  CT angiogram showed source of bleeding from distal transverse colon.  GI, surgery, IR following.  IR evaluated her for embolization but there was no active bleeding so intervention not done. Her baseline hemoglobin is in the range of 14, she presented with hemoglobin of 10 ,this morning hemoglobin is 8.  She was also given a unit of PRBC during this hospitalization.  We will continue monitor hemoglobin. She had a large bloody bowel movement on the late morning of 10/22121 but  CT guided angiogram found to be negative for active bleeding.  Plan to continue monitoring for 24 hours before discharge planning.  Hypertension: Blood pressure is currently stable.  Will hold antihypertensives at the moment.  Hypokalemia: Being supplemented with potassium.  Check BMP tomorrow         DVT prophylaxis:SCD Code Status: Full Family Communication: Called sister on phone [phone] Status is: Inpatient  Remains inpatient appropriate  because:Inpatient level of care appropriate due to severity of illness   Dispo: The patient is from: Home              Anticipated d/c is to: Home              Anticipated d/c date QI:[id number]              Patient currently is not medically stable to d/c.     Consultants: GI, general surgery, IR  Procedures: CT angiogram  Antimicrobials:  Anti-infectives (From admission, onward)   Start     Dose/Rate Route Frequency Ordered Stop   04/26/20 1815  ciprofloxacin (CIPRO) IVPB 400 mg        400 mg 200 mL/hr over 60 Minutes Intravenous  Once 04/26/20 1826 04/26/20 1912      Subjective: Patient seen and examined at the bedside this morning.  Hemodynamically stable.  Had a small bloody bowel movement this morning but  not significant.  Denies any abdominal pain.  Objective: Vitals:   04/27/20 1721 04/27/20 2118 04/28/20 0202 04/28/20 0615  BP: 125/67 120/63 (!) 118/59 (!) 122/58  Pulse: 87 84 76 77  Resp: 16 20 18 18   Temp: 99.3 F (37.4 C) 98.2 F (36.8 C) 98.2 F (36.8 C) 98.5 F (36.9 C)  TempSrc: Oral Oral Oral Oral  SpO2: 100% 100% 99% 100%  Weight:      Height:       No intake or output data in the 24 hours ending 04/28/20 0901 Filed Weights   04/26/20 1112 04/26/20 1127  Weight: 56.7 kg 56.7  kg    Examination:  General exam: Appears calm and comfortable ,Not in distress, pleasant elderly female HEENT:PERRL,Oral mucosa moist, Ear/Nose normal on gross exam Respiratory system: Bilateral equal air entry, normal vesicular breath sounds, no wheezes or crackles  Cardiovascular system: S1 & S2 heard, RRR. No JVD, murmurs, rubs, gallops or clicks. Gastrointestinal system: Abdomen is nondistended, soft and nontender. No organomegaly or masses felt. Normal bowel sounds heard. Central nervous system: Alert and oriented. No focal neurological deficits. Extremities: No edema, no clubbing ,no cyanosis, distal peripheral pulses palpable. Skin: No rashes, lesions or ulcers,no  icterus ,no pallor   Data Reviewed: I have personally reviewed following labs and imaging studies  CBC: Recent Labs  Lab 04/26/20 1227 04/26/20 1227 04/26/20 2052 04/27/20 0111 04/27/20 0713 04/27/20 1625 04/27/20 2059 04/28/20 0137 04/28/20 0725  WBC 4.6  --  4.3  --   --   --   --  5.1  --   NEUTROABS  --   --   --   --   --   --   --  3.0  --   HGB 10.4*   < > 9.1*   < > 8.0* 8.5* 7.8* 7.9* 8.1*  HCT 29.5*   < > 26.8*   < > 22.9* 24.7* 22.4* 23.1* 23.6*  MCV 74.7*  --  74.9*  --   --   --   --  76.2*  --   PLT 212  --  213  --   --   --   --  190  --    < > = values in this interval not displayed.   Basic Metabolic Panel: Recent Labs  Lab 04/26/20 1227 04/26/20 2058 04/27/20 0107 04/27/20 0827 04/28/20 0137  NA 139  --  139  --  139  K 3.3*  --  3.0*  --  3.1*  CL 102  --  107  --  109  CO2 28  --  25  --  23  GLUCOSE 106*  --  107*  --  79  BUN 13  --  10  --  11  CREATININE 0.64  --  0.54  --  0.59  CALCIUM 9.9  --  9.0  --  9.2  MG  --  1.9  --  2.0  --    GFR: Estimated Creatinine Clearance: 41.6 mL/min (by C-G formula based on SCr of 0.59 mg/dL). Liver Function Tests: Recent Labs  Lab 04/26/20 1227 04/27/20 0107  AST 28 16  ALT 14 12  ALKPHOS 73 57  BILITOT 0.5 0.4  PROT 6.9 5.1*  ALBUMIN 4.0 3.0*   No results for input(s): LIPASE, AMYLASE in the last 168 hours. No results for input(s): AMMONIA in the last 168 hours. Coagulation Profile: Recent Labs  Lab 04/26/20 1643  INR 1.1   Cardiac Enzymes: No results for input(s): CKTOTAL, CKMB, CKMBINDEX, TROPONINI in the last 168 hours. BNP (last 3 results) No results for input(s): PROBNP in the last 8760 hours. HbA1C: No results for input(s): HGBA1C in the last 72 hours. CBG: No results for input(s): GLUCAP in the last 168 hours. Lipid Profile: No results for input(s): CHOL, HDL, LDLCALC, TRIG, CHOLHDL, LDLDIRECT in the last 72 hours. Thyroid Function Tests: No results for input(s): TSH,  T4TOTAL, FREET4, T3FREE, THYROIDAB in the last 72 hours. Anemia Panel: No results for input(s): VITAMINB12, FOLATE, FERRITIN, TIBC, IRON, RETICCTPCT in the last 72 hours. Sepsis Labs: No results for input(s): PROCALCITON, LATICACIDVEN  in the last 168 hours.  Recent Results (from the past 240 hour(s))  Resp Panel by RT PCR (RSV, Flu A&B, Covid) - Nasopharyngeal Swab     Status: None   Collection Time: 04/26/20  8:03 PM   Specimen: Nasopharyngeal Swab  Result Value Ref Range Status   SARS Coronavirus 2 by RT PCR NEGATIVE NEGATIVE Final    Comment: (NOTE) SARS-CoV-2 target nucleic acids are NOT DETECTED.  The SARS-CoV-2 RNA is generally detectable in upper respiratoy specimens during the acute phase of infection. The lowest concentration of SARS-CoV-2 viral copies this assay can detect is 131 copies/mL. A negative result does not preclude SARS-Cov-2 infection and should not be used as the sole basis for treatment or other patient management decisions. A negative result may occur with  improper specimen collection/handling, submission of specimen other than nasopharyngeal swab, presence of viral mutation(s) within the areas targeted by this assay, and inadequate number of viral copies (<131 copies/mL). A negative result must be combined with clinical observations, patient history, and epidemiological information. The expected result is Negative.  Fact Sheet for Patients:  PinkCheek.be  Fact Sheet for Healthcare Providers:  GravelBags.it  This test is no t yet approved or cleared by the [place] FDA and  has been authorized for detection and/or diagnosis of SARS-CoV-2 by FDA under an Emergency Use Authorization (EUA). This EUA will remain  in effect (meaning this test can be used) for the duration of the COVID-19 declaration under Section 564(b)(1) of the Act, 21 U.S.C. section 360bbb-3(b)(1), unless the authorization is  terminated or revoked sooner.     Influenza A by PCR NEGATIVE NEGATIVE Final   Influenza B by PCR NEGATIVE NEGATIVE Final    Comment: (NOTE) The Xpert Xpress SARS-CoV-2/FLU/RSV assay is intended as an aid in  the diagnosis of influenza from Nasopharyngeal swab specimens and  should not be used as a sole basis for treatment. Nasal washings and  aspirates are unacceptable for Xpert Xpress SARS-CoV-2/FLU/RSV  testing.  Fact Sheet for Patients: PinkCheek.be  Fact Sheet for Healthcare Providers: GravelBags.it  This test is not yet approved or cleared by the [place] FDA and  has been authorized for detection and/or diagnosis of SARS-CoV-2 by  FDA under an Emergency Use Authorization (EUA). This EUA will remain  in effect (meaning this test can be used) for the duration of the  Covid-19 declaration under Section 564(b)(1) of the Act, 21  U.S.C. section 360bbb-3(b)(1), unless the authorization is  terminated or revoked.    Respiratory Syncytial Virus by PCR NEGATIVE NEGATIVE Final    Comment: (NOTE) Fact Sheet for Patients: PinkCheek.be  Fact Sheet for Healthcare Providers: GravelBags.it  This test is not yet approved or cleared by the [place] FDA and  has been authorized for detection and/or diagnosis of SARS-CoV-2 by  FDA under an Emergency Use Authorization (EUA). This EUA will remain  in effect (meaning this test can be used) for the duration of the  COVID-19 declaration under Section 564(b)(1) of the Act, 21 U.S.C.  section 360bbb-3(b)(1), unless the authorization is terminated or  revoked. Performed at [facility], [place] [address], [place], [place] [address]          Radiology Studies: IR Angiogram Visceral Selective  Result Date: 04/27/2020 INDICATION: 84 year old female with acute lower gastrointestinal hemorrhage  visualized on CT a the day prior from the transverse colon. Prior mesenteric angiogram without evidence of active hemorrhage. Presents with repeat hematochezia and acute anemia. EXAM: 1.  Ultrasound-guided right common femoral vein access 2. Selective catheterization of the superior mesenteric artery 3. Selective catheterization of the middle colic artery 4. Super selective catheterization of a distal branch of the middle colic artery 5. Mesenteric angiography MEDICATIONS: None ANESTHESIA/SEDATION: Moderate (conscious) sedation was employed during this procedure. A total of Versed 1 mg and Fentanyl 25 mcg was administered intravenously. Moderate Sedation Time: 40 minutes. The patient's level of consciousness and vital signs were monitored continuously by radiology nursing throughout the procedure under my direct supervision. CONTRAST:  51mL OMNIPAQUE IOHEXOL 300 MG/ML SOLN, 75mL OMNIPAQUE IOHEXOL 300 MG/ML SOLN, 76mL OMNIPAQUE IOHEXOL 300 MG/ML SOLN FLUOROSCOPY TIME:  Fluoroscopy Time: 8 minutes 30 seconds (510 mGy). COMPLICATIONS: None immediate. PROCEDURE: Informed consent was obtained from the patient following explanation of the procedure, risks, benefits and alternatives. The patient understands, agrees and consents for the procedure. All questions were addressed. A time out was performed prior to the initiation of the procedure. Maximal barrier sterile technique utilized including caps, mask, sterile gowns, sterile gloves, large sterile drape, hand hygiene, and Betadine prep. The right groin was prepped and draped in standard fashion. The inferior aspect of the femoral head was marked radiographically. Under direct ultrasound guidance, the distal common femoral vein was accessed with a micropuncture needle. The micropuncture sheath was placed and hand injection of contrast was performed in a is lateral oblique to delineate appropriate puncture site for closure device usage. A Bentson wire was directed to the  abdominal aorta. A 5 French, levin cm sheath was placed in the right common femoral vein. Over the Bentson wire, a 5 French C2 catheter was directed to the superior mesenteric artery ostium. Superior mesenteric angiogram was performed in the lateral projection. The superior mesenteric artery is widely patent with visualization of the middle colic artery arising anteriorly and coursing superiorly. Through the C2 catheter a J tipped direction microcatheter and single angle GT 0.018 inch wire were used to select the middle colic artery. Angiogram was performed from middle colic artery which demonstrated patency and no evidence of active extravasation. Angiogram was repeated in multiple obliquities to fully evaluate for possible hemorrhage however none was seen. A branch of the middle colic artery supplying the transverse colon was then selected with the microcatheter and wire. Repeat angiogram was performed from this location, still without apparent hemorrhage. The catheters were removed. A 6 French Angio-Seal device was used to close the right common femoral artery. The patient tolerated the procedure well without immediate complication. IMPRESSION: No evidence of active hemorrhage in the middle colic artery territory as was visualized on CTA the day prior. [name], MD [facility] Radiology Electronically Signed   By: [name] MD   On: 04/27/2020 16:25   IR Angiogram Visceral Selective  Result Date: 04/27/2020 INDICATION: 84 year old with GI bleeding. CTA demonstrated focal hemorrhage in the distal transverse colon region. Patient was consented for mesenteric arteriogram and possible embolization. EXAM: MESENTERIC ARTERIOGRAM: SMA AND IMA ULTRASOUND GUIDANCE FOR VASCULAR ACCESS MEDICATIONS: Ciprofloxacin 400 mg. The antibiotic was administered within 1 hour of the procedure ANESTHESIA/SEDATION: Moderate (conscious) sedation was employed during this  procedure. A total of Versed 1.0 mg and Fentanyl 25 mcg was administered intravenously. Moderate Sedation Time: 98 minutes. The patient's level of consciousness and vital signs were monitored continuously by radiology nursing throughout the procedure under my direct supervision. CONTRAST:  200 mg Omnipaque 300 FLUOROSCOPY TIME:  Fluoroscopy Time: 25 minutes, 36 seconds, 381 COMPLICATIONS: None immediate. PROCEDURE: Informed  consent was obtained from the patient following explanation of the procedure, risks, benefits and alternatives. The patient understands, agrees and consents for the procedure. All questions were addressed. A time out was performed prior to the initiation of the procedure. Maximal barrier sterile technique utilized including caps, mask, sterile gowns, sterile gloves, large sterile drape, hand hygiene, and Betadine prep. Fluoroscopy demonstrated that the urinary bladder was massively distended with contrast. Therefore, Foley catheter was performed prior to the procedure. Ciprofloxacin was given due to the Foley catheter placement. Ultrasound demonstrated a patent right common femoral artery. Ultrasound image was saved for documentation. Right groin was prepped and draped in sterile fashion. Skin was anesthetized using 1% lidocaine. Small incision was made. Using ultrasound guidance, 21 gauge needle was directed into the right common femoral artery and a micropuncture dilator set was placed. Five French vascular sheath was placed. A Sos catheter was used to cannulate the SMA. SMA arteriograms were performed. Multiple attempts were made to cannulate the middle colic artery using a microcatheter but this was unsuccessful. Attention was directed to the IMA. It was difficult to cannulate the IMA but it was eventually cannulated after the IMA was identified using a lateral projection and angiography. IMA injections were performed with attention to the left upper abdomen at the area of concern. No active  bleeding was identified. The SMA was cannulated again using a RIM catheter. SMA arteriogram was performed. No active bleeding was identified. Again, attempted to cannulate the middle colic artery with a microcatheter but unable to cannulate the vessel. Five French catheter was removed over a Bentson wire. Angiography was performed through the right groin sheath. Right groin sheath was removed using an ExoSeal closure device. Right groin hemostasis at the end of the procedure. Bandage placed over the puncture site. FINDINGS: SMA is widely patent. No evidence for active bleeding. Attention was directed to the left upper quadrant in the distal transverse colon region. Middle colic artery appears to come off anteriorly but this could never be successfully cannulated. IMA is patent. Attention was directed to the left colic artery distribution. No significant filling of the distal transverse colon from the IMA distribution. No evidence of active bleeding from the IMA. IMPRESSION: Angiography of the superior mesenteric artery and inferior mesenteric artery. No evidence for active bleeding. Electronically Signed   By: Markus Daft M.D.   On: 04/27/2020 08:02   IR Angiogram Visceral Selective  Result Date: 04/27/2020 INDICATION: 84 year old with GI bleeding. CTA demonstrated focal hemorrhage in the distal transverse colon region. Patient was consented for mesenteric arteriogram and possible embolization. EXAM: MESENTERIC ARTERIOGRAM: SMA AND IMA ULTRASOUND GUIDANCE FOR VASCULAR ACCESS MEDICATIONS: Ciprofloxacin 400 mg. The antibiotic was administered within 1 hour of the procedure ANESTHESIA/SEDATION: Moderate (conscious) sedation was employed during this procedure. A total of Versed 1.0 mg and Fentanyl 25 mcg was administered intravenously. Moderate Sedation Time: 98 minutes. The patient's level of consciousness and vital signs were monitored continuously by radiology nursing throughout the procedure under my direct  supervision. CONTRAST:  200 mg Omnipaque 300 FLUOROSCOPY TIME:  Fluoroscopy Time: 25 minutes, 36 seconds, 629 COMPLICATIONS: None immediate. PROCEDURE: Informed consent was obtained from the patient following explanation of the procedure, risks, benefits and alternatives. The patient understands, agrees and consents for the procedure. All questions were addressed. A time out was performed prior to the initiation of the procedure. Maximal barrier sterile technique utilized including caps, mask, sterile gowns, sterile gloves, large sterile drape, hand hygiene, and Betadine prep. Fluoroscopy demonstrated that  the urinary bladder was massively distended with contrast. Therefore, Foley catheter was performed prior to the procedure. Ciprofloxacin was given due to the Foley catheter placement. Ultrasound demonstrated a patent right common femoral artery. Ultrasound image was saved for documentation. Right groin was prepped and draped in sterile fashion. Skin was anesthetized using 1% lidocaine. Small incision was made. Using ultrasound guidance, 21 gauge needle was directed into the right common femoral artery and a micropuncture dilator set was placed. Five French vascular sheath was placed. A Sos catheter was used to cannulate the SMA. SMA arteriograms were performed. Multiple attempts were made to cannulate the middle colic artery using a microcatheter but this was unsuccessful. Attention was directed to the IMA. It was difficult to cannulate the IMA but it was eventually cannulated after the IMA was identified using a lateral projection and angiography. IMA injections were performed with attention to the left upper abdomen at the area of concern. No active bleeding was identified. The SMA was cannulated again using a RIM catheter. SMA arteriogram was performed. No active bleeding was identified. Again, attempted to cannulate the middle colic artery with a microcatheter but unable to cannulate the vessel. Five French  catheter was removed over a Bentson wire. Angiography was performed through the right groin sheath. Right groin sheath was removed using an ExoSeal closure device. Right groin hemostasis at the end of the procedure. Bandage placed over the puncture site. FINDINGS: SMA is widely patent. No evidence for active bleeding. Attention was directed to the left upper quadrant in the distal transverse colon region. Middle colic artery appears to come off anteriorly but this could never be successfully cannulated. IMA is patent. Attention was directed to the left colic artery distribution. No significant filling of the distal transverse colon from the IMA distribution. No evidence of active bleeding from the IMA. IMPRESSION: Angiography of the superior mesenteric artery and inferior mesenteric artery. No evidence for active bleeding. Electronically Signed   By: Markus Daft M.D.   On: 04/27/2020 08:02   IR Angiogram Selective Each Additional Vessel  Result Date: 04/27/2020 INDICATION: 84 year old female with acute lower gastrointestinal hemorrhage visualized on CT a the day prior from the transverse colon. Prior mesenteric angiogram without evidence of active hemorrhage. Presents with repeat hematochezia and acute anemia. EXAM: 1. Ultrasound-guided right common femoral vein access 2. Selective catheterization of the superior mesenteric artery 3. Selective catheterization of the middle colic artery 4. Super selective catheterization of a distal branch of the middle colic artery 5. Mesenteric angiography MEDICATIONS: None ANESTHESIA/SEDATION: Moderate (conscious) sedation was employed during this procedure. A total of Versed 1 mg and Fentanyl 25 mcg was administered intravenously. Moderate Sedation Time: 40 minutes. The patient's level of consciousness and vital signs were monitored continuously by radiology nursing throughout the procedure under my direct supervision. CONTRAST:  68mL OMNIPAQUE IOHEXOL 300 MG/ML SOLN, 99mL  OMNIPAQUE IOHEXOL 300 MG/ML SOLN, 67mL OMNIPAQUE IOHEXOL 300 MG/ML SOLN FLUOROSCOPY TIME:  Fluoroscopy Time: 8 minutes 30 seconds (510 mGy). COMPLICATIONS: None immediate. PROCEDURE: Informed consent was obtained from the patient following explanation of the procedure, risks, benefits and alternatives. The patient understands, agrees and consents for the procedure. All questions were addressed. A time out was performed prior to the initiation of the procedure. Maximal barrier sterile technique utilized including caps, mask, sterile gowns, sterile gloves, large sterile drape, hand hygiene, and Betadine prep. The right groin was prepped and draped in standard fashion. The inferior aspect of the femoral head was marked radiographically. Under direct  ultrasound guidance, the distal common femoral vein was accessed with a micropuncture needle. The micropuncture sheath was placed and hand injection of contrast was performed in a is lateral oblique to delineate appropriate puncture site for closure device usage. A Bentson wire was directed to the abdominal aorta. A 5 French, levin cm sheath was placed in the right common femoral vein. Over the Bentson wire, a 5 French C2 catheter was directed to the superior mesenteric artery ostium. Superior mesenteric angiogram was performed in the lateral projection. The superior mesenteric artery is widely patent with visualization of the middle colic artery arising anteriorly and coursing superiorly. Through the C2 catheter a J tipped direction microcatheter and single angle GT 0.018 inch wire were used to select the middle colic artery. Angiogram was performed from middle colic artery which demonstrated patency and no evidence of active extravasation. Angiogram was repeated in multiple obliquities to fully evaluate for possible hemorrhage however none was seen. A branch of the middle colic artery supplying the transverse colon was then selected with the microcatheter and wire. Repeat  angiogram was performed from this location, still without apparent hemorrhage. The catheters were removed. A 6 French Angio-Seal device was used to close the right common femoral artery. The patient tolerated the procedure well without immediate complication. IMPRESSION: No evidence of active hemorrhage in the middle colic artery territory as was visualized on CTA the day prior. [name], MD [facility] Radiology Electronically Signed   By: [name] MD   On: 04/27/2020 16:25   IR Angiogram Selective Each Additional Vessel  Result Date: 04/27/2020 INDICATION: 84 year old female with acute lower gastrointestinal hemorrhage visualized on CT a the day prior from the transverse colon. Prior mesenteric angiogram without evidence of active hemorrhage. Presents with repeat hematochezia and acute anemia. EXAM: 1. Ultrasound-guided right common femoral vein access 2. Selective catheterization of the superior mesenteric artery 3. Selective catheterization of the middle colic artery 4. Super selective catheterization of a distal branch of the middle colic artery 5. Mesenteric angiography MEDICATIONS: None ANESTHESIA/SEDATION: Moderate (conscious) sedation was employed during this procedure. A total of Versed 1 mg and Fentanyl 25 mcg was administered intravenously. Moderate Sedation Time: 40 minutes. The patient's level of consciousness and vital signs were monitored continuously by radiology nursing throughout the procedure under my direct supervision. CONTRAST:  53mL OMNIPAQUE IOHEXOL 300 MG/ML SOLN, 79mL OMNIPAQUE IOHEXOL 300 MG/ML SOLN, 59mL OMNIPAQUE IOHEXOL 300 MG/ML SOLN FLUOROSCOPY TIME:  Fluoroscopy Time: 8 minutes 30 seconds (510 mGy). COMPLICATIONS: None immediate. PROCEDURE: Informed consent was obtained from the patient following explanation of the procedure, risks, benefits and alternatives. The patient understands, agrees and consents for the  procedure. All questions were addressed. A time out was performed prior to the initiation of the procedure. Maximal barrier sterile technique utilized including caps, mask, sterile gowns, sterile gloves, large sterile drape, hand hygiene, and Betadine prep. The right groin was prepped and draped in standard fashion. The inferior aspect of the femoral head was marked radiographically. Under direct ultrasound guidance, the distal common femoral vein was accessed with a micropuncture needle. The micropuncture sheath was placed and hand injection of contrast was performed in a is lateral oblique to delineate appropriate puncture site for closure device usage. A Bentson wire was directed to the abdominal aorta. A 5 French, levin cm sheath was placed in the right common femoral vein. Over the Bentson wire, a 5 French C2 catheter was directed to the superior mesenteric artery ostium. Superior  mesenteric angiogram was performed in the lateral projection. The superior mesenteric artery is widely patent with visualization of the middle colic artery arising anteriorly and coursing superiorly. Through the C2 catheter a J tipped direction microcatheter and single angle GT 0.018 inch wire were used to select the middle colic artery. Angiogram was performed from middle colic artery which demonstrated patency and no evidence of active extravasation. Angiogram was repeated in multiple obliquities to fully evaluate for possible hemorrhage however none was seen. A branch of the middle colic artery supplying the transverse colon was then selected with the microcatheter and wire. Repeat angiogram was performed from this location, still without apparent hemorrhage. The catheters were removed. A 6 French Angio-Seal device was used to close the right common femoral artery. The patient tolerated the procedure well without immediate complication. IMPRESSION: No evidence of active hemorrhage in the middle colic artery territory as was  visualized on CTA the day prior. [name], MD [facility] Radiology Electronically Signed   By: [name] MD   On: 04/27/2020 16:25   IR Angiogram Selective Each Additional Vessel  Result Date: 04/27/2020 INDICATION: 84 year old female with acute lower gastrointestinal hemorrhage visualized on CT a the day prior from the transverse colon. Prior mesenteric angiogram without evidence of active hemorrhage. Presents with repeat hematochezia and acute anemia. EXAM: 1. Ultrasound-guided right common femoral vein access 2. Selective catheterization of the superior mesenteric artery 3. Selective catheterization of the middle colic artery 4. Super selective catheterization of a distal branch of the middle colic artery 5. Mesenteric angiography MEDICATIONS: None ANESTHESIA/SEDATION: Moderate (conscious) sedation was employed during this procedure. A total of Versed 1 mg and Fentanyl 25 mcg was administered intravenously. Moderate Sedation Time: 40 minutes. The patient's level of consciousness and vital signs were monitored continuously by radiology nursing throughout the procedure under my direct supervision. CONTRAST:  5mL OMNIPAQUE IOHEXOL 300 MG/ML SOLN, 22mL OMNIPAQUE IOHEXOL 300 MG/ML SOLN, 30mL OMNIPAQUE IOHEXOL 300 MG/ML SOLN FLUOROSCOPY TIME:  Fluoroscopy Time: 8 minutes 30 seconds (510 mGy). COMPLICATIONS: None immediate. PROCEDURE: Informed consent was obtained from the patient following explanation of the procedure, risks, benefits and alternatives. The patient understands, agrees and consents for the procedure. All questions were addressed. A time out was performed prior to the initiation of the procedure. Maximal barrier sterile technique utilized including caps, mask, sterile gowns, sterile gloves, large sterile drape, hand hygiene, and Betadine prep. The right groin was prepped and draped in standard fashion. The inferior aspect of the femoral head  was marked radiographically. Under direct ultrasound guidance, the distal common femoral vein was accessed with a micropuncture needle. The micropuncture sheath was placed and hand injection of contrast was performed in a is lateral oblique to delineate appropriate puncture site for closure device usage. A Bentson wire was directed to the abdominal aorta. A 5 French, levin cm sheath was placed in the right common femoral vein. Over the Bentson wire, a 5 French C2 catheter was directed to the superior mesenteric artery ostium. Superior mesenteric angiogram was performed in the lateral projection. The superior mesenteric artery is widely patent with visualization of the middle colic artery arising anteriorly and coursing superiorly. Through the C2 catheter a J tipped direction microcatheter and single angle GT 0.018 inch wire were used to select the middle colic artery. Angiogram was performed from middle colic artery which demonstrated patency and no evidence of active extravasation. Angiogram was repeated in multiple obliquities to fully evaluate for possible hemorrhage however  none was seen. A branch of the middle colic artery supplying the transverse colon was then selected with the microcatheter and wire. Repeat angiogram was performed from this location, still without apparent hemorrhage. The catheters were removed. A 6 French Angio-Seal device was used to close the right common femoral artery. The patient tolerated the procedure well without immediate complication. IMPRESSION: No evidence of active hemorrhage in the middle colic artery territory as was visualized on CTA the day prior. [name], MD [facility] Radiology Electronically Signed   By: [name] MD   On: 04/27/2020 16:25   IR US Guide Vasc Access Right  Result Date: 04/27/2020 INDICATION: 84 year old female with acute lower gastrointestinal hemorrhage visualized on CT a the day prior from  the transverse colon. Prior mesenteric angiogram without evidence of active hemorrhage. Presents with repeat hematochezia and acute anemia. EXAM: 1. Ultrasound-guided right common femoral vein access 2. Selective catheterization of the superior mesenteric artery 3. Selective catheterization of the middle colic artery 4. Super selective catheterization of a distal branch of the middle colic artery 5. Mesenteric angiography MEDICATIONS: None ANESTHESIA/SEDATION: Moderate (conscious) sedation was employed during this procedure. A total of Versed 1 mg and Fentanyl 25 mcg was administered intravenously. Moderate Sedation Time: 40 minutes. The patient's level of consciousness and vital signs were monitored continuously by radiology nursing throughout the procedure under my direct supervision. CONTRAST:  78mL OMNIPAQUE IOHEXOL 300 MG/ML SOLN, 34mL OMNIPAQUE IOHEXOL 300 MG/ML SOLN, 13mL OMNIPAQUE IOHEXOL 300 MG/ML SOLN FLUOROSCOPY TIME:  Fluoroscopy Time: 8 minutes 30 seconds (510 mGy). COMPLICATIONS: None immediate. PROCEDURE: Informed consent was obtained from the patient following explanation of the procedure, risks, benefits and alternatives. The patient understands, agrees and consents for the procedure. All questions were addressed. A time out was performed prior to the initiation of the procedure. Maximal barrier sterile technique utilized including caps, mask, sterile gowns, sterile gloves, large sterile drape, hand hygiene, and Betadine prep. The right groin was prepped and draped in standard fashion. The inferior aspect of the femoral head was marked radiographically. Under direct ultrasound guidance, the distal common femoral vein was accessed with a micropuncture needle. The micropuncture sheath was placed and hand injection of contrast was performed in a is lateral oblique to delineate appropriate puncture site for closure device usage. A Bentson wire was directed to the abdominal aorta. A 5 French, levin cm  sheath was placed in the right common femoral vein. Over the Bentson wire, a 5 French C2 catheter was directed to the superior mesenteric artery ostium. Superior mesenteric angiogram was performed in the lateral projection. The superior mesenteric artery is widely patent with visualization of the middle colic artery arising anteriorly and coursing superiorly. Through the C2 catheter a J tipped direction microcatheter and single angle GT 0.018 inch wire were used to select the middle colic artery. Angiogram was performed from middle colic artery which demonstrated patency and no evidence of active extravasation. Angiogram was repeated in multiple obliquities to fully evaluate for possible hemorrhage however none was seen. A branch of the middle colic artery supplying the transverse colon was then selected with the microcatheter and wire. Repeat angiogram was performed from this location, still without apparent hemorrhage. The catheters were removed. A 6 French Angio-Seal device was used to close the right common femoral artery. The patient tolerated the procedure well without immediate complication. IMPRESSION: No evidence of active hemorrhage in the middle colic artery territory as was visualized on CTA the day prior.  [name], MD [facility] Radiology Electronically Signed   By: [name] MD   On: 04/27/2020 16:25   IR US Guide Vasc Access Right  Result Date: 04/27/2020 INDICATION: 84 year old with GI bleeding. CTA demonstrated focal hemorrhage in the distal transverse colon region. Patient was consented for mesenteric arteriogram and possible embolization. EXAM: MESENTERIC ARTERIOGRAM: SMA AND IMA ULTRASOUND GUIDANCE FOR VASCULAR ACCESS MEDICATIONS: Ciprofloxacin 400 mg. The antibiotic was administered within 1 hour of the procedure ANESTHESIA/SEDATION: Moderate (conscious) sedation was employed during this procedure. A total of Versed 1.0 mg and  Fentanyl 25 mcg was administered intravenously. Moderate Sedation Time: 98 minutes. The patient's level of consciousness and vital signs were monitored continuously by radiology nursing throughout the procedure under my direct supervision. CONTRAST:  200 mg Omnipaque 300 FLUOROSCOPY TIME:  Fluoroscopy Time: 25 minutes, 36 seconds, 694 COMPLICATIONS: None immediate. PROCEDURE: Informed consent was obtained from the patient following explanation of the procedure, risks, benefits and alternatives. The patient understands, agrees and consents for the procedure. All questions were addressed. A time out was performed prior to the initiation of the procedure. Maximal barrier sterile technique utilized including caps, mask, sterile gowns, sterile gloves, large sterile drape, hand hygiene, and Betadine prep. Fluoroscopy demonstrated that the urinary bladder was massively distended with contrast. Therefore, Foley catheter was performed prior to the procedure. Ciprofloxacin was given due to the Foley catheter placement. Ultrasound demonstrated a patent right common femoral artery. Ultrasound image was saved for documentation. Right groin was prepped and draped in sterile fashion. Skin was anesthetized using 1% lidocaine. Small incision was made. Using ultrasound guidance, 21 gauge needle was directed into the right common femoral artery and a micropuncture dilator set was placed. Five French vascular sheath was placed. A Sos catheter was used to cannulate the SMA. SMA arteriograms were performed. Multiple attempts were made to cannulate the middle colic artery using a microcatheter but this was unsuccessful. Attention was directed to the IMA. It was difficult to cannulate the IMA but it was eventually cannulated after the IMA was identified using a lateral projection and angiography. IMA injections were performed with attention to the left upper abdomen at the area of concern. No active bleeding was identified. The SMA was  cannulated again using a RIM catheter. SMA arteriogram was performed. No active bleeding was identified. Again, attempted to cannulate the middle colic artery with a microcatheter but unable to cannulate the vessel. Five French catheter was removed over a Bentson wire. Angiography was performed through the right groin sheath. Right groin sheath was removed using an ExoSeal closure device. Right groin hemostasis at the end of the procedure. Bandage placed over the puncture site. FINDINGS: SMA is widely patent. No evidence for active bleeding. Attention was directed to the left upper quadrant in the distal transverse colon region. Middle colic artery appears to come off anteriorly but this could never be successfully cannulated. IMA is patent. Attention was directed to the left colic artery distribution. No significant filling of the distal transverse colon from the IMA distribution. No evidence of active bleeding from the IMA. IMPRESSION: Angiography of the superior mesenteric artery and inferior mesenteric artery. No evidence for active bleeding. Electronically Signed   By: Markus Daft M.D.   On: 04/27/2020 08:02   CT Angio Abd/Pel w/ and/or w/o  Result Date: 04/26/2020 CLINICAL DATA:  84 year old female with history of diverticulosis and intestinal hemorrhage. EXAM: CTA ABDOMEN AND PELVIS WITHOUT AND WITH CONTRAST TECHNIQUE: Multidetector CT imaging  of the abdomen and pelvis was performed using the standard protocol during bolus administration of intravenous contrast. Multiplanar reconstructed images and MIPs were obtained and reviewed to evaluate the vascular anatomy. CONTRAST:  182mL OMNIPAQUE IOHEXOL 350 MG/ML SOLN COMPARISON:  None. 02/14/2019 FINDINGS: VASCULAR Aorta: Normal caliber and patent throughout. Scattered fibrofatty and calcific atherosclerotic changes most prominent in the distal segment. Celiac: Patent without evidence of aneurysm, dissection, vasculitis or significant stenosis. SMA: Patent  without evidence of aneurysm, dissection, vasculitis or significant stenosis. Renals: Single bilateral renal arteries are patent without evidence of aneurysm, dissection, vasculitis, fibromuscular dysplasia or significant stenosis. IMA: Patent without evidence of aneurysm, dissection, vasculitis or significant stenosis. Inflow: Patent without evidence of aneurysm, dissection, vasculitis or significant stenosis. Proximal Outflow: Bilateral common femoral and visualized portions of the superficial and profunda femoral arteries are patent without evidence of aneurysm, dissection, vasculitis or significant stenosis. Veins: No obvious venous abnormality. Review of the MIP images confirms the above findings. NON-VASCULAR Lower chest: Bibasilar subsegmental atelectasis. Heart is normal in size. No pericardial effusion. Hepatobiliary: The liver is normal in size, contour, and attenuation. No focal lesion. The gallbladder is present with layering sludge versus vicarious contrast excretion. No pericholecystic fluid, gallbladder wall thickening, or cholelithiasis. No intra or extrahepatic biliary ductal dilation. Pancreas: Coarse calcification within the pancreatic head. No pancreatic ductal dilatation or surrounding inflammatory changes. Spleen: Normal in size without focal abnormality. Adrenals/Urinary Tract: Adrenal glands are unremarkable. Kidneys are normal, without renal calculi, focal lesion, or hydronephrosis. Bladder is unremarkable. Stomach/Bowel: Focus of active extravasation the distal transverse colon. Stomach is within normal limits. Appendix appears normal. Scattered colonic diverticula. No evidence of bowel wall thickening, distention, or inflammatory changes. Lymphatic: No enlarged abdominal or pelvic lymph nodes. Reproductive: Status post hysterectomy. No adnexal masses. Other: No abdominal wall hernia or abnormality. No abdominopelvic ascites. Musculoskeletal: Multilevel degenerative changes of the  visualized thoracolumbar spine. No acute osseous abnormality. IMPRESSION: VASCULAR 1. Focal active hemorrhage within the distal transverse colon. 2.  Aortic Atherosclerosis (ICD10-I70.0). NON-VASCULAR 1. Diverticulosis without evidence of diverticulitis. Recommend urgent Interventional Radiology consultation. These results were called by telephone at the time of interpretation on 04/26/2020 at 4:20 pm to provider Dr. Marylyn Ishihara, Who verbally acknowledged these results. [name], MD [facility] Radiology Electronically Signed   By: [name] MD   On: 04/26/2020 16:25        Scheduled Meds: . feeding supplement  1 Container Oral TID BM  . ferrous sulfate  325 mg Oral Q breakfast  . potassium chloride  40 mEq Oral Q4H  . sodium chloride flush  3 mL Intravenous Q12H   Continuous Infusions: . sodium chloride 75 mL/hr at 04/27/20 1126     LOS: 2 days    Time spent: 35 mins.More than 50% of that time was spent in counseling and/or coordination of care.      Shelly Coss, MD [facility] P10/23/2021, 9:01 AM

## 2020-04-28 NOTE — Progress Notes (Signed)
Referring Physician(s): Danis,H  Supervising Physician: Arne Cleveland  Patient Status:  Latoya Bennett - In-pt  Chief Complaint: Rectal bleeding   Subjective: Pt doing ok this am; no further reported rectal bleeding since f/u angio yesterday; denies abd pain,N/V   Allergies: Patient has no known allergies.  Medications: Prior to Admission medications   Medication Sig Start Date End Date Taking? Authorizing Provider  amLODipine (NORVASC) 10 MG tablet Take 1 tablet (10 mg total) by mouth daily. 08/22/19 04/26/20 Yes Sagardia, Ines Bloomer, MD  FEROSUL 325 (65 Fe) MG tablet TAKE 2 TABLETS(650 MG) BY MOUTH EVERY OTHER DAY Patient taking differently: Take 325 mg by mouth daily with breakfast.  08/01/19  Yes Cirigliano, Vito V, DO  Multiple Vitamin (MULTIVITAMIN WITH MINERALS) TABS tablet Take 1 tablet by mouth daily. 02/18/19  Yes Sheikh, [name], DO  Potassium 99 MG TABS Take 99 mg by mouth daily.   Yes [provider]  famotidine (PEPCID) 20 MG tablet Take 1 tablet (20 mg total) by mouth daily. Patient not taking: Reported on 04/26/2020 02/18/19   Raiford Noble Latif, DO  feeding supplement, ENSURE ENLIVE, (ENSURE ENLIVE) LIQD Take 237 mLs by mouth 2 (two) times daily between meals. Patient not taking: Reported on 04/26/2020 02/17/19   Raiford Noble Latif, DO     Vital Signs: BP (!) 122/58 (BP Location: Right Arm)   Pulse 77   Temp 98.5 F (36.9 C) (Oral)   Resp 18   Ht 4\' 11"  (1.499 m)   Wt 125 lb (56.7 kg)   SpO2 100%   BMI 25.25 kg/m   Physical Exam awake/alert; abd soft,NT; rt CFA access site soft, clean/dry,NT, no hematoma  Imaging: IR Angiogram Visceral Selective  Result Date: 04/27/2020 INDICATION: 84 year old female with acute lower gastrointestinal hemorrhage visualized on CT a the day prior from the transverse colon. Prior mesenteric angiogram without evidence of active hemorrhage. Presents with repeat hematochezia and acute anemia. EXAM: 1.  Ultrasound-guided right common femoral vein access 2. Selective catheterization of the superior mesenteric artery 3. Selective catheterization of the middle colic artery 4. Super selective catheterization of a distal branch of the middle colic artery 5. Mesenteric angiography MEDICATIONS: None ANESTHESIA/SEDATION: Moderate (conscious) sedation was employed during this procedure. A total of Versed 1 mg and Fentanyl 25 mcg was administered intravenously. Moderate Sedation Time: 40 minutes. The patient's level of consciousness and vital signs were monitored continuously by radiology nursing throughout the procedure under my direct supervision. CONTRAST:  34mL OMNIPAQUE IOHEXOL 300 MG/ML SOLN, 16mL OMNIPAQUE IOHEXOL 300 MG/ML SOLN, 46mL OMNIPAQUE IOHEXOL 300 MG/ML SOLN FLUOROSCOPY TIME:  Fluoroscopy Time: 8 minutes 30 seconds (510 mGy). COMPLICATIONS: None immediate. PROCEDURE: Informed consent was obtained from the patient following explanation of the procedure, risks, benefits and alternatives. The patient understands, agrees and consents for the procedure. All questions were addressed. A time out was performed prior to the initiation of the procedure. Maximal barrier sterile technique utilized including caps, mask, sterile gowns, sterile gloves, large sterile drape, hand hygiene, and Betadine prep. The right groin was prepped and draped in standard fashion. The inferior aspect of the femoral head was marked radiographically. Under direct ultrasound guidance, the distal common femoral vein was accessed with a micropuncture needle. The micropuncture sheath was placed and hand injection of contrast was performed in a is lateral oblique to delineate appropriate puncture site for closure device usage. A Bentson wire was directed to the abdominal aorta. A 5 French, levin cm sheath was placed in the  right common femoral vein. Over the Bentson wire, a 5 French C2 catheter was directed to the superior mesenteric artery  ostium. Superior mesenteric angiogram was performed in the lateral projection. The superior mesenteric artery is widely patent with visualization of the middle colic artery arising anteriorly and coursing superiorly. Through the C2 catheter a J tipped direction microcatheter and single angle GT 0.018 inch wire were used to select the middle colic artery. Angiogram was performed from middle colic artery which demonstrated patency and no evidence of active extravasation. Angiogram was repeated in multiple obliquities to fully evaluate for possible hemorrhage however none was seen. A branch of the middle colic artery supplying the transverse colon was then selected with the microcatheter and wire. Repeat angiogram was performed from this location, still without apparent hemorrhage. The catheters were removed. A 6 French Angio-Seal device was used to close the right common femoral artery. The patient tolerated the procedure well without immediate complication. IMPRESSION: No evidence of active hemorrhage in the middle colic artery territory as was visualized on CTA the day prior. Latoya Cancer, MD [facility] Radiology Electronically Signed   By: Latoya Cancer MD   On: 04/27/2020 16:25   IR Angiogram Visceral Selective  Result Date: 04/27/2020 INDICATION: 84 year old with GI bleeding. CTA demonstrated focal hemorrhage in the distal transverse colon region. Patient was consented for mesenteric arteriogram and possible embolization. EXAM: MESENTERIC ARTERIOGRAM: SMA AND IMA ULTRASOUND GUIDANCE FOR VASCULAR ACCESS MEDICATIONS: Ciprofloxacin 400 mg. The antibiotic was administered within 1 hour of the procedure ANESTHESIA/SEDATION: Moderate (conscious) sedation was employed during this procedure. A total of Versed 1.0 mg and Fentanyl 25 mcg was administered intravenously. Moderate Sedation Time: 98 minutes. The patient's level of consciousness and vital signs were  monitored continuously by radiology nursing throughout the procedure under my direct supervision. CONTRAST:  200 mg Omnipaque 300 FLUOROSCOPY TIME:  Fluoroscopy Time: 25 minutes, 36 seconds, 062 COMPLICATIONS: None immediate. PROCEDURE: Informed consent was obtained from the patient following explanation of the procedure, risks, benefits and alternatives. The patient understands, agrees and consents for the procedure. All questions were addressed. A time out was performed prior to the initiation of the procedure. Maximal barrier sterile technique utilized including caps, mask, sterile gowns, sterile gloves, large sterile drape, hand hygiene, and Betadine prep. Fluoroscopy demonstrated that the urinary bladder was massively distended with contrast. Therefore, Foley catheter was performed prior to the procedure. Ciprofloxacin was given due to the Foley catheter placement. Ultrasound demonstrated a patent right common femoral artery. Ultrasound image was saved for documentation. Right groin was prepped and draped in sterile fashion. Skin was anesthetized using 1% lidocaine. Small incision was made. Using ultrasound guidance, 21 gauge needle was directed into the right common femoral artery and a micropuncture dilator set was placed. Five French vascular sheath was placed. A Sos catheter was used to cannulate the SMA. SMA arteriograms were performed. Multiple attempts were made to cannulate the middle colic artery using a microcatheter but this was unsuccessful. Attention was directed to the IMA. It was difficult to cannulate the IMA but it was eventually cannulated after the IMA was identified using a lateral projection and angiography. IMA injections were performed with attention to the left upper abdomen at the area of concern. No active bleeding was identified. The SMA was cannulated again using a RIM catheter. SMA arteriogram was performed. No active bleeding was identified. Again, attempted to cannulate the middle  colic artery with a microcatheter but unable to cannulate the  vessel. Five French catheter was removed over a Bentson wire. Angiography was performed through the right groin sheath. Right groin sheath was removed using an ExoSeal closure device. Right groin hemostasis at the end of the procedure. Bandage placed over the puncture site. FINDINGS: SMA is widely patent. No evidence for active bleeding. Attention was directed to the left upper quadrant in the distal transverse colon region. Middle colic artery appears to come off anteriorly but this could never be successfully cannulated. IMA is patent. Attention was directed to the left colic artery distribution. No significant filling of the distal transverse colon from the IMA distribution. No evidence of active bleeding from the IMA. IMPRESSION: Angiography of the superior mesenteric artery and inferior mesenteric artery. No evidence for active bleeding. Electronically Signed   By: Markus Daft M.D.   On: 04/27/2020 08:02   IR Angiogram Visceral Selective  Result Date: 04/27/2020 INDICATION: 84 year old with GI bleeding. CTA demonstrated focal hemorrhage in the distal transverse colon region. Patient was consented for mesenteric arteriogram and possible embolization. EXAM: MESENTERIC ARTERIOGRAM: SMA AND IMA ULTRASOUND GUIDANCE FOR VASCULAR ACCESS MEDICATIONS: Ciprofloxacin 400 mg. The antibiotic was administered within 1 hour of the procedure ANESTHESIA/SEDATION: Moderate (conscious) sedation was employed during this procedure. A total of Versed 1.0 mg and Fentanyl 25 mcg was administered intravenously. Moderate Sedation Time: 98 minutes. The patient's level of consciousness and vital signs were monitored continuously by radiology nursing throughout the procedure under my direct supervision. CONTRAST:  200 mg Omnipaque 300 FLUOROSCOPY TIME:  Fluoroscopy Time: 25 minutes, 36 seconds, 825 COMPLICATIONS: None immediate. PROCEDURE: Informed consent was obtained from  the patient following explanation of the procedure, risks, benefits and alternatives. The patient understands, agrees and consents for the procedure. All questions were addressed. A time out was performed prior to the initiation of the procedure. Maximal barrier sterile technique utilized including caps, mask, sterile gowns, sterile gloves, large sterile drape, hand hygiene, and Betadine prep. Fluoroscopy demonstrated that the urinary bladder was massively distended with contrast. Therefore, Foley catheter was performed prior to the procedure. Ciprofloxacin was given due to the Foley catheter placement. Ultrasound demonstrated a patent right common femoral artery. Ultrasound image was saved for documentation. Right groin was prepped and draped in sterile fashion. Skin was anesthetized using 1% lidocaine. Small incision was made. Using ultrasound guidance, 21 gauge needle was directed into the right common femoral artery and a micropuncture dilator set was placed. Five French vascular sheath was placed. A Sos catheter was used to cannulate the SMA. SMA arteriograms were performed. Multiple attempts were made to cannulate the middle colic artery using a microcatheter but this was unsuccessful. Attention was directed to the IMA. It was difficult to cannulate the IMA but it was eventually cannulated after the IMA was identified using a lateral projection and angiography. IMA injections were performed with attention to the left upper abdomen at the area of concern. No active bleeding was identified. The SMA was cannulated again using a RIM catheter. SMA arteriogram was performed. No active bleeding was identified. Again, attempted to cannulate the middle colic artery with a microcatheter but unable to cannulate the vessel. Five French catheter was removed over a Bentson wire. Angiography was performed through the right groin sheath. Right groin sheath was removed using an ExoSeal closure device. Right groin hemostasis at  the end of the procedure. Bandage placed over the puncture site. FINDINGS: SMA is widely patent. No evidence for active bleeding. Attention was directed to the left upper quadrant in the  distal transverse colon region. Middle colic artery appears to come off anteriorly but this could never be successfully cannulated. IMA is patent. Attention was directed to the left colic artery distribution. No significant filling of the distal transverse colon from the IMA distribution. No evidence of active bleeding from the IMA. IMPRESSION: Angiography of the superior mesenteric artery and inferior mesenteric artery. No evidence for active bleeding. Electronically Signed   By: Markus Daft M.D.   On: 04/27/2020 08:02   IR Angiogram Selective Each Additional Vessel  Result Date: 04/27/2020 INDICATION: 84 year old female with acute lower gastrointestinal hemorrhage visualized on CT a the day prior from the transverse colon. Prior mesenteric angiogram without evidence of active hemorrhage. Presents with repeat hematochezia and acute anemia. EXAM: 1. Ultrasound-guided right common femoral vein access 2. Selective catheterization of the superior mesenteric artery 3. Selective catheterization of the middle colic artery 4. Super selective catheterization of a distal branch of the middle colic artery 5. Mesenteric angiography MEDICATIONS: None ANESTHESIA/SEDATION: Moderate (conscious) sedation was employed during this procedure. A total of Versed 1 mg and Fentanyl 25 mcg was administered intravenously. Moderate Sedation Time: 40 minutes. The patient's level of consciousness and vital signs were monitored continuously by radiology nursing throughout the procedure under my direct supervision. CONTRAST:  79mL OMNIPAQUE IOHEXOL 300 MG/ML SOLN, 79mL OMNIPAQUE IOHEXOL 300 MG/ML SOLN, 64mL OMNIPAQUE IOHEXOL 300 MG/ML SOLN FLUOROSCOPY TIME:  Fluoroscopy Time: 8 minutes 30 seconds (510 mGy). COMPLICATIONS: None immediate. PROCEDURE: Informed  consent was obtained from the patient following explanation of the procedure, risks, benefits and alternatives. The patient understands, agrees and consents for the procedure. All questions were addressed. A time out was performed prior to the initiation of the procedure. Maximal barrier sterile technique utilized including caps, mask, sterile gowns, sterile gloves, large sterile drape, hand hygiene, and Betadine prep. The right groin was prepped and draped in standard fashion. The inferior aspect of the femoral head was marked radiographically. Under direct ultrasound guidance, the distal common femoral vein was accessed with a micropuncture needle. The micropuncture sheath was placed and hand injection of contrast was performed in a is lateral oblique to delineate appropriate puncture site for closure device usage. A Bentson wire was directed to the abdominal aorta. A 5 French, levin cm sheath was placed in the right common femoral vein. Over the Bentson wire, a 5 French C2 catheter was directed to the superior mesenteric artery ostium. Superior mesenteric angiogram was performed in the lateral projection. The superior mesenteric artery is widely patent with visualization of the middle colic artery arising anteriorly and coursing superiorly. Through the C2 catheter a J tipped direction microcatheter and single angle GT 0.018 inch wire were used to select the middle colic artery. Angiogram was performed from middle colic artery which demonstrated patency and no evidence of active extravasation. Angiogram was repeated in multiple obliquities to fully evaluate for possible hemorrhage however none was seen. A branch of the middle colic artery supplying the transverse colon was then selected with the microcatheter and wire. Repeat angiogram was performed from this location, still without apparent hemorrhage. The catheters were removed. A 6 French Angio-Seal device was used to close the right common femoral artery. The  patient tolerated the procedure well without immediate complication. IMPRESSION: No evidence of active hemorrhage in the middle colic artery territory as was visualized on CTA the day prior. Latoya Cancer, MD [facility] Radiology Electronically Signed   By: Latoya Cancer MD   On: 04/27/2020  16:25   IR Angiogram Selective Each Additional Vessel  Result Date: 04/27/2020 INDICATION: 84 year old female with acute lower gastrointestinal hemorrhage visualized on CT a the day prior from the transverse colon. Prior mesenteric angiogram without evidence of active hemorrhage. Presents with repeat hematochezia and acute anemia. EXAM: 1. Ultrasound-guided right common femoral vein access 2. Selective catheterization of the superior mesenteric artery 3. Selective catheterization of the middle colic artery 4. Super selective catheterization of a distal branch of the middle colic artery 5. Mesenteric angiography MEDICATIONS: None ANESTHESIA/SEDATION: Moderate (conscious) sedation was employed during this procedure. A total of Versed 1 mg and Fentanyl 25 mcg was administered intravenously. Moderate Sedation Time: 40 minutes. The patient's level of consciousness and vital signs were monitored continuously by radiology nursing throughout the procedure under my direct supervision. CONTRAST:  38mL OMNIPAQUE IOHEXOL 300 MG/ML SOLN, 79mL OMNIPAQUE IOHEXOL 300 MG/ML SOLN, 56mL OMNIPAQUE IOHEXOL 300 MG/ML SOLN FLUOROSCOPY TIME:  Fluoroscopy Time: 8 minutes 30 seconds (510 mGy). COMPLICATIONS: None immediate. PROCEDURE: Informed consent was obtained from the patient following explanation of the procedure, risks, benefits and alternatives. The patient understands, agrees and consents for the procedure. All questions were addressed. A time out was performed prior to the initiation of the procedure. Maximal barrier sterile technique utilized including caps, mask, sterile gowns, sterile  gloves, large sterile drape, hand hygiene, and Betadine prep. The right groin was prepped and draped in standard fashion. The inferior aspect of the femoral head was marked radiographically. Under direct ultrasound guidance, the distal common femoral vein was accessed with a micropuncture needle. The micropuncture sheath was placed and hand injection of contrast was performed in a is lateral oblique to delineate appropriate puncture site for closure device usage. A Bentson wire was directed to the abdominal aorta. A 5 French, levin cm sheath was placed in the right common femoral vein. Over the Bentson wire, a 5 French C2 catheter was directed to the superior mesenteric artery ostium. Superior mesenteric angiogram was performed in the lateral projection. The superior mesenteric artery is widely patent with visualization of the middle colic artery arising anteriorly and coursing superiorly. Through the C2 catheter a J tipped direction microcatheter and single angle GT 0.018 inch wire were used to select the middle colic artery. Angiogram was performed from middle colic artery which demonstrated patency and no evidence of active extravasation. Angiogram was repeated in multiple obliquities to fully evaluate for possible hemorrhage however none was seen. A branch of the middle colic artery supplying the transverse colon was then selected with the microcatheter and wire. Repeat angiogram was performed from this location, still without apparent hemorrhage. The catheters were removed. A 6 French Angio-Seal device was used to close the right common femoral artery. The patient tolerated the procedure well without immediate complication. IMPRESSION: No evidence of active hemorrhage in the middle colic artery territory as was visualized on CTA the day prior. Latoya Cancer, MD [facility] Radiology Electronically Signed   By: Latoya Cancer MD   On: 04/27/2020 16:25   IR  Angiogram Selective Each Additional Vessel  Result Date: 04/27/2020 INDICATION: 84 year old female with acute lower gastrointestinal hemorrhage visualized on CT a the day prior from the transverse colon. Prior mesenteric angiogram without evidence of active hemorrhage. Presents with repeat hematochezia and acute anemia. EXAM: 1. Ultrasound-guided right common femoral vein access 2. Selective catheterization of the superior mesenteric artery 3. Selective catheterization of the middle colic artery 4. Super selective catheterization of a distal branch of  the middle colic artery 5. Mesenteric angiography MEDICATIONS: None ANESTHESIA/SEDATION: Moderate (conscious) sedation was employed during this procedure. A total of Versed 1 mg and Fentanyl 25 mcg was administered intravenously. Moderate Sedation Time: 40 minutes. The patient's level of consciousness and vital signs were monitored continuously by radiology nursing throughout the procedure under my direct supervision. CONTRAST:  7mL OMNIPAQUE IOHEXOL 300 MG/ML SOLN, 35mL OMNIPAQUE IOHEXOL 300 MG/ML SOLN, 59mL OMNIPAQUE IOHEXOL 300 MG/ML SOLN FLUOROSCOPY TIME:  Fluoroscopy Time: 8 minutes 30 seconds (510 mGy). COMPLICATIONS: None immediate. PROCEDURE: Informed consent was obtained from the patient following explanation of the procedure, risks, benefits and alternatives. The patient understands, agrees and consents for the procedure. All questions were addressed. A time out was performed prior to the initiation of the procedure. Maximal barrier sterile technique utilized including caps, mask, sterile gowns, sterile gloves, large sterile drape, hand hygiene, and Betadine prep. The right groin was prepped and draped in standard fashion. The inferior aspect of the femoral head was marked radiographically. Under direct ultrasound guidance, the distal common femoral vein was accessed with a micropuncture needle. The micropuncture sheath was placed and hand injection of  contrast was performed in a is lateral oblique to delineate appropriate puncture site for closure device usage. A Bentson wire was directed to the abdominal aorta. A 5 French, levin cm sheath was placed in the right common femoral vein. Over the Bentson wire, a 5 French C2 catheter was directed to the superior mesenteric artery ostium. Superior mesenteric angiogram was performed in the lateral projection. The superior mesenteric artery is widely patent with visualization of the middle colic artery arising anteriorly and coursing superiorly. Through the C2 catheter a J tipped direction microcatheter and single angle GT 0.018 inch wire were used to select the middle colic artery. Angiogram was performed from middle colic artery which demonstrated patency and no evidence of active extravasation. Angiogram was repeated in multiple obliquities to fully evaluate for possible hemorrhage however none was seen. A branch of the middle colic artery supplying the transverse colon was then selected with the microcatheter and wire. Repeat angiogram was performed from this location, still without apparent hemorrhage. The catheters were removed. A 6 French Angio-Seal device was used to close the right common femoral artery. The patient tolerated the procedure well without immediate complication. IMPRESSION: No evidence of active hemorrhage in the middle colic artery territory as was visualized on CTA the day prior. Latoya Cancer, MD [facility] Radiology Electronically Signed   By: Latoya Cancer MD   On: 04/27/2020 16:25   IR US Guide Vasc Access Right  Result Date: 04/27/2020 INDICATION: 84 year old female with acute lower gastrointestinal hemorrhage visualized on CT a the day prior from the transverse colon. Prior mesenteric angiogram without evidence of active hemorrhage. Presents with repeat hematochezia and acute anemia. EXAM: 1. Ultrasound-guided right common femoral vein  access 2. Selective catheterization of the superior mesenteric artery 3. Selective catheterization of the middle colic artery 4. Super selective catheterization of a distal branch of the middle colic artery 5. Mesenteric angiography MEDICATIONS: None ANESTHESIA/SEDATION: Moderate (conscious) sedation was employed during this procedure. A total of Versed 1 mg and Fentanyl 25 mcg was administered intravenously. Moderate Sedation Time: 40 minutes. The patient's level of consciousness and vital signs were monitored continuously by radiology nursing throughout the procedure under my direct supervision. CONTRAST:  32mL OMNIPAQUE IOHEXOL 300 MG/ML SOLN, 33mL OMNIPAQUE IOHEXOL 300 MG/ML SOLN, 51mL OMNIPAQUE IOHEXOL 300 MG/ML SOLN FLUOROSCOPY TIME:  Fluoroscopy Time: 8 minutes 30 seconds (510 mGy). COMPLICATIONS: None immediate. PROCEDURE: Informed consent was obtained from the patient following explanation of the procedure, risks, benefits and alternatives. The patient understands, agrees and consents for the procedure. All questions were addressed. A time out was performed prior to the initiation of the procedure. Maximal barrier sterile technique utilized including caps, mask, sterile gowns, sterile gloves, large sterile drape, hand hygiene, and Betadine prep. The right groin was prepped and draped in standard fashion. The inferior aspect of the femoral head was marked radiographically. Under direct ultrasound guidance, the distal common femoral vein was accessed with a micropuncture needle. The micropuncture sheath was placed and hand injection of contrast was performed in a is lateral oblique to delineate appropriate puncture site for closure device usage. A Bentson wire was directed to the abdominal aorta. A 5 French, levin cm sheath was placed in the right common femoral vein. Over the Bentson wire, a 5 French C2 catheter was directed to the superior mesenteric artery ostium. Superior mesenteric angiogram was  performed in the lateral projection. The superior mesenteric artery is widely patent with visualization of the middle colic artery arising anteriorly and coursing superiorly. Through the C2 catheter a J tipped direction microcatheter and single angle GT 0.018 inch wire were used to select the middle colic artery. Angiogram was performed from middle colic artery which demonstrated patency and no evidence of active extravasation. Angiogram was repeated in multiple obliquities to fully evaluate for possible hemorrhage however none was seen. A branch of the middle colic artery supplying the transverse colon was then selected with the microcatheter and wire. Repeat angiogram was performed from this location, still without apparent hemorrhage. The catheters were removed. A 6 French Angio-Seal device was used to close the right common femoral artery. The patient tolerated the procedure well without immediate complication. IMPRESSION: No evidence of active hemorrhage in the middle colic artery territory as was visualized on CTA the day prior. Latoya Cancer, MD [facility] Radiology Electronically Signed   By: Latoya Cancer MD   On: 04/27/2020 16:25   IR US Guide Vasc Access Right  Result Date: 04/27/2020 INDICATION: 84 year old with GI bleeding. CTA demonstrated focal hemorrhage in the distal transverse colon region. Patient was consented for mesenteric arteriogram and possible embolization. EXAM: MESENTERIC ARTERIOGRAM: SMA AND IMA ULTRASOUND GUIDANCE FOR VASCULAR ACCESS MEDICATIONS: Ciprofloxacin 400 mg. The antibiotic was administered within 1 hour of the procedure ANESTHESIA/SEDATION: Moderate (conscious) sedation was employed during this procedure. A total of Versed 1.0 mg and Fentanyl 25 mcg was administered intravenously. Moderate Sedation Time: 98 minutes. The patient's level of consciousness and vital signs were monitored continuously by radiology nursing  throughout the procedure under my direct supervision. CONTRAST:  200 mg Omnipaque 300 FLUOROSCOPY TIME:  Fluoroscopy Time: 25 minutes, 36 seconds, 938 COMPLICATIONS: None immediate. PROCEDURE: Informed consent was obtained from the patient following explanation of the procedure, risks, benefits and alternatives. The patient understands, agrees and consents for the procedure. All questions were addressed. A time out was performed prior to the initiation of the procedure. Maximal barrier sterile technique utilized including caps, mask, sterile gowns, sterile gloves, large sterile drape, hand hygiene, and Betadine prep. Fluoroscopy demonstrated that the urinary bladder was massively distended with contrast. Therefore, Foley catheter was performed prior to the procedure. Ciprofloxacin was given due to the Foley catheter placement. Ultrasound demonstrated a patent right common femoral artery. Ultrasound image was saved for documentation. Right groin was prepped and draped  in sterile fashion. Skin was anesthetized using 1% lidocaine. Small incision was made. Using ultrasound guidance, 21 gauge needle was directed into the right common femoral artery and a micropuncture dilator set was placed. Five French vascular sheath was placed. A Sos catheter was used to cannulate the SMA. SMA arteriograms were performed. Multiple attempts were made to cannulate the middle colic artery using a microcatheter but this was unsuccessful. Attention was directed to the IMA. It was difficult to cannulate the IMA but it was eventually cannulated after the IMA was identified using a lateral projection and angiography. IMA injections were performed with attention to the left upper abdomen at the area of concern. No active bleeding was identified. The SMA was cannulated again using a RIM catheter. SMA arteriogram was performed. No active bleeding was identified. Again, attempted to cannulate the middle colic artery with a microcatheter but  unable to cannulate the vessel. Five French catheter was removed over a Bentson wire. Angiography was performed through the right groin sheath. Right groin sheath was removed using an ExoSeal closure device. Right groin hemostasis at the end of the procedure. Bandage placed over the puncture site. FINDINGS: SMA is widely patent. No evidence for active bleeding. Attention was directed to the left upper quadrant in the distal transverse colon region. Middle colic artery appears to come off anteriorly but this could never be successfully cannulated. IMA is patent. Attention was directed to the left colic artery distribution. No significant filling of the distal transverse colon from the IMA distribution. No evidence of active bleeding from the IMA. IMPRESSION: Angiography of the superior mesenteric artery and inferior mesenteric artery. No evidence for active bleeding. Electronically Signed   By: Markus Daft M.D.   On: 04/27/2020 08:02   CT Angio Abd/Pel w/ and/or w/o  Result Date: 04/26/2020 CLINICAL DATA:  84 year old female with history of diverticulosis and intestinal hemorrhage. EXAM: CTA ABDOMEN AND PELVIS WITHOUT AND WITH CONTRAST TECHNIQUE: Multidetector CT imaging of the abdomen and pelvis was performed using the standard protocol during bolus administration of intravenous contrast. Multiplanar reconstructed images and MIPs were obtained and reviewed to evaluate the vascular anatomy. CONTRAST:  133mL OMNIPAQUE IOHEXOL 350 MG/ML SOLN COMPARISON:  None. 02/14/2019 FINDINGS: VASCULAR Aorta: Normal caliber and patent throughout. Scattered fibrofatty and calcific atherosclerotic changes most prominent in the distal segment. Celiac: Patent without evidence of aneurysm, dissection, vasculitis or significant stenosis. SMA: Patent without evidence of aneurysm, dissection, vasculitis or significant stenosis. Renals: Single bilateral renal arteries are patent without evidence of aneurysm, dissection, vasculitis,  fibromuscular dysplasia or significant stenosis. IMA: Patent without evidence of aneurysm, dissection, vasculitis or significant stenosis. Inflow: Patent without evidence of aneurysm, dissection, vasculitis or significant stenosis. Proximal Outflow: Bilateral common femoral and visualized portions of the superficial and profunda femoral arteries are patent without evidence of aneurysm, dissection, vasculitis or significant stenosis. Veins: No obvious venous abnormality. Review of the MIP images confirms the above findings. NON-VASCULAR Lower chest: Bibasilar subsegmental atelectasis. Heart is normal in size. No pericardial effusion. Hepatobiliary: The liver is normal in size, contour, and attenuation. No focal lesion. The gallbladder is present with layering sludge versus vicarious contrast excretion. No pericholecystic fluid, gallbladder wall thickening, or cholelithiasis. No intra or extrahepatic biliary ductal dilation. Pancreas: Coarse calcification within the pancreatic head. No pancreatic ductal dilatation or surrounding inflammatory changes. Spleen: Normal in size without focal abnormality. Adrenals/Urinary Tract: Adrenal glands are unremarkable. Kidneys are normal, without renal calculi, focal lesion, or hydronephrosis. Bladder is unremarkable. Stomach/Bowel: Focus of active  extravasation the distal transverse colon. Stomach is within normal limits. Appendix appears normal. Scattered colonic diverticula. No evidence of bowel wall thickening, distention, or inflammatory changes. Lymphatic: No enlarged abdominal or pelvic lymph nodes. Reproductive: Status post hysterectomy. No adnexal masses. Other: No abdominal wall hernia or abnormality. No abdominopelvic ascites. Musculoskeletal: Multilevel degenerative changes of the visualized thoracolumbar spine. No acute osseous abnormality. IMPRESSION: VASCULAR 1. Focal active hemorrhage within the distal transverse colon. 2.  Aortic Atherosclerosis (ICD10-I70.0).  NON-VASCULAR 1. Diverticulosis without evidence of diverticulitis. Recommend urgent Interventional Radiology consultation. These results were called by telephone at the time of interpretation on 04/26/2020 at 4:20 pm to provider Dr. Marylyn Ishihara, Who verbally acknowledged these results. Latoya Cancer, MD [facility] Radiology Electronically Signed   By: Latoya Cancer MD   On: 04/26/2020 16:25    Labs:  CBC: Recent Labs    07/28/19 1528 07/28/19 1528 04/26/20 1227 04/26/20 1227 04/26/20 2052 04/27/20 0111 04/27/20 1625 04/27/20 2059 04/28/20 0137 04/28/20 0725  WBC 5.3  --  4.6  --  4.3  --   --   --  5.1  --   HGB 13.9   < > 10.4*   < > 9.1*   < > 8.5* 7.8* 7.9* 8.1*  HCT 41.2   < > 29.5*   < > 26.8*   < > 24.7* 22.4* 23.1* 23.6*  PLT 201.0  --  212  --  213  --   --   --  190  --    < > = values in this interval not displayed.    COAGS: Recent Labs    04/26/20 1643  INR 1.1    BMP: Recent Labs    04/26/20 1227 04/27/20 0107 04/28/20 0137  NA 139 139 139  K 3.3* 3.0* 3.1*  CL 102 107 109  CO2 28 25 23   GLUCOSE 106* 107* 79  BUN 13 10 11   CALCIUM 9.9 9.0 9.2  CREATININE 0.64 0.54 0.59  GFRNONAA >60 >60 >60    LIVER FUNCTION TESTS: Recent Labs    04/26/20 1227 04/27/20 0107  BILITOT 0.5 0.4  AST 28 16  ALT 14 12  ALKPHOS 73 86  PROT 6.9 5.1*  ALBUMIN 4.0 3.0*    Assessment and Plan: Pt with hx HTN ,GERD, diverticular disease, persistent GI bleeding, recent CTA abd/pelvis with focal hemorrhage in distal TV colon region; s/p visceral angio 10/21 but no active bleeding noted; repeat angio on 10/22 again showed no active bleeding; afebrile; hgb 8.1(7.9)- no transfusion ;  K 3.1- replace; creat nl; cont to monitor labs; additional plans as per primary team/GI  Electronically Signed: D. Rowe Robert, PA-C 04/28/2020, 10:12 AM   I spent a total of 15 minutes at the the patient's bedside AND on the patient's hospital  floor or unit, greater than 50% of which was counseling/coordinating care for visceral arteriogram    [name], female   DOB: 1935-09-06, 84 y.o.   MRN: [id number]

## 2020-04-28 NOTE — Progress Notes (Addendum)
[facility] Progress Note  CC: Painless hematochezia  Subjective: Nausea or vomiting. No abdominal pain. No bowel movement or hematochezia since yesterday. However, she feels she may have a bowel movement soon. No family at the bedside.  Objective:  Vital signs in last 24 hours: Temp:  [98.2 F (36.8 C)-99.3 F (37.4 C)] 98.5 F (36.9 C) (10/23 0615) Pulse Rate:  [76-87] 77 (10/23 0615) Resp:  [12-26] 18 (10/23 0615) BP: (107-143)/(50-67) 122/58 (10/23 0615) SpO2:  [97 %-100 %] 100 % (10/23 0615) Last BM Date: 04/27/20   General:   Alert,  Well-developed, in NAD Heart: Regular rate and rhythm, no murmurs. Pulm: Breath sounds clear throughout. Abdomen: Abdomen is soft, nontender. Nondistended. Extremities:  Without edema. Neurologic:  Alert and  oriented x4;  grossly normal neurologically. Psych:  Alert and cooperative. Normal mood and affect.  Intake/Output from previous day: 10/22 0701 - 10/23 0700 In: 237 [P.O.:237] Out: -  Intake/Output this shift: No intake/output data recorded.  Lab Results: Recent Labs    04/26/20 1227 04/26/20 1227 04/26/20 2052 04/27/20 0111 04/27/20 2059 04/28/20 0137 04/28/20 0725  WBC 4.6  --  4.3  --   --  5.1  --   HGB 10.4*   < > 9.1*   < > 7.8* 7.9* 8.1*  HCT 29.5*   < > 26.8*   < > 22.4* 23.1* 23.6*  PLT 212  --  213  --   --  190  --    < > = values in this interval not displayed.   BMET Recent Labs    04/26/20 1227 04/27/20 0107 04/28/20 0137  NA 139 139 139  K 3.3* 3.0* 3.1*  CL 102 107 109  CO2 28 25 23   GLUCOSE 106* 107* 79  BUN 13 10 11   CREATININE 0.64 0.54 0.59  CALCIUM 9.9 9.0 9.2   LFT Recent Labs    04/27/20 0107  PROT 5.1*  ALBUMIN 3.0*  AST 16  ALT 12  ALKPHOS 57  BILITOT 0.4   PT/INR Recent Labs    04/26/20 1643  LABPROT 13.4  INR 1.1   Hepatitis Panel No results for input(s): HEPBSAG, HCVAB, HEPAIGM, HEPBIGM in the last 72 hours.  IR Angiogram Visceral  Selective  Result Date: 04/27/2020 INDICATION: 84 year old female with acute lower gastrointestinal hemorrhage visualized on CT a the day prior from the transverse colon. Prior mesenteric angiogram without evidence of active hemorrhage. Presents with repeat hematochezia and acute anemia. EXAM: 1. Ultrasound-guided right common femoral vein access 2. Selective catheterization of the superior mesenteric artery 3. Selective catheterization of the middle colic artery 4. Super selective catheterization of a distal branch of the middle colic artery 5. Mesenteric angiography MEDICATIONS: None ANESTHESIA/SEDATION: Moderate (conscious) sedation was employed during this procedure. A total of Versed 1 mg and Fentanyl 25 mcg was administered intravenously. Moderate Sedation Time: 40 minutes. The patient's level of consciousness and vital signs were monitored continuously by radiology nursing throughout the procedure under my direct supervision. CONTRAST:  75mL OMNIPAQUE IOHEXOL 300 MG/ML SOLN, 49mL OMNIPAQUE IOHEXOL 300 MG/ML SOLN, 49mL OMNIPAQUE IOHEXOL 300 MG/ML SOLN FLUOROSCOPY TIME:  Fluoroscopy Time: 8 minutes 30 seconds (510 mGy). COMPLICATIONS: None immediate. PROCEDURE: Informed consent was obtained from the patient following explanation of the procedure, risks, benefits and alternatives. The patient understands, agrees and consents for the procedure. All questions were addressed. A time out was performed prior to the initiation of the procedure. Maximal barrier sterile technique utilized including caps,  mask, sterile gowns, sterile gloves, large sterile drape, hand hygiene, and Betadine prep. The right groin was prepped and draped in standard fashion. The inferior aspect of the femoral head was marked radiographically. Under direct ultrasound guidance, the distal common femoral vein was accessed with a micropuncture needle. The micropuncture sheath was placed and hand injection of contrast was performed in a is  lateral oblique to delineate appropriate puncture site for closure device usage. A Bentson wire was directed to the abdominal aorta. A 5 French, levin cm sheath was placed in the right common femoral vein. Over the Bentson wire, a 5 French C2 catheter was directed to the superior mesenteric artery ostium. Superior mesenteric angiogram was performed in the lateral projection. The superior mesenteric artery is widely patent with visualization of the middle colic artery arising anteriorly and coursing superiorly. Through the C2 catheter a J tipped direction microcatheter and single angle GT 0.018 inch wire were used to select the middle colic artery. Angiogram was performed from middle colic artery which demonstrated patency and no evidence of active extravasation. Angiogram was repeated in multiple obliquities to fully evaluate for possible hemorrhage however none was seen. A branch of the middle colic artery supplying the transverse colon was then selected with the microcatheter and wire. Repeat angiogram was performed from this location, still without apparent hemorrhage. The catheters were removed. A 6 French Angio-Seal device was used to close the right common femoral artery. The patient tolerated the procedure well without immediate complication. IMPRESSION: No evidence of active hemorrhage in the middle colic artery territory as was visualized on CTA the day prior. [name], MD [facility] Radiology Electronically Signed   By: [name] MD   On: 04/27/2020 16:25   IR Angiogram Visceral Selective  Result Date: 04/27/2020 INDICATION: 84 year old with GI bleeding. CTA demonstrated focal hemorrhage in the distal transverse colon region. Patient was consented for mesenteric arteriogram and possible embolization. EXAM: MESENTERIC ARTERIOGRAM: SMA AND IMA ULTRASOUND GUIDANCE FOR VASCULAR ACCESS MEDICATIONS: Ciprofloxacin 400 mg. The antibiotic was  administered within 1 hour of the procedure ANESTHESIA/SEDATION: Moderate (conscious) sedation was employed during this procedure. A total of Versed 1.0 mg and Fentanyl 25 mcg was administered intravenously. Moderate Sedation Time: 98 minutes. The patient's level of consciousness and vital signs were monitored continuously by radiology nursing throughout the procedure under my direct supervision. CONTRAST:  200 mg Omnipaque 300 FLUOROSCOPY TIME:  Fluoroscopy Time: 25 minutes, 36 seconds, 546 COMPLICATIONS: None immediate. PROCEDURE: Informed consent was obtained from the patient following explanation of the procedure, risks, benefits and alternatives. The patient understands, agrees and consents for the procedure. All questions were addressed. A time out was performed prior to the initiation of the procedure. Maximal barrier sterile technique utilized including caps, mask, sterile gowns, sterile gloves, large sterile drape, hand hygiene, and Betadine prep. Fluoroscopy demonstrated that the urinary bladder was massively distended with contrast. Therefore, Foley catheter was performed prior to the procedure. Ciprofloxacin was given due to the Foley catheter placement. Ultrasound demonstrated a patent right common femoral artery. Ultrasound image was saved for documentation. Right groin was prepped and draped in sterile fashion. Skin was anesthetized using 1% lidocaine. Small incision was made. Using ultrasound guidance, 21 gauge needle was directed into the right common femoral artery and a micropuncture dilator set was placed. Five French vascular sheath was placed. A Sos catheter was used to cannulate the SMA. SMA arteriograms were performed. Multiple attempts were made to cannulate the middle colic  artery using a microcatheter but this was unsuccessful. Attention was directed to the IMA. It was difficult to cannulate the IMA but it was eventually cannulated after the IMA was identified using a lateral projection  and angiography. IMA injections were performed with attention to the left upper abdomen at the area of concern. No active bleeding was identified. The SMA was cannulated again using a RIM catheter. SMA arteriogram was performed. No active bleeding was identified. Again, attempted to cannulate the middle colic artery with a microcatheter but unable to cannulate the vessel. Five French catheter was removed over a Bentson wire. Angiography was performed through the right groin sheath. Right groin sheath was removed using an ExoSeal closure device. Right groin hemostasis at the end of the procedure. Bandage placed over the puncture site. FINDINGS: SMA is widely patent. No evidence for active bleeding. Attention was directed to the left upper quadrant in the distal transverse colon region. Middle colic artery appears to come off anteriorly but this could never be successfully cannulated. IMA is patent. Attention was directed to the left colic artery distribution. No significant filling of the distal transverse colon from the IMA distribution. No evidence of active bleeding from the IMA. IMPRESSION: Angiography of the superior mesenteric artery and inferior mesenteric artery. No evidence for active bleeding. Electronically Signed   By: Markus Daft M.D.   On: 04/27/2020 08:02   IR Angiogram Visceral Selective  Result Date: 04/27/2020 INDICATION: 84 year old with GI bleeding. CTA demonstrated focal hemorrhage in the distal transverse colon region. Patient was consented for mesenteric arteriogram and possible embolization. EXAM: MESENTERIC ARTERIOGRAM: SMA AND IMA ULTRASOUND GUIDANCE FOR VASCULAR ACCESS MEDICATIONS: Ciprofloxacin 400 mg. The antibiotic was administered within 1 hour of the procedure ANESTHESIA/SEDATION: Moderate (conscious) sedation was employed during this procedure. A total of Versed 1.0 mg and Fentanyl 25 mcg was administered intravenously. Moderate Sedation Time: 98 minutes. The patient's level of  consciousness and vital signs were monitored continuously by radiology nursing throughout the procedure under my direct supervision. CONTRAST:  200 mg Omnipaque 300 FLUOROSCOPY TIME:  Fluoroscopy Time: 25 minutes, 36 seconds, 283 COMPLICATIONS: None immediate. PROCEDURE: Informed consent was obtained from the patient following explanation of the procedure, risks, benefits and alternatives. The patient understands, agrees and consents for the procedure. All questions were addressed. A time out was performed prior to the initiation of the procedure. Maximal barrier sterile technique utilized including caps, mask, sterile gowns, sterile gloves, large sterile drape, hand hygiene, and Betadine prep. Fluoroscopy demonstrated that the urinary bladder was massively distended with contrast. Therefore, Foley catheter was performed prior to the procedure. Ciprofloxacin was given due to the Foley catheter placement. Ultrasound demonstrated a patent right common femoral artery. Ultrasound image was saved for documentation. Right groin was prepped and draped in sterile fashion. Skin was anesthetized using 1% lidocaine. Small incision was made. Using ultrasound guidance, 21 gauge needle was directed into the right common femoral artery and a micropuncture dilator set was placed. Five French vascular sheath was placed. A Sos catheter was used to cannulate the SMA. SMA arteriograms were performed. Multiple attempts were made to cannulate the middle colic artery using a microcatheter but this was unsuccessful. Attention was directed to the IMA. It was difficult to cannulate the IMA but it was eventually cannulated after the IMA was identified using a lateral projection and angiography. IMA injections were performed with attention to the left upper abdomen at the area of concern. No active bleeding was identified. The SMA was cannulated again  using a RIM catheter. SMA arteriogram was performed. No active bleeding was identified.  Again, attempted to cannulate the middle colic artery with a microcatheter but unable to cannulate the vessel. Five French catheter was removed over a Bentson wire. Angiography was performed through the right groin sheath. Right groin sheath was removed using an ExoSeal closure device. Right groin hemostasis at the end of the procedure. Bandage placed over the puncture site. FINDINGS: SMA is widely patent. No evidence for active bleeding. Attention was directed to the left upper quadrant in the distal transverse colon region. Middle colic artery appears to come off anteriorly but this could never be successfully cannulated. IMA is patent. Attention was directed to the left colic artery distribution. No significant filling of the distal transverse colon from the IMA distribution. No evidence of active bleeding from the IMA. IMPRESSION: Angiography of the superior mesenteric artery and inferior mesenteric artery. No evidence for active bleeding. Electronically Signed   By: Markus Daft M.D.   On: 04/27/2020 08:02   IR Angiogram Selective Each Additional Vessel  Result Date: 04/27/2020 INDICATION: 84 year old female with acute lower gastrointestinal hemorrhage visualized on CT a the day prior from the transverse colon. Prior mesenteric angiogram without evidence of active hemorrhage. Presents with repeat hematochezia and acute anemia. EXAM: 1. Ultrasound-guided right common femoral vein access 2. Selective catheterization of the superior mesenteric artery 3. Selective catheterization of the middle colic artery 4. Super selective catheterization of a distal branch of the middle colic artery 5. Mesenteric angiography MEDICATIONS: None ANESTHESIA/SEDATION: Moderate (conscious) sedation was employed during this procedure. A total of Versed 1 mg and Fentanyl 25 mcg was administered intravenously. Moderate Sedation Time: 40 minutes. The patient's level of consciousness and vital signs were monitored continuously by  radiology nursing throughout the procedure under my direct supervision. CONTRAST:  49mL OMNIPAQUE IOHEXOL 300 MG/ML SOLN, 38mL OMNIPAQUE IOHEXOL 300 MG/ML SOLN, 63mL OMNIPAQUE IOHEXOL 300 MG/ML SOLN FLUOROSCOPY TIME:  Fluoroscopy Time: 8 minutes 30 seconds (510 mGy). COMPLICATIONS: None immediate. PROCEDURE: Informed consent was obtained from the patient following explanation of the procedure, risks, benefits and alternatives. The patient understands, agrees and consents for the procedure. All questions were addressed. A time out was performed prior to the initiation of the procedure. Maximal barrier sterile technique utilized including caps, mask, sterile gowns, sterile gloves, large sterile drape, hand hygiene, and Betadine prep. The right groin was prepped and draped in standard fashion. The inferior aspect of the femoral head was marked radiographically. Under direct ultrasound guidance, the distal common femoral vein was accessed with a micropuncture needle. The micropuncture sheath was placed and hand injection of contrast was performed in a is lateral oblique to delineate appropriate puncture site for closure device usage. A Bentson wire was directed to the abdominal aorta. A 5 French, levin cm sheath was placed in the right common femoral vein. Over the Bentson wire, a 5 French C2 catheter was directed to the superior mesenteric artery ostium. Superior mesenteric angiogram was performed in the lateral projection. The superior mesenteric artery is widely patent with visualization of the middle colic artery arising anteriorly and coursing superiorly. Through the C2 catheter a J tipped direction microcatheter and single angle GT 0.018 inch wire were used to select the middle colic artery. Angiogram was performed from middle colic artery which demonstrated patency and no evidence of active extravasation. Angiogram was repeated in multiple obliquities to fully evaluate for possible hemorrhage however none was  seen. A branch of the middle colic artery  supplying the transverse colon was then selected with the microcatheter and wire. Repeat angiogram was performed from this location, still without apparent hemorrhage. The catheters were removed. A 6 French Angio-Seal device was used to close the right common femoral artery. The patient tolerated the procedure well without immediate complication. IMPRESSION: No evidence of active hemorrhage in the middle colic artery territory as was visualized on CTA the day prior. [name], MD [facility] Radiology Electronically Signed   By: [name] MD   On: 04/27/2020 16:25   IR Angiogram Selective Each Additional Vessel  Result Date: 04/27/2020 INDICATION: 84 year old female with acute lower gastrointestinal hemorrhage visualized on CT a the day prior from the transverse colon. Prior mesenteric angiogram without evidence of active hemorrhage. Presents with repeat hematochezia and acute anemia. EXAM: 1. Ultrasound-guided right common femoral vein access 2. Selective catheterization of the superior mesenteric artery 3. Selective catheterization of the middle colic artery 4. Super selective catheterization of a distal branch of the middle colic artery 5. Mesenteric angiography MEDICATIONS: None ANESTHESIA/SEDATION: Moderate (conscious) sedation was employed during this procedure. A total of Versed 1 mg and Fentanyl 25 mcg was administered intravenously. Moderate Sedation Time: 40 minutes. The patient's level of consciousness and vital signs were monitored continuously by radiology nursing throughout the procedure under my direct supervision. CONTRAST:  66mL OMNIPAQUE IOHEXOL 300 MG/ML SOLN, 45mL OMNIPAQUE IOHEXOL 300 MG/ML SOLN, 32mL OMNIPAQUE IOHEXOL 300 MG/ML SOLN FLUOROSCOPY TIME:  Fluoroscopy Time: 8 minutes 30 seconds (510 mGy). COMPLICATIONS: None immediate. PROCEDURE: Informed consent was obtained from the patient  following explanation of the procedure, risks, benefits and alternatives. The patient understands, agrees and consents for the procedure. All questions were addressed. A time out was performed prior to the initiation of the procedure. Maximal barrier sterile technique utilized including caps, mask, sterile gowns, sterile gloves, large sterile drape, hand hygiene, and Betadine prep. The right groin was prepped and draped in standard fashion. The inferior aspect of the femoral head was marked radiographically. Under direct ultrasound guidance, the distal common femoral vein was accessed with a micropuncture needle. The micropuncture sheath was placed and hand injection of contrast was performed in a is lateral oblique to delineate appropriate puncture site for closure device usage. A Bentson wire was directed to the abdominal aorta. A 5 French, levin cm sheath was placed in the right common femoral vein. Over the Bentson wire, a 5 French C2 catheter was directed to the superior mesenteric artery ostium. Superior mesenteric angiogram was performed in the lateral projection. The superior mesenteric artery is widely patent with visualization of the middle colic artery arising anteriorly and coursing superiorly. Through the C2 catheter a J tipped direction microcatheter and single angle GT 0.018 inch wire were used to select the middle colic artery. Angiogram was performed from middle colic artery which demonstrated patency and no evidence of active extravasation. Angiogram was repeated in multiple obliquities to fully evaluate for possible hemorrhage however none was seen. A branch of the middle colic artery supplying the transverse colon was then selected with the microcatheter and wire. Repeat angiogram was performed from this location, still without apparent hemorrhage. The catheters were removed. A 6 French Angio-Seal device was used to close the right common femoral artery. The patient tolerated the procedure well  without immediate complication. IMPRESSION: No evidence of active hemorrhage in the middle colic artery territory as was visualized on CTA the day prior. [name], MD [facility] Radiology  Electronically Signed   By: [name] MD   On: 04/27/2020 16:25   IR Angiogram Selective Each Additional Vessel  Result Date: 04/27/2020 INDICATION: 84 year old female with acute lower gastrointestinal hemorrhage visualized on CT a the day prior from the transverse colon. Prior mesenteric angiogram without evidence of active hemorrhage. Presents with repeat hematochezia and acute anemia. EXAM: 1. Ultrasound-guided right common femoral vein access 2. Selective catheterization of the superior mesenteric artery 3. Selective catheterization of the middle colic artery 4. Super selective catheterization of a distal branch of the middle colic artery 5. Mesenteric angiography MEDICATIONS: None ANESTHESIA/SEDATION: Moderate (conscious) sedation was employed during this procedure. A total of Versed 1 mg and Fentanyl 25 mcg was administered intravenously. Moderate Sedation Time: 40 minutes. The patient's level of consciousness and vital signs were monitored continuously by radiology nursing throughout the procedure under my direct supervision. CONTRAST:  2mL OMNIPAQUE IOHEXOL 300 MG/ML SOLN, 36mL OMNIPAQUE IOHEXOL 300 MG/ML SOLN, 70mL OMNIPAQUE IOHEXOL 300 MG/ML SOLN FLUOROSCOPY TIME:  Fluoroscopy Time: 8 minutes 30 seconds (510 mGy). COMPLICATIONS: None immediate. PROCEDURE: Informed consent was obtained from the patient following explanation of the procedure, risks, benefits and alternatives. The patient understands, agrees and consents for the procedure. All questions were addressed. A time out was performed prior to the initiation of the procedure. Maximal barrier sterile technique utilized including caps, mask, sterile gowns, sterile gloves, large sterile drape, hand  hygiene, and Betadine prep. The right groin was prepped and draped in standard fashion. The inferior aspect of the femoral head was marked radiographically. Under direct ultrasound guidance, the distal common femoral vein was accessed with a micropuncture needle. The micropuncture sheath was placed and hand injection of contrast was performed in a is lateral oblique to delineate appropriate puncture site for closure device usage. A Bentson wire was directed to the abdominal aorta. A 5 French, levin cm sheath was placed in the right common femoral vein. Over the Bentson wire, a 5 French C2 catheter was directed to the superior mesenteric artery ostium. Superior mesenteric angiogram was performed in the lateral projection. The superior mesenteric artery is widely patent with visualization of the middle colic artery arising anteriorly and coursing superiorly. Through the C2 catheter a J tipped direction microcatheter and single angle GT 0.018 inch wire were used to select the middle colic artery. Angiogram was performed from middle colic artery which demonstrated patency and no evidence of active extravasation. Angiogram was repeated in multiple obliquities to fully evaluate for possible hemorrhage however none was seen. A branch of the middle colic artery supplying the transverse colon was then selected with the microcatheter and wire. Repeat angiogram was performed from this location, still without apparent hemorrhage. The catheters were removed. A 6 French Angio-Seal device was used to close the right common femoral artery. The patient tolerated the procedure well without immediate complication. IMPRESSION: No evidence of active hemorrhage in the middle colic artery territory as was visualized on CTA the day prior. [name], MD [facility] Radiology Electronically Signed   By: [name] MD   On: 04/27/2020 16:25   IR US Guide Vasc Access Right  Result  Date: 04/27/2020 INDICATION: 84 year old female with acute lower gastrointestinal hemorrhage visualized on CT a the day prior from the transverse colon. Prior mesenteric angiogram without evidence of active hemorrhage. Presents with repeat hematochezia and acute anemia. EXAM: 1. Ultrasound-guided right common femoral vein access 2. Selective catheterization of the superior mesenteric artery 3. Selective catheterization of  the middle colic artery 4. Super selective catheterization of a distal branch of the middle colic artery 5. Mesenteric angiography MEDICATIONS: None ANESTHESIA/SEDATION: Moderate (conscious) sedation was employed during this procedure. A total of Versed 1 mg and Fentanyl 25 mcg was administered intravenously. Moderate Sedation Time: 40 minutes. The patient's level of consciousness and vital signs were monitored continuously by radiology nursing throughout the procedure under my direct supervision. CONTRAST:  67mL OMNIPAQUE IOHEXOL 300 MG/ML SOLN, 77mL OMNIPAQUE IOHEXOL 300 MG/ML SOLN, 81mL OMNIPAQUE IOHEXOL 300 MG/ML SOLN FLUOROSCOPY TIME:  Fluoroscopy Time: 8 minutes 30 seconds (510 mGy). COMPLICATIONS: None immediate. PROCEDURE: Informed consent was obtained from the patient following explanation of the procedure, risks, benefits and alternatives. The patient understands, agrees and consents for the procedure. All questions were addressed. A time out was performed prior to the initiation of the procedure. Maximal barrier sterile technique utilized including caps, mask, sterile gowns, sterile gloves, large sterile drape, hand hygiene, and Betadine prep. The right groin was prepped and draped in standard fashion. The inferior aspect of the femoral head was marked radiographically. Under direct ultrasound guidance, the distal common femoral vein was accessed with a micropuncture needle. The micropuncture sheath was placed and hand injection of contrast was performed in a is lateral oblique to  delineate appropriate puncture site for closure device usage. A Bentson wire was directed to the abdominal aorta. A 5 French, levin cm sheath was placed in the right common femoral vein. Over the Bentson wire, a 5 French C2 catheter was directed to the superior mesenteric artery ostium. Superior mesenteric angiogram was performed in the lateral projection. The superior mesenteric artery is widely patent with visualization of the middle colic artery arising anteriorly and coursing superiorly. Through the C2 catheter a J tipped direction microcatheter and single angle GT 0.018 inch wire were used to select the middle colic artery. Angiogram was performed from middle colic artery which demonstrated patency and no evidence of active extravasation. Angiogram was repeated in multiple obliquities to fully evaluate for possible hemorrhage however none was seen. A branch of the middle colic artery supplying the transverse colon was then selected with the microcatheter and wire. Repeat angiogram was performed from this location, still without apparent hemorrhage. The catheters were removed. A 6 French Angio-Seal device was used to close the right common femoral artery. The patient tolerated the procedure well without immediate complication. IMPRESSION: No evidence of active hemorrhage in the middle colic artery territory as was visualized on CTA the day prior. [name], MD [facility] Electronically Signed   By: [name] MD   On: 04/27/2020 16:25   IR US Guide Vasc Access Right  Result Date: 04/27/2020 INDICATION: 84 year old with GI bleeding. CTA demonstrated focal hemorrhage in the distal transverse colon region. Patient was consented for mesenteric arteriogram and possible embolization. EXAM: MESENTERIC ARTERIOGRAM: SMA AND IMA ULTRASOUND GUIDANCE FOR VASCULAR ACCESS MEDICATIONS: Ciprofloxacin 400 mg. The antibiotic was administered within 1 hour of  the procedure ANESTHESIA/SEDATION: Moderate (conscious) sedation was employed during this procedure. A total of Versed 1.0 mg and Fentanyl 25 mcg was administered intravenously. Moderate Sedation Time: 98 minutes. The patient's level of consciousness and vital signs were monitored continuously by radiology nursing throughout the procedure under my direct supervision. CONTRAST:  200 mg Omnipaque 300 FLUOROSCOPY TIME:  Fluoroscopy Time: 25 minutes, 36 seconds, 762 COMPLICATIONS: None immediate. PROCEDURE: Informed consent was obtained from the patient following explanation of the procedure, risks, benefits and alternatives. The  patient understands, agrees and consents for the procedure. All questions were addressed. A time out was performed prior to the initiation of the procedure. Maximal barrier sterile technique utilized including caps, mask, sterile gowns, sterile gloves, large sterile drape, hand hygiene, and Betadine prep. Fluoroscopy demonstrated that the urinary bladder was massively distended with contrast. Therefore, Foley catheter was performed prior to the procedure. Ciprofloxacin was given due to the Foley catheter placement. Ultrasound demonstrated a patent right common femoral artery. Ultrasound image was saved for documentation. Right groin was prepped and draped in sterile fashion. Skin was anesthetized using 1% lidocaine. Small incision was made. Using ultrasound guidance, 21 gauge needle was directed into the right common femoral artery and a micropuncture dilator set was placed. Five French vascular sheath was placed. A Sos catheter was used to cannulate the SMA. SMA arteriograms were performed. Multiple attempts were made to cannulate the middle colic artery using a microcatheter but this was unsuccessful. Attention was directed to the IMA. It was difficult to cannulate the IMA but it was eventually cannulated after the IMA was identified using a lateral projection and angiography. IMA injections  were performed with attention to the left upper abdomen at the area of concern. No active bleeding was identified. The SMA was cannulated again using a RIM catheter. SMA arteriogram was performed. No active bleeding was identified. Again, attempted to cannulate the middle colic artery with a microcatheter but unable to cannulate the vessel. Five French catheter was removed over a Bentson wire. Angiography was performed through the right groin sheath. Right groin sheath was removed using an ExoSeal closure device. Right groin hemostasis at the end of the procedure. Bandage placed over the puncture site. FINDINGS: SMA is widely patent. No evidence for active bleeding. Attention was directed to the left upper quadrant in the distal transverse colon region. Middle colic artery appears to come off anteriorly but this could never be successfully cannulated. IMA is patent. Attention was directed to the left colic artery distribution. No significant filling of the distal transverse colon from the IMA distribution. No evidence of active bleeding from the IMA. IMPRESSION: Angiography of the superior mesenteric artery and inferior mesenteric artery. No evidence for active bleeding. Electronically Signed   By: Markus Daft M.D.   On: 04/27/2020 08:02   CT Angio Abd/Pel w/ and/or w/o  Result Date: 04/26/2020 CLINICAL DATA:  84 year old female with history of diverticulosis and intestinal hemorrhage. EXAM: CTA ABDOMEN AND PELVIS WITHOUT AND WITH CONTRAST TECHNIQUE: Multidetector CT imaging of the abdomen and pelvis was performed using the standard protocol during bolus administration of intravenous contrast. Multiplanar reconstructed images and MIPs were obtained and reviewed to evaluate the vascular anatomy. CONTRAST:  162mL OMNIPAQUE IOHEXOL 350 MG/ML SOLN COMPARISON:  None. 02/14/2019 FINDINGS: VASCULAR Aorta: Normal caliber and patent throughout. Scattered fibrofatty and calcific atherosclerotic changes most prominent in  the distal segment. Celiac: Patent without evidence of aneurysm, dissection, vasculitis or significant stenosis. SMA: Patent without evidence of aneurysm, dissection, vasculitis or significant stenosis. Renals: Single bilateral renal arteries are patent without evidence of aneurysm, dissection, vasculitis, fibromuscular dysplasia or significant stenosis. IMA: Patent without evidence of aneurysm, dissection, vasculitis or significant stenosis. Inflow: Patent without evidence of aneurysm, dissection, vasculitis or significant stenosis. Proximal Outflow: Bilateral common femoral and visualized portions of the superficial and profunda femoral arteries are patent without evidence of aneurysm, dissection, vasculitis or significant stenosis. Veins: No obvious venous abnormality. Review of the MIP images confirms the above findings. NON-VASCULAR Lower chest: Bibasilar subsegmental  atelectasis. Heart is normal in size. No pericardial effusion. Hepatobiliary: The liver is normal in size, contour, and attenuation. No focal lesion. The gallbladder is present with layering sludge versus vicarious contrast excretion. No pericholecystic fluid, gallbladder wall thickening, or cholelithiasis. No intra or extrahepatic biliary ductal dilation. Pancreas: Coarse calcification within the pancreatic head. No pancreatic ductal dilatation or surrounding inflammatory changes. Spleen: Normal in size without focal abnormality. Adrenals/Urinary Tract: Adrenal glands are unremarkable. Kidneys are normal, without renal calculi, focal lesion, or hydronephrosis. Bladder is unremarkable. Stomach/Bowel: Focus of active extravasation the distal transverse colon. Stomach is within normal limits. Appendix appears normal. Scattered colonic diverticula. No evidence of bowel wall thickening, distention, or inflammatory changes. Lymphatic: No enlarged abdominal or pelvic lymph nodes. Reproductive: Status post hysterectomy. No adnexal masses. Other: No  abdominal wall hernia or abnormality. No abdominopelvic ascites. Musculoskeletal: Multilevel degenerative changes of the visualized thoracolumbar spine. No acute osseous abnormality. IMPRESSION: VASCULAR 1. Focal active hemorrhage within the distal transverse colon. 2.  Aortic Atherosclerosis (ICD10-I70.0). NON-VASCULAR 1. Diverticulosis without evidence of diverticulitis. Recommend urgent Interventional Radiology consultation. These results were called by telephone at the time of interpretation on 04/26/2020 at 4:20 pm to provider Dr. Marylyn Ishihara, Who verbally acknowledged these results. [name], MD [facility] Electronically Signed   By: [name] MD   On: 04/26/2020 16:25    Assessment / Plan:  61. 84 year old female with history of recurrent diverticular bleed presents with recurrence of painless hematochezia and questionable black solid stool with anemia. Hemoglobin 10.4 (base line Hg 13.9 on 07/28/2019). Abdominal/pelvic CT angiogram showed active hemorrhage to the distal transverse colon.  S/P mesenteric arteriogram by IR 10/21 did not identify active bleeding, unable to cannulate the middle colic artery.  Passed a moderate amount darker blood in the bedpan yesterday afternoon.  A repeat is enteric angiogram 10/22 resulted in successful cannulation of the middle colic artery, angiogram without evidence of active hemorrhage for no further intervention was performed.  A unit of packed red blood cells was ordered earlier yesterday but was not transfused.  Her hemoglobin has remained stable overnight Hg 7.8 -> 7.9 ->8.1.  -Continue H/H Q 6hrs for now -Transfuse for Hg < 7.5 - 8 -Monitor patient closely for GI bleeding, nursing staff to call our service if she demonstrates further hematochezia -Continue full liquid diet this morning, if no further hematochezia and H&H stable we will try to advance her diet later today  2.History of colon  polyps per colonoscopy 02/2019 which were not removed in the setting of active GI bleeding -Eventual colonoscopy   3. Hypokalemia. K+ 3.1. KCl replacement per the hospitalist    Active Problems:   GIB (gastrointestinal bleeding)     LOS: 2 days   Noralyn Pick  04/28/2020, 9:10 AM  I have discussed the case with the PA, and that is the plan I formulated. I personally interviewed and examined the patient.  2nd angiogram yesterday also did not show active bleeding even after being able to access the middle colic artery.  Fortunately, no overt rebleeding in over 24 hours and hemoglobin is remained stable since then.  She is on a full liquid diet and somewhat reluctant to advance her diet.  I have reassured her that if bleeding is going to happen, it will do so regardless of whether or not she is eating.  Regular diet with breakfast, close observation, hemoglobin in the morning.   25 minutes were spent on this encounter (  including chart review, history/exam, counseling/coordination of care, and documentation)   Nelida Meuse III Office: [phone]

## 2020-04-29 DIAGNOSIS — D62 Acute posthemorrhagic anemia: Secondary | ICD-10-CM | POA: Diagnosis not present

## 2020-04-29 DIAGNOSIS — K5731 Diverticulosis of large intestine without perforation or abscess with bleeding: Secondary | ICD-10-CM | POA: Diagnosis not present

## 2020-04-29 DIAGNOSIS — K922 Gastrointestinal hemorrhage, unspecified: Secondary | ICD-10-CM | POA: Diagnosis not present

## 2020-04-29 LAB — CBC WITH DIFFERENTIAL/PLATELET
Abs Immature Granulocytes: 0.02 10*3/uL (ref 0.00–0.07)
Basophils Absolute: 0 10*3/uL (ref 0.0–0.1)
Basophils Relative: 0 %
Eosinophils Absolute: 0.1 10*3/uL (ref 0.0–0.5)
Eosinophils Relative: 2 %
HCT: 23.9 % — ABNORMAL LOW (ref 36.0–46.0)
Hemoglobin: 8.1 g/dL — ABNORMAL LOW (ref 12.0–15.0)
Immature Granulocytes: 0 %
Lymphocytes Relative: 28 %
Lymphs Abs: 1.3 10*3/uL (ref 0.7–4.0)
MCH: 26.3 pg (ref 26.0–34.0)
MCHC: 33.9 g/dL (ref 30.0–36.0)
MCV: 77.6 fL — ABNORMAL LOW (ref 80.0–100.0)
Monocytes Absolute: 0.4 10*3/uL (ref 0.1–1.0)
Monocytes Relative: 9 %
Neutro Abs: 2.8 10*3/uL (ref 1.7–7.7)
Neutrophils Relative %: 61 %
Platelets: 184 10*3/uL (ref 150–400)
RBC: 3.08 MIL/uL — ABNORMAL LOW (ref 3.87–5.11)
RDW: 15.4 % (ref 11.5–15.5)
WBC: 4.7 10*3/uL (ref 4.0–10.5)
nRBC: 0 % (ref 0.0–0.2)

## 2020-04-29 LAB — BASIC METABOLIC PANEL
Anion gap: 6 (ref 5–15)
BUN: 11 mg/dL (ref 8–23)
CO2: 27 mmol/L (ref 22–32)
Calcium: 9.6 mg/dL (ref 8.9–10.3)
Chloride: 106 mmol/L (ref 98–111)
Creatinine, Ser: 0.55 mg/dL (ref 0.44–1.00)
GFR, Estimated: >60 mL/min (ref >60)
Glucose, Bld: 116 mg/dL — ABNORMAL HIGH (ref 70–99)
Potassium: 3.5 mmol/L (ref 3.5–5.1)
Sodium: 139 mmol/L (ref 135–145)

## 2020-04-29 MED ORDER — SODIUM CHLORIDE 0.9 % IV SOLN
125.0000 mg | Freq: Once | INTRAVENOUS | Status: AC
  Administered 2020-04-29: 125 mg via INTRAVENOUS
  Filled 2020-04-29: qty 10

## 2020-04-29 NOTE — Progress Notes (Addendum)
[facility] Progress Note  CC:   Painless hematochezia  Subjective: She feels well this morning. She ate a regular diet last evening. No abdominal pain. No N/V. No CP or SOB. No BM or hematochezia yesterday or this morning. No complaints.   Denies chest pain or dyspnea.  Frequent urination on IV fluids Objective:  Vital signs in last 24 hours: Temp:  [97.9 F (36.6 C)-98.1 F (36.7 C)] 97.9 F (36.6 C) (10/24 0513) Pulse Rate:  [87-92] 89 (10/24 0513) Resp:  [14-22] 16 (10/24 0513) BP: (132-143)/(60-78) 132/78 (10/24 0513) SpO2:  [97 %-100 %] 100 % (10/24 0513) Last BM Date: 04/28/20 General:   Alert 84 year old female in NAD. Heart: RRR, no murmur.  Pulm: Breath sounds clear.  Abdomen: Soft, nondistended. Nontender. + BS x 4 quads. Lower abdominal midline scar intact/thickened.  Extremities:  Without edema. Neurologic:  Alert and  oriented x4;  grossly normal neurologically. Psych:  Alert and cooperative. Normal mood and affect.  Intake/Output from previous day: 10/23 0701 - 10/24 0700 In: 483 [P.O.:480; I.V.:3] Out: 1650 [Urine:1650] Intake/Output this shift: No intake/output data recorded.  Lab Results: Recent Labs    04/26/20 2052 04/27/20 0111 04/28/20 0137 04/28/20 0725 04/29/20 0553  WBC 4.3  --  5.1  --  4.7  HGB 9.1*   < > 7.9* 8.1* 8.1*  HCT 26.8*   < > 23.1* 23.6* 23.9*  PLT 213  --  190  --  184   < > = values in this interval not displayed.   BMET Recent Labs    04/27/20 0107 04/28/20 0137 04/29/20 0553  NA 139 139 139  K 3.0* 3.1* 3.5  CL 107 109 106  CO2 25 23 27   GLUCOSE 107* 79 116*  BUN 10 11 11   CREATININE 0.54 0.59 0.55  CALCIUM 9.0 9.2 9.6   LFT Recent Labs    04/27/20 0107  PROT 5.1*  ALBUMIN 3.0*  AST 16  ALT 12  ALKPHOS 57  BILITOT 0.4   PT/INR Recent Labs    04/26/20 1643  LABPROT 13.4  INR 1.1   Hepatitis Panel No results for input(s): HEPBSAG, HCVAB, HEPAIGM, HEPBIGM in the last 72  hours.  IR Angiogram Visceral Selective  Result Date: 04/27/2020 INDICATION: 84 year old female with acute lower gastrointestinal hemorrhage visualized on CT a the day prior from the transverse colon. Prior mesenteric angiogram without evidence of active hemorrhage. Presents with repeat hematochezia and acute anemia. EXAM: 1. Ultrasound-guided right common femoral vein access 2. Selective catheterization of the superior mesenteric artery 3. Selective catheterization of the middle colic artery 4. Super selective catheterization of a distal branch of the middle colic artery 5. Mesenteric angiography MEDICATIONS: None ANESTHESIA/SEDATION: Moderate (conscious) sedation was employed during this procedure. A total of Versed 1 mg and Fentanyl 25 mcg was administered intravenously. Moderate Sedation Time: 40 minutes. The patient's level of consciousness and vital signs were monitored continuously by radiology nursing throughout the procedure under my direct supervision. CONTRAST:  79mL OMNIPAQUE IOHEXOL 300 MG/ML SOLN, 110mL OMNIPAQUE IOHEXOL 300 MG/ML SOLN, 60mL OMNIPAQUE IOHEXOL 300 MG/ML SOLN FLUOROSCOPY TIME:  Fluoroscopy Time: 8 minutes 30 seconds (510 mGy). COMPLICATIONS: None immediate. PROCEDURE: Informed consent was obtained from the patient following explanation of the procedure, risks, benefits and alternatives. The patient understands, agrees and consents for the procedure. All questions were addressed. A time out was performed prior to the initiation of the procedure. Maximal barrier sterile technique utilized including caps, mask,  sterile gowns, sterile gloves, large sterile drape, hand hygiene, and Betadine prep. The right groin was prepped and draped in standard fashion. The inferior aspect of the femoral head was marked radiographically. Under direct ultrasound guidance, the distal common femoral vein was accessed with a micropuncture needle. The micropuncture sheath was placed and hand injection of  contrast was performed in a is lateral oblique to delineate appropriate puncture site for closure device usage. A Bentson wire was directed to the abdominal aorta. A 5 French, levin cm sheath was placed in the right common femoral vein. Over the Bentson wire, a 5 French C2 catheter was directed to the superior mesenteric artery ostium. Superior mesenteric angiogram was performed in the lateral projection. The superior mesenteric artery is widely patent with visualization of the middle colic artery arising anteriorly and coursing superiorly. Through the C2 catheter a J tipped direction microcatheter and single angle GT 0.018 inch wire were used to select the middle colic artery. Angiogram was performed from middle colic artery which demonstrated patency and no evidence of active extravasation. Angiogram was repeated in multiple obliquities to fully evaluate for possible hemorrhage however none was seen. A branch of the middle colic artery supplying the transverse colon was then selected with the microcatheter and wire. Repeat angiogram was performed from this location, still without apparent hemorrhage. The catheters were removed. A 6 French Angio-Seal device was used to close the right common femoral artery. The patient tolerated the procedure well without immediate complication. IMPRESSION: No evidence of active hemorrhage in the middle colic artery territory as was visualized on CTA the day prior. [name], MD [facility] Radiology Electronically Signed   By: [name] MD   On: 04/27/2020 16:25   IR Angiogram Selective Each Additional Vessel  Result Date: 04/27/2020 INDICATION: 84 year old female with acute lower gastrointestinal hemorrhage visualized on CT a the day prior from the transverse colon. Prior mesenteric angiogram without evidence of active hemorrhage. Presents with repeat hematochezia and acute anemia. EXAM: 1. Ultrasound-guided right  common femoral vein access 2. Selective catheterization of the superior mesenteric artery 3. Selective catheterization of the middle colic artery 4. Super selective catheterization of a distal branch of the middle colic artery 5. Mesenteric angiography MEDICATIONS: None ANESTHESIA/SEDATION: Moderate (conscious) sedation was employed during this procedure. A total of Versed 1 mg and Fentanyl 25 mcg was administered intravenously. Moderate Sedation Time: 40 minutes. The patient's level of consciousness and vital signs were monitored continuously by radiology nursing throughout the procedure under my direct supervision. CONTRAST:  78mL OMNIPAQUE IOHEXOL 300 MG/ML SOLN, 37mL OMNIPAQUE IOHEXOL 300 MG/ML SOLN, 63mL OMNIPAQUE IOHEXOL 300 MG/ML SOLN FLUOROSCOPY TIME:  Fluoroscopy Time: 8 minutes 30 seconds (510 mGy). COMPLICATIONS: None immediate. PROCEDURE: Informed consent was obtained from the patient following explanation of the procedure, risks, benefits and alternatives. The patient understands, agrees and consents for the procedure. All questions were addressed. A time out was performed prior to the initiation of the procedure. Maximal barrier sterile technique utilized including caps, mask, sterile gowns, sterile gloves, large sterile drape, hand hygiene, and Betadine prep. The right groin was prepped and draped in standard fashion. The inferior aspect of the femoral head was marked radiographically. Under direct ultrasound guidance, the distal common femoral vein was accessed with a micropuncture needle. The micropuncture sheath was placed and hand injection of contrast was performed in a is lateral oblique to delineate appropriate puncture site for closure device usage. A Bentson wire was directed to the  abdominal aorta. A 5 French, levin cm sheath was placed in the right common femoral vein. Over the Bentson wire, a 5 French C2 catheter was directed to the superior mesenteric artery ostium. Superior mesenteric  angiogram was performed in the lateral projection. The superior mesenteric artery is widely patent with visualization of the middle colic artery arising anteriorly and coursing superiorly. Through the C2 catheter a J tipped direction microcatheter and single angle GT 0.018 inch wire were used to select the middle colic artery. Angiogram was performed from middle colic artery which demonstrated patency and no evidence of active extravasation. Angiogram was repeated in multiple obliquities to fully evaluate for possible hemorrhage however none was seen. A branch of the middle colic artery supplying the transverse colon was then selected with the microcatheter and wire. Repeat angiogram was performed from this location, still without apparent hemorrhage. The catheters were removed. A 6 French Angio-Seal device was used to close the right common femoral artery. The patient tolerated the procedure well without immediate complication. IMPRESSION: No evidence of active hemorrhage in the middle colic artery territory as was visualized on CTA the day prior. [name], MD [facility] Radiology Electronically Signed   By: [name] MD   On: 04/27/2020 16:25   IR Angiogram Selective Each Additional Vessel  Result Date: 04/27/2020 INDICATION: 84 year old female with acute lower gastrointestinal hemorrhage visualized on CT a the day prior from the transverse colon. Prior mesenteric angiogram without evidence of active hemorrhage. Presents with repeat hematochezia and acute anemia. EXAM: 1. Ultrasound-guided right common femoral vein access 2. Selective catheterization of the superior mesenteric artery 3. Selective catheterization of the middle colic artery 4. Super selective catheterization of a distal branch of the middle colic artery 5. Mesenteric angiography MEDICATIONS: None ANESTHESIA/SEDATION: Moderate (conscious) sedation was employed during this procedure. A  total of Versed 1 mg and Fentanyl 25 mcg was administered intravenously. Moderate Sedation Time: 40 minutes. The patient's level of consciousness and vital signs were monitored continuously by radiology nursing throughout the procedure under my direct supervision. CONTRAST:  56mL OMNIPAQUE IOHEXOL 300 MG/ML SOLN, 8mL OMNIPAQUE IOHEXOL 300 MG/ML SOLN, 43mL OMNIPAQUE IOHEXOL 300 MG/ML SOLN FLUOROSCOPY TIME:  Fluoroscopy Time: 8 minutes 30 seconds (510 mGy). COMPLICATIONS: None immediate. PROCEDURE: Informed consent was obtained from the patient following explanation of the procedure, risks, benefits and alternatives. The patient understands, agrees and consents for the procedure. All questions were addressed. A time out was performed prior to the initiation of the procedure. Maximal barrier sterile technique utilized including caps, mask, sterile gowns, sterile gloves, large sterile drape, hand hygiene, and Betadine prep. The right groin was prepped and draped in standard fashion. The inferior aspect of the femoral head was marked radiographically. Under direct ultrasound guidance, the distal common femoral vein was accessed with a micropuncture needle. The micropuncture sheath was placed and hand injection of contrast was performed in a is lateral oblique to delineate appropriate puncture site for closure device usage. A Bentson wire was directed to the abdominal aorta. A 5 French, levin cm sheath was placed in the right common femoral vein. Over the Bentson wire, a 5 French C2 catheter was directed to the superior mesenteric artery ostium. Superior mesenteric angiogram was performed in the lateral projection. The superior mesenteric artery is widely patent with visualization of the middle colic artery arising anteriorly and coursing superiorly. Through the C2 catheter a J tipped direction microcatheter and single angle GT 0.018 inch wire were used to  select the middle colic artery. Angiogram was performed from  middle colic artery which demonstrated patency and no evidence of active extravasation. Angiogram was repeated in multiple obliquities to fully evaluate for possible hemorrhage however none was seen. A branch of the middle colic artery supplying the transverse colon was then selected with the microcatheter and wire. Repeat angiogram was performed from this location, still without apparent hemorrhage. The catheters were removed. A 6 French Angio-Seal device was used to close the right common femoral artery. The patient tolerated the procedure well without immediate complication. IMPRESSION: No evidence of active hemorrhage in the middle colic artery territory as was visualized on CTA the day prior. [name], MD [facility] Radiology Electronically Signed   By: [name] MD   On: 04/27/2020 16:25   IR Angiogram Selective Each Additional Vessel  Result Date: 04/27/2020 INDICATION: 84 year old female with acute lower gastrointestinal hemorrhage visualized on CT a the day prior from the transverse colon. Prior mesenteric angiogram without evidence of active hemorrhage. Presents with repeat hematochezia and acute anemia. EXAM: 1. Ultrasound-guided right common femoral vein access 2. Selective catheterization of the superior mesenteric artery 3. Selective catheterization of the middle colic artery 4. Super selective catheterization of a distal branch of the middle colic artery 5. Mesenteric angiography MEDICATIONS: None ANESTHESIA/SEDATION: Moderate (conscious) sedation was employed during this procedure. A total of Versed 1 mg and Fentanyl 25 mcg was administered intravenously. Moderate Sedation Time: 40 minutes. The patient's level of consciousness and vital signs were monitored continuously by radiology nursing throughout the procedure under my direct supervision. CONTRAST:  50mL OMNIPAQUE IOHEXOL 300 MG/ML SOLN, 40mL OMNIPAQUE IOHEXOL 300 MG/ML SOLN, 69mL  OMNIPAQUE IOHEXOL 300 MG/ML SOLN FLUOROSCOPY TIME:  Fluoroscopy Time: 8 minutes 30 seconds (510 mGy). COMPLICATIONS: None immediate. PROCEDURE: Informed consent was obtained from the patient following explanation of the procedure, risks, benefits and alternatives. The patient understands, agrees and consents for the procedure. All questions were addressed. A time out was performed prior to the initiation of the procedure. Maximal barrier sterile technique utilized including caps, mask, sterile gowns, sterile gloves, large sterile drape, hand hygiene, and Betadine prep. The right groin was prepped and draped in standard fashion. The inferior aspect of the femoral head was marked radiographically. Under direct ultrasound guidance, the distal common femoral vein was accessed with a micropuncture needle. The micropuncture sheath was placed and hand injection of contrast was performed in a is lateral oblique to delineate appropriate puncture site for closure device usage. A Bentson wire was directed to the abdominal aorta. A 5 French, levin cm sheath was placed in the right common femoral vein. Over the Bentson wire, a 5 French C2 catheter was directed to the superior mesenteric artery ostium. Superior mesenteric angiogram was performed in the lateral projection. The superior mesenteric artery is widely patent with visualization of the middle colic artery arising anteriorly and coursing superiorly. Through the C2 catheter a J tipped direction microcatheter and single angle GT 0.018 inch wire were used to select the middle colic artery. Angiogram was performed from middle colic artery which demonstrated patency and no evidence of active extravasation. Angiogram was repeated in multiple obliquities to fully evaluate for possible hemorrhage however none was seen. A branch of the middle colic artery supplying the transverse colon was then selected with the microcatheter and wire. Repeat angiogram was performed from this  location, still without apparent hemorrhage. The catheters were removed. A 6 French Angio-Seal device was used to  close the right common femoral artery. The patient tolerated the procedure well without immediate complication. IMPRESSION: No evidence of active hemorrhage in the middle colic artery territory as was visualized on CTA the day prior. [name], MD [facility] Radiology Electronically Signed   By: [name] MD   On: 04/27/2020 16:25   IR US Guide Vasc Access Right  Result Date: 04/27/2020 INDICATION: 84 year old female with acute lower gastrointestinal hemorrhage visualized on CT a the day prior from the transverse colon. Prior mesenteric angiogram without evidence of active hemorrhage. Presents with repeat hematochezia and acute anemia. EXAM: 1. Ultrasound-guided right common femoral vein access 2. Selective catheterization of the superior mesenteric artery 3. Selective catheterization of the middle colic artery 4. Super selective catheterization of a distal branch of the middle colic artery 5. Mesenteric angiography MEDICATIONS: None ANESTHESIA/SEDATION: Moderate (conscious) sedation was employed during this procedure. A total of Versed 1 mg and Fentanyl 25 mcg was administered intravenously. Moderate Sedation Time: 40 minutes. The patient's level of consciousness and vital signs were monitored continuously by radiology nursing throughout the procedure under my direct supervision. CONTRAST:  41mL OMNIPAQUE IOHEXOL 300 MG/ML SOLN, 8mL OMNIPAQUE IOHEXOL 300 MG/ML SOLN, 4mL OMNIPAQUE IOHEXOL 300 MG/ML SOLN FLUOROSCOPY TIME:  Fluoroscopy Time: 8 minutes 30 seconds (510 mGy). COMPLICATIONS: None immediate. PROCEDURE: Informed consent was obtained from the patient following explanation of the procedure, risks, benefits and alternatives. The patient understands, agrees and consents for the procedure. All questions were addressed. A time out was  performed prior to the initiation of the procedure. Maximal barrier sterile technique utilized including caps, mask, sterile gowns, sterile gloves, large sterile drape, hand hygiene, and Betadine prep. The right groin was prepped and draped in standard fashion. The inferior aspect of the femoral head was marked radiographically. Under direct ultrasound guidance, the distal common femoral vein was accessed with a micropuncture needle. The micropuncture sheath was placed and hand injection of contrast was performed in a is lateral oblique to delineate appropriate puncture site for closure device usage. A Bentson wire was directed to the abdominal aorta. A 5 French, levin cm sheath was placed in the right common femoral vein. Over the Bentson wire, a 5 French C2 catheter was directed to the superior mesenteric artery ostium. Superior mesenteric angiogram was performed in the lateral projection. The superior mesenteric artery is widely patent with visualization of the middle colic artery arising anteriorly and coursing superiorly. Through the C2 catheter a J tipped direction microcatheter and single angle GT 0.018 inch wire were used to select the middle colic artery. Angiogram was performed from middle colic artery which demonstrated patency and no evidence of active extravasation. Angiogram was repeated in multiple obliquities to fully evaluate for possible hemorrhage however none was seen. A branch of the middle colic artery supplying the transverse colon was then selected with the microcatheter and wire. Repeat angiogram was performed from this location, still without apparent hemorrhage. The catheters were removed. A 6 French Angio-Seal device was used to close the right common femoral artery. The patient tolerated the procedure well without immediate complication. IMPRESSION: No evidence of active hemorrhage in the middle colic artery territory as was visualized on CTA the day prior. [name], MD [facility]  Inter[facility] Radiology Special[facility] Radiology Electronically Signed   By: [name] MD   On: 04/27/2020 16:25    Assessment / Plan:  47. 84 year old female with history of recurrent diverticular bleed presents with recurrence of painless  hematochezia and questionable black solid stool with anemia. Hemoglobin 10.4 (base line Hg 13.9 on 07/28/2019). Abdominal/pelvic CT angiogramshowed active hemorrhage to the distal transverse colon. S/P mesenteric arteriogram by IR10/21 did not identify active bleeding, unable to cannulate the middle colic artery.  She had more hematochezia and a repeat is enteric angiogram 10/22 resulted in successful cannulation of the middle colic artery, angiogram without evidence of active hemorrhage for no further intervention was performed. Stable Hg 8.1. No hematochezia or BM yesterday or so far this morning. She remains hemodynamically stable. -CBC in am -Transfuse for Hg < 7.5 - 8 -Monitor patient closely for GI bleeding, nursing staff to call our service if she demonstrates further hematochezia -Soft diet -Further recommendations per Dr. Loletha Carrow  2.History of colon polyps per colonoscopy 02/2019 which were not removed in the setting of active GI bleeding -Eventual colonoscopy   3. Hypokalemia, improving. Today K+ 3.5.     Active Problems:   GIB (gastrointestinal bleeding)     LOS: 3 days   Noralyn Pick  04/29/2020, 8:35AM  I have discussed the case with the PA, and that is the plan I formulated. I personally interviewed and examined the patient.  Diverticular bleeding with severe acute blood loss anemia.  She has had multiple episodes of this in the past.  It could not be caught on 2 angiograms in less than 24 hours during this admission.  No bleeding for about 48 hours now.  Had a colonoscopy in 2020 in the same setting, I do not think she needs another one now.  No surgery indicated at this time.  If okay with the  internal medicine service, I think she can be discharged home today from a GI perspective.  Of course she would need assessment of safety while ambulating since she has been sick for several days in bed with acute blood loss anemia.  Return to hospital immediately for overt bleeding.  I ordered some IV iron.  Nelida Meuse III Office: [phone]

## 2020-04-29 NOTE — Plan of Care (Signed)
  Problem: Elimination: Goal: Will not experience complications related to bowel motility Outcome: Progressing   

## 2020-04-29 NOTE — Progress Notes (Signed)
PROGRESS NOTE    Latoya Bennett  WRU:[id number] DOB: 01-11-1936 DOA: 04/26/2020 PCP: Horald Pollen, MD   Chief Complain: Rectal bleeding  Brief Narrative: Patient is 84 year-old female with history of hypertension, diverticulosis who presents with complaint of 1 week history of rectal bleeding.  On presentation she was found to be anemic.  GI was consulted who recommended IR consult for possible embolization.  IR did CT angiogram with finding of source of bleeding from distal transverse colon but there was no active bleeding so no intervention done.  General surgery is also on board.  Hemoglobin this morning in the range of 8.  She had a large bloody bowel movement on the late morning of 10/22121 but  CT guided angiogram found to be negative for active bleeding.  Plan to continue monitoring for 24 hours before discharge planning after discussion with GI today.  Assessment & Plan:   Active Problems:   GIB (gastrointestinal bleeding)   Acute lower GI bleed: Diverticular bleed.  CT angiogram showed source of bleeding from distal transverse colon.  GI, surgery, IR following.  IR evaluated her for embolization but there was no active bleeding so intervention not done. Her baseline hemoglobin is in the range of 14, she presented with hemoglobin of 10 ,this morning hemoglobin is 8.  She was also given a unit of PRBC during this hospitalization.  We will continue monitor hemoglobin. She had a large bloody bowel movement on the late morning of 10/22121 but  CT guided angiogram found to be negative for active bleeding.  Plan to continue monitoring for 24 hours before discharge planning. Check CBC tomorrow She was also given a dose of IV iron today.  Hypertension: Blood pressure is currently stable.  Will hold antihypertensives at the moment.  Hypokalemia: Supplemented with potassium         DVT prophylaxis:SCD Code Status: Full Family Communication: Called sister on phone  [phone] on 10.22.21 Status is: Inpatient  Remains inpatient appropriate because:Inpatient level of care appropriate due to severity of illness   Dispo: The patient is from: Home              Anticipated d/c is to: Home              Anticipated d/c date [id number]              Patient currently is not medically stable to d/c.  Monitoring for 1 more night before  discharge.   Consultants: GI, general surgery, IR  Procedures: CT angiogram  Antimicrobials:  Anti-infectives (From admission, onward)   Start     Dose/Rate Route Frequency Ordered Stop   04/26/20 1815  ciprofloxacin (CIPRO) IVPB 400 mg        400 mg 200 mL/hr over 60 Minutes Intravenous  Once 04/26/20 1826 04/26/20 1912      Subjective: Patient seen and examined at bedside this morning.  Hemodynamically stable.  Denies any abdominal pain.  Since patient lives alone, we decided to monitor her one more night  Objective: Vitals:   04/28/20 0615 04/28/20 1400 04/28/20 2006 04/29/20 0513  BP: (!) 122/58 (!) 143/71 134/60 132/78  Pulse: 77 87 92 89  Resp: 18 (!) 22 14 16   Temp: 98.5 F (36.9 C) 98.1 F (36.7 C) 98.1 F (36.7 C) 97.9 F (36.6 C)  TempSrc: Oral Oral Oral Oral  SpO2: 100% 98% 97% 100%  Weight:      Height:        Intake/Output Summary (  Last 24 hours) at 04/29/2020 1237 Last data filed at 04/29/2020 1030 Gross per 24 hour  Intake 480 ml  Output 2450 ml  Net -1970 ml   Filed Weights   04/26/20 1112 04/26/20 1127  Weight: 56.7 kg 56.7 kg    Examination:  General exam: Appears calm and comfortable ,Not in distress, pleasant elderly female HEENT:PERRL,Oral mucosa moist, Ear/Nose normal on gross exam Respiratory system: Bilateral equal air entry, normal vesicular breath sounds, no wheezes or crackles  Cardiovascular system: S1 & S2 heard, RRR. No JVD, murmurs, rubs, gallops or clicks. Gastrointestinal system: Abdomen is nondistended, soft and nontender.  Central nervous system: Alert and  oriented. No focal neurological deficits. Extremities: No edema, no clubbing ,no cyanosis Skin: No rashes, lesions or ulcers,no icterus ,no pallor   Data Reviewed: I have personally reviewed following labs and imaging studies  CBC: Recent Labs  Lab 04/26/20 1227 04/26/20 1227 04/26/20 2052 04/27/20 0111 04/27/20 1625 04/27/20 2059 04/28/20 0137 04/28/20 0725 04/29/20 0553  WBC 4.6  --  4.3  --   --   --  5.1  --  4.7  NEUTROABS  --   --   --   --   --   --  3.0  --  2.8  HGB 10.4*   < > 9.1*   < > 8.5* 7.8* 7.9* 8.1* 8.1*  HCT 29.5*   < > 26.8*   < > 24.7* 22.4* 23.1* 23.6* 23.9*  MCV 74.7*  --  74.9*  --   --   --  76.2*  --  77.6*  PLT 212  --  213  --   --   --  190  --  184   < > = values in this interval not displayed.   Basic Metabolic Panel: Recent Labs  Lab 04/26/20 1227 04/26/20 2058 04/27/20 0107 04/27/20 0827 04/28/20 0137 04/29/20 0553  NA 139  --  139  --  139 139  K 3.3*  --  3.0*  --  3.1* 3.5  CL 102  --  107  --  109 106  CO2 28  --  25  --  23 27  GLUCOSE 106*  --  107*  --  79 116*  BUN 13  --  10  --  11 11  CREATININE 0.64  --  0.54  --  0.59 0.55  CALCIUM 9.9  --  9.0  --  9.2 9.6  MG  --  1.9  --  2.0  --   --    GFR: Estimated Creatinine Clearance: 41.6 mL/min (by C-G formula based on SCr of 0.55 mg/dL). Liver Function Tests: Recent Labs  Lab 04/26/20 1227 04/27/20 0107  AST 28 16  ALT 14 12  ALKPHOS 73 57  BILITOT 0.5 0.4  PROT 6.9 5.1*  ALBUMIN 4.0 3.0*   No results for input(s): LIPASE, AMYLASE in the last 168 hours. No results for input(s): AMMONIA in the last 168 hours. Coagulation Profile: Recent Labs  Lab 04/26/20 1643  INR 1.1   Cardiac Enzymes: No results for input(s): CKTOTAL, CKMB, CKMBINDEX, TROPONINI in the last 168 hours. BNP (last 3 results) No results for input(s): PROBNP in the last 8760 hours. HbA1C: No results for input(s): HGBA1C in the last 72 hours. CBG: No results for input(s): GLUCAP in the last  168 hours. Lipid Profile: No results for input(s): CHOL, HDL, LDLCALC, TRIG, CHOLHDL, LDLDIRECT in the last 72 hours. Thyroid Function Tests: No results for  input(s): TSH, T4TOTAL, FREET4, T3FREE, THYROIDAB in the last 72 hours. Anemia Panel: No results for input(s): VITAMINB12, FOLATE, FERRITIN, TIBC, IRON, RETICCTPCT in the last 72 hours. Sepsis Labs: No results for input(s): PROCALCITON, LATICACIDVEN in the last 168 hours.  Recent Results (from the past 240 hour(s))  Resp Panel by RT PCR (RSV, Flu A&B, Covid) - Nasopharyngeal Swab     Status: None   Collection Time: 04/26/20  8:03 PM   Specimen: Nasopharyngeal Swab  Result Value Ref Range Status   SARS Coronavirus 2 by RT PCR NEGATIVE NEGATIVE Final    Comment: (NOTE) SARS-CoV-2 target nucleic acids are NOT DETECTED.  The SARS-CoV-2 RNA is generally detectable in upper respiratoy specimens during the acute phase of infection. The lowest concentration of SARS-CoV-2 viral copies this assay can detect is 131 copies/mL. A negative result does not preclude SARS-Cov-2 infection and should not be used as the sole basis for treatment or other patient management decisions. A negative result may occur with  improper specimen collection/handling, submission of specimen other than nasopharyngeal swab, presence of viral mutation(s) within the areas targeted by this assay, and inadequate number of viral copies (<131 copies/mL). A negative result must be combined with clinical observations, patient history, and epidemiological information. The expected result is Negative.  Fact Sheet for Patients:  PinkCheek.be  Fact Sheet for Healthcare Providers:  GravelBags.it  This test is no t yet approved or cleared by the [place] FDA and  has been authorized for detection and/or diagnosis of SARS-CoV-2 by FDA under an Emergency Use Authorization (EUA). This EUA will remain  in effect  (meaning this test can be used) for the duration of the COVID-19 declaration under Section 564(b)(1) of the Act, 21 U.S.C. section 360bbb-3(b)(1), unless the authorization is terminated or revoked sooner.     Influenza A by PCR NEGATIVE NEGATIVE Final   Influenza B by PCR NEGATIVE NEGATIVE Final    Comment: (NOTE) The Xpert Xpress SARS-CoV-2/FLU/RSV assay is intended as an aid in  the diagnosis of influenza from Nasopharyngeal swab specimens and  should not be used as a sole basis for treatment. Nasal washings and  aspirates are unacceptable for Xpert Xpress SARS-CoV-2/FLU/RSV  testing.  Fact Sheet for Patients: PinkCheek.be  Fact Sheet for Healthcare Providers: GravelBags.it  This test is not yet approved or cleared by the [place] FDA and  has been authorized for detection and/or diagnosis of SARS-CoV-2 by  FDA under an Emergency Use Authorization (EUA). This EUA will remain  in effect (meaning this test can be used) for the duration of the  Covid-19 declaration under Section 564(b)(1) of the Act, 21  U.S.C. section 360bbb-3(b)(1), unless the authorization is  terminated or revoked.    Respiratory Syncytial Virus by PCR NEGATIVE NEGATIVE Final    Comment: (NOTE) Fact Sheet for Patients: PinkCheek.be  Fact Sheet for Healthcare Providers: GravelBags.it  This test is not yet approved or cleared by the [place] FDA and  has been authorized for detection and/or diagnosis of SARS-CoV-2 by  FDA under an Emergency Use Authorization (EUA). This EUA will remain  in effect (meaning this test can be used) for the duration of the  COVID-19 declaration under Section 564(b)(1) of the Act, 21 U.S.C.  section 360bbb-3(b)(1), unless the authorization is terminated or  revoked. Performed at [facility], [place] [address] [place], [place] [address]           Radiology Studies: IR Angiogram Visceral Selective  Result Date: 04/27/2020  INDICATION: 84 year old female with acute lower gastrointestinal hemorrhage visualized on CT a the day prior from the transverse colon. Prior mesenteric angiogram without evidence of active hemorrhage. Presents with repeat hematochezia and acute anemia. EXAM: 1. Ultrasound-guided right common femoral vein access 2. Selective catheterization of the superior mesenteric artery 3. Selective catheterization of the middle colic artery 4. Super selective catheterization of a distal branch of the middle colic artery 5. Mesenteric angiography MEDICATIONS: None ANESTHESIA/SEDATION: Moderate (conscious) sedation was employed during this procedure. A total of Versed 1 mg and Fentanyl 25 mcg was administered intravenously. Moderate Sedation Time: 40 minutes. The patient's level of consciousness and vital signs were monitored continuously by radiology nursing throughout the procedure under my direct supervision. CONTRAST:  72mL OMNIPAQUE IOHEXOL 300 MG/ML SOLN, 47mL OMNIPAQUE IOHEXOL 300 MG/ML SOLN, 36mL OMNIPAQUE IOHEXOL 300 MG/ML SOLN FLUOROSCOPY TIME:  Fluoroscopy Time: 8 minutes 30 seconds (510 mGy). COMPLICATIONS: None immediate. PROCEDURE: Informed consent was obtained from the patient following explanation of the procedure, risks, benefits and alternatives. The patient understands, agrees and consents for the procedure. All questions were addressed. A time out was performed prior to the initiation of the procedure. Maximal barrier sterile technique utilized including caps, mask, sterile gowns, sterile gloves, large sterile drape, hand hygiene, and Betadine prep. The right groin was prepped and draped in standard fashion. The inferior aspect of the femoral head was marked radiographically. Under direct ultrasound guidance, the distal common femoral vein was accessed with a micropuncture needle. The micropuncture sheath was placed  and hand injection of contrast was performed in a is lateral oblique to delineate appropriate puncture site for closure device usage. A Bentson wire was directed to the abdominal aorta. A 5 French, levin cm sheath was placed in the right common femoral vein. Over the Bentson wire, a 5 French C2 catheter was directed to the superior mesenteric artery ostium. Superior mesenteric angiogram was performed in the lateral projection. The superior mesenteric artery is widely patent with visualization of the middle colic artery arising anteriorly and coursing superiorly. Through the C2 catheter a J tipped direction microcatheter and single angle GT 0.018 inch wire were used to select the middle colic artery. Angiogram was performed from middle colic artery which demonstrated patency and no evidence of active extravasation. Angiogram was repeated in multiple obliquities to fully evaluate for possible hemorrhage however none was seen. A branch of the middle colic artery supplying the transverse colon was then selected with the microcatheter and wire. Repeat angiogram was performed from this location, still without apparent hemorrhage. The catheters were removed. A 6 French Angio-Seal device was used to close the right common femoral artery. The patient tolerated the procedure well without immediate complication. IMPRESSION: No evidence of active hemorrhage in the middle colic artery territory as was visualized on CTA the day prior. [name], MD [facility] Radiology Electronically Signed   By: [name] MD   On: 04/27/2020 16:25   IR Angiogram Selective Each Additional Vessel  Result Date: 04/27/2020 INDICATION: 84 year old female with acute lower gastrointestinal hemorrhage visualized on CT a the day prior from the transverse colon. Prior mesenteric angiogram without evidence of active hemorrhage. Presents with repeat hematochezia and acute anemia. EXAM: 1.  Ultrasound-guided right common femoral vein access 2. Selective catheterization of the superior mesenteric artery 3. Selective catheterization of the middle colic artery 4. Super selective catheterization of a distal branch of the middle colic artery 5. Mesenteric angiography MEDICATIONS: None ANESTHESIA/SEDATION: Moderate (conscious) sedation  was employed during this procedure. A total of Versed 1 mg and Fentanyl 25 mcg was administered intravenously. Moderate Sedation Time: 40 minutes. The patient's level of consciousness and vital signs were monitored continuously by radiology nursing throughout the procedure under my direct supervision. CONTRAST:  59mL OMNIPAQUE IOHEXOL 300 MG/ML SOLN, 66mL OMNIPAQUE IOHEXOL 300 MG/ML SOLN, 63mL OMNIPAQUE IOHEXOL 300 MG/ML SOLN FLUOROSCOPY TIME:  Fluoroscopy Time: 8 minutes 30 seconds (510 mGy). COMPLICATIONS: None immediate. PROCEDURE: Informed consent was obtained from the patient following explanation of the procedure, risks, benefits and alternatives. The patient understands, agrees and consents for the procedure. All questions were addressed. A time out was performed prior to the initiation of the procedure. Maximal barrier sterile technique utilized including caps, mask, sterile gowns, sterile gloves, large sterile drape, hand hygiene, and Betadine prep. The right groin was prepped and draped in standard fashion. The inferior aspect of the femoral head was marked radiographically. Under direct ultrasound guidance, the distal common femoral vein was accessed with a micropuncture needle. The micropuncture sheath was placed and hand injection of contrast was performed in a is lateral oblique to delineate appropriate puncture site for closure device usage. A Bentson wire was directed to the abdominal aorta. A 5 French, levin cm sheath was placed in the right common femoral vein. Over the Bentson wire, a 5 French C2 catheter was directed to the superior mesenteric artery  ostium. Superior mesenteric angiogram was performed in the lateral projection. The superior mesenteric artery is widely patent with visualization of the middle colic artery arising anteriorly and coursing superiorly. Through the C2 catheter a J tipped direction microcatheter and single angle GT 0.018 inch wire were used to select the middle colic artery. Angiogram was performed from middle colic artery which demonstrated patency and no evidence of active extravasation. Angiogram was repeated in multiple obliquities to fully evaluate for possible hemorrhage however none was seen. A branch of the middle colic artery supplying the transverse colon was then selected with the microcatheter and wire. Repeat angiogram was performed from this location, still without apparent hemorrhage. The catheters were removed. A 6 French Angio-Seal device was used to close the right common femoral artery. The patient tolerated the procedure well without immediate complication. IMPRESSION: No evidence of active hemorrhage in the middle colic artery territory as was visualized on CTA the day prior. [name], MD [facility] Radiology Electronically Signed   By: [name] MD   On: 04/27/2020 16:25   IR Angiogram Selective Each Additional Vessel  Result Date: 04/27/2020 INDICATION: 84 year old female with acute lower gastrointestinal hemorrhage visualized on CT a the day prior from the transverse colon. Prior mesenteric angiogram without evidence of active hemorrhage. Presents with repeat hematochezia and acute anemia. EXAM: 1. Ultrasound-guided right common femoral vein access 2. Selective catheterization of the superior mesenteric artery 3. Selective catheterization of the middle colic artery 4. Super selective catheterization of a distal branch of the middle colic artery 5. Mesenteric angiography MEDICATIONS: None ANESTHESIA/SEDATION: Moderate (conscious) sedation was employed  during this procedure. A total of Versed 1 mg and Fentanyl 25 mcg was administered intravenously. Moderate Sedation Time: 40 minutes. The patient's level of consciousness and vital signs were monitored continuously by radiology nursing throughout the procedure under my direct supervision. CONTRAST:  16mL OMNIPAQUE IOHEXOL 300 MG/ML SOLN, 68mL OMNIPAQUE IOHEXOL 300 MG/ML SOLN, 50mL OMNIPAQUE IOHEXOL 300 MG/ML SOLN FLUOROSCOPY TIME:  Fluoroscopy Time: 8 minutes 30 seconds (510 mGy). COMPLICATIONS: None immediate. PROCEDURE: Informed  consent was obtained from the patient following explanation of the procedure, risks, benefits and alternatives. The patient understands, agrees and consents for the procedure. All questions were addressed. A time out was performed prior to the initiation of the procedure. Maximal barrier sterile technique utilized including caps, mask, sterile gowns, sterile gloves, large sterile drape, hand hygiene, and Betadine prep. The right groin was prepped and draped in standard fashion. The inferior aspect of the femoral head was marked radiographically. Under direct ultrasound guidance, the distal common femoral vein was accessed with a micropuncture needle. The micropuncture sheath was placed and hand injection of contrast was performed in a is lateral oblique to delineate appropriate puncture site for closure device usage. A Bentson wire was directed to the abdominal aorta. A 5 French, levin cm sheath was placed in the right common femoral vein. Over the Bentson wire, a 5 French C2 catheter was directed to the superior mesenteric artery ostium. Superior mesenteric angiogram was performed in the lateral projection. The superior mesenteric artery is widely patent with visualization of the middle colic artery arising anteriorly and coursing superiorly. Through the C2 catheter a J tipped direction microcatheter and single angle GT 0.018 inch wire were used to select the middle colic artery.  Angiogram was performed from middle colic artery which demonstrated patency and no evidence of active extravasation. Angiogram was repeated in multiple obliquities to fully evaluate for possible hemorrhage however none was seen. A branch of the middle colic artery supplying the transverse colon was then selected with the microcatheter and wire. Repeat angiogram was performed from this location, still without apparent hemorrhage. The catheters were removed. A 6 French Angio-Seal device was used to close the right common femoral artery. The patient tolerated the procedure well without immediate complication. IMPRESSION: No evidence of active hemorrhage in the middle colic artery territory as was visualized on CTA the day prior. [name], MD [facility] Radiology Electronically Signed   By: [name] MD   On: 04/27/2020 16:25   IR Angiogram Selective Each Additional Vessel  Result Date: 04/27/2020 INDICATION: 84 year old female with acute lower gastrointestinal hemorrhage visualized on CT a the day prior from the transverse colon. Prior mesenteric angiogram without evidence of active hemorrhage. Presents with repeat hematochezia and acute anemia. EXAM: 1. Ultrasound-guided right common femoral vein access 2. Selective catheterization of the superior mesenteric artery 3. Selective catheterization of the middle colic artery 4. Super selective catheterization of a distal branch of the middle colic artery 5. Mesenteric angiography MEDICATIONS: None ANESTHESIA/SEDATION: Moderate (conscious) sedation was employed during this procedure. A total of Versed 1 mg and Fentanyl 25 mcg was administered intravenously. Moderate Sedation Time: 40 minutes. The patient's level of consciousness and vital signs were monitored continuously by radiology nursing throughout the procedure under my direct supervision. CONTRAST:  46mL OMNIPAQUE IOHEXOL 300 MG/ML SOLN, 24mL OMNIPAQUE  IOHEXOL 300 MG/ML SOLN, 56mL OMNIPAQUE IOHEXOL 300 MG/ML SOLN FLUOROSCOPY TIME:  Fluoroscopy Time: 8 minutes 30 seconds (510 mGy). COMPLICATIONS: None immediate. PROCEDURE: Informed consent was obtained from the patient following explanation of the procedure, risks, benefits and alternatives. The patient understands, agrees and consents for the procedure. All questions were addressed. A time out was performed prior to the initiation of the procedure. Maximal barrier sterile technique utilized including caps, mask, sterile gowns, sterile gloves, large sterile drape, hand hygiene, and Betadine prep. The right groin was prepped and draped in standard fashion. The inferior aspect of the femoral head was marked radiographically.  Under direct ultrasound guidance, the distal common femoral vein was accessed with a micropuncture needle. The micropuncture sheath was placed and hand injection of contrast was performed in a is lateral oblique to delineate appropriate puncture site for closure device usage. A Bentson wire was directed to the abdominal aorta. A 5 French, levin cm sheath was placed in the right common femoral vein. Over the Bentson wire, a 5 French C2 catheter was directed to the superior mesenteric artery ostium. Superior mesenteric angiogram was performed in the lateral projection. The superior mesenteric artery is widely patent with visualization of the middle colic artery arising anteriorly and coursing superiorly. Through the C2 catheter a J tipped direction microcatheter and single angle GT 0.018 inch wire were used to select the middle colic artery. Angiogram was performed from middle colic artery which demonstrated patency and no evidence of active extravasation. Angiogram was repeated in multiple obliquities to fully evaluate for possible hemorrhage however none was seen. A branch of the middle colic artery supplying the transverse colon was then selected with the microcatheter and wire. Repeat angiogram  was performed from this location, still without apparent hemorrhage. The catheters were removed. A 6 French Angio-Seal device was used to close the right common femoral artery. The patient tolerated the procedure well without immediate complication. IMPRESSION: No evidence of active hemorrhage in the middle colic artery territory as was visualized on CTA the day prior. [name], MD [facility] Radiology Electronically Signed   By: [name] MD   On: 04/27/2020 16:25   IR US Guide Vasc Access Right  Result Date: 04/27/2020 INDICATION: 84 year old female with acute lower gastrointestinal hemorrhage visualized on CT a the day prior from the transverse colon. Prior mesenteric angiogram without evidence of active hemorrhage. Presents with repeat hematochezia and acute anemia. EXAM: 1. Ultrasound-guided right common femoral vein access 2. Selective catheterization of the superior mesenteric artery 3. Selective catheterization of the middle colic artery 4. Super selective catheterization of a distal branch of the middle colic artery 5. Mesenteric angiography MEDICATIONS: None ANESTHESIA/SEDATION: Moderate (conscious) sedation was employed during this procedure. A total of Versed 1 mg and Fentanyl 25 mcg was administered intravenously. Moderate Sedation Time: 40 minutes. The patient's level of consciousness and vital signs were monitored continuously by radiology nursing throughout the procedure under my direct supervision. CONTRAST:  1mL OMNIPAQUE IOHEXOL 300 MG/ML SOLN, 39mL OMNIPAQUE IOHEXOL 300 MG/ML SOLN, 37mL OMNIPAQUE IOHEXOL 300 MG/ML SOLN FLUOROSCOPY TIME:  Fluoroscopy Time: 8 minutes 30 seconds (510 mGy). COMPLICATIONS: None immediate. PROCEDURE: Informed consent was obtained from the patient following explanation of the procedure, risks, benefits and alternatives. The patient understands, agrees and consents for the procedure. All questions were  addressed. A time out was performed prior to the initiation of the procedure. Maximal barrier sterile technique utilized including caps, mask, sterile gowns, sterile gloves, large sterile drape, hand hygiene, and Betadine prep. The right groin was prepped and draped in standard fashion. The inferior aspect of the femoral head was marked radiographically. Under direct ultrasound guidance, the distal common femoral vein was accessed with a micropuncture needle. The micropuncture sheath was placed and hand injection of contrast was performed in a is lateral oblique to delineate appropriate puncture site for closure device usage. A Bentson wire was directed to the abdominal aorta. A 5 French, levin cm sheath was placed in the right common femoral vein. Over the Bentson wire, a 5 French C2 catheter was directed to the superior mesenteric artery  ostium. Superior mesenteric angiogram was performed in the lateral projection. The superior mesenteric artery is widely patent with visualization of the middle colic artery arising anteriorly and coursing superiorly. Through the C2 catheter a J tipped direction microcatheter and single angle GT 0.018 inch wire were used to select the middle colic artery. Angiogram was performed from middle colic artery which demonstrated patency and no evidence of active extravasation. Angiogram was repeated in multiple obliquities to fully evaluate for possible hemorrhage however none was seen. A branch of the middle colic artery supplying the transverse colon was then selected with the microcatheter and wire. Repeat angiogram was performed from this location, still without apparent hemorrhage. The catheters were removed. A 6 French Angio-Seal device was used to close the right common femoral artery. The patient tolerated the procedure well without immediate complication. IMPRESSION: No evidence of active hemorrhage in the middle colic artery territory as was visualized on CTA the day prior.  [name], MD [facility] Radiology Electronically Signed   By: [name] MD   On: 04/27/2020 16:25        Scheduled Meds: . feeding supplement  1 Container Oral TID BM  . ferrous sulfate  325 mg Oral Q breakfast  . sodium chloride flush  3 mL Intravenous Q12H   Continuous Infusions:    LOS: 3 days    Time spent: 35 mins.More than 50% of that time was spent in counseling and/or coordination of care.      Shelly Coss, MD [facility] P10/24/2021, 12:37 PM

## 2020-04-30 DIAGNOSIS — K922 Gastrointestinal hemorrhage, unspecified: Secondary | ICD-10-CM | POA: Diagnosis not present

## 2020-04-30 LAB — TYPE AND SCREEN
ABO/RH(D): O POS
Antibody Screen: NEGATIVE
Unit division: 0
Unit division: 0
Unit division: 0

## 2020-04-30 LAB — CBC WITH DIFFERENTIAL/PLATELET
Abs Immature Granulocytes: 0.02 10*3/uL (ref 0.00–0.07)
Basophils Absolute: 0 10*3/uL (ref 0.0–0.1)
Basophils Relative: 0 %
Eosinophils Absolute: 0.1 10*3/uL (ref 0.0–0.5)
Eosinophils Relative: 1 %
HCT: 23.4 % — ABNORMAL LOW (ref 36.0–46.0)
Hemoglobin: 8.1 g/dL — ABNORMAL LOW (ref 12.0–15.0)
Immature Granulocytes: 0 %
Lymphocytes Relative: 20 %
Lymphs Abs: 1.2 10*3/uL (ref 0.7–4.0)
MCH: 26.2 pg (ref 26.0–34.0)
MCHC: 34.6 g/dL (ref 30.0–36.0)
MCV: 75.7 fL — ABNORMAL LOW (ref 80.0–100.0)
Monocytes Absolute: 0.5 10*3/uL (ref 0.1–1.0)
Monocytes Relative: 8 %
Neutro Abs: 4.5 10*3/uL (ref 1.7–7.7)
Neutrophils Relative %: 71 %
Platelets: 180 10*3/uL (ref 150–400)
RBC: 3.09 MIL/uL — ABNORMAL LOW (ref 3.87–5.11)
RDW: 15.5 % (ref 11.5–15.5)
WBC: 6.3 10*3/uL (ref 4.0–10.5)
nRBC: 0.3 % — ABNORMAL HIGH (ref 0.0–0.2)

## 2020-04-30 LAB — BPAM RBC
Blood Product Expiration Date: 202111012359
Blood Product Expiration Date: 202111052359
Blood Product Expiration Date: 202111212359
Unit Type and Rh: 5100
Unit Type and Rh: 5100
Unit Type and Rh: 5100

## 2020-04-30 MED ORDER — DICLOFENAC SODIUM 1 % EX GEL
2.0000 g | Freq: Four times a day (QID) | CUTANEOUS | Status: DC
  Filled 2020-04-30: qty 100

## 2020-04-30 MED ORDER — DICLOFENAC SODIUM 1 % EX GEL: 2.0000 g | Freq: Four times a day (QID) | CUTANEOUS | 2 refills | Status: AC

## 2020-04-30 NOTE — Discharge Instructions (Signed)
Gastrointestinal Bleeding Gastrointestinal (GI) bleeding is bleeding somewhere along the path that food travels through the body (digestive tract). This path is anywhere between the mouth and the opening of the butt (anus). You may have blood in your poop (stool) or have black poop. If you throw up (vomit), there may be blood in it. This condition can be mild, serious, or even life-threatening. If you have a lot of bleeding, you may need to stay in the hospital. What are the causes? This condition may be caused by:  Irritation and swelling of the esophagus (esophagitis). The esophagus is part of the body that moves food from your mouth to your stomach.  Swollen veins in the butt (hemorrhoids).  Areas of painful tearing in the opening of the butt (anal fissures). These are often caused by passing hard poop.  Pouches that form on the colon over time (diverticulosis).  Irritation and swelling (diverticulitis) in areas where pouches have formed on the colon.  Growths (polyps) or cancer. Colon cancer often starts out as growths that are not cancer.  Irritation of the stomach lining (gastritis).  Sores (ulcers) in the stomach. What increases the risk? You are more likely to develop this condition if you:  Have a certain type of infection in your stomach (Helicobacter pylori infection).  Take certain medicines.  Smoke.  Drink alcohol. What are the signs or symptoms? Common symptoms of this condition include:  Throwing up (vomiting) material that has bright red blood in it. It may look like coffee grounds.  Changes in your poop. The poop may: ? Have red blood in it. ? Be black, look like tar, and smell stronger than normal. ? Be red.  Pain or cramping in the belly (abdomen). How is this treated? Treatment for this condition depends on the cause of the bleeding. For example:  Sometimes, the bleeding can be stopped during a procedure that is done to find the problem (endoscopy or  colonoscopy).  Medicines can be used to: ? Help control irritation, swelling, or infection. ? Reduce acid in your stomach.  Certain problems can be treated with: ? Creams. ? Medicines that are put in the butt (suppositories). ? Warm baths.  Surgery is sometimes needed.  If you lose a lot of blood, you may need a blood transfusion. If bleeding is mild, you may be allowed to go home. If there is a lot of bleeding, you will need to stay in the hospital. Follow these instructions at home:   Take over-the-counter and prescription medicines only as told by your doctor.  Eat foods that have a lot of fiber in them. These foods include beans, whole grains, and fresh fruits and vegetables. You can also try eating 1-3 prunes each day.  Drink enough fluid to keep your pee (urine) pale yellow.  Keep all follow-up visits as told by your doctor. This is important. Contact a doctor if:  Your symptoms do not get better. Get help right away if:  Your bleeding does not stop.  You feel dizzy or you pass out (faint).  You feel weak.  You have very bad cramps in your back or belly.  You pass large clumps of blood (clots) in your poop.  Your symptoms are getting worse.  You have chest pain or fast heartbeats. Summary  GI bleeding is bleeding somewhere along the path that food travels through the body (digestive tract).  This bleeding can be caused by many things. Treatment depends on the cause of the bleeding.    Take medicines only as told by your doctor.  Keep all follow-up visits as told by your doctor. This is important. This information is not intended to replace advice given to you by your health care provider. Make sure you discuss any questions you have with your health care provider. Document Revised: 02/03/2018 Document Reviewed: 02/03/2018 Elsevier Patient Education  [place].   Gastrointestinal Bleeding Scan A gastrointestinal bleeding scan is a procedure that  is used to locate sites of bleeding in the bowel. You may need this scan if you have symptoms of gastrointestinal bleeding, such as passing bloody stools (feces), or passing black, tarry stools. The scan may also be used to correct bleeding in these areas before surgery is done. For this scan, a small amount of a radioactive material (tracer) is injected into your blood. A scanner with a camera that detects the radioactive tracer will show if any of the material gets into your abdomen or bowel. If it does, this indicates the site of bleeding. Tell a health care provider about:  Any allergies you have.  All medicines you are taking, including vitamins, herbs, eye drops, creams, and over-the-counter medicines.  Any blood disorders you have.  Any surgeries you have had.  Any medical conditions you have.  Whether you are pregnant or may be pregnant.  Whether you are breastfeeding. What are the risks? Generally, this is a safe procedure. However, problems may occur, including:  Exposure to radiation (a small amount of it).  Allergic reaction to the radioactive tracer. This is rare, but it may include swelling of the lips or tongue and difficulty breathing. What happens before the procedure?  Ask your health care provider about changing or stopping your regular medicines.  Follow instructions from your health care provider about eating or drinking restrictions. What happens during the procedure?   An IV will be inserted into one of your veins.  A small amount of radioactive tracer will be injected through the IV. Some of your blood may be drawn and mixed with the radioactive tracer before it is injected through the IV.  As the radioactive tracer travels through your bloodstream, images of your abdomen will be taken. These images will be taken at short intervals of 5-15 minutes during the procedure. If the images show radioactive tracer in the abdomen or bowel, this indicates the site of  bleeding.  Additional images may be taken every hour for up to 24 hours after the injection of the radioactive tracer. This may be needed to help identify a site of slow bleeding or bleeding that comes and goes. The procedure may vary among health care providers and hospitals. What can I expect after the procedure?   Return to your normal activities and diet as told by your health care provider.  The radioactive tracer will leave your body over the next few days. Drink enough fluid to keep your urine pale yellow. This will help flush the tracer out of your body.  It is up to you to get the results of your procedure. Ask your health care provider, or the department that is doing the procedure, when and how you will get your results. Summary  A gastrointestinal bleeding scan is a procedure that is used to locate sites of bleeding in the bowel.  You may need this scan if you have symptoms of gastrointestinal bleeding, such as passing bloody stools or passing black, tarry stools.  For this scan, a small amount of a radioactive material (tracer)  is injected into your blood. A scanner with a camera that detects the radioactive tracer will show if any of the material gets into your abdomen or bowel. If it does, this indicates the site of bleeding.  Ask your health care provider if you can take your regular medicines before the procedure and whether you have diet restrictions.  Ask your health care provider, or the department that is doing the procedure, when and how you will get your results. This information is not intended to replace advice given to you by your health care provider. Make sure you discuss any questions you have with your health care provider. Document Revised: 06/05/2017 Document Reviewed: 06/03/2017 Elsevier Patient Education  [place].   Hypertension, Adult High blood pressure (hypertension) is when the force of blood pumping through the arteries is too strong. The  arteries are the blood vessels that carry blood from the heart throughout the body. Hypertension forces the heart to work harder to pump blood and may cause arteries to become narrow or stiff. Untreated or uncontrolled hypertension can cause a heart attack, heart failure, a stroke, kidney disease, and other problems. A blood pressure reading consists of a higher number over a lower number. Ideally, your blood pressure should be below 120/80. The first ("top") number is called the systolic pressure. It is a measure of the pressure in your arteries as your heart beats. The second ("bottom") number is called the diastolic pressure. It is a measure of the pressure in your arteries as the heart relaxes. What are the causes? The exact cause of this condition is not known. There are some conditions that result in or are related to high blood pressure. What increases the risk? Some risk factors for high blood pressure are under your control. The following factors may make you more likely to develop this condition:  Smoking.  Having type 2 diabetes mellitus, high cholesterol, or both.  Not getting enough exercise or physical activity.  Being overweight.  Having too much fat, sugar, calories, or salt (sodium) in your diet.  Drinking too much alcohol. Some risk factors for high blood pressure may be difficult or impossible to change. Some of these factors include:  Having chronic kidney disease.  Having a family history of high blood pressure.  Age. Risk increases with age.  Race. You may be at higher risk if you are African American.  Gender. Men are at higher risk than women before age 11. After age 26, women are at higher risk than men.  Having obstructive sleep apnea.  Stress. What are the signs or symptoms? High blood pressure may not cause symptoms. Very high blood pressure (hypertensive crisis) may cause:  Headache.  Anxiety.  Shortness of breath.  Nosebleed.  Nausea and  vomiting.  Vision changes.  Severe chest pain.  Seizures. How is this diagnosed? This condition is diagnosed by measuring your blood pressure while you are seated, with your arm resting on a flat surface, your legs uncrossed, and your feet flat on the floor. The cuff of the blood pressure monitor will be placed directly against the skin of your upper arm at the level of your heart. It should be measured at least twice using the same arm. Certain conditions can cause a difference in blood pressure between your right and left arms. Certain factors can cause blood pressure readings to be lower or higher than normal for a short period of time:  When your blood pressure is higher when you are  in a health care provider's office than when you are at home, this is called white coat hypertension. Most people with this condition do not need medicines.  When your blood pressure is higher at home than when you are in a health care provider's office, this is called masked hypertension. Most people with this condition may need medicines to control blood pressure. If you have a high blood pressure reading during one visit or you have normal blood pressure with other risk factors, you may be asked to:  Return on a different day to have your blood pressure checked again.  Monitor your blood pressure at home for 1 week or longer. If you are diagnosed with hypertension, you may have other blood or imaging tests to help your health care provider understand your overall risk for other conditions. How is this treated? This condition is treated by making healthy lifestyle changes, such as eating healthy foods, exercising more, and reducing your alcohol intake. Your health care provider may prescribe medicine if lifestyle changes are not enough to get your blood pressure under control, and if:  Your systolic blood pressure is above 130.  Your diastolic blood pressure is above 80. Your personal target blood  pressure may vary depending on your medical conditions, your age, and other factors. Follow these instructions at home: Eating and drinking   Eat a diet that is high in fiber and potassium, and low in sodium, added sugar, and fat. An example eating plan is called the DASH (Dietary Approaches to Stop Hypertension) diet. To eat this way: ? Eat plenty of fresh fruits and vegetables. Try to fill one half of your plate at each meal with fruits and vegetables. ? Eat whole grains, such as whole-wheat pasta, brown rice, or whole-grain bread. Fill about one fourth of your plate with whole grains. ? Eat or drink low-fat dairy products, such as skim milk or low-fat yogurt. ? Avoid fatty cuts of meat, processed or cured meats, and poultry with skin. Fill about one fourth of your plate with lean proteins, such as fish, chicken without skin, beans, eggs, or tofu. ? Avoid pre-made and processed foods. These tend to be higher in sodium, added sugar, and fat.  Reduce your daily sodium intake. Most people with hypertension should eat less than 1,500 mg of sodium a day.  Do not drink alcohol if: ? Your health care provider tells you not to drink. ? You are pregnant, may be pregnant, or are planning to become pregnant.  If you drink alcohol: ? Limit how much you use to:  0-1 drink a day for women.  0-2 drinks a day for men. ? Be aware of how much alcohol is in your drink. In the [place] one drink equals one 12 oz bottle of beer (355 mL), one 5 oz glass of wine (148 mL), or one 1 oz glass of hard liquor (44 mL). Lifestyle   Work with your health care provider to maintain a healthy body weight or to lose weight. Ask what an ideal weight is for you.  Get at least 30 minutes of exercise most days of the week. Activities may include walking, swimming, or biking.  Include exercise to strengthen your muscles (resistance exercise), such as Pilates or lifting weights, as part of your weekly exercise routine. Try  to do these types of exercises for 30 minutes at least 3 days a week.  Do not use any products that contain nicotine or tobacco, such as cigarettes, e-cigarettes, and chewing  tobacco. If you need help quitting, ask your health care provider.  Monitor your blood pressure at home as told by your health care provider.  Keep all follow-up visits as told by your health care provider. This is important. Medicines  Take over-the-counter and prescription medicines only as told by your health care provider. Follow directions carefully. Blood pressure medicines must be taken as prescribed.  Do not skip doses of blood pressure medicine. Doing this puts you at risk for problems and can make the medicine less effective.  Ask your health care provider about side effects or reactions to medicines that you should watch for. Contact a health care provider if you:  Think you are having a reaction to a medicine you are taking.  Have headaches that keep coming back (recurring).  Feel dizzy.  Have swelling in your ankles.  Have trouble with your vision. Get help right away if you:  Develop a severe headache or confusion.  Have unusual weakness or numbness.  Feel faint.  Have severe pain in your chest or abdomen.  Vomit repeatedly.  Have trouble breathing. Summary  Hypertension is when the force of blood pumping through your arteries is too strong. If this condition is not controlled, it may put you at risk for serious complications.  Your personal target blood pressure may vary depending on your medical conditions, your age, and other factors. For most people, a normal blood pressure is less than 120/80.  Hypertension is treated with lifestyle changes, medicines, or a combination of both. Lifestyle changes include losing weight, eating a healthy, low-sodium diet, exercising more, and limiting alcohol. This information is not intended to replace advice given to you by your health care  provider. Make sure you discuss any questions you have with your health care provider. Document Revised: 03/03/2018 Document Reviewed: 03/03/2018 Elsevier Patient Education  [place] Gastrointestinal Bleeding  Lower gastrointestinal (GI) bleeding is the result of bleeding from the colon, rectum, or anal area. The colon is the last part of the digestive tract, where stool, also called feces, is formed. If you have lower GI bleeding, you may see blood in or on your stool. It may be bright red. Lower GI bleeding often stops without treatment. Continued or heavy bleeding needs emergency treatment at the hospital. What are the causes? Lower GI bleeding may be caused by:  A condition that causes pouches to form in the colon over time (diverticulosis).  Swelling and irritation (inflammation) in areas with diverticulosis (diverticulitis).  Inflammation of the colon (inflammatory bowel disease).  Swollen veins in the rectum (hemorrhoids).  Painful tears in the anus (anal fissures), often caused by passing hard stools.  Cancer of the colon or rectum.  Noncancerous growths (polyps) of the colon or rectum.  A bleeding disorder that impairs the formation of blood clots and causes easy bleeding (coagulopathy).  An abnormal weakening of a blood vessel where an artery and a vein come together (arteriovenous malformation). What increases the risk? You are more likely to develop this condition if:  You are older than 82 years of age.  You take aspirin or NSAIDs on a regular basis.  You take anticoagulant or antiplatelet drugs.  You have a history of high-dose X-ray treatment (radiation therapy) of the colon.  You recently had a colon polyp removed. What are the signs or symptoms? Symptoms of this condition include:  Bright red blood or blood clots coming from your rectum.  Bloody stools.  Black or maroon-colored  stools.  Pain or cramping in the abdomen.  Weakness  or dizziness.  Racing heartbeat. How is this diagnosed? This condition may be diagnosed based on:  Your symptoms and medical history.  A physical exam. During the exam, your health care provider will check for signs of blood loss, such as low blood pressure and a rapid pulse.  Tests, such as: ? Flexible sigmoidoscopy. In this procedure, a flexible tube with a camera on the end is used to examine your anus and the first part of your colon to look for the source of bleeding. ? Colonoscopy. This is similar to a flexible sigmoidoscopy, but the camera can extend all the way to the uppermost part of your colon. ? Blood tests to measure your red blood cell count and to check for coagulopathy. ? An imaging study of your colon to look for a bleeding site. In some cases, you may have X-rays taken after a dye or radioactive substance is injected into your bloodstream (angiogram). How is this treated? Treatment for this condition depends on the cause of the bleeding. Heavy or persistent bleeding is treated at the hospital. Treatment may include:  Getting fluids through an IV tube inserted into one of your veins.  Getting blood through an IV tube (blood transfusion).  Stopping bleeding through high-heat coagulation, injections of certain medicines, or applying surgical clips. This can all be done during a colonoscopy.  Having a procedure that involves first doing an angiogram and then blocking blood flow to the bleeding site (embolization).  Stopping some of your regular medicines for a certain amount of time.  Having surgery to remove part of the colon. This may be needed if bleeding is severe and does not respond to other treatment. Follow these instructions at home:  Take over-the-counter and prescription medicines only as told by your health care provider. You may need to avoid aspirin, NSAIDs, or other medicines that increase bleeding.  Eat foods that are high in fiber. This will help keep  your stools soft. These foods include whole grains, legumes, fruits, and vegetables. Eating 1-3 prunes each day works well for many people.  Drink enough fluid to keep your urine clear or pale yellow.  Keep all follow-up visits as told by your health care provider. This is important. Contact a health care provider if:  Your symptoms do not improve. Get help right away if:  Your bleeding increases.  You feel light-headed or you faint.  You feel weak.  You have severe cramps in your back or abdomen.  You pass large blood clots in your stool.  Your symptoms get worse. This information is not intended to replace advice given to you by your health care provider. Make sure you discuss any questions you have with your health care provider. Document Revised: 10/15/2018 Document Reviewed: 11/08/2015 Elsevier Patient Education  [place].

## 2020-04-30 NOTE — Progress Notes (Signed)
Discharge instructions explained to Mrs. [place]. One prescription called into her pharmacy. Next medication doses were written on discharge papers. Sister here  to pick up patient and bring her home. Patient was discharged via wheelchair.

## 2020-04-30 NOTE — Discharge Summary (Signed)
Physician Discharge Summary  [place] XQJ:[id number] DOB: [birth year] DOA: 04/26/2020  PCP: Horald Pollen, MD  Admit date: 04/26/2020 Discharge date: 04/30/2020  Admitted From: Home Disposition:  Home  Discharge Condition:Stable CODE STATUS:FULL Diet recommendation: tube feed   Brief/Interim Summary:  Patient is 84 year-old female with history of hypertension, diverticulosis who presents with complaint of 1 week history of rectal bleeding.  On presentation she was found to be anemic.  GI was consulted who recommended IR consult for possible embolization.  IR did CT angiogram with finding of source of bleeding from distal transverse colon but there was no active bleeding so no intervention done.  General surgery is also on board.  Hemoglobin this morning in the range of 8.  She had a large bloody bowel movement on the late morning of 10/22121 but  CT guided angiogram found to be negative for active bleeding.  Her hemoglobin is currently stable at 8 and she did not have any bleeding since last 48 hours so she is medically stable for discharge to home today.  Following problems were addressed during hospitalization:  Acute lower GI bleed: Diverticular bleed.  CT angiogram showed source of bleeding from distal transverse colon.  GI, surgery, IR following.  IR evaluated her for embolization but there was no active bleeding so intervention not done. Her baseline hemoglobin is in the range of 14, she presented with hemoglobin of 10 ,this morning hemoglobin is 8.  She was also given a unit of PRBC during this hospitalization. She had a large bloody bowel movement on the late morning of 10/22121 but  CT guided angiogram found to be negative for active bleeding.   Her hemoglobin is currently stable at 8 and she did not have any bleeding since last 48 hours so she is medically stable for discharge to home today. She was also given iron infusion during this hospitalization.  Check CBC  in a week by follow-up with primary care physician.  Hypertension: Blood pressure is currently stable.    I have recommended to stop amlodipine.  She can follow-up with her primary doctor and monitor her blood pressure at home.  Hypokalemia: Supplemented with potassium   Discharge Diagnoses:  Active Problems:   GIB (gastrointestinal bleeding)    Discharge Instructions  Discharge Instructions    Diet - low sodium heart healthy   Complete by: As directed    Discharge instructions   Complete by: As directed    1)Please take prescribed medications as instructed 2)Follow up with your PCP in a week and do a CBC test to check your hemoglobin 3)Follow up with gastroenterology as an outpatient 4)We have stopped amlodipine,your blood pressure pill because your BP is stable. Follow up with your PCP and make sure your blood pressure is high before restarting it   Increase activity slowly   Complete by: As directed    No wound care   Complete by: As directed      Allergies as of 04/30/2020   No Known Allergies     Medication List    STOP taking these medications   amLODipine 10 MG tablet Commonly known as: NORVASC   famotidine 20 MG tablet Commonly known as: PEPCID     TAKE these medications   diclofenac Sodium 1 % Gel Commonly known as: VOLTAREN Apply 2 g topically 4 (four) times daily.   feeding supplement Liqd Take 237 mLs by mouth 2 (two) times daily between meals.   FeroSul 325 (65 FE) MG tablet  Generic drug: ferrous sulfate TAKE 2 TABLETS(650 MG) BY MOUTH EVERY OTHER DAY What changed: See the new instructions.   multivitamin with minerals Tabs tablet Take 1 tablet by mouth daily.   Potassium 99 MG Tabs Take 99 mg by mouth daily.       Follow-up Information    Horald Pollen, MD. Schedule an appointment as soon as possible for a visit in 1 week(s).   Specialty: Internal Medicine Contact information: [place]  [address] [phone]              No Known Allergies  Consultations:  GI   Procedures/Studies: IR Angiogram Visceral Selective  Result Date: 04/27/2020 INDICATION: 84 year old female with acute lower gastrointestinal hemorrhage visualized on CT a the day prior from the transverse colon. Prior mesenteric angiogram without evidence of active hemorrhage. Presents with repeat hematochezia and acute anemia. EXAM: 1. Ultrasound-guided right common femoral vein access 2. Selective catheterization of the superior mesenteric artery 3. Selective catheterization of the middle colic artery 4. Super selective catheterization of a distal branch of the middle colic artery 5. Mesenteric angiography MEDICATIONS: None ANESTHESIA/SEDATION: Moderate (conscious) sedation was employed during this procedure. A total of Versed 1 mg and Fentanyl 25 mcg was administered intravenously. Moderate Sedation Time: 40 minutes. The patient's level of consciousness and vital signs were monitored continuously by radiology nursing throughout the procedure under my direct supervision. CONTRAST:  70mL OMNIPAQUE IOHEXOL 300 MG/ML SOLN, 98mL OMNIPAQUE IOHEXOL 300 MG/ML SOLN, 18mL OMNIPAQUE IOHEXOL 300 MG/ML SOLN FLUOROSCOPY TIME:  Fluoroscopy Time: 8 minutes 30 seconds (510 mGy). COMPLICATIONS: None immediate. PROCEDURE: Informed consent was obtained from the patient following explanation of the procedure, risks, benefits and alternatives. The patient understands, agrees and consents for the procedure. All questions were addressed. A time out was performed prior to the initiation of the procedure. Maximal barrier sterile technique utilized including caps, mask, sterile gowns, sterile gloves, large sterile drape, hand hygiene, and Betadine prep. The right groin was prepped and draped in standard fashion. The inferior aspect of the femoral head was marked radiographically. Under direct ultrasound guidance, the distal common femoral vein  was accessed with a micropuncture needle. The micropuncture sheath was placed and hand injection of contrast was performed in a is lateral oblique to delineate appropriate puncture site for closure device usage. A Bentson wire was directed to the abdominal aorta. A 5 French, levin cm sheath was placed in the right common femoral vein. Over the Bentson wire, a 5 French C2 catheter was directed to the superior mesenteric artery ostium. Superior mesenteric angiogram was performed in the lateral projection. The superior mesenteric artery is widely patent with visualization of the middle colic artery arising anteriorly and coursing superiorly. Through the C2 catheter a J tipped direction microcatheter and single angle GT 0.018 inch wire were used to select the middle colic artery. Angiogram was performed from middle colic artery which demonstrated patency and no evidence of active extravasation. Angiogram was repeated in multiple obliquities to fully evaluate for possible hemorrhage however none was seen. A branch of the middle colic artery supplying the transverse colon was then selected with the microcatheter and wire. Repeat angiogram was performed from this location, still without apparent hemorrhage. The catheters were removed. A 6 French Angio-Seal device was used to close the right common femoral artery. The patient tolerated the procedure well without immediate complication. IMPRESSION: No evidence of active hemorrhage in the middle colic artery territory as was visualized on CTA the day  prior. [name], MD [facility] Radiology Electronically Signed   By: [name] MD   On: 04/27/2020 16:25   IR Angiogram Visceral Selective  Result Date: 04/27/2020 INDICATION: 84 year old with GI bleeding. CTA demonstrated focal hemorrhage in the distal transverse colon region. Patient was consented for mesenteric arteriogram and possible embolization. EXAM:  MESENTERIC ARTERIOGRAM: SMA AND IMA ULTRASOUND GUIDANCE FOR VASCULAR ACCESS MEDICATIONS: Ciprofloxacin 400 mg. The antibiotic was administered within 1 hour of the procedure ANESTHESIA/SEDATION: Moderate (conscious) sedation was employed during this procedure. A total of Versed 1.0 mg and Fentanyl 25 mcg was administered intravenously. Moderate Sedation Time: 98 minutes. The patient's level of consciousness and vital signs were monitored continuously by radiology nursing throughout the procedure under my direct supervision. CONTRAST:  200 mg Omnipaque 300 FLUOROSCOPY TIME:  Fluoroscopy Time: 25 minutes, 36 seconds, 621 COMPLICATIONS: None immediate. PROCEDURE: Informed consent was obtained from the patient following explanation of the procedure, risks, benefits and alternatives. The patient understands, agrees and consents for the procedure. All questions were addressed. A time out was performed prior to the initiation of the procedure. Maximal barrier sterile technique utilized including caps, mask, sterile gowns, sterile gloves, large sterile drape, hand hygiene, and Betadine prep. Fluoroscopy demonstrated that the urinary bladder was massively distended with contrast. Therefore, Foley catheter was performed prior to the procedure. Ciprofloxacin was given due to the Foley catheter placement. Ultrasound demonstrated a patent right common femoral artery. Ultrasound image was saved for documentation. Right groin was prepped and draped in sterile fashion. Skin was anesthetized using 1% lidocaine. Small incision was made. Using ultrasound guidance, 21 gauge needle was directed into the right common femoral artery and a micropuncture dilator set was placed. Five French vascular sheath was placed. A Sos catheter was used to cannulate the SMA. SMA arteriograms were performed. Multiple attempts were made to cannulate the middle colic artery using a microcatheter but this was unsuccessful. Attention was directed to the IMA.  It was difficult to cannulate the IMA but it was eventually cannulated after the IMA was identified using a lateral projection and angiography. IMA injections were performed with attention to the left upper abdomen at the area of concern. No active bleeding was identified. The SMA was cannulated again using a RIM catheter. SMA arteriogram was performed. No active bleeding was identified. Again, attempted to cannulate the middle colic artery with a microcatheter but unable to cannulate the vessel. Five French catheter was removed over a Bentson wire. Angiography was performed through the right groin sheath. Right groin sheath was removed using an ExoSeal closure device. Right groin hemostasis at the end of the procedure. Bandage placed over the puncture site. FINDINGS: SMA is widely patent. No evidence for active bleeding. Attention was directed to the left upper quadrant in the distal transverse colon region. Middle colic artery appears to come off anteriorly but this could never be successfully cannulated. IMA is patent. Attention was directed to the left colic artery distribution. No significant filling of the distal transverse colon from the IMA distribution. No evidence of active bleeding from the IMA. IMPRESSION: Angiography of the superior mesenteric artery and inferior mesenteric artery. No evidence for active bleeding. Electronically Signed   By: Markus Daft M.D.   On: 04/27/2020 08:02   IR Angiogram Visceral Selective  Result Date: 04/27/2020 INDICATION: 84 year old with GI bleeding. CTA demonstrated focal hemorrhage in the distal transverse colon region. Patient was consented for mesenteric arteriogram and possible embolization. EXAM: MESENTERIC ARTERIOGRAM: SMA  AND IMA ULTRASOUND GUIDANCE FOR VASCULAR ACCESS MEDICATIONS: Ciprofloxacin 400 mg. The antibiotic was administered within 1 hour of the procedure ANESTHESIA/SEDATION: Moderate (conscious) sedation was employed during this procedure. A total of  Versed 1.0 mg and Fentanyl 25 mcg was administered intravenously. Moderate Sedation Time: 98 minutes. The patient's level of consciousness and vital signs were monitored continuously by radiology nursing throughout the procedure under my direct supervision. CONTRAST:  200 mg Omnipaque 300 FLUOROSCOPY TIME:  Fluoroscopy Time: 25 minutes, 36 seconds, 829 COMPLICATIONS: None immediate. PROCEDURE: Informed consent was obtained from the patient following explanation of the procedure, risks, benefits and alternatives. The patient understands, agrees and consents for the procedure. All questions were addressed. A time out was performed prior to the initiation of the procedure. Maximal barrier sterile technique utilized including caps, mask, sterile gowns, sterile gloves, large sterile drape, hand hygiene, and Betadine prep. Fluoroscopy demonstrated that the urinary bladder was massively distended with contrast. Therefore, Foley catheter was performed prior to the procedure. Ciprofloxacin was given due to the Foley catheter placement. Ultrasound demonstrated a patent right common femoral artery. Ultrasound image was saved for documentation. Right groin was prepped and draped in sterile fashion. Skin was anesthetized using 1% lidocaine. Small incision was made. Using ultrasound guidance, 21 gauge needle was directed into the right common femoral artery and a micropuncture dilator set was placed. Five French vascular sheath was placed. A Sos catheter was used to cannulate the SMA. SMA arteriograms were performed. Multiple attempts were made to cannulate the middle colic artery using a microcatheter but this was unsuccessful. Attention was directed to the IMA. It was difficult to cannulate the IMA but it was eventually cannulated after the IMA was identified using a lateral projection and angiography. IMA injections were performed with attention to the left upper abdomen at the area of concern. No active bleeding was  identified. The SMA was cannulated again using a RIM catheter. SMA arteriogram was performed. No active bleeding was identified. Again, attempted to cannulate the middle colic artery with a microcatheter but unable to cannulate the vessel. Five French catheter was removed over a Bentson wire. Angiography was performed through the right groin sheath. Right groin sheath was removed using an ExoSeal closure device. Right groin hemostasis at the end of the procedure. Bandage placed over the puncture site. FINDINGS: SMA is widely patent. No evidence for active bleeding. Attention was directed to the left upper quadrant in the distal transverse colon region. Middle colic artery appears to come off anteriorly but this could never be successfully cannulated. IMA is patent. Attention was directed to the left colic artery distribution. No significant filling of the distal transverse colon from the IMA distribution. No evidence of active bleeding from the IMA. IMPRESSION: Angiography of the superior mesenteric artery and inferior mesenteric artery. No evidence for active bleeding. Electronically Signed   By: Markus Daft M.D.   On: 04/27/2020 08:02   IR Angiogram Selective Each Additional Vessel  Result Date: 04/27/2020 INDICATION: 84 year old female with acute lower gastrointestinal hemorrhage visualized on CT a the day prior from the transverse colon. Prior mesenteric angiogram without evidence of active hemorrhage. Presents with repeat hematochezia and acute anemia. EXAM: 1. Ultrasound-guided right common femoral vein access 2. Selective catheterization of the superior mesenteric artery 3. Selective catheterization of the middle colic artery 4. Super selective catheterization of a distal branch of the middle colic artery 5. Mesenteric angiography MEDICATIONS: None ANESTHESIA/SEDATION: Moderate (conscious) sedation was employed during this procedure. A total of Versed  1 mg and Fentanyl 25 mcg was administered  intravenously. Moderate Sedation Time: 40 minutes. The patient's level of consciousness and vital signs were monitored continuously by radiology nursing throughout the procedure under my direct supervision. CONTRAST:  28mL OMNIPAQUE IOHEXOL 300 MG/ML SOLN, 38mL OMNIPAQUE IOHEXOL 300 MG/ML SOLN, 76mL OMNIPAQUE IOHEXOL 300 MG/ML SOLN FLUOROSCOPY TIME:  Fluoroscopy Time: 8 minutes 30 seconds (510 mGy). COMPLICATIONS: None immediate. PROCEDURE: Informed consent was obtained from the patient following explanation of the procedure, risks, benefits and alternatives. The patient understands, agrees and consents for the procedure. All questions were addressed. A time out was performed prior to the initiation of the procedure. Maximal barrier sterile technique utilized including caps, mask, sterile gowns, sterile gloves, large sterile drape, hand hygiene, and Betadine prep. The right groin was prepped and draped in standard fashion. The inferior aspect of the femoral head was marked radiographically. Under direct ultrasound guidance, the distal common femoral vein was accessed with a micropuncture needle. The micropuncture sheath was placed and hand injection of contrast was performed in a is lateral oblique to delineate appropriate puncture site for closure device usage. A Bentson wire was directed to the abdominal aorta. A 5 French, levin cm sheath was placed in the right common femoral vein. Over the Bentson wire, a 5 French C2 catheter was directed to the superior mesenteric artery ostium. Superior mesenteric angiogram was performed in the lateral projection. The superior mesenteric artery is widely patent with visualization of the middle colic artery arising anteriorly and coursing superiorly. Through the C2 catheter a J tipped direction microcatheter and single angle GT 0.018 inch wire were used to select the middle colic artery. Angiogram was performed from middle colic artery which demonstrated patency and no evidence  of active extravasation. Angiogram was repeated in multiple obliquities to fully evaluate for possible hemorrhage however none was seen. A branch of the middle colic artery supplying the transverse colon was then selected with the microcatheter and wire. Repeat angiogram was performed from this location, still without apparent hemorrhage. The catheters were removed. A 6 French Angio-Seal device was used to close the right common femoral artery. The patient tolerated the procedure well without immediate complication. IMPRESSION: No evidence of active hemorrhage in the middle colic artery territory as was visualized on CTA the day prior. [name], MD [facility] Electronically Signed   By: [name] MD   On: 04/27/2020 16:25   IR Angiogram Selective Each Additional Vessel  Result Date: 04/27/2020 INDICATION: 84 year old female with acute lower gastrointestinal hemorrhage visualized on CT a the day prior from the transverse colon. Prior mesenteric angiogram without evidence of active hemorrhage. Presents with repeat hematochezia and acute anemia. EXAM: 1. Ultrasound-guided right common femoral vein access 2. Selective catheterization of the superior mesenteric artery 3. Selective catheterization of the middle colic artery 4. Super selective catheterization of a distal branch of the middle colic artery 5. Mesenteric angiography MEDICATIONS: None ANESTHESIA/SEDATION: Moderate (conscious) sedation was employed during this procedure. A total of Versed 1 mg and Fentanyl 25 mcg was administered intravenously. Moderate Sedation Time: 40 minutes. The patient's level of consciousness and vital signs were monitored continuously by radiology nursing throughout the procedure under my direct supervision. CONTRAST:  36mL OMNIPAQUE IOHEXOL 300 MG/ML SOLN, 59mL OMNIPAQUE IOHEXOL 300 MG/ML SOLN, 35mL OMNIPAQUE IOHEXOL 300 MG/ML SOLN FLUOROSCOPY TIME:  Fluoroscopy  Time: 8 minutes 30 seconds (510 mGy). COMPLICATIONS: None immediate. PROCEDURE: Informed consent was obtained from the patient following explanation  of the procedure, risks, benefits and alternatives. The patient understands, agrees and consents for the procedure. All questions were addressed. A time out was performed prior to the initiation of the procedure. Maximal barrier sterile technique utilized including caps, mask, sterile gowns, sterile gloves, large sterile drape, hand hygiene, and Betadine prep. The right groin was prepped and draped in standard fashion. The inferior aspect of the femoral head was marked radiographically. Under direct ultrasound guidance, the distal common femoral vein was accessed with a micropuncture needle. The micropuncture sheath was placed and hand injection of contrast was performed in a is lateral oblique to delineate appropriate puncture site for closure device usage. A Bentson wire was directed to the abdominal aorta. A 5 French, levin cm sheath was placed in the right common femoral vein. Over the Bentson wire, a 5 French C2 catheter was directed to the superior mesenteric artery ostium. Superior mesenteric angiogram was performed in the lateral projection. The superior mesenteric artery is widely patent with visualization of the middle colic artery arising anteriorly and coursing superiorly. Through the C2 catheter a J tipped direction microcatheter and single angle GT 0.018 inch wire were used to select the middle colic artery. Angiogram was performed from middle colic artery which demonstrated patency and no evidence of active extravasation. Angiogram was repeated in multiple obliquities to fully evaluate for possible hemorrhage however none was seen. A branch of the middle colic artery supplying the transverse colon was then selected with the microcatheter and wire. Repeat angiogram was performed from this location, still without apparent hemorrhage. The catheters were  removed. A 6 French Angio-Seal device was used to close the right common femoral artery. The patient tolerated the procedure well without immediate complication. IMPRESSION: No evidence of active hemorrhage in the middle colic artery territory as was visualized on CTA the day prior. [name], MD [facility] Electronically Signed   By: [name] MD   On: 04/27/2020 16:25   IR Angiogram Selective Each Additional Vessel  Result Date: 04/27/2020 INDICATION: 84 year old female with acute lower gastrointestinal hemorrhage visualized on CT a the day prior from the transverse colon. Prior mesenteric angiogram without evidence of active hemorrhage. Presents with repeat hematochezia and acute anemia. EXAM: 1. Ultrasound-guided right common femoral vein access 2. Selective catheterization of the superior mesenteric artery 3. Selective catheterization of the middle colic artery 4. Super selective catheterization of a distal branch of the middle colic artery 5. Mesenteric angiography MEDICATIONS: None ANESTHESIA/SEDATION: Moderate (conscious) sedation was employed during this procedure. A total of Versed 1 mg and Fentanyl 25 mcg was administered intravenously. Moderate Sedation Time: 40 minutes. The patient's level of consciousness and vital signs were monitored continuously by radiology nursing throughout the procedure under my direct supervision. CONTRAST:  69mL OMNIPAQUE IOHEXOL 300 MG/ML SOLN, 27mL OMNIPAQUE IOHEXOL 300 MG/ML SOLN, 60mL OMNIPAQUE IOHEXOL 300 MG/ML SOLN FLUOROSCOPY TIME:  Fluoroscopy Time: 8 minutes 30 seconds (510 mGy). COMPLICATIONS: None immediate. PROCEDURE: Informed consent was obtained from the patient following explanation of the procedure, risks, benefits and alternatives. The patient understands, agrees and consents for the procedure. All questions were addressed. A time out was performed prior to the initiation of the procedure.  Maximal barrier sterile technique utilized including caps, mask, sterile gowns, sterile gloves, large sterile drape, hand hygiene, and Betadine prep. The right groin was prepped and draped in standard fashion. The inferior aspect of the femoral head was marked radiographically. Under direct ultrasound guidance, the distal common femoral  vein was accessed with a micropuncture needle. The micropuncture sheath was placed and hand injection of contrast was performed in a is lateral oblique to delineate appropriate puncture site for closure device usage. A Bentson wire was directed to the abdominal aorta. A 5 French, levin cm sheath was placed in the right common femoral vein. Over the Bentson wire, a 5 French C2 catheter was directed to the superior mesenteric artery ostium. Superior mesenteric angiogram was performed in the lateral projection. The superior mesenteric artery is widely patent with visualization of the middle colic artery arising anteriorly and coursing superiorly. Through the C2 catheter a J tipped direction microcatheter and single angle GT 0.018 inch wire were used to select the middle colic artery. Angiogram was performed from middle colic artery which demonstrated patency and no evidence of active extravasation. Angiogram was repeated in multiple obliquities to fully evaluate for possible hemorrhage however none was seen. A branch of the middle colic artery supplying the transverse colon was then selected with the microcatheter and wire. Repeat angiogram was performed from this location, still without apparent hemorrhage. The catheters were removed. A 6 French Angio-Seal device was used to close the right common femoral artery. The patient tolerated the procedure well without immediate complication. IMPRESSION: No evidence of active hemorrhage in the middle colic artery territory as was visualized on CTA the day prior. [name], MD [facility]  Radiology Electronically Signed   By: [name] MD   On: 04/27/2020 16:25   IR US Guide Vasc Access Right  Result Date: 04/27/2020 INDICATION: 84 year old female with acute lower gastrointestinal hemorrhage visualized on CT a the day prior from the transverse colon. Prior mesenteric angiogram without evidence of active hemorrhage. Presents with repeat hematochezia and acute anemia. EXAM: 1. Ultrasound-guided right common femoral vein access 2. Selective catheterization of the superior mesenteric artery 3. Selective catheterization of the middle colic artery 4. Super selective catheterization of a distal branch of the middle colic artery 5. Mesenteric angiography MEDICATIONS: None ANESTHESIA/SEDATION: Moderate (conscious) sedation was employed during this procedure. A total of Versed 1 mg and Fentanyl 25 mcg was administered intravenously. Moderate Sedation Time: 40 minutes. The patient's level of consciousness and vital signs were monitored continuously by radiology nursing throughout the procedure under my direct supervision. CONTRAST:  30mL OMNIPAQUE IOHEXOL 300 MG/ML SOLN, 86mL OMNIPAQUE IOHEXOL 300 MG/ML SOLN, 65mL OMNIPAQUE IOHEXOL 300 MG/ML SOLN FLUOROSCOPY TIME:  Fluoroscopy Time: 8 minutes 30 seconds (510 mGy). COMPLICATIONS: None immediate. PROCEDURE: Informed consent was obtained from the patient following explanation of the procedure, risks, benefits and alternatives. The patient understands, agrees and consents for the procedure. All questions were addressed. A time out was performed prior to the initiation of the procedure. Maximal barrier sterile technique utilized including caps, mask, sterile gowns, sterile gloves, large sterile drape, hand hygiene, and Betadine prep. The right groin was prepped and draped in standard fashion. The inferior aspect of the femoral head was marked radiographically. Under direct ultrasound guidance, the distal common femoral vein was accessed with a micropuncture  needle. The micropuncture sheath was placed and hand injection of contrast was performed in a is lateral oblique to delineate appropriate puncture site for closure device usage. A Bentson wire was directed to the abdominal aorta. A 5 French, levin cm sheath was placed in the right common femoral vein. Over the Bentson wire, a 5 French C2 catheter was directed to the superior mesenteric artery ostium. Superior mesenteric angiogram was performed in the  lateral projection. The superior mesenteric artery is widely patent with visualization of the middle colic artery arising anteriorly and coursing superiorly. Through the C2 catheter a J tipped direction microcatheter and single angle GT 0.018 inch wire were used to select the middle colic artery. Angiogram was performed from middle colic artery which demonstrated patency and no evidence of active extravasation. Angiogram was repeated in multiple obliquities to fully evaluate for possible hemorrhage however none was seen. A branch of the middle colic artery supplying the transverse colon was then selected with the microcatheter and wire. Repeat angiogram was performed from this location, still without apparent hemorrhage. The catheters were removed. A 6 French Angio-Seal device was used to close the right common femoral artery. The patient tolerated the procedure well without immediate complication. IMPRESSION: No evidence of active hemorrhage in the middle colic artery territory as was visualized on CTA the day prior. [name], MD [facility] Rad[facility] Electronically Signed   By: [name] MD   On: 04/27/2020 16:25   IR US Guide Vasc Access Right  Result Date: 04/27/2020 INDICATION: 84 year old with GI bleeding. CTA demonstrated focal hemorrhage in the distal transverse colon region. Patient was consented for mesenteric arteriogram and possible embolization. EXAM: MESENTERIC ARTERIOGRAM: SMA AND IMA  ULTRASOUND GUIDANCE FOR VASCULAR ACCESS MEDICATIONS: Ciprofloxacin 400 mg. The antibiotic was administered within 1 hour of the procedure ANESTHESIA/SEDATION: Moderate (conscious) sedation was employed during this procedure. A total of Versed 1.0 mg and Fentanyl 25 mcg was administered intravenously. Moderate Sedation Time: 98 minutes. The patient's level of consciousness and vital signs were monitored continuously by radiology nursing throughout the procedure under my direct supervision. CONTRAST:  200 mg Omnipaque 300 FLUOROSCOPY TIME:  Fluoroscopy Time: 25 minutes, 36 seconds, 970 COMPLICATIONS: None immediate. PROCEDURE: Informed consent was obtained from the patient following explanation of the procedure, risks, benefits and alternatives. The patient understands, agrees and consents for the procedure. All questions were addressed. A time out was performed prior to the initiation of the procedure. Maximal barrier sterile technique utilized including caps, mask, sterile gowns, sterile gloves, large sterile drape, hand hygiene, and Betadine prep. Fluoroscopy demonstrated that the urinary bladder was massively distended with contrast. Therefore, Foley catheter was performed prior to the procedure. Ciprofloxacin was given due to the F[place] catheter placement. Ultrasound demonstrated a patent right common femoral artery. Ultrasound image was saved for documentation. Right groin was prepped and draped in sterile fashion. Skin was anesthetized using 1% lidocaine. Small incision was made. Using ultrasound guidance, 21 gauge needle was directed into the right common femoral artery and a micropuncture dilator set was placed. Five French vascular sheath was placed. A Sos catheter was used to cannulate the SMA. SMA arteriograms were performed. Multiple attempts were made to cannulate the middle colic artery using a microcatheter but this was unsuccessful. Attention was directed to the IMA. It was difficult to cannulate the  IMA but it was eventually cannulated after the IMA was identified using a lateral projection and angiography. IMA injections were performed with attention to the left upper abdomen at the area of concern. No active bleeding was identified. The SMA was cannulated again using a RIM catheter. SMA arteriogram was performed. No active bleeding was identified. Again, attempted to cannulate the middle colic artery with a microcatheter but unable to cannulate the vessel. Five French catheter was removed over a Bentson wire. Angiography was performed through the right groin sheath. Right groin sheath was removed using an ExoSeal closure  device. Right groin hemostasis at the end of the procedure. Bandage placed over the puncture site. FINDINGS: SMA is widely patent. No evidence for active bleeding. Attention was directed to the left upper quadrant in the distal transverse colon region. Middle colic artery appears to come off anteriorly but this could never be successfully cannulated. IMA is patent. Attention was directed to the left colic artery distribution. No significant filling of the distal transverse colon from the IMA distribution. No evidence of active bleeding from the IMA. IMPRESSION: Angiography of the superior mesenteric artery and inferior mesenteric artery. No evidence for active bleeding. Electronically Signed   By: Markus Daft M.D.   On: 04/27/2020 08:02   CT Angio Abd/Pel w/ and/or w/o  Result Date: 04/26/2020 CLINICAL DATA:  84 year old female with history of diverticulosis and intestinal hemorrhage. EXAM: CTA ABDOMEN AND PELVIS WITHOUT AND WITH CONTRAST TECHNIQUE: Multidetector CT imaging of the abdomen and pelvis was performed using the standard protocol during bolus administration of intravenous contrast. Multiplanar reconstructed images and MIPs were obtained and reviewed to evaluate the vascular anatomy. CONTRAST:  121mL OMNIPAQUE IOHEXOL 350 MG/ML SOLN COMPARISON:  None. 02/14/2019 FINDINGS:  VASCULAR Aorta: Normal caliber and patent throughout. Scattered fibrofatty and calcific atherosclerotic changes most prominent in the distal segment. Celiac: Patent without evidence of aneurysm, dissection, vasculitis or significant stenosis. SMA: Patent without evidence of aneurysm, dissection, vasculitis or significant stenosis. Renals: Single bilateral renal arteries are patent without evidence of aneurysm, dissection, vasculitis, fibromuscular dysplasia or significant stenosis. IMA: Patent without evidence of aneurysm, dissection, vasculitis or significant stenosis. Inflow: Patent without evidence of aneurysm, dissection, vasculitis or significant stenosis. Proximal Outflow: Bilateral common femoral and visualized portions of the superficial and profunda femoral arteries are patent without evidence of aneurysm, dissection, vasculitis or significant stenosis. Veins: No obvious venous abnormality. Review of the MIP images confirms the above findings. NON-VASCULAR Lower chest: Bibasilar subsegmental atelectasis. Heart is normal in size. No pericardial effusion. Hepatobiliary: The liver is normal in size, contour, and attenuation. No focal lesion. The gallbladder is present with layering sludge versus vicarious contrast excretion. No pericholecystic fluid, gallbladder wall thickening, or cholelithiasis. No intra or extrahepatic biliary ductal dilation. Pancreas: Coarse calcification within the pancreatic head. No pancreatic ductal dilatation or surrounding inflammatory changes. Spleen: Normal in size without focal abnormality. Adrenals/Urinary Tract: Adrenal glands are unremarkable. Kidneys are normal, without renal calculi, focal lesion, or hydronephrosis. Bladder is unremarkable. Stomach/Bowel: Focus of active extravasation the distal transverse colon. Stomach is within normal limits. Appendix appears normal. Scattered colonic diverticula. No evidence of bowel wall thickening, distention, or inflammatory changes.  Lymphatic: No enlarged abdominal or pelvic lymph nodes. Reproductive: Status post hysterectomy. No adnexal masses. Other: No abdominal wall hernia or abnormality. No abdominopelvic ascites. Musculoskeletal: Multilevel degenerative changes of the visualized thoracolumbar spine. No acute osseous abnormality. IMPRESSION: VASCULAR 1. Focal active hemorrhage within the distal transverse colon. 2.  Aortic Atherosclerosis (ICD10-I70.0). NON-VASCULAR 1. Diverticulosis without evidence of diverticulitis. Recommend urgent Interventional Radiology consultation. These results were called by telephone at the time of interpretation on 04/26/2020 at 4:20 pm to provider Dr. Marylyn Ishihara, Who verbally acknowledged these results. [name], MD [facility] Electronically Signed   By: [name] MD   On: 04/26/2020 16:25      Subjective:  Patient seen and examined at the bedside this morning.  Hemodynamically stable for discharge today.  Discharge Exam: Vitals:   04/29/20 2209 04/30/20 0608  BP: (!) 141/71 120/68  Pulse: 92  88  Resp: 16 16  Temp: 98.4 F (36.9 C) 98.5 F (36.9 C)  SpO2: 96% 95%   Vitals:   04/29/20 0513 04/29/20 1324 04/29/20 2209 04/30/20 0608  BP: 132/78 129/70 (!) 141/71 120/68  Pulse: 89 89 92 88  Resp: 16 19 16 16   Temp: 97.9 F (36.6 C) 98 F (36.7 C) 98.4 F (36.9 C) 98.5 F (36.9 C)  TempSrc: Oral Oral Oral Oral  SpO2: 100% 100% 96% 95%  Weight:      Height:        General: Pt is alert, awake, not in acute distress Cardiovascular: RRR, S1/S2 +, no rubs, no gallops Respiratory: CTA bilaterally, no wheezing, no rhonchi Abdominal: Soft, NT, ND, bowel sounds + Extremities: no edema, no cyanosis    The results of significant diagnostics from this hospitalization (including imaging, microbiology, ancillary and laboratory) are listed below for reference.     Microbiology: Recent Results (from the past 240 hour(s))   Resp Panel by RT PCR (RSV, Flu A&B, Covid) - Nasopharyngeal Swab     Status: None   Collection Time: 04/26/20  8:03 PM   Specimen: Nasopharyngeal Swab  Result Value Ref Range Status   SARS Coronavirus 2 by RT PCR NEGATIVE NEGATIVE Final    Comment: (NOTE) SARS-CoV-2 target nucleic acids are NOT DETECTED.  The SARS-CoV-2 RNA is generally detectable in upper respiratoy specimens during the acute phase of infection. The lowest concentration of SARS-CoV-2 viral copies this assay can detect is 131 copies/mL. A negative result does not preclude SARS-Cov-2 infection and should not be used as the sole basis for treatment or other patient management decisions. A negative result may occur with  improper specimen collection/handling, submission of specimen other than nasopharyngeal swab, presence of viral mutation(s) within the areas targeted by this assay, and inadequate number of viral copies (<131 copies/mL). A negative result must be combined with clinical observations, patient history, and epidemiological information. The expected result is Negative.  Fact Sheet for Patients:  PinkCheek.be  Fact Sheet for Healthcare Providers:  GravelBags.it  This test is no t yet approved or cleared by the [place] FDA and  has been authorized for detection and/or diagnosis of SARS-CoV-2 by FDA under an Emergency Use Authorization (EUA). This EUA will remain  in effect (meaning this test can be used) for the duration of the COVID-19 declaration under Section 564(b)(1) of the Act, 21 U.S.C. section 360bbb-3(b)(1), unless the authorization is terminated or revoked sooner.     Influenza A by PCR NEGATIVE NEGATIVE Final   Influenza B by PCR NEGATIVE NEGATIVE Final    Comment: (NOTE) The Xpert Xpress SARS-CoV-2/FLU/RSV assay is intended as an aid in  the diagnosis of influenza from Nasopharyngeal swab specimens and  should not be used as a  sole basis for treatment. Nasal washings and  aspirates are unacceptable for Xpert Xpress SARS-CoV-2/FLU/RSV  testing.  Fact Sheet for Patients: PinkCheek.be  Fact Sheet for Healthcare Providers: GravelBags.it  This test is not yet approved or cleared by the [place] FDA and  has been authorized for detection and/or diagnosis of SARS-CoV-2 by  FDA under an Emergency Use Authorization (EUA). This EUA will remain  in effect (meaning this test can be used) for the duration of the  Covid-19 declaration under Section 564(b)(1) of the Act, 21  U.S.C. section 360bbb-3(b)(1), unless the authorization is  terminated or revoked.    Respiratory Syncytial Virus by PCR NEGATIVE NEGATIVE Final    Comment: (NOTE) Fact  Sheet for Patients: PinkCheek.be  Fact Sheet for Healthcare Providers: GravelBags.it  This test is not yet approved or cleared by the [place] FDA and  has been authorized for detection and/or diagnosis of SARS-CoV-2 by  FDA under an Emergency Use Authorization (EUA). This EUA will remain  in effect (meaning this test can be used) for the duration of the  COVID-19 declaration under Section 564(b)(1) of the Act, 21 U.S.C.  section 360bbb-3(b)(1), unless the authorization is terminated or  revoked. Performed at [facility], [place] [address] [place], [place] [address]      Labs: BNP (last 3 results) No results for input(s): BNP in the last 8760 hours. Basic Metabolic Panel: Recent Labs  Lab 04/26/20 1227 04/26/20 2058 04/27/20 0107 04/27/20 0827 04/28/20 0137 04/29/20 0553  NA 139  --  139  --  139 139  K 3.3*  --  3.0*  --  3.1* 3.5  CL 102  --  107  --  109 106  CO2 28  --  25  --  23 27  GLUCOSE 106*  --  107*  --  79 116*  BUN 13  --  10  --  11 11  CREATININE 0.64  --  0.54  --  0.59 0.55  CALCIUM 9.9  --  9.0  --  9.2  9.6  MG  --  1.9  --  2.0  --   --    Liver Function Tests: Recent Labs  Lab 04/26/20 1227 04/27/20 0107  AST 28 16  ALT 14 12  ALKPHOS 73 57  BILITOT 0.5 0.4  PROT 6.9 5.1*  ALBUMIN 4.0 3.0*   No results for input(s): LIPASE, AMYLASE in the last 168 hours. No results for input(s): AMMONIA in the last 168 hours. CBC: Recent Labs  Lab 04/26/20 1227 04/26/20 1227 04/26/20 2052 04/27/20 0111 04/27/20 2059 04/28/20 0137 04/28/20 0725 04/29/20 0553 04/30/20 0542  WBC 4.6  --  4.3  --   --  5.1  --  4.7 6.3  NEUTROABS  --   --   --   --   --  3.0  --  2.8 4.5  HGB 10.4*   < > 9.1*   < > 7.8* 7.9* 8.1* 8.1* 8.1*  HCT 29.5*   < > 26.8*   < > 22.4* 23.1* 23.6* 23.9* 23.4*  MCV 74.7*  --  74.9*  --   --  76.2*  --  77.6* 75.7*  PLT 212  --  213  --   --  190  --  184 180   < > = values in this interval not displayed.   Cardiac Enzymes: No results for input(s): CKTOTAL, CKMB, CKMBINDEX, TROPONINI in the last 168 hours. BNP: Invalid input(s): POCBNP CBG: No results for input(s): GLUCAP in the last 168 hours. D-Dimer No results for input(s): DDIMER in the last 72 hours. Hgb A1c No results for input(s): HGBA1C in the last 72 hours. Lipid Profile No results for input(s): CHOL, HDL, LDLCALC, TRIG, CHOLHDL, LDLDIRECT in the last 72 hours. Thyroid function studies No results for input(s): TSH, T4TOTAL, T3FREE, THYROIDAB in the last 72 hours.  Invalid input(s): FREET3 Anemia work up No results for input(s): VITAMINB12, FOLATE, FERRITIN, TIBC, IRON, RETICCTPCT in the last 72 hours. Urinalysis    Component Value Date/Time   COLORURINE STRAW (A) 04/14/2018 1521   APPEARANCEUR CLEAR 04/14/2018 1521   LABSPEC 1.015 04/14/2018 1521   PHURINE 7.0 04/14/2018 1521  GLUCOSEU NEGATIVE 04/14/2018 1521   HGBUR LARGE (A) 04/14/2018 1521   BILIRUBINUR NEGATIVE 04/14/2018 1521   BILIRUBINUR negative 11/08/2015 1258   KETONESUR NEGATIVE 04/14/2018 1521   PROTEINUR NEGATIVE 04/14/2018  1521   UROBILINOGEN 0.2 11/08/2015 1258   UROBILINOGEN 1.0 06/20/2009 1449   NITRITE NEGATIVE 04/14/2018 1521   LEUKOCYTESUR NEGATIVE 04/14/2018 1521   Sepsis Labs Invalid input(s): PROCALCITONIN,  WBC,  LACTICIDVEN Microbiology Recent Results (from the past 240 hour(s))  Resp Panel by RT PCR (RSV, Flu A&B, Covid) - Nasopharyngeal Swab     Status: None   Collection Time: 04/26/20  8:03 PM   Specimen: Nasopharyngeal Swab  Result Value Ref Range Status   SARS Coronavirus 2 by RT PCR NEGATIVE NEGATIVE Final    Comment: (NOTE) SARS-CoV-2 target nucleic acids are NOT DETECTED.  The SARS-CoV-2 RNA is generally detectable in upper respiratoy specimens during the acute phase of infection. The lowest concentration of SARS-CoV-2 viral copies this assay can detect is 131 copies/mL. A negative result does not preclude SARS-Cov-2 infection and should not be used as the sole basis for treatment or other patient management decisions. A negative result may occur with  improper specimen collection/handling, submission of specimen other than nasopharyngeal swab, presence of viral mutation(s) within the areas targeted by this assay, and inadequate number of viral copies (<131 copies/mL). A negative result must be combined with clinical observations, patient history, and epidemiological information. The expected result is Negative.  Fact Sheet for Patients:  PinkCheek.be  Fact Sheet for Healthcare Providers:  GravelBags.it  This test is no t yet approved or cleared by the [place] FDA and  has been authorized for detection and/or diagnosis of SARS-CoV-2 by FDA under an Emergency Use Authorization (EUA). This EUA will remain  in effect (meaning this test can be used) for the duration of the COVID-19 declaration under Section 564(b)(1) of the Act, 21 U.S.C. section 360bbb-3(b)(1), unless the authorization is terminated or revoked  sooner.     Influenza A by PCR NEGATIVE NEGATIVE Final   Influenza B by PCR NEGATIVE NEGATIVE Final    Comment: (NOTE) The Xpert Xpress SARS-CoV-2/FLU/RSV assay is intended as an aid in  the diagnosis of influenza from Nasopharyngeal swab specimens and  should not be used as a sole basis for treatment. Nasal washings and  aspirates are unacceptable for Xpert Xpress SARS-CoV-2/FLU/RSV  testing.  Fact Sheet for Patients: PinkCheek.be  Fact Sheet for Healthcare Providers: GravelBags.it  This test is not yet approved or cleared by the [place] FDA and  has been authorized for detection and/or diagnosis of SARS-CoV-2 by  FDA under an Emergency Use Authorization (EUA). This EUA will remain  in effect (meaning this test can be used) for the duration of the  Covid-19 declaration under Section 564(b)(1) of the Act, 21  U.S.C. section 360bbb-3(b)(1), unless the authorization is  terminated or revoked.    Respiratory Syncytial Virus by PCR NEGATIVE NEGATIVE Final    Comment: (NOTE) Fact Sheet for Patients: PinkCheek.be  Fact Sheet for Healthcare Providers: GravelBags.it  This test is not yet approved or cleared by the [place] FDA and  has been authorized for detection and/or diagnosis of SARS-CoV-2 by  FDA under an Emergency Use Authorization (EUA). This EUA will remain  in effect (meaning this test can be used) for the duration of the  COVID-19 declaration under Section 564(b)(1) of the Act, 21 U.S.C.  section 360bbb-3(b)(1), unless the authorization is terminated or  revoked. Performed at [facility]  [place] [address] [place], [place] [address]     Please note: You were cared for by a hospitalist during your hospital stay. Once you are discharged, your primary care physician will handle any further medical issues. Please note that NO  REFILLS for any discharge medications will be authorized once you are discharged, as it is imperative that you return to your primary care physician (or establish a relationship with a primary care physician if you do not have one) for your post hospital discharge needs so that they can reassess your need for medications and monitor your lab values.    Time coordinating discharge: 40 minutes  SIGNED:   Shelly Coss, MD  [facility] 04/30/2020, 10:55 AM Pager [phone]  If 7PM-7AM, please contact night-coverage [email or web address] Password [email or web address]

## 2020-04-30 NOTE — Care Management Important Message (Signed)
Important Message  Patient Details IM Letter given to the Patient Name: Latoya Bennett MRN: [id number] Date of Birth: 01-21-1936   Medicare Important Message Given:  Yes     Kerin Salen 04/30/2020, 10:31 AM

## 2020-05-01 ENCOUNTER — Other Ambulatory Visit: Payer: Self-pay | Admitting: Gastroenterology

## 2020-05-01 DIAGNOSIS — D500 Iron deficiency anemia secondary to blood loss (chronic): Secondary | ICD-10-CM

## 2020-05-02 NOTE — Telephone Encounter (Signed)
Placed Rx for the following and called patient to inform her that Rx is in:  FeroSul 325 (65 FE) MG tablet Generic drug: ferrous sulfate TAKE 2 TABLETS(650 MG) BY MOUTH EVERY OTHER DAY

## 2020-05-07 ENCOUNTER — Telehealth: Payer: Self-pay | Admitting: Emergency Medicine

## 2020-05-07 ENCOUNTER — Inpatient Hospital Stay: Payer: Medicare Other | Admitting: Registered Nurse

## 2020-05-07 NOTE — Telephone Encounter (Signed)
Spoke with pt to resch HOSP F/U that was canceled due to the provider she was seeing NP Orland Mustard was out of the office today. Pt did not want to resch appt for this Wed. 05/09/20. Pt stated she did not think she needed appt. Pt stated she might find another pratice and that she would call us back if she needed too. Please advise.

## 2020-06-20 ENCOUNTER — Telehealth: Payer: Self-pay | Admitting: Gastroenterology

## 2020-06-20 NOTE — Telephone Encounter (Signed)
Patient was wondering if there was a certain type of suppository to use during a diverticulitis flare up??

## 2020-06-20 NOTE — Telephone Encounter (Signed)
No, she does not have any symptoms at this time. She said she spoke with someone who recommended suppositories for diverticulitis symptoms. I didn't think there was such a thing. She reports not wanting to go to the ED for any further treatment if a flare up should accrue. She was advised to seek treatment if needed.

## 2020-06-20 NOTE — Telephone Encounter (Signed)
Pt is requesting a call back from a nurse in regards to her diverticulitis, pt did not disclose any further info

## 2020-06-20 NOTE — Telephone Encounter (Signed)
No, no suppositories for treatment of diverticulitis.  She has a history of recurrent diverticular bleed, but is she now complaining of diverticulitis symptoms?  If that is the case, depending on severity, can hopefully treat as outpatient with po antibiotics or could require ER evaluation if more severe symptoms, not tolerating p.o., etc.  Please inquire more from patient.  Thank you

## 2020-07-20 ENCOUNTER — Other Ambulatory Visit: Payer: Self-pay | Admitting: Gastroenterology

## 2020-07-20 DIAGNOSIS — D500 Iron deficiency anemia secondary to blood loss (chronic): Secondary | ICD-10-CM

## 2020-07-24 ENCOUNTER — Other Ambulatory Visit: Payer: Self-pay | Admitting: General Surgery

## 2020-09-30 ENCOUNTER — Other Ambulatory Visit: Payer: Self-pay | Admitting: Emergency Medicine

## 2020-09-30 DIAGNOSIS — I10 Essential (primary) hypertension: Secondary | ICD-10-CM

## 2020-09-30 NOTE — Telephone Encounter (Signed)
Per chart note on 04/30/20 this medication has been discontinued and is no longer on the patient's medication profile.

## 2020-10-01 ENCOUNTER — Telehealth: Payer: Self-pay | Admitting: Emergency Medicine

## 2020-10-01 DIAGNOSIS — I10 Essential (primary) hypertension: Secondary | ICD-10-CM

## 2020-10-02 ENCOUNTER — Telehealth: Payer: Self-pay | Admitting: *Deleted

## 2020-10-02 NOTE — Telephone Encounter (Signed)
   Please return call to patient to discuss medication

## 2020-10-02 NOTE — Telephone Encounter (Signed)
I called patient and when she answered the phone I tried to ID who/where I was calling from, she stated "I am not interested, I am not interested. Patient hung up the phone. I was unable to communicate reason for the call.

## 2020-10-03 ENCOUNTER — Telehealth: Payer: Self-pay | Admitting: *Deleted

## 2020-10-03 NOTE — Telephone Encounter (Signed)
1.Medication Requested: amLODipine (NORVASC) 10 MG tablet    2. Pharmacy (Name, Street, [place]): [facility] [address] [place]  3. On Med List: no   4. Last Visit with PCP: 11/01/19  5. Next visit date with PCP: n/a   Patient said she can be reached at [phone]   Agent: Please be advised that RX refills may take up to 3 business days. We ask that you follow-up with your pharmacy.

## 2020-10-03 NOTE — Telephone Encounter (Signed)
See telephone message, I spoke to the patient about refill for Amlodipine, which is not on current medicine list.

## 2020-10-03 NOTE — Telephone Encounter (Signed)
Spoke to patient concerning medication refill on Amlodipine, the Rx is not on her current list. I tried to explain to her it was discontinued 04/2020 by Dr Mitchel Honour. Patient states she has medication refilled recently and still taking it. I advised her I cannot refill medicine that is not listed. I advised her the doctor will be in the office on Monday. The patient will call back.

## 2020-10-08 NOTE — Telephone Encounter (Signed)
Patient called to get a refill on amLODipine (NORVASC) 10 MG tablet. It can be sent to [facility], [address]   She can be reached at [phone]

## 2020-10-10 ENCOUNTER — Other Ambulatory Visit: Payer: Self-pay | Admitting: Emergency Medicine

## 2020-10-10 DIAGNOSIS — I10 Essential (primary) hypertension: Secondary | ICD-10-CM

## 2020-10-10 MED ORDER — AMLODIPINE BESYLATE 10 MG PO TABS: 10.0000 mg | ORAL_TABLET | Freq: Every day | ORAL | 3 refills | Status: DC

## 2020-10-10 NOTE — Telephone Encounter (Signed)
New prescription for amlodipine sent to pharmacy of record.  Thanks.

## 2020-10-10 NOTE — Telephone Encounter (Signed)
Dr Kittie Plater, [place] is requesting a refill on Amlodipine 10 mg, I spoke to the patient on about 7 days ago. I explained to her the medication is not on her medication list, but she insisted she is taking it. The medication was discontinued last year. Please advise, thank you.

## 2020-10-19 ENCOUNTER — Other Ambulatory Visit: Payer: Self-pay | Admitting: Gastroenterology

## 2020-10-19 DIAGNOSIS — D500 Iron deficiency anemia secondary to blood loss (chronic): Secondary | ICD-10-CM

## 2020-10-24 DIAGNOSIS — M170 Bilateral primary osteoarthritis of knee: Secondary | ICD-10-CM | POA: Diagnosis not present

## 2020-10-24 DIAGNOSIS — G5603 Carpal tunnel syndrome, bilateral upper limbs: Secondary | ICD-10-CM | POA: Diagnosis not present

## 2020-11-13 ENCOUNTER — Ambulatory Visit: Payer: Medicare Other | Admitting: Emergency Medicine

## 2021-01-29 ENCOUNTER — Other Ambulatory Visit: Payer: Self-pay | Admitting: Gastroenterology

## 2021-01-29 DIAGNOSIS — D500 Iron deficiency anemia secondary to blood loss (chronic): Secondary | ICD-10-CM

## 2021-01-29 NOTE — Telephone Encounter (Signed)
Pharmacy is requesting for FEROSUL 325 (65 Fe) MG tablet for the patient. Pt. Last iron level was checked on 07/28/19. Please advise

## 2021-01-30 ENCOUNTER — Telehealth: Payer: Self-pay

## 2021-01-30 NOTE — Telephone Encounter (Signed)
Spoke with the patient. F/U appt. With Dr. Bryan Lemma is made for 02/27/21, and her iron tablet is sent to the pharmacy. Pt. Verbalized understanding.

## 2021-02-27 ENCOUNTER — Ambulatory Visit: Payer: Medicare Other | Admitting: Gastroenterology

## 2021-02-27 DIAGNOSIS — Z0000 Encounter for general adult medical examination without abnormal findings: Secondary | ICD-10-CM | POA: Diagnosis not present

## 2021-02-27 DIAGNOSIS — I499 Cardiac arrhythmia, unspecified: Secondary | ICD-10-CM | POA: Diagnosis not present

## 2021-02-27 DIAGNOSIS — I10 Essential (primary) hypertension: Secondary | ICD-10-CM | POA: Diagnosis not present

## 2021-02-27 DIAGNOSIS — M159 Polyosteoarthritis, unspecified: Secondary | ICD-10-CM | POA: Diagnosis not present

## 2021-03-26 DIAGNOSIS — M170 Bilateral primary osteoarthritis of knee: Secondary | ICD-10-CM | POA: Diagnosis not present

## 2021-03-26 DIAGNOSIS — R600 Localized edema: Secondary | ICD-10-CM | POA: Diagnosis not present

## 2021-03-27 DIAGNOSIS — R600 Localized edema: Secondary | ICD-10-CM | POA: Diagnosis not present

## 2021-04-01 DIAGNOSIS — Z135 Encounter for screening for eye and ear disorders: Secondary | ICD-10-CM | POA: Diagnosis not present

## 2021-04-01 DIAGNOSIS — M810 Age-related osteoporosis without current pathological fracture: Secondary | ICD-10-CM | POA: Diagnosis not present

## 2021-04-01 DIAGNOSIS — Z0000 Encounter for general adult medical examination without abnormal findings: Secondary | ICD-10-CM | POA: Diagnosis not present

## 2021-05-05 IMAGING — DX LEFT HUMERUS - 2+ VIEW
2 series · 2 of 2 positions shown · non-contrast
Comparison: No comparison studies available.

CLINICAL DATA: Fall 1 week ago.  Pain.

EXAM:
LEFT HUMERUS - 2+ VIEW

[humerus ap]
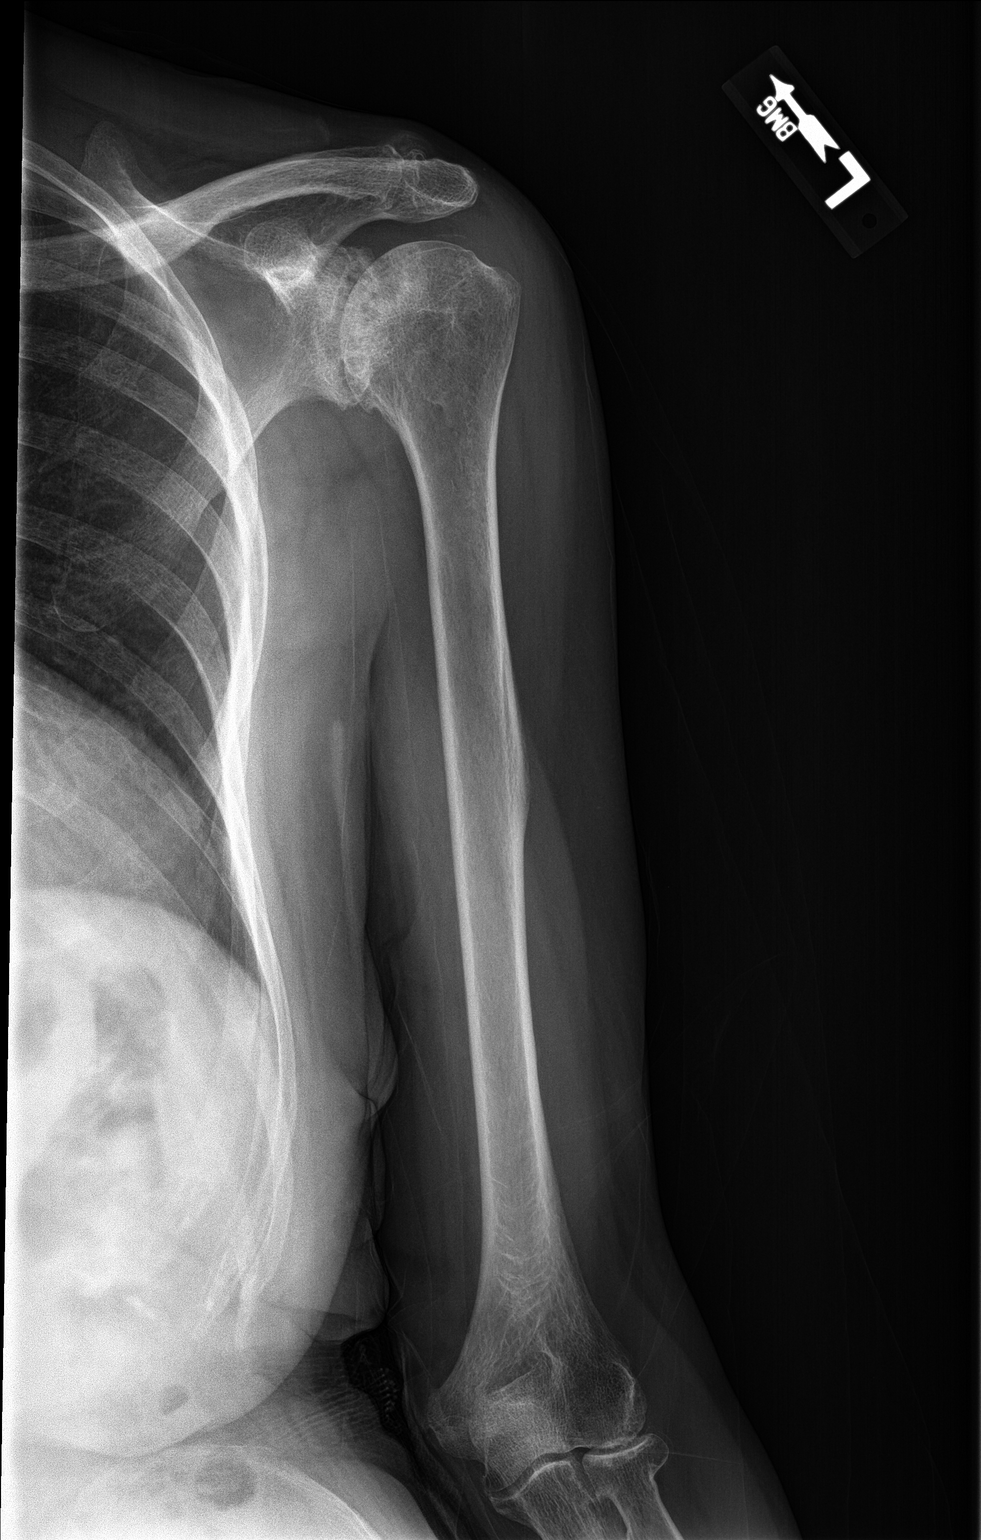

[humerus lat]
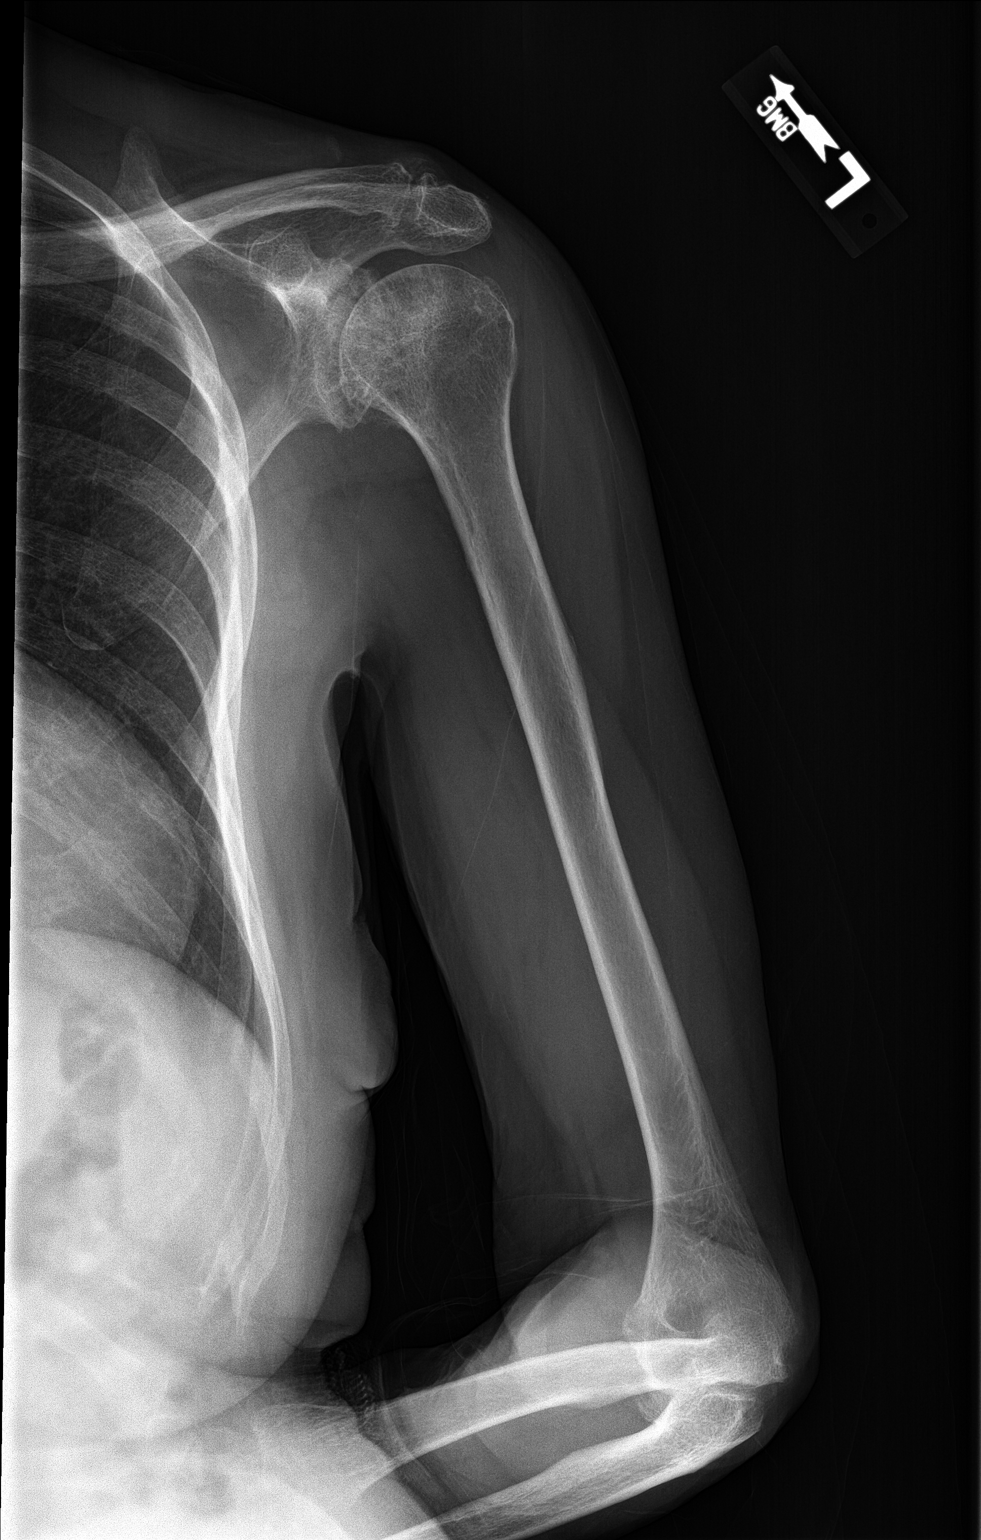

[2 of 2 positions shown; findings below may reference images not displayed]

FINDINGS: No fracture. No worrisome lytic or sclerotic osseous abnormality.
Degenerative changes are noted in the glenohumeral and
acromioclavicular joints.
IMPRESSION: Degenerative changes without acute bony abnormality.

## 2021-05-05 IMAGING — DX LEFT SHOULDER - 2+ VIEW
2 series · 2 of 2 positions shown · non-contrast
Comparison: 01/01/2018

CLINICAL DATA: Fall 2 days ago.  Pain.

EXAM:
LEFT SHOULDER - 2+ VIEW

[shoulder ap]
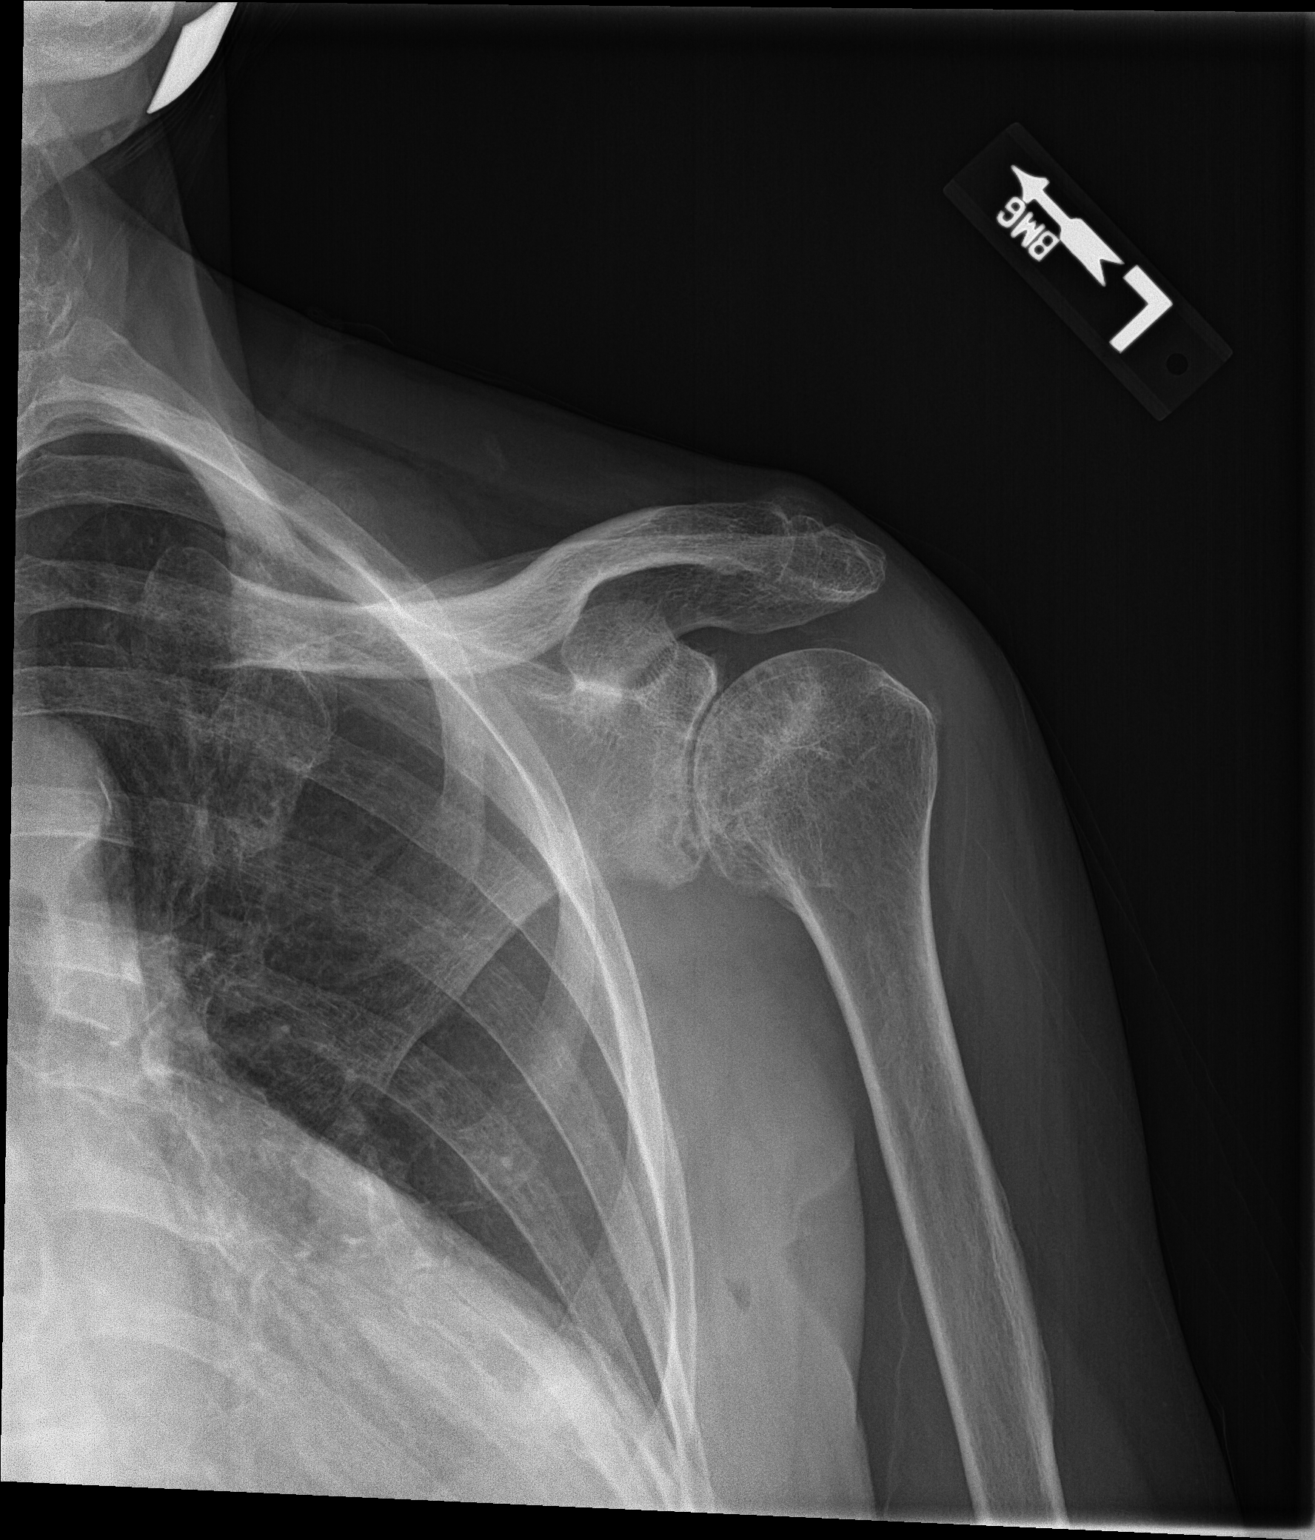

[shoulder y-view]
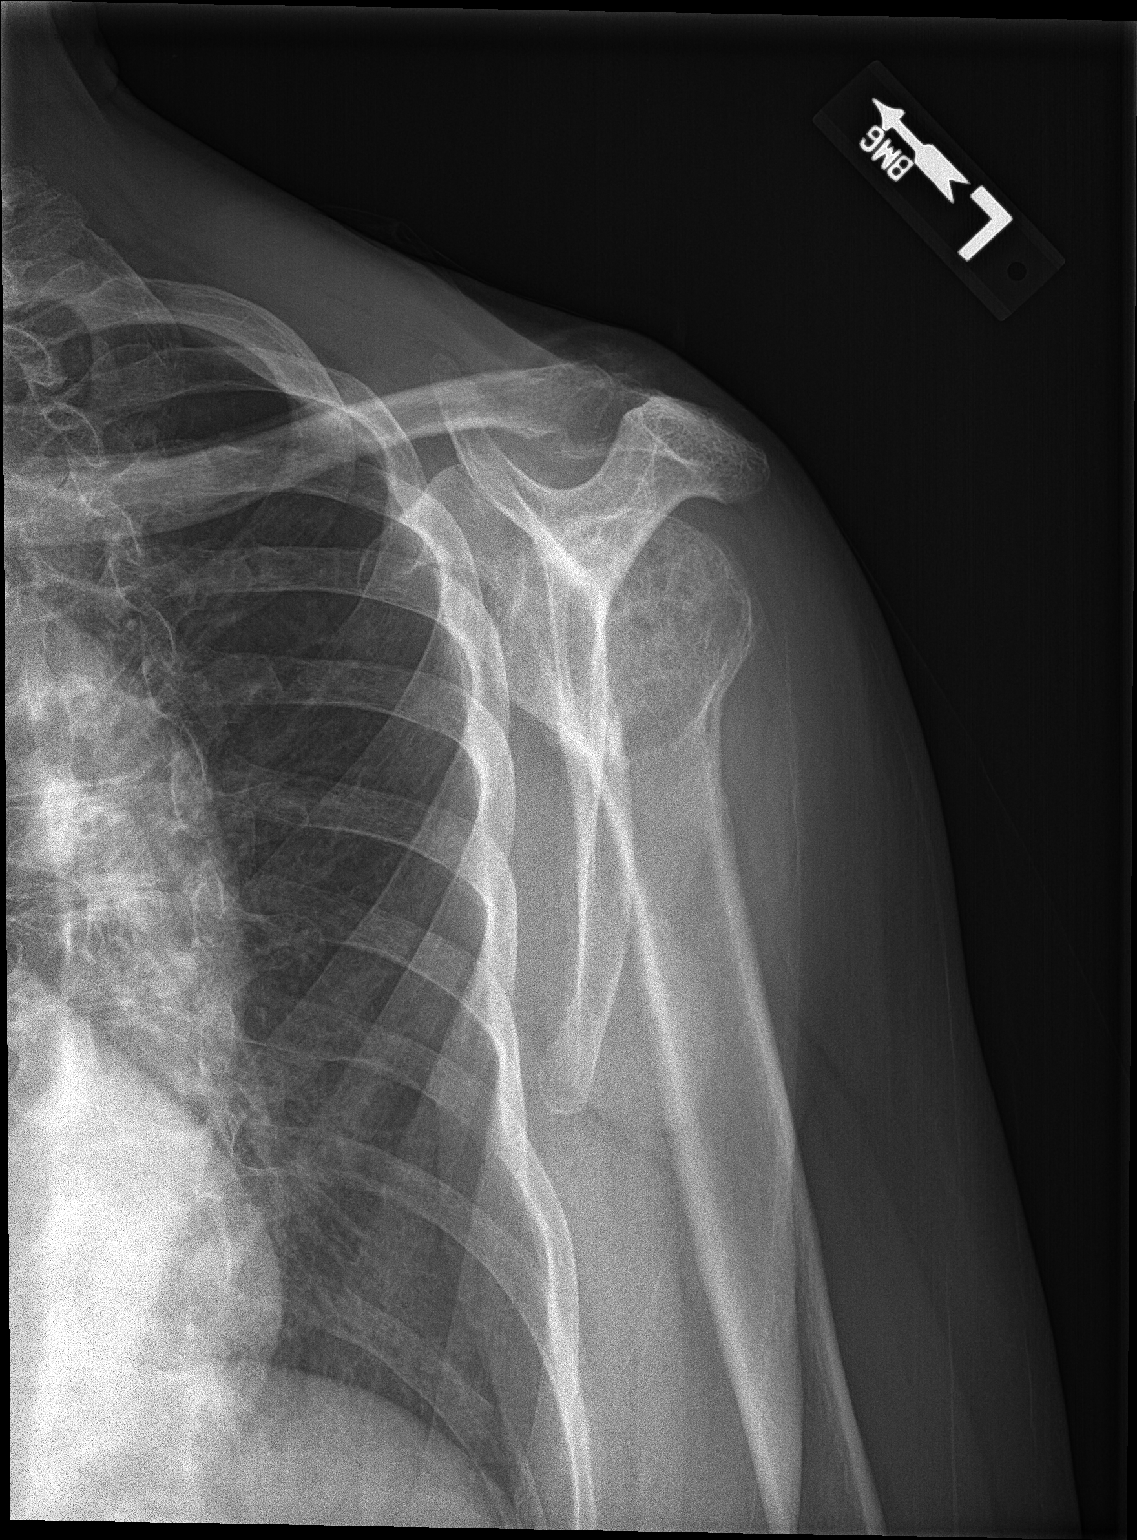

[2 of 2 positions shown; findings below may reference images not displayed]

FINDINGS: No fracture. No shoulder separation or dislocation. Degenerative
changes are seen in the glenohumeral joint and in the
acromioclavicular joint.
IMPRESSION: Degenerative changes without acute bony abnormality.

## 2021-05-11 ENCOUNTER — Other Ambulatory Visit: Payer: Self-pay | Admitting: Gastroenterology

## 2021-05-11 DIAGNOSIS — D500 Iron deficiency anemia secondary to blood loss (chronic): Secondary | ICD-10-CM

## 2021-06-02 ENCOUNTER — Emergency Department (HOSPITAL_COMMUNITY): Payer: Medicare Other

## 2021-06-02 ENCOUNTER — Inpatient Hospital Stay (HOSPITAL_COMMUNITY)
Admission: EM | Admit: 2021-06-02 | Discharge: 2021-06-05 | DRG: 378 | Disposition: A | Discharge status: Home Health | Payer: Medicare Other | Attending: Internal Medicine | Admitting: Internal Medicine

## 2021-06-02 ENCOUNTER — Other Ambulatory Visit: Payer: Self-pay

## 2021-06-02 DIAGNOSIS — K219 Gastro-esophageal reflux disease without esophagitis: Secondary | ICD-10-CM | POA: Diagnosis present

## 2021-06-02 DIAGNOSIS — K5792 Diverticulitis of intestine, part unspecified, without perforation or abscess without bleeding: Secondary | ICD-10-CM | POA: Diagnosis present

## 2021-06-02 DIAGNOSIS — K922 Gastrointestinal hemorrhage, unspecified: Secondary | ICD-10-CM

## 2021-06-02 DIAGNOSIS — D696 Thrombocytopenia, unspecified: Secondary | ICD-10-CM | POA: Diagnosis not present

## 2021-06-02 DIAGNOSIS — D62 Acute posthemorrhagic anemia: Secondary | ICD-10-CM | POA: Diagnosis not present

## 2021-06-02 DIAGNOSIS — Z79899 Other long term (current) drug therapy: Secondary | ICD-10-CM

## 2021-06-02 DIAGNOSIS — Z8249 Family history of ischemic heart disease and other diseases of the circulatory system: Secondary | ICD-10-CM

## 2021-06-02 DIAGNOSIS — Z20822 Contact with and (suspected) exposure to covid-19: Secondary | ICD-10-CM | POA: Diagnosis present

## 2021-06-02 DIAGNOSIS — G4489 Other headache syndrome: Secondary | ICD-10-CM | POA: Diagnosis not present

## 2021-06-02 DIAGNOSIS — R109 Unspecified abdominal pain: Secondary | ICD-10-CM | POA: Diagnosis not present

## 2021-06-02 DIAGNOSIS — E876 Hypokalemia: Secondary | ICD-10-CM | POA: Diagnosis present

## 2021-06-02 DIAGNOSIS — N281 Cyst of kidney, acquired: Secondary | ICD-10-CM | POA: Diagnosis not present

## 2021-06-02 DIAGNOSIS — I10 Essential (primary) hypertension: Secondary | ICD-10-CM | POA: Diagnosis not present

## 2021-06-02 DIAGNOSIS — E871 Hypo-osmolality and hyponatremia: Secondary | ICD-10-CM | POA: Diagnosis not present

## 2021-06-02 DIAGNOSIS — M1990 Unspecified osteoarthritis, unspecified site: Secondary | ICD-10-CM | POA: Diagnosis present

## 2021-06-02 DIAGNOSIS — K5733 Diverticulitis of large intestine without perforation or abscess with bleeding: Secondary | ICD-10-CM | POA: Diagnosis not present

## 2021-06-02 DIAGNOSIS — R933 Abnormal findings on diagnostic imaging of other parts of digestive tract: Secondary | ICD-10-CM

## 2021-06-02 DIAGNOSIS — K449 Diaphragmatic hernia without obstruction or gangrene: Secondary | ICD-10-CM | POA: Diagnosis not present

## 2021-06-02 DIAGNOSIS — K8020 Calculus of gallbladder without cholecystitis without obstruction: Secondary | ICD-10-CM | POA: Diagnosis not present

## 2021-06-02 DIAGNOSIS — K5730 Diverticulosis of large intestine without perforation or abscess without bleeding: Secondary | ICD-10-CM | POA: Diagnosis not present

## 2021-06-02 DIAGNOSIS — R58 Hemorrhage, not elsewhere classified: Secondary | ICD-10-CM | POA: Diagnosis not present

## 2021-06-02 DIAGNOSIS — K625 Hemorrhage of anus and rectum: Secondary | ICD-10-CM

## 2021-06-02 DIAGNOSIS — Z833 Family history of diabetes mellitus: Secondary | ICD-10-CM

## 2021-06-02 DIAGNOSIS — Z90710 Acquired absence of both cervix and uterus: Secondary | ICD-10-CM

## 2021-06-02 HISTORY — DX: Gastrointestinal hemorrhage, unspecified: K92.2

## 2021-06-02 LAB — CBC WITH DIFFERENTIAL/PLATELET
Abs Immature Granulocytes: 0.02 10*3/uL (ref 0.00–0.07)
Basophils Absolute: 0 10*3/uL (ref 0.0–0.1)
Basophils Relative: 0 %
Eosinophils Absolute: 0 10*3/uL (ref 0.0–0.5)
Eosinophils Relative: 1 %
HCT: 28.8 % — ABNORMAL LOW (ref 36.0–46.0)
Hemoglobin: 9.8 g/dL — ABNORMAL LOW (ref 12.0–15.0)
Immature Granulocytes: 0 %
Lymphocytes Relative: 20 %
Lymphs Abs: 1.2 10*3/uL (ref 0.7–4.0)
MCH: 25.5 pg — ABNORMAL LOW (ref 26.0–34.0)
MCHC: 34 g/dL (ref 30.0–36.0)
MCV: 75 fL — ABNORMAL LOW (ref 80.0–100.0)
Monocytes Absolute: 0.3 10*3/uL (ref 0.1–1.0)
Monocytes Relative: 5 %
Neutro Abs: 4.7 10*3/uL (ref 1.7–7.7)
Neutrophils Relative %: 74 %
Platelets: 180 10*3/uL (ref 150–400)
RBC: 3.84 MIL/uL — ABNORMAL LOW (ref 3.87–5.11)
RDW: 13.9 % (ref 11.5–15.5)
WBC: 6.3 10*3/uL (ref 4.0–10.5)
nRBC: 0 % (ref 0.0–0.2)

## 2021-06-02 LAB — COMPREHENSIVE METABOLIC PANEL
ALT: 17 U/L (ref 0–44)
AST: 20 U/L (ref 15–41)
Albumin: 3.1 g/dL — ABNORMAL LOW (ref 3.5–5.0)
Alkaline Phosphatase: 75 U/L (ref 38–126)
Anion gap: 8 (ref 5–15)
BUN: 16 mg/dL (ref 8–23)
CO2: 25 mmol/L (ref 22–32)
Calcium: 9.8 mg/dL (ref 8.9–10.3)
Chloride: 106 mmol/L (ref 98–111)
Creatinine, Ser: 0.78 mg/dL (ref 0.44–1.00)
GFR, Estimated: >60 mL/min (ref >60)
Glucose, Bld: 147 mg/dL — ABNORMAL HIGH (ref 70–99)
Potassium: 3.7 mmol/L (ref 3.5–5.1)
Sodium: 139 mmol/L (ref 135–145)
Total Bilirubin: 0.4 mg/dL (ref 0.3–1.2)
Total Protein: 5.8 g/dL — ABNORMAL LOW (ref 6.5–8.1)

## 2021-06-02 LAB — POC OCCULT BLOOD, ED: Fecal Occult Bld: POSITIVE — AB

## 2021-06-02 LAB — TYPE AND SCREEN
ABO/RH(D): O POS
Antibody Screen: NEGATIVE

## 2021-06-02 LAB — RESP PANEL BY RT-PCR (FLU A&B, COVID) ARPGX2
Influenza A by PCR: NEGATIVE
Influenza B by PCR: NEGATIVE
SARS Coronavirus 2 by RT PCR: NEGATIVE

## 2021-06-02 LAB — LIPASE, BLOOD: Lipase: 19 U/L (ref 11–51)

## 2021-06-02 MED ORDER — IOHEXOL 300 MG/ML  SOLN
80.0000 mL | Freq: Once | INTRAMUSCULAR | Status: AC | PRN
  Administered 2021-06-02: 21:00:00 80 mL via INTRAVENOUS

## 2021-06-02 MED ORDER — IOHEXOL 350 MG/ML SOLN
75.0000 mL | Freq: Once | INTRAVENOUS | Status: AC | PRN
  Administered 2021-06-02: 23:00:00 75 mL via INTRAVENOUS

## 2021-06-02 NOTE — ED Triage Notes (Signed)
Pt arrived via [facility] from home. Per EMS, pt requested for transport to ED with c/o rectal bleeding described as bright red blood that started at approx noon today. Pt also c/o weakness and dizziness w/no LOC. EMS further reports pt has hx of diverticulitis s/s are consistent with previous diverticulitis. Pt arrived to ED caox4 in no obvious distress denying any pain.

## 2021-06-02 NOTE — ED Notes (Signed)
Patient transported to CT 

## 2021-06-02 NOTE — ED Notes (Signed)
Patient back from CT.

## 2021-06-02 NOTE — H&P (Signed)
History and Physical    Latoya Bennett IRS:[id number] DOB: [birth year] DOA: 06/02/2021  PCP: Horald Pollen, MD  Patient coming from: Home via EMS  I have personally briefly reviewed patient's old medical records in [place]  Chief Complaint: Rectal bleeding  HPI: Latoya Bennett is a 85 y.o. female with medical history significant for diverticulosis and diverticulitis with history of lower GI bleed, hypertension who presents to the ED for evaluation of BRBPR.  Patient reports new onset of bright red blood per rectum beginning around noon on 11/27.  Initially it was bright red and then dark red.  She did become weak and somewhat dizzy afterwards.  She did not lose consciousness or fall.  She also has been having some vague left lower quadrant abdominal pain.  She says this is similar to when she was admitted for the same last year.  She denies any subjective fevers, chills, diaphoresis, chest pain, dyspnea, nausea, vomiting, dysuria.  She says she is not taking any blood thinners or NSAIDs.  She was wanting to leave AMA however after discussing her situation she is agreeable to stay in hospital now.  ED Course:  Initial vitals showed BP 130/78, pulse 92, RR 16, temp 98.8 F, SPO2 97% on room air.  Labs show hemoglobin 9.8 (last hemoglobin 14.9 on 02/27/2021), WBC 6.3, platelets 180,000, sodium 139, potassium 3.7, bicarb 25, BUN 16, creatinine 0.78, serum glucose 147, lipase 19.  COVID and influenza PCR negative.  FOBT is positive.  CT abdomen/pelvis with contrast shows scattered colonic diverticulosis with possible developing descending colon/proximal sigmoid colon mild acute diverticulitis.  Cholelithiasis without CT evidence of acute cholecystitis noted.  Chronic pancreatic appearance with calcification and main pancreatic duct dilatation also noted.  EDP discussed with on-call GI who recommended obtaining CTA abdomen/pelvis and if source of bleeding found  consider consultation with IR.  They will follow in AM.  The hospitalist service was consulted to admit for further evaluation and management.  Review of Systems: All systems reviewed and are negative except as documented in history of present illness above.   Past Medical History:  Diagnosis Date   Arthritis    Diverticulitis    GERD (gastroesophageal reflux disease)    GI bleed    Hypertension     Past Surgical History:  Procedure Laterality Date   ABDOMINAL HYSTERECTOMY     COLONOSCOPY Left 02/15/2019   Procedure: COLONOSCOPY;  Surgeon: Lavena Bullion, DO;  Location: [facility];  Service: Gastroenterology;  Laterality: Left;   ESOPHAGOGASTRODUODENOSCOPY (EGD) WITH PROPOFOL Left 04/18/2018   Procedure: ESOPHAGOGASTRODUODENOSCOPY (EGD) WITH PROPOFOL;  Surgeon: Arta Silence, MD;  Location: [facility];  Service: Endoscopy;  Laterality: Left;   IR ANGIOGRAM SELECTIVE EACH ADDITIONAL VESSEL  04/27/2020   IR ANGIOGRAM SELECTIVE EACH ADDITIONAL VESSEL  04/27/2020   IR ANGIOGRAM SELECTIVE EACH ADDITIONAL VESSEL  04/27/2020   IR ANGIOGRAM VISCERAL SELECTIVE  04/26/2020   IR ANGIOGRAM VISCERAL SELECTIVE  04/26/2020   IR ANGIOGRAM VISCERAL SELECTIVE  04/27/2020   IR US GUIDE VASC ACCESS RIGHT  04/26/2020   IR US GUIDE VASC ACCESS RIGHT  04/27/2020    Social History:  reports that she has never smoked. She has never used smokeless tobacco. She reports that she does not currently use alcohol. She reports that she does not use drugs.  No Known Allergies  Family History  Problem Relation Age of Onset   Hypertension Sister    Colon cancer Neg Hx    Esophageal cancer Neg Hx  Prior to Admission medications   Medication Sig Start Date End Date Taking? Authorizing Provider  amLODipine (NORVASC) 10 MG tablet Take 1 tablet (10 mg total) by mouth daily. 10/10/20  Yes Sagardia, Ines Bloomer, MD  diclofenac Sodium (VOLTAREN) 1 % GEL Apply 2 g topically 4 (four) times  daily. Patient taking differently: Apply 2 g topically 4 (four) times daily as needed (knee/leg pain). 04/30/20  Yes Adhikari, Tamsen Meek, MD  FEROSUL 325 (65 Fe) MG tablet TAKE 2 TABLETS(650 MG) BY MOUTH EVERY OTHER DAY Patient taking differently: 650 mg every other day. 01/30/21  Yes Cirigliano, Vito V, DO  lactose free nutrition (BOOST) LIQD Take 237 mLs by mouth daily.   Yes [provider]  Menthol, Topical Analgesic, (BIOFREEZE) 4 % GEL Apply 1 application topically daily.   Yes [provider]  Multiple Vitamin (MULTIVITAMIN WITH MINERALS) TABS tablet Take 1 tablet by mouth daily. 02/18/19  Yes Sheikh, [name], DO  Potassium 99 MG TABS Take 99 mg by mouth daily.   Yes [provider]  feeding supplement, ENSURE ENLIVE, (ENSURE ENLIVE) LIQD Take 237 mLs by mouth 2 (two) times daily between meals. Patient not taking: Reported on 04/26/2020 02/17/19   Kerney Elbe, DO    Physical Exam: Vitals:   06/02/21 1840 06/02/21 1841 06/02/21 2141 06/02/21 2200  BP: 130/78  137/71 106/62  Pulse: 92  99 83  Resp: 16  16 18   Temp: 98.8 F (37.1 C)     TempSrc: Oral     SpO2: 97%  100% 99%  Weight:  58.1 kg    Height:  4\' 11"  (1.499 m)     Constitutional: Elderly woman resting in bed, NAD, calm, comfortable Eyes: PERRL, lids and conjunctivae normal ENMT: Mucous membranes are moist. Posterior pharynx clear of any exudate or lesions.Normal dentition.  Neck: normal, supple, no masses. Respiratory: clear to auscultation bilaterally, no wheezing, no crackles. Normal respiratory effort. No accessory muscle use.  Cardiovascular: Regular rate and rhythm, no murmurs / rubs / gallops.  +1 bilateral lower extremity edema. 2+ pedal pulses. Abdomen: Mild LLQ tenderness, no masses palpated. No hepatosplenomegaly. Bowel sounds positive.  Musculoskeletal: no clubbing / cyanosis. No joint deformity upper and lower extremities. Good ROM, no contractures. Normal muscle tone.  Skin:  no rashes, lesions, ulcers. No induration Neurologic: CN 2-12 grossly intact. Sensation intact. Strength 5/5 in all 4.  Psychiatric: Normal judgment and insight. Alert and oriented x 3. Normal mood.   Labs on Admission: I have personally reviewed following labs and imaging studies  CBC: Recent Labs  Lab 06/02/21 2044  WBC 6.3  NEUTROABS 4.7  HGB 9.8*  HCT 28.8*  MCV 75.0*  PLT 970   Basic Metabolic Panel: Recent Labs  Lab 06/02/21 1906  NA 139  K 3.7  CL 106  CO2 25  GLUCOSE 147*  BUN 16  CREATININE 0.78  CALCIUM 9.8   GFR: Estimated Creatinine Clearance: 41.4 mL/min (by C-G formula based on SCr of 0.78 mg/dL). Liver Function Tests: Recent Labs  Lab 06/02/21 1906  AST 20  ALT 17  ALKPHOS 75  BILITOT 0.4  PROT 5.8*  ALBUMIN 3.1*   Recent Labs  Lab 06/02/21 1906  LIPASE 19   No results for input(s): AMMONIA in the last 168 hours. Coagulation Profile: No results for input(s): INR, PROTIME in the last 168 hours. Cardiac Enzymes: No results for input(s): CKTOTAL, CKMB, CKMBINDEX, TROPONINI in the last 168 hours. BNP (last 3 results) No results for  input(s): PROBNP in the last 8760 hours. HbA1C: No results for input(s): HGBA1C in the last 72 hours. CBG: No results for input(s): GLUCAP in the last 168 hours. Lipid Profile: No results for input(s): CHOL, HDL, LDLCALC, TRIG, CHOLHDL, LDLDIRECT in the last 72 hours. Thyroid Function Tests: No results for input(s): TSH, T4TOTAL, FREET4, T3FREE, THYROIDAB in the last 72 hours. Anemia Panel: No results for input(s): VITAMINB12, FOLATE, FERRITIN, TIBC, IRON, RETICCTPCT in the last 72 hours. Urine analysis:    Component Value Date/Time   COLORURINE STRAW (A) 04/14/2018 1521   APPEARANCEUR CLEAR 04/14/2018 1521   LABSPEC 1.015 04/14/2018 1521   PHURINE 7.0 04/14/2018 1521   GLUCOSEU NEGATIVE 04/14/2018 1521   HGBUR LARGE (A) 04/14/2018 1521   BILIRUBINUR NEGATIVE 04/14/2018 1521   BILIRUBINUR negative  11/08/2015 1258   KETONESUR NEGATIVE 04/14/2018 1521   PROTEINUR NEGATIVE 04/14/2018 1521   UROBILINOGEN 0.2 11/08/2015 1258   UROBILINOGEN 1.0 06/20/2009 1449   NITRITE NEGATIVE 04/14/2018 1521   LEUKOCYTESUR NEGATIVE 04/14/2018 1521    Radiological Exams on Admission: CT ABDOMEN PELVIS W CONTRAST  Result Date: 06/02/2021 CLINICAL DATA:  LLQ abdominal pain Left lower quadrant pain with rectal bleeding EXAM: CT ABDOMEN AND PELVIS WITH CONTRAST TECHNIQUE: Multidetector CT imaging of the abdomen and pelvis was performed using the standard protocol following bolus administration of intravenous contrast. CONTRAST:  39mL OMNIPAQUE IOHEXOL 300 MG/ML  SOLN COMPARISON:  CT abdomen pelvis 04/26/2020. MR abdomen 03/19/2019 FINDINGS: Lower chest: Coronary artery calcifications. Small left Bochdalek's hernia. Hepatobiliary: No focal liver abnormality. Layering hyperdensity consistent with tiny calcified gallstones within the gallbladder lumen. No gallbladder wall thickening or pericholecystic fluid. No biliary dilatation. Pancreas: Persistent 0.9 x 0.5cm calcified stone along the proximal pancreas with persistent associated chronic diffuse main pancreatic duct dilatation. Spleen: Normal in size without focal abnormality. Adrenals/Urinary Tract: No adrenal nodule bilaterally. Bilateral kidneys enhance symmetrically. Parapelvic renal cysts. Subcentimeter hypodensties are too small to characterize. No hydronephrosis. No hydroureter. The urinary bladder is unremarkable. On delayed imaging, there is no urothelial wall thickening and there are no filling defects in the opacified portions of the bilateral collecting systems or ureters. Stomach/Bowel: Stomach is within normal limits. No evidence of bowel wall thickening or dilatation. Under distension of the distal descending/proximal sigmoid colon with query mild bowel wall thickening and pericolonic stranding. Scattered colonic diverticulosis. Appendix appears normal.  Vascular/Lymphatic: No abdominal aorta or iliac aneurysm. Mild atherosclerotic plaque of the aorta and its branches. No abdominal, pelvic, or inguinal lymphadenopathy. Reproductive: Status post hysterectomy. No adnexal masses. Other: No intraperitoneal free fluid. No intraperitoneal free gas. No organized fluid collection. Musculoskeletal: No abdominal wall hernia or abnormality. No suspicious lytic or blastic osseous lesions. No acute displaced fracture. Multilevel severe degenerative changes of the spine. IMPRESSION: 1. Scattered colonic diverticulosis with possible developing distal descending colon/proximal sigmoid colon mild acute diverticulitis. Limited evaluation due to under distension of the colon. 2. Cholelithiasis no CT finding of acute cholecystitis. 3. Chronic pancreatic appearance with calcification and associated main pancreatic duct dilatation. 4. Aortic Atherosclerosis (ICD10-I70.0). Electronically Signed   By: Iven Finn M.D.   On: 06/02/2021 21:58   CT Angio Abd/Pel W and/or Wo Contrast  Result Date: 06/02/2021 CLINICAL DATA:  GI bleed. EXAM: CTA ABDOMEN AND PELVIS WITHOUT AND WITH CONTRAST TECHNIQUE: Multidetector CT imaging of the abdomen and pelvis was performed using the standard protocol during bolus administration of intravenous contrast. Multiplanar reconstructed images and MIPs were obtained and reviewed to evaluate the vascular anatomy. CONTRAST:  74mL OMNIPAQUE IOHEXOL 350 MG/ML SOLN, 2mL OMNIPAQUE IOHEXOL 300 MG/ML SOLN COMPARISON:  June 02, 2021 FINDINGS: VASCULAR Aorta: Mild calcification and atherosclerosis without aneurysm, dissection, vasculitis or significant stenosis. Celiac: Patent without evidence of aneurysm, dissection, vasculitis or significant stenosis. SMA: Patent without evidence of aneurysm, dissection, vasculitis or significant stenosis. Renals: Both renal arteries are patent without evidence of aneurysm, dissection, vasculitis, fibromuscular dysplasia or  significant stenosis. IMA: Patent without evidence of aneurysm, dissection, vasculitis or significant stenosis. Inflow: Moderate to marked severity calcification without evidence of aneurysm, dissection, vasculitis or significant stenosis. Proximal Outflow: Bilateral common femoral and visualized portions of the superficial and profunda femoral arteries are patent without evidence of aneurysm, dissection, vasculitis or significant stenosis. Veins: No obvious venous abnormality within the limitations of this arterial phase study. Review of the MIP images confirms the above findings. NON-VASCULAR Lower chest: No acute abnormality. Hepatobiliary: No focal liver abnormality is seen. Numerous tiny gallstones are seen within the lumen of an otherwise normal-appearing gallbladder. Pancreas: A 1.1 cm focal calcification is seen within the pancreatic head, within the expected region of the distal pancreatic duct. Subsequent pancreatic ductal dilatation is noted (approximately 6 mm). No inflammatory changes are identified. Spleen: Normal in size without focal abnormality. Adrenals/Urinary Tract: Adrenal glands are unremarkable. Kidneys are normal in size, without renal calculi or hydronephrosis. Bilateral parapelvic renal cysts are seen. Additional simple cysts are noted within the mid left kidney. Bladder is unremarkable. Stomach/Bowel: There is a small hiatal hernia. Appendix appears normal. No evidence of bowel dilatation. Noninflamed diverticula are noted throughout the large bowel. The mid to distal descending colon is moderately thickened and inflamed. This is stable in appearance when compared to the prior study Lymphatic: No abnormal abdominal or pelvic lymph nodes are identified. Reproductive: Status post hysterectomy. No adnexal masses. Other: No abdominal wall hernia or abnormality. No abdominopelvic ascites. Musculoskeletal: Marked severity multilevel degenerative changes seen throughout the lumbar spine.  IMPRESSION: 1. Moderate severity colitis involving the mid to distal descending colon. 2. Colonic diverticulosis. 3. Cholelithiasis without evidence of cholecystitis. 4. Stable 1.1 cm focal calcification within the pancreatic head, within the expected region of the distal pancreatic duct. 5. Small hiatal hernia. 6. Atherosclerosis of the abdominal aorta and arterial structures of the abdomen and pelvis, without evidence of aneurysmal dilatation, dissection or major vessel occlusion. Aortic Atherosclerosis (ICD10-I70.0). Electronically Signed   By: Virgina Norfolk M.D.   On: 06/02/2021 23:14    EKG: Personally reviewed. Sinus rhythm, LAD, PACs, nonspecific T wave changes leads III, aVF, V3-V6.  When compared to prior lateral T wave changes are new.  Assessment/Plan Principal Problem:   Acute GI bleeding Active Problems:   Essential hypertension   Diverticulitis   Candid Bovey is a 85 y.o. female with medical history significant for diverticulosis and diverticulitis with history of lower GI bleed, hypertension who is admitted for evaluation of acute lower GI bleeding.  Acute lower GI bleeding: Suspect recurrent diverticular bleed.  Had similar admission 1 year ago at which time CTA showed source of bleeding from transverse colon.  IR evaluated her but there was no active bleeding at time of their evaluation. Last colonoscopy 02/15/2019 showed diverticulosis throughout the colon. -GI to see in a.m. -CTA abdomen/pelvis obtained which shows moderate severity colitis mid to distal descending colon, no comment on active bleeding source -Repeat CBC in a.m., transfuse if needed -Keep n.p.o. after midnight -Gentle IV fluid hydration while n.p.o.  Descending colon/proximal sigmoid colon diverticulitis: Developing acute diverticulitis changes seen  on CT imaging.  Patient does report LLQ abdominal pain.  Will start on IV ceftriaxone and Flagyl for now.  Hypertension: Hold home amlodipine as she  is high risk for becoming hypotensive.  DVT prophylaxis: SCDs Code Status: Full code, confirmed on admission Family Communication: Discussed with patient, she has discussed with family Disposition Plan: From home, dispo pending clinical progress Consults called: Gastroenterology to see in a.m. Level of care: Progressive Admission status:  Status is: Observation  The patient remains OBS appropriate and will d/c before 2 midnights.  Zada Finders MD [facility]  If 7PM-7AM, please contact night-coverage [email or web address]  06/03/2021, 12:16 AM

## 2021-06-02 NOTE — ED Notes (Signed)
IV team at bedside 

## 2021-06-02 NOTE — ED Provider Notes (Signed)
[facility] EMERGENCY DEPARTMENT Provider Note   CSN: [id number] Arrival date & time: 06/02/21  1805     History Chief Complaint  Patient presents with   Rectal Bleeding    Latoya Bennett is a 85 y.o. female.  HPI Patient is an 85 year old female with a history of diverticulitis, GERD, GI bleed, hypertension, who presents to the emergency department due to rectal bleeding.  Patient states that she has been experiencing mild waxing and waning left lower quadrant pain for the past week.  Today she had 2 bloody bowel movements.  Reports associated lightheadedness.  No syncope.  Denies any chest pain, shortness of breath, fevers, chills, URI symptoms, urinary symptoms.  Denies being anticoagulated.    Past Medical History:  Diagnosis Date   Arthritis    Diverticulitis    GERD (gastroesophageal reflux disease)    GI bleed    Hypertension     Patient Active Problem List   Diagnosis Date Noted   Hypokalemia 02/12/2019   GERD (gastroesophageal reflux disease) 02/12/2019   Symptomatic anemia 02/12/2019   GIB (gastrointestinal bleeding) 02/12/2019   Diverticulosis of colon with hemorrhage    Acute posthemorrhagic anemia    Osteoarthritis of multiple joints 05/19/2018   History of gout 05/19/2018   Lower GI bleed 04/17/2018   GI bleed 04/17/2018   Chronic pain of both ankles 03/13/2017   Osteoarthritis of both ankles 03/13/2017   PAC (premature atrial contraction) 11/24/2016   Essential hypertension 11/24/2016   Primary osteoarthritis of left knee 11/24/2016   Tachycardia 11/24/2016    Past Surgical History:  Procedure Laterality Date   ABDOMINAL HYSTERECTOMY     COLONOSCOPY Left 02/15/2019   Procedure: COLONOSCOPY;  Surgeon: Lavena Bullion, DO;  Location: [facility];  Service: Gastroenterology;  Laterality: Left;   ESOPHAGOGASTRODUODENOSCOPY (EGD) WITH PROPOFOL Left 04/18/2018   Procedure: ESOPHAGOGASTRODUODENOSCOPY (EGD) WITH PROPOFOL;   Surgeon: Arta Silence, MD;  Location: [facility];  Service: Endoscopy;  Laterality: Left;   IR ANGIOGRAM SELECTIVE EACH ADDITIONAL VESSEL  04/27/2020   IR ANGIOGRAM SELECTIVE EACH ADDITIONAL VESSEL  04/27/2020   IR ANGIOGRAM SELECTIVE EACH ADDITIONAL VESSEL  04/27/2020   IR ANGIOGRAM VISCERAL SELECTIVE  04/26/2020   IR ANGIOGRAM VISCERAL SELECTIVE  04/26/2020   IR ANGIOGRAM VISCERAL SELECTIVE  04/27/2020   IR US GUIDE VASC ACCESS RIGHT  04/26/2020   IR US GUIDE VASC ACCESS RIGHT  04/27/2020     OB History   No obstetric history on file.     Family History  Problem Relation Age of Onset   Hypertension Sister    Colon cancer Neg Hx    Esophageal cancer Neg Hx     Social History   Tobacco Use   Smoking status: Never   Smokeless tobacco: Never  Vaping Use   Vaping Use: Never used  Substance Use Topics   Alcohol use: Not Currently    Alcohol/week: 0.0 standard drinks   Drug use: Never    Home Medications Prior to Admission medications   Medication Sig Start Date End Date Taking? Authorizing Provider  amLODipine (NORVASC) 10 MG tablet Take 1 tablet (10 mg total) by mouth daily. 10/10/20  Yes Sagardia, Ines Bloomer, MD  diclofenac Sodium (VOLTAREN) 1 % GEL Apply 2 g topically 4 (four) times daily. Patient taking differently: Apply 2 g topically 4 (four) times daily as needed (knee/leg pain). 04/30/20  Yes Adhikari, Tamsen Meek, MD  FEROSUL 325 (65 Fe) MG tablet TAKE 2 TABLETS(650 MG) BY MOUTH EVERY OTHER  DAY Patient taking differently: 650 mg every other day. 01/30/21  Yes Cirigliano, Vito V, DO  lactose free nutrition (BOOST) LIQD Take 237 mLs by mouth daily.   Yes [provider]  Menthol, Topical Analgesic, (BIOFREEZE) 4 % GEL Apply 1 application topically daily.   Yes [provider]  Multiple Vitamin (MULTIVITAMIN WITH MINERALS) TABS tablet Take 1 tablet by mouth daily. 02/18/19  Yes Sheikh, [name], DO  Potassium 99 MG TABS Take 99 mg by mouth daily.    Yes [provider]  feeding supplement, ENSURE ENLIVE, (ENSURE ENLIVE) LIQD Take 237 mLs by mouth 2 (two) times daily between meals. Patient not taking: Reported on 04/26/2020 02/17/19   Kerney Elbe, DO    Allergies    Patient has no known allergies.  Review of Systems   Review of Systems  All other systems reviewed and are negative. Ten systems reviewed and are negative for acute change, except as noted in the HPI.   Physical Exam Updated Vital Signs BP 137/71   Pulse 99   Temp 98.8 F (37.1 C) (Oral)   Resp 16 Comment: Simultaneous filing. User may not have seen previous data.  Ht 4\' 11"  (1.499 m)   Wt 58.1 kg   SpO2 100%   BMI 25.85 kg/m   Physical Exam Vitals and nursing note reviewed.  Constitutional:      General: She is not in acute distress.    Appearance: Normal appearance. She is not ill-appearing, toxic-appearing or diaphoretic.  HENT:     Head: Normocephalic and atraumatic.     Right Ear: External ear normal.     Left Ear: External ear normal.     Nose: Nose normal.     Mouth/Throat:     Mouth: Mucous membranes are moist.     Pharynx: Oropharynx is clear. No oropharyngeal exudate or posterior oropharyngeal erythema.  Eyes:     Extraocular Movements: Extraocular movements intact.  Cardiovascular:     Rate and Rhythm: Normal rate and regular rhythm.     Pulses: Normal pulses.     Heart sounds: Normal heart sounds. No murmur heard.   No friction rub. No gallop.  Pulmonary:     Effort: Pulmonary effort is normal. No respiratory distress.     Breath sounds: Normal breath sounds. No stridor. No wheezing, rhonchi or rales.  Abdominal:     General: Abdomen is flat.     Palpations: Abdomen is soft.     Tenderness: There is abdominal tenderness.     Comments: Abdomen is flat and soft.  Mild tenderness noted in the left lower quadrant.  Genitourinary:    Comments: Female nursing chaperone present.  Multiple bloodsoaked tissues noted overlying  the anus.  These were removed revealing a small amount of dried blood around the anus.  Moderate amount of frank hematochezia noted in the rectal vault.  No stool obtained.  No melena.  No visible or palpable hemorrhoids noted.  No tenderness appreciated throughout the exam. Musculoskeletal:        General: Normal range of motion.     Cervical back: Normal range of motion and neck supple. No tenderness.  Skin:    General: Skin is warm and dry.  Neurological:     General: No focal deficit present.     Mental Status: She is alert and oriented to person, place, and time.  Psychiatric:        Mood and Affect: Mood normal.  Behavior: Behavior normal.    ED Results / Procedures / Treatments   Labs (all labs ordered are listed, but only abnormal results are displayed) Labs Reviewed  COMPREHENSIVE METABOLIC PANEL - Abnormal; Notable for the following components:      Result Value   Glucose, Bld 147 (*)    Total Protein 5.8 (*)    Albumin 3.1 (*)    All other components within normal limits  CBC WITH DIFFERENTIAL/PLATELET - Abnormal; Notable for the following components:   RBC 3.84 (*)    Hemoglobin 9.8 (*)    HCT 28.8 (*)    MCV 75.0 (*)    MCH 25.5 (*)    All other components within normal limits  POC OCCULT BLOOD, ED - Abnormal; Notable for the following components:   Fecal Occult Bld POSITIVE (*)    All other components within normal limits  RESP PANEL BY RT-PCR (FLU A&B, COVID) ARPGX2  LIPASE, BLOOD  CBC WITH DIFFERENTIAL/PLATELET  TYPE AND SCREEN   EKG None  Radiology CT ABDOMEN PELVIS W CONTRAST  Result Date: 06/02/2021 CLINICAL DATA:  LLQ abdominal pain Left lower quadrant pain with rectal bleeding EXAM: CT ABDOMEN AND PELVIS WITH CONTRAST TECHNIQUE: Multidetector CT imaging of the abdomen and pelvis was performed using the standard protocol following bolus administration of intravenous contrast. CONTRAST:  26mL OMNIPAQUE IOHEXOL 300 MG/ML  SOLN COMPARISON:  CT  abdomen pelvis 04/26/2020. MR abdomen 03/19/2019 FINDINGS: Lower chest: Coronary artery calcifications. Small left Bochdalek's hernia. Hepatobiliary: No focal liver abnormality. Layering hyperdensity consistent with tiny calcified gallstones within the gallbladder lumen. No gallbladder wall thickening or pericholecystic fluid. No biliary dilatation. Pancreas: Persistent 0.9 x 0.5cm calcified stone along the proximal pancreas with persistent associated chronic diffuse main pancreatic duct dilatation. Spleen: Normal in size without focal abnormality. Adrenals/Urinary Tract: No adrenal nodule bilaterally. Bilateral kidneys enhance symmetrically. Parapelvic renal cysts. Subcentimeter hypodensties are too small to characterize. No hydronephrosis. No hydroureter. The urinary bladder is unremarkable. On delayed imaging, there is no urothelial wall thickening and there are no filling defects in the opacified portions of the bilateral collecting systems or ureters. Stomach/Bowel: Stomach is within normal limits. No evidence of bowel wall thickening or dilatation. Under distension of the distal descending/proximal sigmoid colon with query mild bowel wall thickening and pericolonic stranding. Scattered colonic diverticulosis. Appendix appears normal. Vascular/Lymphatic: No abdominal aorta or iliac aneurysm. Mild atherosclerotic plaque of the aorta and its branches. No abdominal, pelvic, or inguinal lymphadenopathy. Reproductive: Status post hysterectomy. No adnexal masses. Other: No intraperitoneal free fluid. No intraperitoneal free gas. No organized fluid collection. Musculoskeletal: No abdominal wall hernia or abnormality. No suspicious lytic or blastic osseous lesions. No acute displaced fracture. Multilevel severe degenerative changes of the spine. IMPRESSION: 1. Scattered colonic diverticulosis with possible developing distal descending colon/proximal sigmoid colon mild acute diverticulitis. Limited evaluation due to  under distension of the colon. 2. Cholelithiasis no CT finding of acute cholecystitis. 3. Chronic pancreatic appearance with calcification and associated main pancreatic duct dilatation. 4. Aortic Atherosclerosis (ICD10-I70.0). Electronically Signed   By: Iven Finn M.D.   On: 06/02/2021 21:58    Procedures Procedures   Medications Ordered in ED Medications  iohexol (OMNIPAQUE) 300 MG/ML solution 80 mL (80 mLs Intravenous Contrast Given 06/02/21 2121)  iohexol (OMNIPAQUE) 350 MG/ML injection 75 mL (75 mLs Intravenous Contrast Given 06/02/21 2247)    ED Course  I have reviewed the triage vital signs and the nursing notes.  Pertinent labs & imaging results that were available  during my care of the patient were reviewed by me and considered in my medical decision making (see chart for details).  Clinical Course as of 06/02/21 2311  Nancy Fetter Jun 02, 2021  2211 Fecal Occult Blood, POC(!): POSITIVE [LJ]  2211 Hemoglobin(!): 9.8 [LJ]  2221 Patient discussed with Dr. Ardis Hughs who is on-call with [facility] gastroenterology.  Agrees with admission.  Recommends that we also obtain a CTA of the abdomen/pelvis.  If active bleeding is identified recommends IR consultation during admission. [LJ]    Clinical Course User Index [LJ] Rayna Sexton, PA-C   MDM Rules/Calculators/A&P                          Pt is a 85 y.o. female who presents to the emergency department due to left lower quadrant abdominal pain as well as rectal bleeding.  Labs: CBC with a hemoglobin of 9.8, MCV of 75. CMP with a glucose of 147, total protein 5.8, albumin 3.1. Lipase of 19. Fecal occult blood test is positive. Respiratory panel is pending.  Imaging: CT scan of the abdomen/pelvis with contrast shows  IMPRESSION: 1. Scattered colonic diverticulosis with possible developing distal descending colon/proximal sigmoid colon mild acute diverticulitis. Limited evaluation due to under distension of the colon. 2. Cholelithiasis  no CT finding of acute cholecystitis. 3. Chronic pancreatic appearance with calcification and associated main pancreatic duct dilatation. 4. Aortic Atherosclerosis  CTA of the abdomen/pelvis is pending.  I, Rayna Sexton, PA-C, personally reviewed and evaluated these images and lab results as part of my medical decision-making.  Patient with gross hematochezia on my exam.  Per care everywhere, patient's last hemoglobin was 14.9 on August 24 of this year.  Hemoglobin today is 9.8.  Patient discussed with Dr. Ardis Hughs who is on-call with gastroenterology.  They will evaluate the patient tomorrow morning unless patient has any acute events overnight.  Recommend that we also obtain a CTA.  If there is an identifiable source of bleeding they recommend IR consultation as well.  Will discuss with the medicine team for admission and further management.  Note: Portions of this report may have been transcribed using voice recognition software. Every effort was made to ensure accuracy; however, inadvertent computerized transcription errors may be present.   Final Clinical Impression(s) / ED Diagnoses Final diagnoses:  Rectal bleeding  Diverticulitis   Rx / DC Orders ED Discharge Orders     None        Rayna Sexton, PA-C 06/02/21 2314    Luna Fuse, MD 06/05/21 1434

## 2021-06-02 NOTE — ED Notes (Addendum)
Patient stated she wants to leave AMA. Admitting team and ED RN aware.

## 2021-06-03 DIAGNOSIS — K922 Gastrointestinal hemorrhage, unspecified: Secondary | ICD-10-CM | POA: Diagnosis not present

## 2021-06-03 DIAGNOSIS — E871 Hypo-osmolality and hyponatremia: Secondary | ICD-10-CM | POA: Diagnosis present

## 2021-06-03 DIAGNOSIS — Z20822 Contact with and (suspected) exposure to covid-19: Secondary | ICD-10-CM | POA: Diagnosis present

## 2021-06-03 DIAGNOSIS — K5731 Diverticulosis of large intestine without perforation or abscess with bleeding: Secondary | ICD-10-CM | POA: Diagnosis not present

## 2021-06-03 DIAGNOSIS — Z8249 Family history of ischemic heart disease and other diseases of the circulatory system: Secondary | ICD-10-CM | POA: Diagnosis not present

## 2021-06-03 DIAGNOSIS — K5792 Diverticulitis of intestine, part unspecified, without perforation or abscess without bleeding: Secondary | ICD-10-CM | POA: Diagnosis not present

## 2021-06-03 DIAGNOSIS — D62 Acute posthemorrhagic anemia: Secondary | ICD-10-CM | POA: Diagnosis present

## 2021-06-03 DIAGNOSIS — Z90710 Acquired absence of both cervix and uterus: Secondary | ICD-10-CM | POA: Diagnosis not present

## 2021-06-03 DIAGNOSIS — E876 Hypokalemia: Secondary | ICD-10-CM | POA: Diagnosis present

## 2021-06-03 DIAGNOSIS — R933 Abnormal findings on diagnostic imaging of other parts of digestive tract: Secondary | ICD-10-CM | POA: Diagnosis not present

## 2021-06-03 DIAGNOSIS — Z79899 Other long term (current) drug therapy: Secondary | ICD-10-CM | POA: Diagnosis not present

## 2021-06-03 DIAGNOSIS — Z833 Family history of diabetes mellitus: Secondary | ICD-10-CM | POA: Diagnosis not present

## 2021-06-03 DIAGNOSIS — D696 Thrombocytopenia, unspecified: Secondary | ICD-10-CM | POA: Diagnosis present

## 2021-06-03 DIAGNOSIS — I10 Essential (primary) hypertension: Secondary | ICD-10-CM | POA: Diagnosis present

## 2021-06-03 DIAGNOSIS — M1990 Unspecified osteoarthritis, unspecified site: Secondary | ICD-10-CM | POA: Diagnosis present

## 2021-06-03 DIAGNOSIS — K219 Gastro-esophageal reflux disease without esophagitis: Secondary | ICD-10-CM | POA: Diagnosis present

## 2021-06-03 DIAGNOSIS — K5733 Diverticulitis of large intestine without perforation or abscess with bleeding: Secondary | ICD-10-CM | POA: Diagnosis present

## 2021-06-03 LAB — CBC
HCT: 24.3 % — ABNORMAL LOW (ref 36.0–46.0)
HCT: 23.8 % — ABNORMAL LOW (ref 36.0–46.0)
HCT: 24.3 % — ABNORMAL LOW (ref 36.0–46.0)
Hemoglobin: 8.1 g/dL — ABNORMAL LOW (ref 12.0–15.0)
Hemoglobin: 7.9 g/dL — ABNORMAL LOW (ref 12.0–15.0)
Hemoglobin: 8.4 g/dL — ABNORMAL LOW (ref 12.0–15.0)
MCH: 24.8 pg — ABNORMAL LOW (ref 26.0–34.0)
MCH: 24.8 pg — ABNORMAL LOW (ref 26.0–34.0)
MCH: 25.6 pg — ABNORMAL LOW (ref 26.0–34.0)
MCHC: 33.3 g/dL (ref 30.0–36.0)
MCHC: 33.2 g/dL (ref 30.0–36.0)
MCHC: 34.6 g/dL (ref 30.0–36.0)
MCV: 74.5 fL — ABNORMAL LOW (ref 80.0–100.0)
MCV: 74.8 fL — ABNORMAL LOW (ref 80.0–100.0)
MCV: 74.1 fL — ABNORMAL LOW (ref 80.0–100.0)
Platelets: 150 10*3/uL (ref 150–400)
Platelets: 153 10*3/uL (ref 150–400)
Platelets: 153 10*3/uL (ref 150–400)
RBC: 3.26 MIL/uL — ABNORMAL LOW (ref 3.87–5.11)
RBC: 3.18 MIL/uL — ABNORMAL LOW (ref 3.87–5.11)
RBC: 3.28 MIL/uL — ABNORMAL LOW (ref 3.87–5.11)
RDW: 14 % (ref 11.5–15.5)
RDW: 13.9 % (ref 11.5–15.5)
RDW: 14 % (ref 11.5–15.5)
WBC: 4.3 10*3/uL (ref 4.0–10.5)
WBC: 4.3 10*3/uL (ref 4.0–10.5)
WBC: 4.1 10*3/uL (ref 4.0–10.5)
nRBC: 0 % (ref 0.0–0.2)
nRBC: 0 % (ref 0.0–0.2)
nRBC: 0 % (ref 0.0–0.2)

## 2021-06-03 LAB — COMPREHENSIVE METABOLIC PANEL
ALT: 15 U/L (ref 0–44)
AST: 17 U/L (ref 15–41)
Albumin: 2.4 g/dL — ABNORMAL LOW (ref 3.5–5.0)
Alkaline Phosphatase: 56 U/L (ref 38–126)
Anion gap: 4 — ABNORMAL LOW (ref 5–15)
BUN: 13 mg/dL (ref 8–23)
CO2: 27 mmol/L (ref 22–32)
Calcium: 8.8 mg/dL — ABNORMAL LOW (ref 8.9–10.3)
Chloride: 103 mmol/L (ref 98–111)
Creatinine, Ser: 0.68 mg/dL (ref 0.44–1.00)
GFR, Estimated: >60 mL/min (ref >60)
Glucose, Bld: 118 mg/dL — ABNORMAL HIGH (ref 70–99)
Potassium: 3.3 mmol/L — ABNORMAL LOW (ref 3.5–5.1)
Sodium: 134 mmol/L — ABNORMAL LOW (ref 135–145)
Total Bilirubin: 0.1 mg/dL — ABNORMAL LOW (ref 0.3–1.2)
Total Protein: 4.4 g/dL — ABNORMAL LOW (ref 6.5–8.1)

## 2021-06-03 LAB — PROTIME-INR
INR: 1.2 (ref 0.8–1.2)
Prothrombin Time: 14.9 seconds (ref 11.4–15.2)

## 2021-06-03 LAB — IRON AND TIBC
Iron: 105 ug/dL (ref 28–170)
Saturation Ratios: 50 % — ABNORMAL HIGH (ref 10.4–31.8)
TIBC: 211 ug/dL — ABNORMAL LOW (ref 250–450)
UIBC: 106 ug/dL

## 2021-06-03 LAB — FERRITIN: Ferritin: 92 ng/mL (ref 11–307)

## 2021-06-03 MED ORDER — ACETAMINOPHEN 650 MG RE SUPP: 650.0000 mg | Freq: Four times a day (QID) | RECTAL | Status: DC | PRN

## 2021-06-03 MED ORDER — LACTATED RINGERS IV SOLN: INTRAVENOUS | Status: AC

## 2021-06-03 MED ORDER — POTASSIUM CHLORIDE CRYS ER 20 MEQ PO TBCR
40.0000 meq | EXTENDED_RELEASE_TABLET | Freq: Once | ORAL | Status: AC
  Administered 2021-06-03: 08:00:00 40 meq via ORAL
  Filled 2021-06-03: qty 2

## 2021-06-03 MED ORDER — SODIUM CHLORIDE 0.9 % IV SOLN
2.0000 g | Freq: Once | INTRAVENOUS | Status: AC
  Administered 2021-06-03: 01:00:00 2 g via INTRAVENOUS
  Filled 2021-06-03: qty 20

## 2021-06-03 MED ORDER — SODIUM CHLORIDE 0.9 % IV SOLN
2.0000 g | INTRAVENOUS | Status: DC
  Administered 2021-06-03 – 2021-06-04 (×2): 2 g via INTRAVENOUS
  Filled 2021-06-03 (×3): qty 20

## 2021-06-03 MED ORDER — SODIUM CHLORIDE 0.9% FLUSH
3.0000 mL | Freq: Two times a day (BID) | INTRAVENOUS | Status: DC
  Administered 2021-06-03 – 2021-06-04 (×5): 3 mL via INTRAVENOUS

## 2021-06-03 MED ORDER — METRONIDAZOLE 500 MG/100ML IV SOLN
500.0000 mg | Freq: Three times a day (TID) | INTRAVENOUS | Status: DC
  Administered 2021-06-03 – 2021-06-05 (×6): 500 mg via INTRAVENOUS
  Filled 2021-06-03 (×7): qty 100

## 2021-06-03 MED ORDER — ACETAMINOPHEN 325 MG PO TABS: 650.0000 mg | ORAL_TABLET | Freq: Four times a day (QID) | ORAL | Status: DC | PRN

## 2021-06-03 MED ORDER — METRONIDAZOLE 500 MG/100ML IV SOLN
500.0000 mg | Freq: Once | INTRAVENOUS | Status: AC
  Administered 2021-06-03: 02:00:00 500 mg via INTRAVENOUS
  Filled 2021-06-03: qty 100

## 2021-06-03 MED ORDER — ONDANSETRON HCL 4 MG PO TABS: 4.0000 mg | ORAL_TABLET | Freq: Four times a day (QID) | ORAL | Status: DC | PRN

## 2021-06-03 MED ORDER — ONDANSETRON HCL 4 MG/2ML IJ SOLN: 4.0000 mg | Freq: Four times a day (QID) | INTRAMUSCULAR | Status: DC | PRN

## 2021-06-03 NOTE — Progress Notes (Signed)
PROGRESS NOTE        PATIENT DETAILS Name: Latoya Bennett Age: 85 y.o. Sex: female Date of Birth: [birth year] Admit Date: 06/02/2021 Admitting Physician Lenore Cordia, MD [id number], Ines Bloomer, MD  Brief Narrative: Patient is a 85 y.o. female with history of prior diverticular bleeding, HTN-presented with painless hematochezia.  Subjective: Lying comfortably in bed-denies any chest pain or shortness of breath.  Denies any abdominal pain.  Has not had any further episodes of hematochezia since she  arrived in 4 E. earlier this morning.  Objective: Vitals: Blood pressure (!) 109/52, pulse 89, temperature 97.7 F (36.5 C), temperature source Oral, resp. rate 16, height 4\' 11"  (1.499 m), weight 58.1 kg, SpO2 99 %.   Exam: Gen Exam:Alert awake-not in any distress HEENT:atraumatic, normocephalic Chest: B/L clear to auscultation anteriorly CVS:S1S2 regular Abdomen:soft non tender, non distended Extremities:no edema Neurology: Non focal Skin: no rash  Pertinent Labs/Radiology: Recent Labs  Lab 06/02/21 1906 06/02/21 2044 06/03/21 0343 06/03/21 1016  WBC  --    < > 4.3 4.3  HGB  --    < > 8.1* 7.9*  PLT  --    < > 150 153  NA 139  --  134*  --   K 3.7  --  3.3*  --   CREATININE 0.78  --  0.68  --   AST 20  --  17  --   ALT 17  --  15  --   ALKPHOS 75  --  56  --   BILITOT 0.4  --  0.1*  --    < > = values in this interval not displayed.      Assessment/Plan: Lower GI bleeding due to diverticulosis and acute blood loss anemia: No further episodes of hematochezia this morning-hemoglobin still downtrending-plan is to continue supportive care and transfuse as needed.  CTA abdomen was negative for any active extravasation.  Await input from GI.  ?  Diverticulitis: Has CT evidence of diverticulitis-however has no abdominal pain-abdomen is very benign on exam.  Continue empiric antimicrobial therapy for a few days.  HTN: BP  stable-amlodipine on hold  Hypokalemia: Replete and recheck.  BMI Estimated body mass index is 25.85 kg/m as calculated from the following:   Height as of this encounter: 4\' 11"  (1.499 m).   Weight as of this encounter: 58.1 kg.    Procedures: None Consults: GI DVT Prophylaxis: SCD's Code Status:Full code  Family Communication: None at bedside  Time spent: 35 minutes-Greater than 50% of this time was spent in counseling, explanation of diagnosis, planning of further management, and coordination of care.   Disposition Plan: Status is: Observation  The patient will require care spanning > 2 midnights and should be moved to inpatient because: Diverticular bleeding-needs inpatient monitoring to ensure stability-May need PRBC transfusion as well.    Diet: Diet Order             Diet clear liquid Room service appropriate? Yes; Fluid consistency: Thin  Diet effective now                     Antimicrobial agents: Anti-infectives (From admission, onward)    Start     Dose/Rate Route Frequency Ordered Stop   06/03/21 2200  cefTRIAXone (ROCEPHIN) 2 g in sodium chloride 0.9 % 100 mL IVPB  2 g 200 mL/hr over 30 Minutes Intravenous Every 24 hours 06/03/21 0011     06/03/21 0800  metroNIDAZOLE (FLAGYL) IVPB 500 mg        500 mg 100 mL/hr over 60 Minutes Intravenous Every 8 hours 06/03/21 0011     06/03/21 0030  cefTRIAXone (ROCEPHIN) 2 g in sodium chloride 0.9 % 100 mL IVPB        2 g 200 mL/hr over 30 Minutes Intravenous  Once 06/03/21 0015 06/03/21 0203   06/03/21 0030  metroNIDAZOLE (FLAGYL) IVPB 500 mg        500 mg 100 mL/hr over 60 Minutes Intravenous  Once 06/03/21 0015 06/03/21 0328        MEDICATIONS: Scheduled Meds:  sodium chloride flush  3 mL Intravenous Q12H   Continuous Infusions:  cefTRIAXone (ROCEPHIN)  IV     metronidazole 500 mg (06/03/21 0807)   PRN Meds:.acetaminophen **OR** acetaminophen, ondansetron **OR** ondansetron (ZOFRAN)  IV   I have personally reviewed following labs and imaging studies  LABORATORY DATA: CBC: Recent Labs  Lab 06/02/21 2044 06/03/21 0343 06/03/21 1016  WBC 6.3 4.3 4.3  NEUTROABS 4.7  --   --   HGB 9.8* 8.1* 7.9*  HCT 28.8* 24.3* 23.8*  MCV 75.0* 74.5* 74.8*  PLT 180 150 009    Basic Metabolic Panel: Recent Labs  Lab 06/02/21 1906 06/03/21 0343  NA 139 134*  K 3.7 3.3*  CL 106 103  CO2 25 27  GLUCOSE 147* 118*  BUN 16 13  CREATININE 0.78 0.68  CALCIUM 9.8 8.8*    GFR: Estimated Creatinine Clearance: 41.4 mL/min (by C-G formula based on SCr of 0.68 mg/dL).  Liver Function Tests: Recent Labs  Lab 06/02/21 1906 06/03/21 0343  AST 20 17  ALT 17 15  ALKPHOS 75 56  BILITOT 0.4 0.1*  PROT 5.8* 4.4*  ALBUMIN 3.1* 2.4*   Recent Labs  Lab 06/02/21 1906  LIPASE 19   No results for input(s): AMMONIA in the last 168 hours.  Coagulation Profile: Recent Labs  Lab 06/03/21 0343  INR 1.2    Cardiac Enzymes: No results for input(s): CKTOTAL, CKMB, CKMBINDEX, TROPONINI in the last 168 hours.  BNP (last 3 results) No results for input(s): PROBNP in the last 8760 hours.  Lipid Profile: No results for input(s): CHOL, HDL, LDLCALC, TRIG, CHOLHDL, LDLDIRECT in the last 72 hours.  Thyroid Function Tests: No results for input(s): TSH, T4TOTAL, FREET4, T3FREE, THYROIDAB in the last 72 hours.  Anemia Panel: Recent Labs    06/03/21 0343  FERRITIN 92  TIBC 211*  IRON 105    Urine analysis:    Component Value Date/Time   COLORURINE STRAW (A) 04/14/2018 1521   APPEARANCEUR CLEAR 04/14/2018 1521   LABSPEC 1.015 04/14/2018 1521   PHURINE 7.0 04/14/2018 1521   GLUCOSEU NEGATIVE 04/14/2018 1521   HGBUR LARGE (A) 04/14/2018 1521   BILIRUBINUR NEGATIVE 04/14/2018 1521   BILIRUBINUR negative 11/08/2015 1258   KETONESUR NEGATIVE 04/14/2018 1521   PROTEINUR NEGATIVE 04/14/2018 1521   UROBILINOGEN 0.2 11/08/2015 1258   UROBILINOGEN 1.0 06/20/2009 1449   NITRITE  NEGATIVE 04/14/2018 1521   LEUKOCYTESUR NEGATIVE 04/14/2018 1521    Sepsis Labs: Lactic Acid, Venous    Component Value Date/Time   LATICACIDVEN 1.2 02/14/2019 0817    MICROBIOLOGY: Recent Results (from the past 240 hour(s))  Resp Panel by RT-PCR (Flu A&B, Covid) Nasopharyngeal Swab     Status: None   Collection Time: 06/02/21  9:42 PM  Specimen: Nasopharyngeal Swab; Nasopharyngeal(NP) swabs in vial transport medium  Result Value Ref Range Status   SARS Coronavirus 2 by RT PCR NEGATIVE NEGATIVE Final    Comment: (NOTE) SARS-CoV-2 target nucleic acids are NOT DETECTED.  The SARS-CoV-2 RNA is generally detectable in upper respiratory specimens during the acute phase of infection. The lowest concentration of SARS-CoV-2 viral copies this assay can detect is 138 copies/mL. A negative result does not preclude SARS-Cov-2 infection and should not be used as the sole basis for treatment or other patient management decisions. A negative result may occur with  improper specimen collection/handling, submission of specimen other than nasopharyngeal swab, presence of viral mutation(s) within the areas targeted by this assay, and inadequate number of viral copies(<138 copies/mL). A negative result must be combined with clinical observations, patient history, and epidemiological information. The expected result is Negative.  Fact Sheet for Patients:  EntrepreneurPulse.com.au  Fact Sheet for Healthcare Providers:  IncredibleEmployment.be  This test is no t yet approved or cleared by the [place] FDA and  has been authorized for detection and/or diagnosis of SARS-CoV-2 by FDA under an Emergency Use Authorization (EUA). This EUA will remain  in effect (meaning this test can be used) for the duration of the COVID-19 declaration under Section 564(b)(1) of the Act, 21 U.S.C.section 360bbb-3(b)(1), unless the authorization is terminated  or revoked  sooner.       Influenza A by PCR NEGATIVE NEGATIVE Final   Influenza B by PCR NEGATIVE NEGATIVE Final    Comment: (NOTE) The Xpert Xpress SARS-CoV-2/FLU/RSV plus assay is intended as an aid in the diagnosis of influenza from Nasopharyngeal swab specimens and should not be used as a sole basis for treatment. Nasal washings and aspirates are unacceptable for Xpert Xpress SARS-CoV-2/FLU/RSV testing.  Fact Sheet for Patients: EntrepreneurPulse.com.au  Fact Sheet for Healthcare Providers: IncredibleEmployment.be  This test is not yet approved or cleared by the [place] FDA and has been authorized for detection and/or diagnosis of SARS-CoV-2 by FDA under an Emergency Use Authorization (EUA). This EUA will remain in effect (meaning this test can be used) for the duration of the COVID-19 declaration under Section 564(b)(1) of the Act, 21 U.S.C. section 360bbb-3(b)(1), unless the authorization is terminated or revoked.  Performed at [facility], [place] [address] [place], [place] [address]     RADIOLOGY STUDIES/RESULTS: CT ABDOMEN PELVIS W CONTRAST  Result Date: 06/02/2021 CLINICAL DATA:  LLQ abdominal pain Left lower quadrant pain with rectal bleeding EXAM: CT ABDOMEN AND PELVIS WITH CONTRAST TECHNIQUE: Multidetector CT imaging of the abdomen and pelvis was performed using the standard protocol following bolus administration of intravenous contrast. CONTRAST:  84mL OMNIPAQUE IOHEXOL 300 MG/ML  SOLN COMPARISON:  CT abdomen pelvis 04/26/2020. MR abdomen 03/19/2019 FINDINGS: Lower chest: Coronary artery calcifications. Small left Bochdalek's hernia. Hepatobiliary: No focal liver abnormality. Layering hyperdensity consistent with tiny calcified gallstones within the gallbladder lumen. No gallbladder wall thickening or pericholecystic fluid. No biliary dilatation. Pancreas: Persistent 0.9 x 0.5cm calcified stone along the proximal pancreas with  persistent associated chronic diffuse main pancreatic duct dilatation. Spleen: Normal in size without focal abnormality. Adrenals/Urinary Tract: No adrenal nodule bilaterally. Bilateral kidneys enhance symmetrically. Parapelvic renal cysts. Subcentimeter hypodensties are too small to characterize. No hydronephrosis. No hydroureter. The urinary bladder is unremarkable. On delayed imaging, there is no urothelial wall thickening and there are no filling defects in the opacified portions of the bilateral collecting systems or ureters. Stomach/Bowel: Stomach is within normal limits. No evidence  of bowel wall thickening or dilatation. Under distension of the distal descending/proximal sigmoid colon with query mild bowel wall thickening and pericolonic stranding. Scattered colonic diverticulosis. Appendix appears normal. Vascular/Lymphatic: No abdominal aorta or iliac aneurysm. Mild atherosclerotic plaque of the aorta and its branches. No abdominal, pelvic, or inguinal lymphadenopathy. Reproductive: Status post hysterectomy. No adnexal masses. Other: No intraperitoneal free fluid. No intraperitoneal free gas. No organized fluid collection. Musculoskeletal: No abdominal wall hernia or abnormality. No suspicious lytic or blastic osseous lesions. No acute displaced fracture. Multilevel severe degenerative changes of the spine. IMPRESSION: 1. Scattered colonic diverticulosis with possible developing distal descending colon/proximal sigmoid colon mild acute diverticulitis. Limited evaluation due to under distension of the colon. 2. Cholelithiasis no CT finding of acute cholecystitis. 3. Chronic pancreatic appearance with calcification and associated main pancreatic duct dilatation. 4. Aortic Atherosclerosis (ICD10-I70.0). Electronically Signed   By: Iven Finn M.D.   On: 06/02/2021 21:58   CT Angio Abd/Pel W and/or Wo Contrast  Result Date: 06/02/2021 CLINICAL DATA:  GI bleed. EXAM: CTA ABDOMEN AND PELVIS WITHOUT AND  WITH CONTRAST TECHNIQUE: Multidetector CT imaging of the abdomen and pelvis was performed using the standard protocol during bolus administration of intravenous contrast. Multiplanar reconstructed images and MIPs were obtained and reviewed to evaluate the vascular anatomy. CONTRAST:  10mL OMNIPAQUE IOHEXOL 350 MG/ML SOLN, 9mL OMNIPAQUE IOHEXOL 300 MG/ML SOLN COMPARISON:  June 02, 2021 FINDINGS: VASCULAR Aorta: Mild calcification and atherosclerosis without aneurysm, dissection, vasculitis or significant stenosis. Celiac: Patent without evidence of aneurysm, dissection, vasculitis or significant stenosis. SMA: Patent without evidence of aneurysm, dissection, vasculitis or significant stenosis. Renals: Both renal arteries are patent without evidence of aneurysm, dissection, vasculitis, fibromuscular dysplasia or significant stenosis. IMA: Patent without evidence of aneurysm, dissection, vasculitis or significant stenosis. Inflow: Moderate to marked severity calcification without evidence of aneurysm, dissection, vasculitis or significant stenosis. Proximal Outflow: Bilateral common femoral and visualized portions of the superficial and profunda femoral arteries are patent without evidence of aneurysm, dissection, vasculitis or significant stenosis. Veins: No obvious venous abnormality within the limitations of this arterial phase study. Review of the MIP images confirms the above findings. NON-VASCULAR Lower chest: No acute abnormality. Hepatobiliary: No focal liver abnormality is seen. Numerous tiny gallstones are seen within the lumen of an otherwise normal-appearing gallbladder. Pancreas: A 1.1 cm focal calcification is seen within the pancreatic head, within the expected region of the distal pancreatic duct. Subsequent pancreatic ductal dilatation is noted (approximately 6 mm). No inflammatory changes are identified. Spleen: Normal in size without focal abnormality. Adrenals/Urinary Tract: Adrenal glands are  unremarkable. Kidneys are normal in size, without renal calculi or hydronephrosis. Bilateral parapelvic renal cysts are seen. Additional simple cysts are noted within the mid left kidney. Bladder is unremarkable. Stomach/Bowel: There is a small hiatal hernia. Appendix appears normal. No evidence of bowel dilatation. Noninflamed diverticula are noted throughout the large bowel. The mid to distal descending colon is moderately thickened and inflamed. This is stable in appearance when compared to the prior study Lymphatic: No abnormal abdominal or pelvic lymph nodes are identified. Reproductive: Status post hysterectomy. No adnexal masses. Other: No abdominal wall hernia or abnormality. No abdominopelvic ascites. Musculoskeletal: Marked severity multilevel degenerative changes seen throughout the lumbar spine. IMPRESSION: 1. Moderate severity colitis involving the mid to distal descending colon. 2. Colonic diverticulosis. 3. Cholelithiasis without evidence of cholecystitis. 4. Stable 1.1 cm focal calcification within the pancreatic head, within the expected region of the distal pancreatic duct. 5. Small hiatal hernia. 6.  Atherosclerosis of the abdominal aorta and arterial structures of the abdomen and pelvis, without evidence of aneurysmal dilatation, dissection or major vessel occlusion. Aortic Atherosclerosis (ICD10-I70.0). Electronically Signed   By: Virgina Norfolk M.D.   On: 06/02/2021 23:14     LOS: 0 days   Oren Binet, MD  [facility]    To contact the attending provider between 7A-7P or the covering provider during after hours 7P-7A, please log into the web site [email or web address] and access using universal [place] password for that web site. If you do not have the password, please call the hospital operator.  06/03/2021, 10:53 AM

## 2021-06-03 NOTE — Progress Notes (Signed)
Pt. Arrives via ED stretcher. GCS-15, with VSS. CCMD notified with NSR on monitor. No c/o pain at present time. CHG wipes utilized with no breakdown noted on buttocks. Orders given and carried out, will continue to monitor closely

## 2021-06-03 NOTE — Consult Note (Addendum)
Attending physician's note   I have taken an interval history, reviewed the chart and examined the patient. I agree with the Advanced Practitioner's note, impression, and recommendations as outlined.   85 year old female with medical history as outlined below known to me as an outpatient, presents with left-sided abdominal pain about 8 days ago which lasted 2-3 days, and improved with bland, brat diet.  She had recovered to her usual state of health, then onset of bloody stool with clots yesterday.  No recurrence of the pain.  Did have a second episode of hematochezia last evening again described as clots.  Admission evaluation notable for the following:  - H/H 9.8/28.8 (was 14/41 in 07/2019) - PLT 180 - Ferritin 92, iron 105, TIBC 211, sat 50% - CT abdomen/pelvis: Diverticulosis with possible mild acute diverticulitis in the distal descending/proximal sigmoid (vs underdistention).  Persistent calcified pancreatic stone.  Cholelithiasis without cholecystitis or duct dilation. - CT angio: Patent vasculature without extravasation.  Diverticulosis.  Moderate thickening/inflammation of the mid to distal descending colon.  Cholelithiasis without cholecystitis.  Did have hospital admission in 04/2020 for similar presentation.  CT angio at that time with focal hemorrhage in the distal transverse colon but then no active bleeding on subsequent angiography by IR.  No repeat labs since that admission for review to ensure normalization.  1) Medication 2) Diverticulosis 3) History of lower GI bleed 4) Acute blood loss anemia 5) Left-sided abdominal pain-resolved 6) Colitis on CT  - I had an extensive conversation with [name] today.  We discussed treatment options to include conservative management vs repeat colonoscopy along with trial course of antibiotics.  She does not want to undergo endoscopic  evaluation unless absolutely indicated, which seems quite reasonable.  Given lack of persistent bleeding and overall hemodynamic stability, trial of conservative management appropriate at this time - I have a low suspicion that the imaging does represent diverticulitis.  Instead, clinical presentation would be more suspicious of ischemic colitis given distribution of injury, symptomatology, and reported relationship of onset with increased exertion - Clears for now - Continue CBC trend - Okay to continue ABX for now, but likely will maintain only short course - If e/o recurrent bleeding, could consider repeat colonoscopy  Cephas Darby, FACG [phone] office                                                      [facility] Consult: 10:36 AM 06/03/2021  LOS: 0 days    Referring Provider: Dr Sloan Leiter  Primary Care Physician:  Horald Pollen, MD Primary Gastroenterologist: Dr. Bryan Lemma.    Reason for Consultation: Painless hematochezia   HPI: Latoya Bennett is a 85 y.o. female.     Recurrent diverticular bleeds. 2007 colonoscopy.  For hematochezia.  Dr. Ardis Hughs.  Colon diverticulosis.  Presumed to have had diverticular bleed. 05/2018 Tagged RBC study: Bleeding activity at distal stomach/proximal duodenum. 04/2018 EGD.  Dr. Paulita Fujita.   For hematochezia, abnormal RBC study, Hgb 8.9, transfused 3 PRBC.  Found: small HH, mild pre-pyloric gastritis.  O/W normal endoscopy to second portion of duodenum.  On further review of tagged RBC study, IR felt that the bleed may have been in the proximal colon rather than small bowel. 02/2019 CTAP w angio.   No active bleeding, no bowel ischemia.  Colon diverticulosis.  Atrophic pancreas with stable areas of dilation and patent main pancreatic duct. 02/2019 colonoscopy.  For hematochezia, blood loss anemia.  Pandiverticulosis.  Blood in rectum, rectosigmoid, sigmoid, distal descending colon.  Two, 2 to 5 mm, polyps at  transverse and ascending colon, not removed. 03/2019 MR abdomen.  For evaluation of pancreatic duct.  Stable diffuse, irregular dilation of main pancreatic duct and diffuse pancreatic atrophy.  Pancreatic calcification seen on prior CTs not evident by MRI.  Overall features C/W sequela of chronic pancreatitis. 04/2020 CTAP w angio.  Focal hemorrhage in distal transverse colon.  Diverticulosis. 04/2020 IR angiography with mesenteric angiography.  No evidence for active bleeding, particular attention paid to mid colic artery.  No intervention performed.  SMA widely patent.  Mid colic artery patent.  Starting last weekend she had 2 or 3 days with abdominal pain in the mid to left abdomen.  It was not severe.  Not associated with nausea or vomiting.  By midweek and Thanksgiving she was doing fine and able to eat her holiday meal.  Had a brown stool yesterday morning but at about noon passed bloody stool with clots, still no nausea or vomiting.  Symptoms similar to presentation a year ago October.  No fever, chills, vomiting, nausea, use of NSAIDs. FOBT +.   Presented to the ED and had her second and latest episode hematochezia at about 8 PM last night.  Patient says that on Saturday she got a bit worn out by decorating her house for Christmas.  Normally has a good appetite, has [employer] Whe[employer] deliver food daily.  No dysphagia.   Hgb 9.8 ... 7.9.  baseline 8.1 one year ago.  MCV 74, 75 a year ago. WBCs, platelets normal.  LFTs normal. Na 134.  Potassium 3.3.  CTAP w contrast.  Scattered colonic diverticulosis, possible mild acute diverticulitis in the distal descending/proximal sigmoid.  Cholelithiasis.  Calcifications and main pancreatic duct dilatation c/w chronic pancreatitis.  CTAP with angio.  Moderate severity colitis of mid and distal descending colon.  Colon diverticulosis.  Uncomplicated cholelithiasis.  Stable calcification at pancreatic head with associated regional dilation at distal PD.  Small  HH.  Atherosclerosis of abdominal aorta and arterial structures in the abdomen/pelvis.  No aneurysm, dissection, major vessel occlusion.  Patient lives alone in her home.  She has aides that come by during the weekdays.  No alcohol intake currently or significant EtOH use in the past. Family history negative for ulcer disease, GI bleeds, diverticular disease, colon cancer.  Diabetes and hypertension do run in her siblings.   Past Medical History:  Diagnosis Date   Arthritis    Diverticulitis    GERD (gastroesophageal reflux disease)    GI bleed    Hypertension     Past Surgical History:  Procedure Laterality Date   ABDOMINAL HYSTERECTOMY     COLONOSCOPY Left 02/15/2019   Procedure: COLONOSCOPY;  Surgeon: Lavena Bullion, DO;  Location: [facility];  Service: Gastroenterology;  Laterality: Left;   ESOPHAGOGASTRODUODENOSCOPY (EGD) WITH PROPOFOL Left 04/18/2018   Procedure: ESOPHAGOGASTRODUODENOSCOPY (EGD) WITH PROPOFOL;  Surgeon: Arta Silence, MD;  Location: [facility];  Service: Endoscopy;  Laterality: Left;   IR ANGIOGRAM SELECTIVE EACH ADDITIONAL VESSEL  04/27/2020   IR ANGIOGRAM SELECTIVE EACH ADDITIONAL VESSEL  04/27/2020   IR ANGIOGRAM SELECTIVE EACH ADDITIONAL VESSEL  04/27/2020   IR ANGIOGRAM VISCERAL SELECTIVE  04/26/2020   IR ANGIOGRAM VISCERAL SELECTIVE  04/26/2020   IR ANGIOGRAM VISCERAL SELECTIVE  04/27/2020   IR US GUIDE  VASC ACCESS RIGHT  04/26/2020   IR US GUIDE VASC ACCESS RIGHT  04/27/2020    Prior to Admission medications   Medication Sig Start Date End Date Taking? Authorizing Provider  amLODipine (NORVASC) 10 MG tablet Take 1 tablet (10 mg total) by mouth daily. 10/10/20  Yes Sagardia, Ines Bloomer, MD  diclofenac Sodium (VOLTAREN) 1 % GEL Apply 2 g topically 4 (four) times daily. Patient taking differently: Apply 2 g topically 4 (four) times daily as needed (knee/leg pain). 04/30/20  Yes Adhikari, Tamsen Meek, MD  FEROSUL 325 (65 Fe) MG tablet TAKE 2  TABLETS(650 MG) BY MOUTH EVERY OTHER DAY Patient taking differently: 650 mg every other day. 01/30/21  Yes Kaylean Tupou V, DO  lactose free nutrition (BOOST) LIQD Take 237 mLs by mouth daily.   Yes [provider]  Menthol, Topical Analgesic, (BIOFREEZE) 4 % GEL Apply 1 application topically daily.   Yes [provider]  Multiple Vitamin (MULTIVITAMIN WITH MINERALS) TABS tablet Take 1 tablet by mouth daily. 02/18/19  Yes Sheikh, [name], DO  Potassium 99 MG TABS Take 99 mg by mouth daily.   Yes [provider]  feeding supplement, ENSURE ENLIVE, (ENSURE ENLIVE) LIQD Take 237 mLs by mouth 2 (two) times daily between meals. Patient not taking: Reported on 04/26/2020 02/17/19   Raiford Noble Latif, DO    Scheduled Meds:  sodium chloride flush  3 mL Intravenous Q12H   Infusions:  cefTRIAXone (ROCEPHIN)  IV     metronidazole 500 mg (06/03/21 0807)   PRN Meds: acetaminophen **OR** acetaminophen, ondansetron **OR** ondansetron (ZOFRAN) IV   Allergies as of 06/02/2021   (No Known Allergies)    Family History  Problem Relation Age of Onset   Hypertension Sister    Colon cancer Neg Hx    Esophageal cancer Neg Hx     Social History   Socioeconomic History   Marital status: Single    Spouse name: Not on file   Number of children: Not on file   Years of education: Not on file   Highest education level: Not on file  Occupational History   Occupation: retired  Tobacco Use   Smoking status: Never   Smokeless tobacco: Never  Vaping Use   Vaping Use: Never used  Substance and Sexual Activity   Alcohol use: Not Currently    Alcohol/week: 0.0 standard drinks   Drug use: Never   Sexual activity: Not on file  Other Topics Concern   Not on file  Social History Narrative   Marital status: single; not dating in 2018; moved from [place] to be near sister; not happy in [place].  Lived [place] for 50 years.      Children: no children      Lives: alone with  cat      Employment: retired; Optometrist with computers      Tobacco:       Alcohol:       ADLs; independent with ADLs; drives   Social Determinants of Radio broadcast assistant Strain: Not on file  Food Insecurity: Not on file  Transportation Needs: Not on file  Physical Activity: Not on file  Stress: Not on file  Social Connections: Not on file  Intimate Partner Violence: Not on file    REVIEW OF SYSTEMS: Constitutional: Some fatigue ENT:  No nose bleeds Pulm: No shortness of breath or cough CV:  No palpitations, no LE edema.  No angina GU:  No hematuria, no frequency GI: Per  HPI. Heme: Denies excessive or unusual bleeding or bruising. Transfusions: In the past.  None so far this admission. Neuro:  No headaches, no peripheral tingling or numbness.  No syncope, no seizures. Derm:  No itching, no rash or sores.  Endocrine:  No sweats or chills.  No polyuria or dysuria Immunization: Reviewed. Travel:  None beyond local counties in last few months.    PHYSICAL EXAM: Vital signs in last 24 hours: Vitals:   06/03/21 0432 06/03/21 0745  BP: (!) 112/59 (!) 109/52  Pulse: 88 89  Resp: 10 16  Temp: 98 F (36.7 C) 97.7 F (36.5 C)  SpO2: 99% 99%   Wt Readings from Last 3 Encounters:  06/02/21 58.1 kg  04/26/20 56.7 kg  03/07/19 56.2 kg    General: Pleasant, elderly, well-appearing, comfortable. Head: No facial asymmetry or swelling.  No signs of head trauma. Eyes: Conjunctiva pink.  No scleral icterus.  EOMI Ears: Not hard of hearing Nose: No discharge or congestion. Mouth: Oral mucosa is moist, pink, clear.  Edentulous.  Tongue midline. Neck: No JVD, no masses, no thyromegaly Lungs: No labored breathing or cough.  Lungs CTA bilaterally. Heart: RRR.  No MRG.  S1, S2 present. Abdomen: Soft.  Minimal tenderness without guarding or rebound in the left mid and lower abdomen.  Bowel sounds active.  No distention..   Rectal: Deferred.  Bloody specimen  submitted to lab yesterday is FOBT positive. Musc/Skeltl: No joint redness, swelling or gross deformity. Extremities: No CCE. Neurologic: Pleasant, calm.  Fully alert and oriented.  Moves all 4 limbs without gross weakness.  No tremors. Skin: No rash, no sores, no suspicious lesions, no telangiectasia. Tattoos: None observed. Nodes: No cervical adenopathy Psych: Calm, pleasant, cooperative.  Fluid speech.  Intake/Output from previous day: 11/27 0701 - 11/28 0700 In: -  Out: 300 [Urine:300] Intake/Output this shift: No intake/output data recorded.  LAB RESULTS: Recent Labs    06/02/21 2044 06/03/21 0343  WBC 6.3 4.3  HGB 9.8* 8.1*  HCT 28.8* 24.3*  PLT 180 150   BMET Lab Results  Component Value Date   NA 134 (L) 06/03/2021   NA 139 06/02/2021   NA 139 04/29/2020   K 3.3 (L) 06/03/2021   K 3.7 06/02/2021   K 3.5 04/29/2020   CL 103 06/03/2021   CL 106 06/02/2021   CL 106 04/29/2020   CO2 27 06/03/2021   CO2 25 06/02/2021   CO2 27 04/29/2020   GLUCOSE 118 (H) 06/03/2021   GLUCOSE 147 (H) 06/02/2021   GLUCOSE 116 (H) 04/29/2020   BUN 13 06/03/2021   BUN 16 06/02/2021   BUN 11 04/29/2020   CREATININE 0.68 06/03/2021   CREATININE 0.78 06/02/2021   CREATININE 0.55 04/29/2020   CALCIUM 8.8 (L) 06/03/2021   CALCIUM 9.8 06/02/2021   CALCIUM 9.6 04/29/2020   LFT Recent Labs    06/02/21 1906 06/03/21 0343  PROT 5.8* 4.4*  ALBUMIN 3.1* 2.4*  AST 20 17  ALT 17 15  ALKPHOS 75 56  BILITOT 0.4 0.1*   PT/INR Lab Results  Component Value Date   INR 1.2 06/03/2021   INR 1.1 04/26/2020   INR 1.2 02/12/2019   Hepatitis Panel No results for input(s): HEPBSAG, HCVAB, HEPAIGM, HEPBIGM in the last 72 hours. C-Diff No components found for: CDIFF Lipase     Component Value Date/Time   LIPASE 19 06/02/2021 1906    Drugs of Abuse  No results found for: LABOPIA, [place], LABBENZ, AMPHETMU, THCU, LABBARB  RADIOLOGY STUDIES: CT ABDOMEN PELVIS W  CONTRAST  Result Date: 06/02/2021 CLINICAL DATA:  LLQ abdominal pain Left lower quadrant pain with rectal bleeding EXAM: CT ABDOMEN AND PELVIS WITH CONTRAST TECHNIQUE: Multidetector CT imaging of the abdomen and pelvis was performed using the standard protocol following bolus administration of intravenous contrast. CONTRAST:  41mL OMNIPAQUE IOHEXOL 300 MG/ML  SOLN COMPARISON:  CT abdomen pelvis 04/26/2020. MR abdomen 03/19/2019 FINDINGS: Lower chest: Coronary artery calcifications. Small left Bochdalek's hernia. Hepatobiliary: No focal liver abnormality. Layering hyperdensity consistent with tiny calcified gallstones within the gallbladder lumen. No gallbladder wall thickening or pericholecystic fluid. No biliary dilatation. Pancreas: Persistent 0.9 x 0.5cm calcified stone along the proximal pancreas with persistent associated chronic diffuse main pancreatic duct dilatation. Spleen: Normal in size without focal abnormality. Adrenals/Urinary Tract: No adrenal nodule bilaterally. Bilateral kidneys enhance symmetrically. Parapelvic renal cysts. Subcentimeter hypodensties are too small to characterize. No hydronephrosis. No hydroureter. The urinary bladder is unremarkable. On delayed imaging, there is no urothelial wall thickening and there are no filling defects in the opacified portions of the bilateral collecting systems or ureters. Stomach/Bowel: Stomach is within normal limits. No evidence of bowel wall thickening or dilatation. Under distension of the distal descending/proximal sigmoid colon with query mild bowel wall thickening and pericolonic stranding. Scattered colonic diverticulosis. Appendix appears normal. Vascular/Lymphatic: No abdominal aorta or iliac aneurysm. Mild atherosclerotic plaque of the aorta and its branches. No abdominal, pelvic, or inguinal lymphadenopathy. Reproductive: Status post hysterectomy. No adnexal masses. Other: No intraperitoneal free fluid. No intraperitoneal free gas. No  organized fluid collection. Musculoskeletal: No abdominal wall hernia or abnormality. No suspicious lytic or blastic osseous lesions. No acute displaced fracture. Multilevel severe degenerative changes of the spine. IMPRESSION: 1. Scattered colonic diverticulosis with possible developing distal descending colon/proximal sigmoid colon mild acute diverticulitis. Limited evaluation due to under distension of the colon. 2. Cholelithiasis no CT finding of acute cholecystitis. 3. Chronic pancreatic appearance with calcification and associated main pancreatic duct dilatation. 4. Aortic Atherosclerosis (ICD10-I70.0). Electronically Signed   By: Iven Finn M.D.   On: 06/02/2021 21:58   CT Angio Abd/Pel W and/or Wo Contrast  Result Date: 06/02/2021 CLINICAL DATA:  GI bleed. EXAM: CTA ABDOMEN AND PELVIS WITHOUT AND WITH CONTRAST TECHNIQUE: Multidetector CT imaging of the abdomen and pelvis was performed using the standard protocol during bolus administration of intravenous contrast. Multiplanar reconstructed images and MIPs were obtained and reviewed to evaluate the vascular anatomy. CONTRAST:  71mL OMNIPAQUE IOHEXOL 350 MG/ML SOLN, 35mL OMNIPAQUE IOHEXOL 300 MG/ML SOLN COMPARISON:  June 02, 2021 FINDINGS: VASCULAR Aorta: Mild calcification and atherosclerosis without aneurysm, dissection, vasculitis or significant stenosis. Celiac: Patent without evidence of aneurysm, dissection, vasculitis or significant stenosis. SMA: Patent without evidence of aneurysm, dissection, vasculitis or significant stenosis. Renals: Both renal arteries are patent without evidence of aneurysm, dissection, vasculitis, fibromuscular dysplasia or significant stenosis. IMA: Patent without evidence of aneurysm, dissection, vasculitis or significant stenosis. Inflow: Moderate to marked severity calcification without evidence of aneurysm, dissection, vasculitis or significant stenosis. Proximal Outflow: Bilateral common femoral and  visualized portions of the superficial and profunda femoral arteries are patent without evidence of aneurysm, dissection, vasculitis or significant stenosis. Veins: No obvious venous abnormality within the limitations of this arterial phase study. Review of the MIP images confirms the above findings. NON-VASCULAR Lower chest: No acute abnormality. Hepatobiliary: No focal liver abnormality is seen. Numerous tiny gallstones are seen within the lumen of an otherwise normal-appearing gallbladder. Pancreas: A 1.1 cm focal calcification is seen  within the pancreatic head, within the expected region of the distal pancreatic duct. Subsequent pancreatic ductal dilatation is noted (approximately 6 mm). No inflammatory changes are identified. Spleen: Normal in size without focal abnormality. Adrenals/Urinary Tract: Adrenal glands are unremarkable. Kidneys are normal in size, without renal calculi or hydronephrosis. Bilateral parapelvic renal cysts are seen. Additional simple cysts are noted within the mid left kidney. Bladder is unremarkable. Stomach/Bowel: There is a small hiatal hernia. Appendix appears normal. No evidence of bowel dilatation. Noninflamed diverticula are noted throughout the large bowel. The mid to distal descending colon is moderately thickened and inflamed. This is stable in appearance when compared to the prior study Lymphatic: No abnormal abdominal or pelvic lymph nodes are identified. Reproductive: Status post hysterectomy. No adnexal masses. Other: No abdominal wall hernia or abnormality. No abdominopelvic ascites. Musculoskeletal: Marked severity multilevel degenerative changes seen throughout the lumbar spine. IMPRESSION: 1. Moderate severity colitis involving the mid to distal descending colon. 2. Colonic diverticulosis. 3. Cholelithiasis without evidence of cholecystitis. 4. Stable 1.1 cm focal calcification within the pancreatic head, within the expected region of the distal pancreatic duct. 5.  Small hiatal hernia. 6. Atherosclerosis of the abdominal aorta and arterial structures of the abdomen and pelvis, without evidence of aneurysmal dilatation, dissection or major vessel occlusion. Aortic Atherosclerosis (ICD10-I70.0). Electronically Signed   By: Virgina Norfolk M.D.   On: 06/02/2021 23:14      IMPRESSION:     Hematochezia and non-severe LLQ pain in pt w hx divertic bleeding.   Current MRI c/w colitis involving descending colon, CT suggestive of possible diverticulitis in this area.  IR angiogram one year ago showed patent SMA and mid colic artery.  Currently WBCs. LFTs normal.  No fevers.  No blood thinners.  Day 1 Rocephin, metronidazole.  Chronic, stable pancreatic calcifications and dilated regions of pancreatic duct.  All  c/w chronic calcific pancreatitis.    Mild hyponatremia and hypokalemia.   PLAN:     Supportive care.  Need for repeat colonoscopy vs flex sig?  However if this is diverticulitis, colonoscopy contraindicated.  Leave on clear liquids for now but consider advancing diet as tolerated.   Azucena Freed  06/03/2021, 10:36 AM Phone [phone]

## 2021-06-04 DIAGNOSIS — K5731 Diverticulosis of large intestine without perforation or abscess with bleeding: Secondary | ICD-10-CM | POA: Diagnosis not present

## 2021-06-04 DIAGNOSIS — D62 Acute posthemorrhagic anemia: Secondary | ICD-10-CM | POA: Diagnosis not present

## 2021-06-04 DIAGNOSIS — K922 Gastrointestinal hemorrhage, unspecified: Secondary | ICD-10-CM | POA: Diagnosis not present

## 2021-06-04 LAB — CBC
HCT: 22.5 % — ABNORMAL LOW (ref 36.0–46.0)
HCT: 23.8 % — ABNORMAL LOW (ref 36.0–46.0)
HCT: 27.7 % — ABNORMAL LOW (ref 36.0–46.0)
HCT: 23.2 % — ABNORMAL LOW (ref 36.0–46.0)
Hemoglobin: 7.9 g/dL — ABNORMAL LOW (ref 12.0–15.0)
Hemoglobin: 8.2 g/dL — ABNORMAL LOW (ref 12.0–15.0)
Hemoglobin: 9.3 g/dL — ABNORMAL LOW (ref 12.0–15.0)
Hemoglobin: 8.1 g/dL — ABNORMAL LOW (ref 12.0–15.0)
MCH: 25.7 pg — ABNORMAL LOW (ref 26.0–34.0)
MCH: 25.3 pg — ABNORMAL LOW (ref 26.0–34.0)
MCH: 24.7 pg — ABNORMAL LOW (ref 26.0–34.0)
MCH: 25.9 pg — ABNORMAL LOW (ref 26.0–34.0)
MCHC: 35.1 g/dL (ref 30.0–36.0)
MCHC: 34.5 g/dL (ref 30.0–36.0)
MCHC: 33.6 g/dL (ref 30.0–36.0)
MCHC: 34.9 g/dL (ref 30.0–36.0)
MCV: 73.3 fL — ABNORMAL LOW (ref 80.0–100.0)
MCV: 73.5 fL — ABNORMAL LOW (ref 80.0–100.0)
MCV: 73.7 fL — ABNORMAL LOW (ref 80.0–100.0)
MCV: 74.1 fL — ABNORMAL LOW (ref 80.0–100.0)
Platelets: 149 10*3/uL — ABNORMAL LOW (ref 150–400)
Platelets: 144 10*3/uL — ABNORMAL LOW (ref 150–400)
Platelets: 189 10*3/uL (ref 150–400)
Platelets: 156 10*3/uL (ref 150–400)
RBC: 3.07 MIL/uL — ABNORMAL LOW (ref 3.87–5.11)
RBC: 3.24 MIL/uL — ABNORMAL LOW (ref 3.87–5.11)
RBC: 3.76 MIL/uL — ABNORMAL LOW (ref 3.87–5.11)
RBC: 3.13 MIL/uL — ABNORMAL LOW (ref 3.87–5.11)
RDW: 13.8 % (ref 11.5–15.5)
RDW: 13.9 % (ref 11.5–15.5)
RDW: 13.9 % (ref 11.5–15.5)
RDW: 14 % (ref 11.5–15.5)
WBC: 3.5 10*3/uL — ABNORMAL LOW (ref 4.0–10.5)
WBC: 3.4 10*3/uL — ABNORMAL LOW (ref 4.0–10.5)
WBC: 6.3 10*3/uL (ref 4.0–10.5)
WBC: 5 10*3/uL (ref 4.0–10.5)
nRBC: 0 % (ref 0.0–0.2)
nRBC: 0 % (ref 0.0–0.2)
nRBC: 0 % (ref 0.0–0.2)
nRBC: 0 % (ref 0.0–0.2)

## 2021-06-04 LAB — BASIC METABOLIC PANEL
Anion gap: 3 — ABNORMAL LOW (ref 5–15)
BUN: 8 mg/dL (ref 8–23)
CO2: 27 mmol/L (ref 22–32)
Calcium: 9.3 mg/dL (ref 8.9–10.3)
Chloride: 107 mmol/L (ref 98–111)
Creatinine, Ser: 0.65 mg/dL (ref 0.44–1.00)
GFR, Estimated: >60 mL/min (ref >60)
Glucose, Bld: 114 mg/dL — ABNORMAL HIGH (ref 70–99)
Potassium: 3.5 mmol/L (ref 3.5–5.1)
Sodium: 137 mmol/L (ref 135–145)

## 2021-06-04 LAB — MAGNESIUM: Magnesium: 1.8 mg/dL (ref 1.7–2.4)

## 2021-06-04 NOTE — Progress Notes (Addendum)
Bright red blood and black clots in BM. Notified MD. Ordered to hold discharge. Will continue to monitor.  Era Bumpers, RN

## 2021-06-04 NOTE — Progress Notes (Signed)
Discharge instructions (including medications) discussed with and copy provided to patient/caregiver 

## 2021-06-04 NOTE — Evaluation (Signed)
Physical Therapy Evaluation and Discharge  Patient Details Name: Latoya Bennett MRN: [id number] DOB: Oct 21, 1935 Today's Date: 06/04/2021  History of Present Illness  Pt is an 85 y.o. female admitted 06/02/21 with hematochezia. Workup for lower GIB due to diverticulosis and acute blood loss anemia. Pt preparing for d/c home 11/29 but hematochezia reoccurred therefore d/c held. PMH includes HTN, diverticular bleed, gout.   Clinical Impression  PTA, pt was modified independent in ADLs and mobility. She ambulated with DME in and outside of the home. Today, pt continues to be modified independent in transfers and ambulated with supervision in room. Pt declined ambulating in the hall due to fear of the bleeding starting again, but was otherwise willing to participate. Pt lives alone but has help nearby if needed. Pt does not think she needs any further PT services - PT in agreement due to pt being at baseline level of function. Will d/c PT services at this time.      Recommendations for follow up therapy are one component of a multi-disciplinary discharge planning process, led by the attending physician.  Recommendations may be updated based on patient status, additional functional criteria and insurance authorization.  Follow Up Recommendations No PT follow up    Assistance Recommended at Discharge PRN  Functional Status Assessment   Equipment Recommendations  None recommended by PT    Recommendations for Other Services       Precautions / Restrictions Precautions Precautions: Fall Restrictions Weight Bearing Restrictions: No      Mobility  Bed Mobility Overal bed mobility: Modified Independent             General bed mobility comments: able to go supine to sit with HOB elevated and use of bedrail    Transfers Overall transfer level: Modified independent Equipment used: Straight cane               General transfer comment: stood from EOB with use of cane     Ambulation/Gait Ambulation/Gait assistance: Supervision Gait Distance (Feet): 28 Feet Assistive device: Rolling walker (2 wheels);Straight cane;None Gait Pattern/deviations: Step-through pattern;Wide base of support;Decreased stride length Gait velocity: decreased     General Gait Details: Pt started ambulating with straight cane, then decided to try RW, walked a couple feet back to bed without DME; supervision for safety. Discussed with pt about what assistive device to use at home. Educated her on how it would be a good idea to use a walker for added stability instead of a cane.  Stairs            Wheelchair Mobility    Modified Rankin (Stroke Patients Only)       Balance Overall balance assessment: Needs assistance Sitting-balance support: Feet supported Sitting balance-Leahy Scale: Good Sitting balance - Comments: able to sit EOB and don socks with no physical assist     Standing balance-Leahy Scale: Fair Standing balance comment: Pt took a couple steps without DME but prefers something to hold onto. Was reaching for furniture with other UE when using cane.                             Pertinent Vitals/Pain Pain Assessment: Faces Faces Pain Scale: No hurt    Home Living Family/patient expects to be discharged to:: Private residence Living Arrangements: Alone Available Help at Discharge: Family;Friend(s);Available PRN/intermittently Type of Home: House Home Access: Level entry       Home Layout: One level Home Equipment:  Rolling Walker (2 wheels);Rollator (4 wheels);Wheelchair - manual Additional Comments: Has a friend that helps her as needed, lives about a block away    Prior Function Prior Level of Function : Independent/Modified Independent;Driving             Mobility Comments: Pt was ambulating with DME inside and outside the home. Pt values her independence. ADLs Comments: Independent in ADLs. Retired. Likes to work outside, put her  Christmas decorations up recently.     Hand Dominance        Extremity/Trunk Assessment   Upper Extremity Assessment Upper Extremity Assessment: Overall WFL for tasks assessed    Lower Extremity Assessment Lower Extremity Assessment: Generalized weakness       Communication   Communication: No difficulties  Cognition Arousal/Alertness: Awake/alert Behavior During Therapy: WFL for tasks assessed/performed Overall Cognitive Status: No family/caregiver present to determine baseline cognitive functioning Area of Impairment: Following commands;Attention;Safety/judgement;Awareness                   Current Attention Level: Sustained   Following Commands: Follows one step commands consistently Safety/Judgement: Decreased awareness of safety Awareness: Emergent   General Comments: Pt required frequent redirection to stay on task, changed answers to questions several times. She was unsatisfied with her care and said she was going to sign herself out and drive to a hospital in [place] notified.        General Comments General comments (skin integrity, edema, etc.): Pt complained of dizziness while standing after walking, BP 139/72 after therapy    Exercises     Assessment/Plan    PT Assessment Patient does not need any further PT services  PT Problem List Decreased strength;Decreased mobility;Decreased safety awareness;Decreased balance;Decreased cognition       PT Treatment Interventions      PT Goals (Current goals can be found in the Care Plan section)  Acute Rehab PT Goals Patient Stated Goal: to return home PT Goal Formulation: With patient Time For Goal Achievement: 06/18/21 Potential to Achieve Goals: Good    Frequency     Barriers to discharge        Co-evaluation               AM-PAC PT "6 Clicks" Mobility  Outcome Measure Help needed turning from your back to your side while in a flat bed without using bedrails?: None Help needed  moving from lying on your back to sitting on the side of a flat bed without using bedrails?: None Help needed moving to and from a bed to a chair (including a wheelchair)?: None Help needed standing up from a chair using your arms (e.g., wheelchair or bedside chair)?: None Help needed to walk in hospital room?: A Little Help needed climbing 3-5 steps with a railing? : A Little 6 Click Score: 22    End of Session Equipment Utilized During Treatment: Gait belt Activity Tolerance: Patient tolerated treatment well Patient left: in bed;with nursing/sitter in room Nurse Communication: Mobility status;Other (comment) (pt concerns with care) PT Visit Diagnosis: Muscle weakness (generalized) (M62.81);Other abnormalities of gait and mobility (R26.89);Unsteadiness on feet (R26.81)    Time: 8546-2703 PT Time Calculation (min) (ACUTE ONLY): 21 min   Charges:   PT Evaluation $PT Eval Low Complexity: 1 Low          Brandon Melnick, SPT   Brandon Melnick 06/04/2021, 5:22 PM

## 2021-06-04 NOTE — Progress Notes (Signed)
   06/04/21 1330  Clinical Encounter Type  Visited With Patient  Visit Type Follow-up;Spiritual support  Referral From Chaplain  Consult/Referral To Chaplain    The patient's signature was witnessed by two volunteer service associates and notarized. When Ike Bene returned with copies, physical therapy was with the patient . A copy was given to the unit secretary, the original and three copies given to the patient. While waiting for the notary, the patient received a call from her great nephew stating he needs assist to repair his car. The patient appeared to be upset that her family calls for money while she is in the hospital. Ike Bene asked open ended questions to help the patient to see how she can set boundaries. This note was prepared by Jeanine Luz, M.Div..  For questions please contact by phone [phone].

## 2021-06-04 NOTE — Discharge Summary (Addendum)
PATIENT DETAILS Name: Latoya Bennett Age: 85 y.o. Sex: female Date of Birth: 09-10-1935 MRN: [id number]. Admitting Physician: Jonetta Osgood, MD [id number], Ines Bloomer, MD  Admit Date: 06/02/2021 Discharge date: 06/05/2021  Recommendations for Outpatient Follow-up:  Follow up with PCP in 1-2 weeks Please obtain CMP/CBC in one week Please ensure follow-up with gastroenterology.  Admitted From:  Home  Disposition: [place]: No  Equipment/Devices: None  Discharge Condition: Stable  CODE STATUS: FULL CODE  Diet recommendation:  Diet Order             Diet regular Room service appropriate? Yes; Fluid consistency: Thin  Diet effective now           Diet - low sodium heart healthy                    Brief Summary: Patient is a 85 y.o. female with history of prior diverticular bleeding, HTN-presented with painless hematochezia.  Brief Hospital Course: Lower GI bleeding due to diverticulosis and acute blood loss anemia: managed with supportive care-CT angio of the abdomen was negative for any active extravasation.  Hemoglobin was stable throughout this hospitalization-did not require blood transfusion.  Plans were to discharge her on 11/29-however she had another episode of hematochezia.  Discharge was held-patient was monitored-her hemoglobin remained stable-she appears to be passing old blood.  Patient is very anxious to be discharged home today-since her hemoglobin is stable and per patient-stools are dark and not bloody like they were before-and highly likely that she is passing old blood and not actively bleeding-after discussion with GI team-okay for discharge.  GI team will arrange for follow-up in a week for repeat CBC.     ?  Diverticulitis: Has CT evidence of diverticulitis-however has no abdominal pain-abdomen is very benign on exam.  She was empirically started on IV antimicrobial therapy-since she has no clinical evidence of  diverticulitis-I do not think she requires any more antibiotics on discharge.   HTN: BP stable but creeping up-resume amlodipine on discharge.   Hypokalemia: Repleted   BMI Estimated body mass index is 25.85 kg/m as calculated from the following:   Height as of this encounter: 4\' 11"  (1.499 m).   Weight as of this encounter: 58.1 kg.    Note-patient was ambulated by RN staff-with her regular cane-and seems to be at her baseline.   Procedures None  Discharge Diagnoses:  Principal Problem:   Acute GI bleeding Active Problems:   Essential hypertension   Diverticulitis   Lower GI bleeding   Acute blood loss anemia   Abnormal finding on GI tract imaging   Discharge Instructions:  Activity:  As tolerated with Full fall precautions use walker/cane & assistance as needed  Discharge Instructions     Diet - low sodium heart healthy   Complete by: As directed    Discharge instructions   Complete by: As directed    Follow with Primary MD  Horald Pollen, MD in 1-2 weeks  Gastroenterology office will call you with a follow-up appointment for follow-up and blood work.  If you have recurrent bright red blood per rectum-please seek immediate medical attention.  Please get a complete blood count and chemistry panel checked by your Primary MD at your next visit, and again as instructed by your Primary MD.  Get Medicines reviewed and adjusted: Please take all your medications with you for your next visit with your Primary MD  Laboratory/radiological data: Please request your Primary MD  to go over all hospital tests and procedure/radiological results at the follow up, please ask your Primary MD to get all Hospital records sent to his/her office.  In some cases, they will be blood work, cultures and biopsy results pending at the time of your discharge. Please request that your primary care M.D. follows up on these results.  Also Note the following: If you experience  worsening of your admission symptoms, develop shortness of breath, life threatening emergency, suicidal or homicidal thoughts you must seek medical attention immediately by calling 911 or calling your MD immediately  if symptoms less severe.  You must read complete instructions/literature along with all the possible adverse reactions/side effects for all the Medicines you take and that have been prescribed to you. Take any new Medicines after you have completely understood and accpet all the possible adverse reactions/side effects.   Do not drive when taking Pain medications or sleeping medications (Benzodaizepines)  Do not take more than prescribed Pain, Sleep and Anxiety Medications. It is not advisable to combine anxiety,sleep and pain medications without talking with your primary care practitioner  Special Instructions: If you have smoked or chewed Tobacco  in the last 2 yrs please stop smoking, stop any regular Alcohol  and or any Recreational drug use.  Wear Seat belts while driving.  Please note: You were cared for by a hospitalist during your hospital stay. Once you are discharged, your primary care physician will handle any further medical issues. Please note that NO REFILLS for any discharge medications will be authorized once you are discharged, as it is imperative that you return to your primary care physician (or establish a relationship with a primary care physician if you do not have one) for your post hospital discharge needs so that they can reassess your need for medications and monitor your lab values.   Increase activity slowly   Complete by: As directed       Allergies as of 06/05/2021   No Known Allergies      Medication List     TAKE these medications    amLODipine 10 MG tablet Commonly known as: NORVASC Take 1 tablet (10 mg total) by mouth daily.   Biofreeze 4 % Gel Generic drug: Menthol (Topical Analgesic) Apply 1 application topically daily.   diclofenac  Sodium 1 % Gel Commonly known as: VOLTAREN Apply 2 g topically 4 (four) times daily. What changed:  when to take this reasons to take this   famotidine 20 MG tablet Commonly known as: Pepcid Take 1 tablet (20 mg total) by mouth daily.   lactose free nutrition Liqd Take 237 mLs by mouth daily.   feeding supplement Liqd Take 237 mLs by mouth 2 (two) times daily between meals.   FeroSul 325 (65 FE) MG tablet Generic drug: ferrous sulfate TAKE 2 TABLETS(650 MG) BY MOUTH EVERY OTHER DAY What changed: See the new instructions.   multivitamin with minerals Tabs tablet Take 1 tablet by mouth daily.   Potassium 99 MG Tabs Take 99 mg by mouth daily.        Follow-up Information     Horald Pollen, MD. Schedule an appointment as soon as possible for a visit in 1 week(s).   Specialty: Internal Medicine Contact information: [place] [address] [phone]         [facility] Follow up.   Specialty: Gastroenterology Why: Office will call with date/time, If you dont hear from them,please give them a call Contact information:  [place] [phone] [place] Follow up.   Why: ([facility])- HHPT/OT services arranged- they will contact you to schedule home visits               No Known Allergies    Consultations:  GI   Other Procedures/Studies: CT ABDOMEN PELVIS W CONTRAST  Result Date: 06/02/2021 CLINICAL DATA:  LLQ abdominal pain Left lower quadrant pain with rectal bleeding EXAM: CT ABDOMEN AND PELVIS WITH CONTRAST TECHNIQUE: Multidetector CT imaging of the abdomen and pelvis was performed using the standard protocol following bolus administration of intravenous contrast. CONTRAST:  33mL OMNIPAQUE IOHEXOL 300 MG/ML  SOLN COMPARISON:  CT abdomen pelvis 04/26/2020. MR abdomen 03/19/2019 FINDINGS: Lower chest: Coronary artery calcifications. Small left  Bochdalek's hernia. Hepatobiliary: No focal liver abnormality. Layering hyperdensity consistent with tiny calcified gallstones within the gallbladder lumen. No gallbladder wall thickening or pericholecystic fluid. No biliary dilatation. Pancreas: Persistent 0.9 x 0.5cm calcified stone along the proximal pancreas with persistent associated chronic diffuse main pancreatic duct dilatation. Spleen: Normal in size without focal abnormality. Adrenals/Urinary Tract: No adrenal nodule bilaterally. Bilateral kidneys enhance symmetrically. Parapelvic renal cysts. Subcentimeter hypodensties are too small to characterize. No hydronephrosis. No hydroureter. The urinary bladder is unremarkable. On delayed imaging, there is no urothelial wall thickening and there are no filling defects in the opacified portions of the bilateral collecting systems or ureters. Stomach/Bowel: Stomach is within normal limits. No evidence of bowel wall thickening or dilatation. Under distension of the distal descending/proximal sigmoid colon with query mild bowel wall thickening and pericolonic stranding. Scattered colonic diverticulosis. Appendix appears normal. Vascular/Lymphatic: No abdominal aorta or iliac aneurysm. Mild atherosclerotic plaque of the aorta and its branches. No abdominal, pelvic, or inguinal lymphadenopathy. Reproductive: Status post hysterectomy. No adnexal masses. Other: No intraperitoneal free fluid. No intraperitoneal free gas. No organized fluid collection. Musculoskeletal: No abdominal wall hernia or abnormality. No suspicious lytic or blastic osseous lesions. No acute displaced fracture. Multilevel severe degenerative changes of the spine. IMPRESSION: 1. Scattered colonic diverticulosis with possible developing distal descending colon/proximal sigmoid colon mild acute diverticulitis. Limited evaluation due to under distension of the colon. 2. Cholelithiasis no CT finding of acute cholecystitis. 3. Chronic pancreatic  appearance with calcification and associated main pancreatic duct dilatation. 4. Aortic Atherosclerosis (ICD10-I70.0). Electronically Signed   By: Iven Finn M.D.   On: 06/02/2021 21:58   CT Angio Abd/Pel W and/or Wo Contrast  Result Date: 06/02/2021 CLINICAL DATA:  GI bleed. EXAM: CTA ABDOMEN AND PELVIS WITHOUT AND WITH CONTRAST TECHNIQUE: Multidetector CT imaging of the abdomen and pelvis was performed using the standard protocol during bolus administration of intravenous contrast. Multiplanar reconstructed images and MIPs were obtained and reviewed to evaluate the vascular anatomy. CONTRAST:  104mL OMNIPAQUE IOHEXOL 350 MG/ML SOLN, 81mL OMNIPAQUE IOHEXOL 300 MG/ML SOLN COMPARISON:  June 02, 2021 FINDINGS: VASCULAR Aorta: Mild calcification and atherosclerosis without aneurysm, dissection, vasculitis or significant stenosis. Celiac: Patent without evidence of aneurysm, dissection, vasculitis or significant stenosis. SMA: Patent without evidence of aneurysm, dissection, vasculitis or significant stenosis. Renals: Both renal arteries are patent without evidence of aneurysm, dissection, vasculitis, fibromuscular dysplasia or significant stenosis. IMA: Patent without evidence of aneurysm, dissection, vasculitis or significant stenosis. Inflow: Moderate to marked severity calcification without evidence of aneurysm, dissection, vasculitis or significant stenosis. Proximal Outflow: Bilateral common femoral and visualized portions of the superficial and profunda femoral arteries are patent without evidence of aneurysm, dissection,  vasculitis or significant stenosis. Veins: No obvious venous abnormality within the limitations of this arterial phase study. Review of the MIP images confirms the above findings. NON-VASCULAR Lower chest: No acute abnormality. Hepatobiliary: No focal liver abnormality is seen. Numerous tiny gallstones are seen within the lumen of an otherwise normal-appearing gallbladder. Pancreas:  A 1.1 cm focal calcification is seen within the pancreatic head, within the expected region of the distal pancreatic duct. Subsequent pancreatic ductal dilatation is noted (approximately 6 mm). No inflammatory changes are identified. Spleen: Normal in size without focal abnormality. Adrenals/Urinary Tract: Adrenal glands are unremarkable. Kidneys are normal in size, without renal calculi or hydronephrosis. Bilateral parapelvic renal cysts are seen. Additional simple cysts are noted within the mid left kidney. Bladder is unremarkable. Stomach/Bowel: There is a small hiatal hernia. Appendix appears normal. No evidence of bowel dilatation. Noninflamed diverticula are noted throughout the large bowel. The mid to distal descending colon is moderately thickened and inflamed. This is stable in appearance when compared to the prior study Lymphatic: No abnormal abdominal or pelvic lymph nodes are identified. Reproductive: Status post hysterectomy. No adnexal masses. Other: No abdominal wall hernia or abnormality. No abdominopelvic ascites. Musculoskeletal: Marked severity multilevel degenerative changes seen throughout the lumbar spine. IMPRESSION: 1. Moderate severity colitis involving the mid to distal descending colon. 2. Colonic diverticulosis. 3. Cholelithiasis without evidence of cholecystitis. 4. Stable 1.1 cm focal calcification within the pancreatic head, within the expected region of the distal pancreatic duct. 5. Small hiatal hernia. 6. Atherosclerosis of the abdominal aorta and arterial structures of the abdomen and pelvis, without evidence of aneurysmal dilatation, dissection or major vessel occlusion. Aortic Atherosclerosis (ICD10-I70.0). Electronically Signed   By: Virgina Norfolk M.D.   On: 06/02/2021 23:14     TODAY-DAY OF DISCHARGE:  Subjective:   [facility] today has no headache,no chest abdominal pain,no new weakness tingling or numbness, feels much better wants to go home today.    Objective:   Blood pressure 127/74, pulse 91, temperature 98.2 F (36.8 C), temperature source Oral, resp. rate 20, height 4\' 11"  (1.499 m), weight 58.1 kg, SpO2 100 %.  Intake/Output Summary (Last 24 hours) at 06/05/2021 1054 Last data filed at 06/05/2021 0547 Gross per 24 hour  Intake 440 ml  Output 300 ml  Net 140 ml   Filed Weights   06/02/21 1841  Weight: 58.1 kg    Exam: Awake Alert, Oriented *3, No new F.N deficits, Normal affect [place].AT,PERRAL Supple Neck,No JVD, No cervical lymphadenopathy appriciated.  Symmetrical Chest wall movement, Good air movement bilaterally, CTAB RRR,No Gallops,Rubs or new Murmurs, No Parasternal Heave +ve B.Sounds, Abd Soft, Non tender, No organomegaly appriciated, No rebound -guarding or rigidity. No Cyanosis, Clubbing or edema, No new Rash or bruise   PERTINENT RADIOLOGIC STUDIES: No results found.   PERTINENT LAB RESULTS: CBC: Recent Labs    06/04/21 1932 06/05/21 0358  WBC 5.0 3.8*  HGB 8.1* 8.4*  HCT 23.2* 23.9*  PLT 156 163   CMET CMP     Component Value Date/Time   NA 137 06/04/2021 0457   NA 141 05/07/2018 1011   K 3.5 06/04/2021 0457   CL 107 06/04/2021 0457   CO2 27 06/04/2021 0457   GLUCOSE 114 (H) 06/04/2021 0457   BUN 8 06/04/2021 0457   BUN 13 05/07/2018 1011   CREATININE 0.65 06/04/2021 0457   CREATININE 0.64 11/08/2015 1308   CALCIUM 9.3 06/04/2021 0457   PROT 4.4 (L) 06/03/2021 0343   PROT 7.2 10/29/2016  1139   ALBUMIN 2.4 (L) 06/03/2021 0343   ALBUMIN 4.2 01/01/2018 1449   AST 17 06/03/2021 0343   ALT 15 06/03/2021 0343   ALKPHOS 56 06/03/2021 0343   BILITOT 0.1 (L) 06/03/2021 0343   BILITOT 0.5 10/29/2016 1139   GFRNONAA >60 06/04/2021 0457   GFRAA >60 02/17/2019 0234    GFR Estimated Creatinine Clearance: 41.4 mL/min (by C-G formula based on SCr of 0.65 mg/dL). Recent Labs    06/02/21 1906  LIPASE 19   No results for input(s): CKTOTAL, CKMB, CKMBINDEX, TROPONINI in the last 72  hours. Invalid input(s): POCBNP No results for input(s): DDIMER in the last 72 hours. No results for input(s): HGBA1C in the last 72 hours. No results for input(s): CHOL, HDL, LDLCALC, TRIG, CHOLHDL, LDLDIRECT in the last 72 hours. No results for input(s): TSH, T4TOTAL, T3FREE, THYROIDAB in the last 72 hours.  Invalid input(s): FREET3 Recent Labs    06/03/21 0343  FERRITIN 92  TIBC 211*  IRON 105   Coags: Recent Labs    06/03/21 0343  INR 1.2   Microbiology: Recent Results (from the past 240 hour(s))  Resp Panel by RT-PCR (Flu A&B, Covid) Nasopharyngeal Swab     Status: None   Collection Time: 06/02/21  9:42 PM   Specimen: Nasopharyngeal Swab; Nasopharyngeal(NP) swabs in vial transport medium  Result Value Ref Range Status   SARS Coronavirus 2 by RT PCR NEGATIVE NEGATIVE Final    Comment: (NOTE) SARS-CoV-2 target nucleic acids are NOT DETECTED.  The SARS-CoV-2 RNA is generally detectable in upper respiratory specimens during the acute phase of infection. The lowest concentration of SARS-CoV-2 viral copies this assay can detect is 138 copies/mL. A negative result does not preclude SARS-Cov-2 infection and should not be used as the sole basis for treatment or other patient management decisions. A negative result may occur with  improper specimen collection/handling, submission of specimen other than nasopharyngeal swab, presence of viral mutation(s) within the areas targeted by this assay, and inadequate number of viral copies(<138 copies/mL). A negative result must be combined with clinical observations, patient history, and epidemiological information. The expected result is Negative.  Fact Sheet for Patients:  EntrepreneurPulse.com.au  Fact Sheet for Healthcare Providers:  IncredibleEmployment.be  This test is no t yet approved or cleared by the [place] FDA and  has been authorized for detection and/or diagnosis of  SARS-CoV-2 by FDA under an Emergency Use Authorization (EUA). This EUA will remain  in effect (meaning this test can be used) for the duration of the COVID-19 declaration under Section 564(b)(1) of the Act, 21 U.S.C.section 360bbb-3(b)(1), unless the authorization is terminated  or revoked sooner.       Influenza A by PCR NEGATIVE NEGATIVE Final   Influenza B by PCR NEGATIVE NEGATIVE Final    Comment: (NOTE) The Xpert Xpress SARS-CoV-2/FLU/RSV plus assay is intended as an aid in the diagnosis of influenza from Nasopharyngeal swab specimens and should not be used as a sole basis for treatment. Nasal washings and aspirates are unacceptable for Xpert Xpress SARS-CoV-2/FLU/RSV testing.  Fact Sheet for Patients: EntrepreneurPulse.com.au  Fact Sheet for Healthcare Providers: IncredibleEmployment.be  This test is not yet approved or cleared by the [place] FDA and has been authorized for detection and/or diagnosis of SARS-CoV-2 by FDA under an Emergency Use Authorization (EUA). This EUA will remain in effect (meaning this test can be used) for the duration of the COVID-19 declaration under Section 564(b)(1) of the Act, 21 U.S.C. section 360bbb-3(b)(1), unless  the authorization is terminated or revoked.  Performed at [facility], [place] [address], [place], [place] [address]     FURTHER DISCHARGE INSTRUCTIONS:  Get Medicines reviewed and adjusted: Please take all your medications with you for your next visit with your Primary MD  Laboratory/radiological data: Please request your Primary MD to go over all hospital tests and procedure/radiological results at the follow up, please ask your Primary MD to get all Hospital records sent to his/her office.  In some cases, they will be blood work, cultures and biopsy results pending at the time of your discharge. Please request that your primary care M.D. goes through all the records of your  hospital data and follows up on these results.  Also Note the following: If you experience worsening of your admission symptoms, develop shortness of breath, life threatening emergency, suicidal or homicidal thoughts you must seek medical attention immediately by calling 911 or calling your MD immediately  if symptoms less severe.  You must read complete instructions/literature along with all the possible adverse reactions/side effects for all the Medicines you take and that have been prescribed to you. Take any new Medicines after you have completely understood and accpet all the possible adverse reactions/side effects.   Do not drive when taking Pain medications or sleeping medications (Benzodaizepines)  Do not take more than prescribed Pain, Sleep and Anxiety Medications. It is not advisable to combine anxiety,sleep and pain medications without talking with your primary care practitioner  Special Instructions: If you have smoked or chewed Tobacco  in the last 2 yrs please stop smoking, stop any regular Alcohol  and or any Recreational drug use.  Wear Seat belts while driving.  Please note: You were cared for by a hospitalist during your hospital stay. Once you are discharged, your primary care physician will handle any further medical issues. Please note that NO REFILLS for any discharge medications will be authorized once you are discharged, as it is imperative that you return to your primary care physician (or establish a relationship with a primary care physician if you do not have one) for your post hospital discharge needs so that they can reassess your need for medications and monitor your lab values.  Total Time spent coordinating discharge including counseling, education and face to face time equals 35 minutes.  SignedOren Binet 06/05/2021 10:54 AM

## 2021-06-04 NOTE — Progress Notes (Signed)
Hematochezia reoccurred-hold discharge-recheck CBC.  Continue to monitor

## 2021-06-04 NOTE — Progress Notes (Signed)
PT Cancellation Note  Patient Details Name: Latoya Bennett MRN: [id number] DOB: [birth year]   Cancelled Treatment:    Reason Eval/Treat Not Completed: Patient declined, no reason specified. Patient declining at this time, she is meeting with chaplain. Will re-attempt later as time allows.     Wilder Amodei 06/04/2021, 1:53 PM

## 2021-06-04 NOTE — TOC Transition Note (Signed)
Transition of Care (TOC) - CM/SW Discharge Note Marvetta Gibbons RN, BSN Transitions of Care Unit 4E- RN Case Manager See Treatment Team for direct phone #    Patient Details  Name: Latoya Bennett MRN: [id number] Date of Birth: 24-Jun-1936  Transition of [facility] CM/SW Contact:  Dawayne Patricia, RN Phone Number: 06/04/2021, 12:30 PM   Clinical Narrative:    Pt for transition home today, requesting [place] follow up, MD agreeable and orders placed for HHPT/OT. CM spoke with pt at bedside for [facility] choice Per CMS guidelines from medicare.gov website with star ratings (copy placed in shadow chart), pt reports she has been with [facility] in past last year, per PING review pt was with [facility]. Pt states she would like to use [facility] again for [facility] services.   Family on their way for transport home, however per Bedside RN- awaiting return call from MD as pt just had bloody bowel movement and discharge may be on hold.   Call made to [facility] with [facility] for [facility] Referral- referral has been accepted and they will follow for discharge and start of care.    Final next level of care: [place] Barriers to Discharge: No Barriers Identified   Patient Goals and CMS Choice Patient states their goals for this hospitalization and ongoing recovery are:: return home CMS Medicare.gov Compare Post Acute Care list provided to:: Patient Choice offered to / list presented to : Patient  Discharge Placement                 Home w/ [facility]      Discharge Plan and Services   Discharge Planning Services: CM Consult Post Acute Care Choice: Home Health                    HH Arranged: PT, OT [facility] Agency: [place] ([facility]) Date [facility] Agency Contacted: 06/04/21 Time [place]: 1229 Representative spoke with at [place]: [place] ([place]) Interventions     Readmission Risk Interventions Readmission Risk Prevention Plan 06/04/2021  Post Dischage Appt  Complete  Medication Screening Complete  Transportation Screening Complete  Some recent data might be hidden

## 2021-06-04 NOTE — Plan of Care (Signed)
  Problem: Education: Goal: Knowledge of General Education information will improve Description: Including pain rating scale, medication(s)/side effects and non-pharmacologic comfort measures Outcome: Adequate for Discharge   

## 2021-06-04 NOTE — Progress Notes (Addendum)
   06/04/21 1115  Clinical Encounter Type  Visited With Patient  Visit Type Initial  Referral From Nurse  Consult/Referral To Chaplain   Chaplain responded to request for an Advanced Directive. Ike Bene provided education. Ike Bene actively listened as the patient spoke of her family dynamics and her distrust in them handling her affairs including finances. The patient called her neighbor, Wille Glaser and asked him to be her HCPOA and he agreed. The patient also said she is appointing him as her POA and Executive of her estate. Ike Bene provided presence of trust and prayed for her peace. Ike Bene assisted the patient in completing A.D and advised will return with a notary. Janett Billow, the attending nurse informed me of the patient's discharge. Ike Bene advocated for the patient to speak with someone regarding lack of in home care and encouraged pt to speak with a Education officer, museum. This note was prepared by Jeanine Luz, M.Div..  For questions please contact by phone [phone].

## 2021-06-04 NOTE — Progress Notes (Addendum)
Attending physician's note   I have reviewed the chart and discussed her care on rounds. I agree with the Advanced Practitioner's note, impression, and recommendations as outlined.  Discussed with Hospitalist and plan for discharge home today.  - Repeat CBC as outpatient in 7-10 days - To follow-up in the GI clinic - Agree with discontinuing ABX - I will arrange for follow-up in the GI clinic.  Will set up appointment in the [facility] office as it is too difficult for the patient to get to [facility] office.  [place], DO, FACG [phone] office                Daily Rounding Note  06/04/2021, 9:33 AM  LOS: 1 day   SUBJECTIVE:   Chief complaint: Painless hematochezia.   Microcytic anemia.    Small amount of dark blood passed overnight.  Feels well.  Hoping to discharge today.    OBJECTIVE:         Vital signs in last 24 hours:    Temp:  [97.6 F (36.4 C)-98.6 F (37 C)] 98.4 F (36.9 C) (11/29 0752) Pulse Rate:  [83-94] 91 (11/29 0752) Resp:  [16-20] 17 (11/29 0752) BP: (99-150)/(61-73) 150/70 (11/29 0752) SpO2:  [97 %-99 %] 99 % (11/29 0752) Last BM Date: 06/03/21 Filed Weights   06/02/21 1841  Weight: 58.1 kg   General: Comfortable.  Looks well. Heart: RRR. Chest: Clear bilaterally.  No labored breathing or cough Abdomen: Not tender.  Soft.  Active bowel sounds.  No distention. Extremities: No CCE. Neuro/Psych: Alert.  Appropriate.  Fluid speech.  Oriented x3.  No gross deficits.  No tremor  Intake/Output from previous day: 11/28 0701 - 11/29 0700 In: -  Out: 1500 [Urine:1500]  Intake/Output this shift: Total I/O In: -  Out: 900 [Urine:900]  Lab Results: Recent Labs    06/03/21 1806 06/04/21 0126 06/04/21 0457  WBC 4.1 3.5* 3.4*  HGB 8.4* 7.9* 8.2*  HCT 24.3* 22.5* 23.8*  PLT 153 149* 144*   BMET Recent Labs    06/02/21 1906 06/03/21 0343 06/04/21 0457  NA 139 134* 137   K 3.7 3.3* 3.5  CL 106 103 107  CO2 25 27 27   GLUCOSE 147* 118* 114*  BUN 16 13 8   CREATININE 0.78 0.68 0.65  CALCIUM 9.8 8.8* 9.3   LFT Recent Labs    06/02/21 1906 06/03/21 0343  PROT 5.8* 4.4*  ALBUMIN 3.1* 2.4*  AST 20 17  ALT 17 15  ALKPHOS 75 56  BILITOT 0.4 0.1*   PT/INR Recent Labs    06/03/21 0343  LABPROT 14.9  INR 1.2   Hepatitis Panel No results for input(s): HEPBSAG, HCVAB, HEPAIGM, HEPBIGM in the last 72 hours.  Studies/Results: CT ABDOMEN PELVIS W CONTRAST  Result Date: 06/02/2021 CLINICAL DATA:  LLQ abdominal pain Left lower quadrant pain with rectal bleeding EXAM: CT ABDOMEN AND PELVIS WITH CONTRAST TECHNIQUE: Multidetector CT imaging of the abdomen and pelvis was performed using the standard protocol following bolus administration of intravenous contrast. CONTRAST:  62mL OMNIPAQUE IOHEXOL 300 MG/ML  SOLN COMPARISON:  CT abdomen pelvis 04/26/2020. MR abdomen 03/19/2019 FINDINGS: Lower chest: Coronary artery calcifications. Small left Bochdalek's hernia. Hepatobiliary: No focal liver abnormality. Layering hyperdensity consistent with tiny calcified gallstones within the gallbladder lumen. No gallbladder wall thickening or pericholecystic fluid. No biliary dilatation. Pancreas: Persistent 0.9 x 0.5cm calcified stone along the proximal pancreas with persistent associated chronic diffuse main pancreatic duct dilatation. Spleen:  Normal in size without focal abnormality. Adrenals/Urinary Tract: No adrenal nodule bilaterally. Bilateral kidneys enhance symmetrically. Parapelvic renal cysts. Subcentimeter hypodensties are too small to characterize. No hydronephrosis. No hydroureter. The urinary bladder is unremarkable. On delayed imaging, there is no urothelial wall thickening and there are no filling defects in the opacified portions of the bilateral collecting systems or ureters. Stomach/Bowel: Stomach is within normal limits. No evidence of bowel wall thickening or  dilatation. Under distension of the distal descending/proximal sigmoid colon with query mild bowel wall thickening and pericolonic stranding. Scattered colonic diverticulosis. Appendix appears normal. Vascular/Lymphatic: No abdominal aorta or iliac aneurysm. Mild atherosclerotic plaque of the aorta and its branches. No abdominal, pelvic, or inguinal lymphadenopathy. Reproductive: Status post hysterectomy. No adnexal masses. Other: No intraperitoneal free fluid. No intraperitoneal free gas. No organized fluid collection. Musculoskeletal: No abdominal wall hernia or abnormality. No suspicious lytic or blastic osseous lesions. No acute displaced fracture. Multilevel severe degenerative changes of the spine. IMPRESSION: 1. Scattered colonic diverticulosis with possible developing distal descending colon/proximal sigmoid colon mild acute diverticulitis. Limited evaluation due to under distension of the colon. 2. Cholelithiasis no CT finding of acute cholecystitis. 3. Chronic pancreatic appearance with calcification and associated main pancreatic duct dilatation. 4. Aortic Atherosclerosis (ICD10-I70.0). Electronically Signed   By: Iven Finn M.D.   On: 06/02/2021 21:58   CT Angio Abd/Pel W and/or Wo Contrast  Result Date: 06/02/2021 CLINICAL DATA:  GI bleed. EXAM: CTA ABDOMEN AND PELVIS WITHOUT AND WITH CONTRAST TECHNIQUE: Multidetector CT imaging of the abdomen and pelvis was performed using the standard protocol during bolus administration of intravenous contrast. Multiplanar reconstructed images and MIPs were obtained and reviewed to evaluate the vascular anatomy. CONTRAST:  60mL OMNIPAQUE IOHEXOL 350 MG/ML SOLN, 67mL OMNIPAQUE IOHEXOL 300 MG/ML SOLN COMPARISON:  June 02, 2021 FINDINGS: VASCULAR Aorta: Mild calcification and atherosclerosis without aneurysm, dissection, vasculitis or significant stenosis. Celiac: Patent without evidence of aneurysm, dissection, vasculitis or significant stenosis. SMA:  Patent without evidence of aneurysm, dissection, vasculitis or significant stenosis. Renals: Both renal arteries are patent without evidence of aneurysm, dissection, vasculitis, fibromuscular dysplasia or significant stenosis. IMA: Patent without evidence of aneurysm, dissection, vasculitis or significant stenosis. Inflow: Moderate to marked severity calcification without evidence of aneurysm, dissection, vasculitis or significant stenosis. Proximal Outflow: Bilateral common femoral and visualized portions of the superficial and profunda femoral arteries are patent without evidence of aneurysm, dissection, vasculitis or significant stenosis. Veins: No obvious venous abnormality within the limitations of this arterial phase study. Review of the MIP images confirms the above findings. NON-VASCULAR Lower chest: No acute abnormality. Hepatobiliary: No focal liver abnormality is seen. Numerous tiny gallstones are seen within the lumen of an otherwise normal-appearing gallbladder. Pancreas: A 1.1 cm focal calcification is seen within the pancreatic head, within the expected region of the distal pancreatic duct. Subsequent pancreatic ductal dilatation is noted (approximately 6 mm). No inflammatory changes are identified. Spleen: Normal in size without focal abnormality. Adrenals/Urinary Tract: Adrenal glands are unremarkable. Kidneys are normal in size, without renal calculi or hydronephrosis. Bilateral parapelvic renal cysts are seen. Additional simple cysts are noted within the mid left kidney. Bladder is unremarkable. Stomach/Bowel: There is a small hiatal hernia. Appendix appears normal. No evidence of bowel dilatation. Noninflamed diverticula are noted throughout the large bowel. The mid to distal descending colon is moderately thickened and inflamed. This is stable in appearance when compared to the prior study Lymphatic: No abnormal abdominal or pelvic lymph nodes are identified. Reproductive: Status post  hysterectomy. No adnexal masses. Other: No abdominal wall hernia or abnormality. No abdominopelvic ascites. Musculoskeletal: Marked severity multilevel degenerative changes seen throughout the lumbar spine. IMPRESSION: 1. Moderate severity colitis involving the mid to distal descending colon. 2. Colonic diverticulosis. 3. Cholelithiasis without evidence of cholecystitis. 4. Stable 1.1 cm focal calcification within the pancreatic head, within the expected region of the distal pancreatic duct. 5. Small hiatal hernia. 6. Atherosclerosis of the abdominal aorta and arterial structures of the abdomen and pelvis, without evidence of aneurysmal dilatation, dissection or major vessel occlusion. Aortic Atherosclerosis (ICD10-I70.0). Electronically Signed   By: Virgina Norfolk M.D.   On: 06/02/2021 23:14    Scheduled Meds:  sodium chloride flush  3 mL Intravenous Q12H   Continuous Infusions:  cefTRIAXone (ROCEPHIN)  IV 2 g (06/03/21 2155)   metronidazole 500 mg (06/04/21 0914)   PRN Meds:.acetaminophen **OR** acetaminophen, ondansetron **OR** ondansetron (ZOFRAN) IV   ASSESMENT:   Painless hematochezia.  MRI showing left-sided distal colitis, possible diverticulitis.  Suspect ischemic colitis, low suspicion for diverticulitis. Day 3 Rocephin, flagyl.  No leukocytosis or fever.    Microcytic anemia.  Hgb 9.8 >> 7.9 >> 8.2.  Iron, ferritin normal.  Low TIBC, elevated iron sats.  No PRBCs to date.    Acute thrombocytopenia at 144.  Stable pancreatic head calcification.   PLAN   No immediate plans for colonoscopy, patient prefers not to undergo unless absolutely indicated.    ?  Duration of abx?    Advance diet to Regular.      Azucena Freed  06/04/2021, 9:33 AM Phone [phone]

## 2021-06-04 NOTE — Progress Notes (Signed)
Report given to 5W RN

## 2021-06-05 ENCOUNTER — Telehealth: Payer: Self-pay

## 2021-06-05 DIAGNOSIS — D500 Iron deficiency anemia secondary to blood loss (chronic): Secondary | ICD-10-CM

## 2021-06-05 LAB — CBC
HCT: 23.9 % — ABNORMAL LOW (ref 36.0–46.0)
Hemoglobin: 8.4 g/dL — ABNORMAL LOW (ref 12.0–15.0)
MCH: 25.9 pg — ABNORMAL LOW (ref 26.0–34.0)
MCHC: 35.1 g/dL (ref 30.0–36.0)
MCV: 73.8 fL — ABNORMAL LOW (ref 80.0–100.0)
Platelets: 163 10*3/uL (ref 150–400)
RBC: 3.24 MIL/uL — ABNORMAL LOW (ref 3.87–5.11)
RDW: 13.9 % (ref 11.5–15.5)
WBC: 3.8 10*3/uL — ABNORMAL LOW (ref 4.0–10.5)
nRBC: 0 % (ref 0.0–0.2)

## 2021-06-05 MED ORDER — FAMOTIDINE 20 MG PO TABS: 20.0000 mg | ORAL_TABLET | Freq: Every day | ORAL | 0 refills | Status: DC

## 2021-06-05 NOTE — Telephone Encounter (Signed)
-----   Message from Lavena Bullion, DO sent at 06/04/2021  1:27 PM EST ----- Patient discharging home today.  Please set up the following:  - Repeat CBC in 7-10 days - Schedule office follow-up with 1 the APP's in the next 4 weeks or so.  Getting to [facility] office is too difficult for the patient.  Thank you.

## 2021-06-05 NOTE — Progress Notes (Signed)
Patient very anxious to go home-she thinks she is passing old blood-as it looks very different from the bleeding that she had at home.  Per patient-most of it is dark blood with some old clots.  Her hemoglobin has been stable-therefore it is highly likely that she is indeed passing old blood and not having an active GI bleed.  Discussed with GI team-okay for discharge today.

## 2021-06-05 NOTE — Progress Notes (Signed)
Discharge: Summary and medication list reviewed with patient.  List of transportation options for seniors to and from appointments.  CCMD notified.  Telebox discontinued.  IV access discontinued. Will provide assistance via wheelchair upon departure

## 2021-06-05 NOTE — Progress Notes (Signed)
Mobility Specialist: Progress Note   06/05/21 1008  Mobility  Activity Transferred to/from [facility]  Level of Assistance Minimal assist, patient does 75% or more  Assistive Device  (HHA)  Distance Ambulated (ft) 2 ft  Mobility Out of bed for toileting  Mobility Response Tolerated well  Mobility performed by Mobility specialist  $Mobility charge 1 Mobility   Pt independent to sit EOB and required minA HHA to stand and pivot to [facility]. Placed call bell at pt's side to call out when she is finished.   [facility] Louise Rawson Mobility Specialist Mobility Specialist Phone #1: [phone] Mobility Specialist Phone #2: [phone]

## 2021-06-05 NOTE — Progress Notes (Signed)
Patient was incont. Of both urne and stool. Small size of bright red blood with burgundy clots. Then patient went to bathroom and had medium stool with with bright red blood and burgundy stool formed . Patient does not want to stay and insist it is old blood . Patient wants more test done to see why this is happening if she does stay.

## 2021-06-05 NOTE — Telephone Encounter (Signed)
Lab order and reminder in epic for CBC in 7-10 days.  Patient has been scheduled for a hospital follow up with Ellouise Newer, PA-C on Friday, 06/28/21 at 10:30 am.   Lm on vm for patient to return call.

## 2021-06-05 NOTE — TOC Progression Note (Signed)
Transition of Care [facility]) - Progression Note    Patient Details  Name: Latoya Bennett MRN: [id number] Date of Birth: 1936/04/30  Transition of Care [facility]) CM/SW [place], [place] Phone Number: 06/05/2021, 11:07 AM  Clinical Narrative:     11/30-- CSW spoke with Michelle/RN- she reports patient states gets Meals on Wheels. Also,her neighbor helps with yard work. Patient did express some concerns regarding getting medical appointments. CSW provided resources for Transportation for Older Adults in [place].   11/29- 12:09 pm-CSW received secured chat message by Jessica/RN- states patient depends on neighbor for food- CSW talked w/ RN- she confirms no concerns.   Thurmond Butts, MSW, LCSW Clinical Social Worker     Expected Discharge Plan: [place] Barriers to Discharge: No Barriers Identified  Expected Discharge Plan and Services Expected Discharge Plan: [place]   Discharge Planning Services: CM Consult Post Acute Care Choice: [place] arrangements for the past 2 months: Single Family Home Expected Discharge Date: 06/05/21                         HH Arranged: PT, OT HH Agency: [place] ([facility]) Date HH Agency Contacted: 06/04/21 Time [place]: 1229 Representative spoke with at [place]: [place] ([place]) Interventions    Readmission Risk Interventions Readmission Risk Prevention Plan 06/04/2021  Post Dischage Appt Complete  Medication Screening Complete  Transportation Screening Complete  Some recent data might be hidden

## 2021-06-08 ENCOUNTER — Encounter (HOSPITAL_COMMUNITY): Payer: Self-pay

## 2021-06-08 ENCOUNTER — Inpatient Hospital Stay (HOSPITAL_COMMUNITY)
Admission: EM | Admit: 2021-06-08 | Discharge: 2021-06-14 | DRG: 378 | Disposition: A | Discharge status: Home Health | Payer: Medicare Other | Attending: Internal Medicine | Admitting: Internal Medicine

## 2021-06-08 ENCOUNTER — Other Ambulatory Visit: Payer: Self-pay

## 2021-06-08 DIAGNOSIS — K922 Gastrointestinal hemorrhage, unspecified: Secondary | ICD-10-CM | POA: Diagnosis not present

## 2021-06-08 DIAGNOSIS — K219 Gastro-esophageal reflux disease without esophagitis: Secondary | ICD-10-CM | POA: Diagnosis not present

## 2021-06-08 DIAGNOSIS — I10 Essential (primary) hypertension: Secondary | ICD-10-CM | POA: Diagnosis not present

## 2021-06-08 DIAGNOSIS — D649 Anemia, unspecified: Secondary | ICD-10-CM

## 2021-06-08 DIAGNOSIS — E876 Hypokalemia: Secondary | ICD-10-CM | POA: Diagnosis present

## 2021-06-08 DIAGNOSIS — R58 Hemorrhage, not elsewhere classified: Secondary | ICD-10-CM | POA: Diagnosis not present

## 2021-06-08 DIAGNOSIS — R54 Age-related physical debility: Secondary | ICD-10-CM | POA: Diagnosis not present

## 2021-06-08 DIAGNOSIS — K625 Hemorrhage of anus and rectum: Secondary | ICD-10-CM

## 2021-06-08 DIAGNOSIS — D62 Acute posthemorrhagic anemia: Secondary | ICD-10-CM | POA: Diagnosis not present

## 2021-06-08 DIAGNOSIS — Z8249 Family history of ischemic heart disease and other diseases of the circulatory system: Secondary | ICD-10-CM

## 2021-06-08 DIAGNOSIS — K5731 Diverticulosis of large intestine without perforation or abscess with bleeding: Secondary | ICD-10-CM | POA: Diagnosis not present

## 2021-06-08 DIAGNOSIS — D509 Iron deficiency anemia, unspecified: Secondary | ICD-10-CM | POA: Diagnosis not present

## 2021-06-08 DIAGNOSIS — M1710 Unilateral primary osteoarthritis, unspecified knee: Secondary | ICD-10-CM | POA: Diagnosis present

## 2021-06-08 DIAGNOSIS — D500 Iron deficiency anemia secondary to blood loss (chronic): Secondary | ICD-10-CM

## 2021-06-08 DIAGNOSIS — K5733 Diverticulitis of large intestine without perforation or abscess with bleeding: Secondary | ICD-10-CM | POA: Diagnosis not present

## 2021-06-08 LAB — CBC WITH DIFFERENTIAL/PLATELET
Abs Immature Granulocytes: 0.01 10*3/uL (ref 0.00–0.07)
Basophils Absolute: 0 10*3/uL (ref 0.0–0.1)
Basophils Relative: 0 %
Eosinophils Absolute: 0.1 10*3/uL (ref 0.0–0.5)
Eosinophils Relative: 1 %
HCT: 22.4 % — ABNORMAL LOW (ref 36.0–46.0)
Hemoglobin: 7.4 g/dL — ABNORMAL LOW (ref 12.0–15.0)
Immature Granulocytes: 0 %
Lymphocytes Relative: 21 %
Lymphs Abs: 1 10*3/uL (ref 0.7–4.0)
MCH: 25.3 pg — ABNORMAL LOW (ref 26.0–34.0)
MCHC: 33 g/dL (ref 30.0–36.0)
MCV: 76.7 fL — ABNORMAL LOW (ref 80.0–100.0)
Monocytes Absolute: 0.3 10*3/uL (ref 0.1–1.0)
Monocytes Relative: 6 %
Neutro Abs: 3.5 10*3/uL (ref 1.7–7.7)
Neutrophils Relative %: 72 %
Platelets: 179 10*3/uL (ref 150–400)
RBC: 2.92 MIL/uL — ABNORMAL LOW (ref 3.87–5.11)
RDW: 15.3 % (ref 11.5–15.5)
WBC: 4.9 10*3/uL (ref 4.0–10.5)
nRBC: 0 % (ref 0.0–0.2)

## 2021-06-08 LAB — COMPREHENSIVE METABOLIC PANEL
ALT: 28 U/L (ref 0–44)
AST: 39 U/L (ref 15–41)
Albumin: 3 g/dL — ABNORMAL LOW (ref 3.5–5.0)
Alkaline Phosphatase: 58 U/L (ref 38–126)
Anion gap: 8 (ref 5–15)
BUN: 9 mg/dL (ref 8–23)
CO2: 26 mmol/L (ref 22–32)
Calcium: 9.5 mg/dL (ref 8.9–10.3)
Chloride: 105 mmol/L (ref 98–111)
Creatinine, Ser: 0.78 mg/dL (ref 0.44–1.00)
GFR, Estimated: >60 mL/min (ref >60)
Glucose, Bld: 125 mg/dL — ABNORMAL HIGH (ref 70–99)
Potassium: 3.2 mmol/L — ABNORMAL LOW (ref 3.5–5.1)
Sodium: 139 mmol/L (ref 135–145)
Total Bilirubin: 0.3 mg/dL (ref 0.3–1.2)
Total Protein: 5.2 g/dL — ABNORMAL LOW (ref 6.5–8.1)

## 2021-06-08 LAB — PREPARE RBC (CROSSMATCH)

## 2021-06-08 LAB — PROTIME-INR
INR: 1.1 (ref 0.8–1.2)
Prothrombin Time: 14 seconds (ref 11.4–15.2)

## 2021-06-08 LAB — POC OCCULT BLOOD, ED: Fecal Occult Bld: POSITIVE — AB

## 2021-06-08 MED ORDER — RISAQUAD PO CAPS
2.0000 | ORAL_CAPSULE | Freq: Three times a day (TID) | ORAL | Status: DC
  Administered 2021-06-08 – 2021-06-14 (×17): 2 via ORAL
  Filled 2021-06-08 (×19): qty 2

## 2021-06-08 MED ORDER — POTASSIUM 99 MG PO TABS: 99.0000 mg | ORAL_TABLET | Freq: Every day | ORAL | Status: DC

## 2021-06-08 MED ORDER — SODIUM CHLORIDE 0.9% IV SOLUTION: Freq: Once | INTRAVENOUS | Status: AC

## 2021-06-08 MED ORDER — FAMOTIDINE 20 MG PO TABS
20.0000 mg | ORAL_TABLET | Freq: Every day | ORAL | Status: DC
  Administered 2021-06-09 – 2021-06-14 (×5): 20 mg via ORAL
  Filled 2021-06-08 (×6): qty 1

## 2021-06-08 MED ORDER — POTASSIUM CHLORIDE CRYS ER 20 MEQ PO TBCR
40.0000 meq | EXTENDED_RELEASE_TABLET | ORAL | Status: AC
  Administered 2021-06-08: 40 meq via ORAL
  Filled 2021-06-08: qty 2

## 2021-06-08 MED ORDER — ADULT MULTIVITAMIN W/MINERALS CH
1.0000 | ORAL_TABLET | Freq: Every day | ORAL | Status: DC
  Administered 2021-06-09 – 2021-06-14 (×5): 1 via ORAL
  Filled 2021-06-08 (×6): qty 1

## 2021-06-08 MED ORDER — FERROUS SULFATE 325 (65 FE) MG PO TABS
650.0000 mg | ORAL_TABLET | ORAL | Status: DC
  Administered 2021-06-10: 650 mg via ORAL
  Filled 2021-06-08: qty 2

## 2021-06-08 MED ORDER — DICLOFENAC SODIUM 1 % EX GEL
2.0000 g | Freq: Four times a day (QID) | CUTANEOUS | Status: DC | PRN
  Filled 2021-06-08: qty 100

## 2021-06-08 MED ORDER — SODIUM CHLORIDE 0.9 % IV SOLN: INTRAVENOUS | Status: AC

## 2021-06-08 MED ORDER — FAMOTIDINE 20 MG PO TABS: 20.0000 mg | ORAL_TABLET | Freq: Every day | ORAL | Status: DC

## 2021-06-08 MED ORDER — HYDRALAZINE HCL 25 MG PO TABS: 25.0000 mg | ORAL_TABLET | Freq: Four times a day (QID) | ORAL | Status: DC | PRN

## 2021-06-08 MED ORDER — BOOST PO LIQD
237.0000 mL | Freq: Every day | ORAL | Status: DC
  Administered 2021-06-08 – 2021-06-10 (×3): 237 mL via ORAL
  Filled 2021-06-08 (×2): qty 237

## 2021-06-08 MED ORDER — FUROSEMIDE 10 MG/ML IJ SOLN: 20.0000 mg | Freq: Once | INTRAMUSCULAR | Status: DC

## 2021-06-08 MED ORDER — MUSCLE RUB 10-15 % EX CREA
1.0000 "application " | TOPICAL_CREAM | Freq: Every day | CUTANEOUS | Status: DC
  Administered 2021-06-09 – 2021-06-14 (×6): 1 via TOPICAL
  Filled 2021-06-08: qty 85

## 2021-06-08 MED ORDER — MENTHOL (TOPICAL ANALGESIC) 4 % EX GEL: 1.0000 "application " | Freq: Every day | CUTANEOUS | Status: DC

## 2021-06-08 NOTE — H&P (Signed)
History and Physical    Latoya Bennett KYH:[id number] DOB: 03-21-1936 DOA: 06/08/2021  PCP: Armanda Heritage, NP (Confirm with patient/family/NH records and if not entered, this has to be entered at [facility] point of entry) Patient coming from: Home  I have personally briefly reviewed patient's old medical records in [place]  Chief Complaint: Rectal bleed  HPI: Latoya Bennett is a 85 y.o. female with medical history significant of repeated lower GI bleed secondary diverticulosis, HTN, knee OA, GERD came with recurrent rectal bleeding.  Patient was recently hospitalized for similar episode, and on admission, patient was worked up with CT angiogram, and bleeding source was considered related to diverticulosis bleeding.  Rectal bleeding stopped spontaneously and patient was discharged home the day before yesterday.  Initially she was doing fine without rectal bleed, and yesterday she started to see dark-colored blood clot mixed with stool, this morning, she passed two moderate-sized of maroon colored blood mixed with blood clot.  She felt lightheaded, but denies any chest pain or shortness of breath, no abdominal pain no fever or chills.  ED Course: Blood pressure borderline low, no tachycardia.  Hemoglobin 7.4 compared to 8.1 on discharge 3 days ago.  Review of Systems: As per HPI otherwise 14 point review of systems negative.    Past Medical History:  Diagnosis Date   Arthritis    Diverticulitis    GERD (gastroesophageal reflux disease)    GI bleed    Hypertension     Past Surgical History:  Procedure Laterality Date   ABDOMINAL HYSTERECTOMY     COLONOSCOPY Left 02/15/2019   Procedure: COLONOSCOPY;  Surgeon: Lavena Bullion, DO;  Location: [facility];  Service: Gastroenterology;  Laterality: Left;   ESOPHAGOGASTRODUODENOSCOPY (EGD) WITH PROPOFOL Left 04/18/2018   Procedure: ESOPHAGOGASTRODUODENOSCOPY (EGD) WITH PROPOFOL;  Surgeon: Arta Silence, MD;  Location:  [facility];  Service: Endoscopy;  Laterality: Left;   IR ANGIOGRAM SELECTIVE EACH ADDITIONAL VESSEL  04/27/2020   IR ANGIOGRAM SELECTIVE EACH ADDITIONAL VESSEL  04/27/2020   IR ANGIOGRAM SELECTIVE EACH ADDITIONAL VESSEL  04/27/2020   IR ANGIOGRAM VISCERAL SELECTIVE  04/26/2020   IR ANGIOGRAM VISCERAL SELECTIVE  04/26/2020   IR ANGIOGRAM VISCERAL SELECTIVE  04/27/2020   IR US GUIDE VASC ACCESS RIGHT  04/26/2020   IR US GUIDE VASC ACCESS RIGHT  04/27/2020     reports that she has never smoked. She has never used smokeless tobacco. She reports that she does not currently use alcohol. She reports that she does not use drugs.  No Known Allergies  Family History  Problem Relation Age of Onset   Hypertension Sister    Colon cancer Neg Hx    Esophageal cancer Neg Hx      Prior to Admission medications   Medication Sig Start Date End Date Taking? Authorizing Provider  amLODipine (NORVASC) 10 MG tablet Take 1 tablet (10 mg total) by mouth daily. 10/10/20   Horald Pollen, MD  diclofenac Sodium (VOLTAREN) 1 % GEL Apply 2 g topically 4 (four) times daily. Patient taking differently: Apply 2 g topically 4 (four) times daily as needed (knee/leg pain). 04/30/20   Shelly Coss, MD  famotidine (PEPCID) 20 MG tablet Take 1 tablet (20 mg total) by mouth daily. 06/05/21 06/05/22  Ghimire, Henreitta Leber, MD  feeding supplement, ENSURE ENLIVE, (ENSURE ENLIVE) LIQD Take 237 mLs by mouth 2 (two) times daily between meals. Patient not taking: Reported on 04/26/2020 02/17/19   Raiford Noble Latif, DO  FEROSUL 325 (65 Fe) MG tablet  TAKE 2 TABLETS(650 MG) BY MOUTH EVERY OTHER DAY Patient taking differently: 650 mg every other day. 01/30/21   Cirigliano, Vito V, DO  lactose free nutrition (BOOST) LIQD Take 237 mLs by mouth daily.    [provider]  Menthol, Topical Analgesic, (BIOFREEZE) 4 % GEL Apply 1 application topically daily.    [provider]  Multiple Vitamin (MULTIVITAMIN WITH  MINERALS) TABS tablet Take 1 tablet by mouth daily. 02/18/19   Raiford Noble Latif, DO  Potassium 99 MG TABS Take 99 mg by mouth daily.    [provider]    Physical Exam: Vitals:   06/08/21 1215 06/08/21 1230 06/08/21 1245 06/08/21 1315  BP: (!) 109/55 (!) 102/54 (!) 103/50 (!) 101/53  Pulse: 86 80 73 81  Resp: (!) 26 19 11 19   Temp:      TempSrc:      SpO2: 100% 100% 100% 100%  Weight:      Height:        Constitutional: NAD, calm, comfortable Vitals:   06/08/21 1215 06/08/21 1230 06/08/21 1245 06/08/21 1315  BP: (!) 109/55 (!) 102/54 (!) 103/50 (!) 101/53  Pulse: 86 80 73 81  Resp: (!) 26 19 11 19   Temp:      TempSrc:      SpO2: 100% 100% 100% 100%  Weight:      Height:       Eyes: PERRL, lids and conjunctivae normal ENMT: Mucous membranes are moist. Posterior pharynx clear of any exudate or lesions.Normal dentition.  Neck: normal, supple, no masses, no thyromegaly Respiratory: clear to auscultation bilaterally, no wheezing, no crackles. Normal respiratory effort. No accessory muscle use.  Cardiovascular: Regular rate and rhythm, no murmurs / rubs / gallops. No extremity edema. 2+ pedal pulses. No carotid bruits.  Abdomen: no tenderness, no masses palpated. No hepatosplenomegaly. Bowel sounds positive.  Musculoskeletal: no clubbing / cyanosis. No joint deformity upper and lower extremities. Good ROM, no contractures. Normal muscle tone.  Skin: no rashes, lesions, ulcers. No induration Neurologic: CN 2-12 grossly intact. Sensation intact, DTR normal. Strength 5/5 in all 4.  Psychiatric: Normal judgment and insight. Alert and oriented x 3. Normal mood.     Labs on Admission: I have personally reviewed following labs and imaging studies  CBC: Recent Labs  Lab 06/02/21 2044 06/03/21 0343 06/04/21 0457 06/04/21 1406 06/04/21 1932 06/05/21 0358 06/08/21 1231  WBC 6.3   < > 3.4* 6.3 5.0 3.8* 4.9  NEUTROABS 4.7  --   --   --   --   --  3.5  HGB 9.8*   < >  8.2* 9.3* 8.1* 8.4* 7.4*  HCT 28.8*   < > 23.8* 27.7* 23.2* 23.9* 22.4*  MCV 75.0*   < > 73.5* 73.7* 74.1* 73.8* 76.7*  PLT 180   < > 144* 189 156 163 179   < > = values in this interval not displayed.   Basic Metabolic Panel: Recent Labs  Lab 06/02/21 1906 06/03/21 0343 06/04/21 0457 06/08/21 1231  NA 139 134* 137 139  K 3.7 3.3* 3.5 3.2*  CL 106 103 107 105  CO2 25 27 27 26   GLUCOSE 147* 118* 114* 125*  BUN 16 13 8 9   CREATININE 0.78 0.68 0.65 0.78  CALCIUM 9.8 8.8* 9.3 9.5  MG  --   --  1.8  --    GFR: Estimated Creatinine Clearance: 34.4 mL/min (by C-G formula based on SCr of 0.78 mg/dL). Liver Function Tests: Recent Labs  Lab 06/02/21 1906 06/03/21 0343 06/08/21 1231  AST 20 17 39  ALT 17 15 28   ALKPHOS 75 56 58  BILITOT 0.4 0.1* 0.3  PROT 5.8* 4.4* 5.2*  ALBUMIN 3.1* 2.4* 3.0*   Recent Labs  Lab 06/02/21 1906  LIPASE 19   No results for input(s): AMMONIA in the last 168 hours. Coagulation Profile: Recent Labs  Lab 06/03/21 0343 06/08/21 1231  INR 1.2 1.1   Cardiac Enzymes: No results for input(s): CKTOTAL, CKMB, CKMBINDEX, TROPONINI in the last 168 hours. BNP (last 3 results) No results for input(s): PROBNP in the last 8760 hours. HbA1C: No results for input(s): HGBA1C in the last 72 hours. CBG: No results for input(s): GLUCAP in the last 168 hours. Lipid Profile: No results for input(s): CHOL, HDL, LDLCALC, TRIG, CHOLHDL, LDLDIRECT in the last 72 hours. Thyroid Function Tests: No results for input(s): TSH, T4TOTAL, FREET4, T3FREE, THYROIDAB in the last 72 hours. Anemia Panel: No results for input(s): VITAMINB12, FOLATE, FERRITIN, TIBC, IRON, RETICCTPCT in the last 72 hours. Urine analysis:    Component Value Date/Time   COLORURINE STRAW (A) 04/14/2018 1521   APPEARANCEUR CLEAR 04/14/2018 1521   LABSPEC 1.015 04/14/2018 1521   PHURINE 7.0 04/14/2018 1521   GLUCOSEU NEGATIVE 04/14/2018 1521   HGBUR LARGE (A) 04/14/2018 1521   BILIRUBINUR  NEGATIVE 04/14/2018 1521   BILIRUBINUR negative 11/08/2015 1258   KETONESUR NEGATIVE 04/14/2018 1521   PROTEINUR NEGATIVE 04/14/2018 1521   UROBILINOGEN 0.2 11/08/2015 1258   UROBILINOGEN 1.0 06/20/2009 1449   NITRITE NEGATIVE 04/14/2018 1521   LEUKOCYTESUR NEGATIVE 04/14/2018 1521    Radiological Exams on Admission: No results found.  EKG: Independently reviewed.  Borderline sinus tachycardia, sinus arrhythmia.  Assessment/Plan Principal Problem:   Lower GI bleed Active Problems:   GI bleed  (please populate well all problems here in Problem List. (For example, if patient is on BP meds at home and you resume or decide to hold them, it is a problem that needs to be her. Same for CAD, COPD, HLD and so on)  Acute on chronic symptomatic anemia secondary to recurrent lower GI bleed -Suspect recurrent diverticulosis bleeding -Hemodynamically unstable, ordered 1 unit PRBC transfusion, repeat an H&H tonight and tomorrow morning.  D/W on call Dr. Benson Norway, will order CTA and possible GI intervention if active bleeding identified.  Near syncope -Vasovagal, secondary to GI bleed, fluid resuscitation, transfusion, hold BP meds. -Orthostatic vitals Q12H.  Recent colitis -Denied any abdominal pain or diarrhea, no fever or leukocytosis, no antibiotics indicated.  DVT prophylaxis: SCD Code Status: Full code Family Communication: None at bedside Disposition Plan: Expect 1 to 2 days hospital stay Consults called: Dr. Benson Norway covering [place] today. Admission status: Tele obs   Lequita Halt MD [facility] Pager [phone]  06/08/2021, 1:55 PM

## 2021-06-08 NOTE — ED Provider Notes (Signed)
[facility] EMERGENCY DEPARTMENT Provider Note   CSN: [id number] Arrival date & time: 06/08/21  9485     History Chief Complaint  Patient presents with   Rectal Bleeding    Latoya Bennett is a 85 y.o. female.   Rectal Bleeding  Patient presents to the ER for evaluation of recurrent rectal bleeding.  Patient has a history of diverticular bleeding.  She was admitted to the hospital on in October of last year for diverticular bleeding.  She was also admitted to the hospital on November 27 of this year for rectal bleeding.  Patient had a CT scan that showed the possibility of mild diverticulitis.  She was admitted to the hospital.  She was discharged on number 30th with GI follow-up.  Patient did not require a transfusion at that time.  Patient states when she woke up this morning she noticed dark blood in her stool again.  He denies any abdominal pain.  No lightheadedness.  No fevers or chills.  Past Medical History:  Diagnosis Date   Arthritis    Diverticulitis    GERD (gastroesophageal reflux disease)    GI bleed    Hypertension     Patient Active Problem List   Diagnosis Date Noted   Diverticulitis 06/03/2021   Lower GI bleeding 06/03/2021   Acute blood loss anemia    Abnormal finding on GI tract imaging    Acute GI bleeding 06/02/2021   Hypokalemia 02/12/2019   GERD (gastroesophageal reflux disease) 02/12/2019   Symptomatic anemia 02/12/2019   GIB (gastrointestinal bleeding) 02/12/2019   Diverticulosis of colon with hemorrhage    Acute posthemorrhagic anemia    Osteoarthritis of multiple joints 05/19/2018   History of gout 05/19/2018   Lower GI bleed 04/17/2018   GI bleed 04/17/2018   Chronic pain of both ankles 03/13/2017   Osteoarthritis of both ankles 03/13/2017   PAC (premature atrial contraction) 11/24/2016   Essential hypertension 11/24/2016   Primary osteoarthritis of left knee 11/24/2016   Tachycardia 11/24/2016    Past Surgical  History:  Procedure Laterality Date   ABDOMINAL HYSTERECTOMY     COLONOSCOPY Left 02/15/2019   Procedure: COLONOSCOPY;  Surgeon: Lavena Bullion, DO;  Location: [facility];  Service: Gastroenterology;  Laterality: Left;   ESOPHAGOGASTRODUODENOSCOPY (EGD) WITH PROPOFOL Left 04/18/2018   Procedure: ESOPHAGOGASTRODUODENOSCOPY (EGD) WITH PROPOFOL;  Surgeon: Arta Silence, MD;  Location: [facility];  Service: Endoscopy;  Laterality: Left;   IR ANGIOGRAM SELECTIVE EACH ADDITIONAL VESSEL  04/27/2020   IR ANGIOGRAM SELECTIVE EACH ADDITIONAL VESSEL  04/27/2020   IR ANGIOGRAM SELECTIVE EACH ADDITIONAL VESSEL  04/27/2020   IR ANGIOGRAM VISCERAL SELECTIVE  04/26/2020   IR ANGIOGRAM VISCERAL SELECTIVE  04/26/2020   IR ANGIOGRAM VISCERAL SELECTIVE  04/27/2020   IR US GUIDE VASC ACCESS RIGHT  04/26/2020   IR US GUIDE VASC ACCESS RIGHT  04/27/2020     OB History   No obstetric history on file.     Family History  Problem Relation Age of Onset   Hypertension Sister    Colon cancer Neg Hx    Esophageal cancer Neg Hx     Social History   Tobacco Use   Smoking status: Never   Smokeless tobacco: Never  Vaping Use   Vaping Use: Never used  Substance Use Topics   Alcohol use: Not Currently    Alcohol/week: 0.0 standard drinks   Drug use: Never    Home Medications Prior to Admission medications   Medication Sig Start  Date End Date Taking? Authorizing Provider  amLODipine (NORVASC) 10 MG tablet Take 1 tablet (10 mg total) by mouth daily. 10/10/20   Horald Pollen, MD  diclofenac Sodium (VOLTAREN) 1 % GEL Apply 2 g topically 4 (four) times daily. Patient taking differently: Apply 2 g topically 4 (four) times daily as needed (knee/leg pain). 04/30/20   Shelly Coss, MD  famotidine (PEPCID) 20 MG tablet Take 1 tablet (20 mg total) by mouth daily. 06/05/21 06/05/22  Ghimire, Henreitta Leber, MD  feeding supplement, ENSURE ENLIVE, (ENSURE ENLIVE) LIQD Take 237 mLs by mouth 2 (two) times  daily between meals. Patient not taking: Reported on 04/26/2020 02/17/19   Raiford Noble Latif, DO  FEROSUL 325 (65 Fe) MG tablet TAKE 2 TABLETS(650 MG) BY MOUTH EVERY OTHER DAY Patient taking differently: 650 mg every other day. 01/30/21   Cirigliano, Vito V, DO  lactose free nutrition (BOOST) LIQD Take 237 mLs by mouth daily.    [provider]  Menthol, Topical Analgesic, (BIOFREEZE) 4 % GEL Apply 1 application topically daily.    [provider]  Multiple Vitamin (MULTIVITAMIN WITH MINERALS) TABS tablet Take 1 tablet by mouth daily. 02/18/19   Raiford Noble Latif, DO  Potassium 99 MG TABS Take 99 mg by mouth daily.    [provider]    Allergies    Patient has no known allergies.  Review of Systems   Review of Systems  Gastrointestinal:  Positive for hematochezia.  All other systems reviewed and are negative.  Physical Exam Updated Vital Signs BP (!) 109/55   Pulse 86   Temp (!) 97.4 F (36.3 C) (Oral)   Resp (!) 26   Ht 1.346 m (4\' 5" )   Wt 58.1 kg   SpO2 100%   BMI 32.04 kg/m   Physical Exam Vitals and nursing note reviewed.  Constitutional:      General: She is not in acute distress.    Appearance: She is well-developed.  HENT:     Head: Normocephalic and atraumatic.     Right Ear: External ear normal.     Left Ear: External ear normal.  Eyes:     General: No scleral icterus.       Right eye: No discharge.        Left eye: No discharge.     Conjunctiva/sclera: Conjunctivae normal.  Neck:     Trachea: No tracheal deviation.  Cardiovascular:     Rate and Rhythm: Normal rate and regular rhythm.  Pulmonary:     Effort: Pulmonary effort is normal. No respiratory distress.     Breath sounds: Normal breath sounds. No stridor. No wheezing or rales.  Abdominal:     General: Bowel sounds are normal. There is no distension.     Palpations: Abdomen is soft.     Tenderness: There is no abdominal tenderness. There is no guarding or rebound.   Genitourinary:    Comments: Maroon-colored blood noted on rectal exam Musculoskeletal:        General: No tenderness or deformity.     Cervical back: Neck supple.  Skin:    General: Skin is warm and dry.     Findings: No rash.  Neurological:     General: No focal deficit present.     Mental Status: She is alert.     Cranial Nerves: No cranial nerve deficit (no facial droop, extraocular movements intact, no slurred speech).     Sensory: No sensory deficit.     Motor:  No abnormal muscle tone or seizure activity.     Coordination: Coordination normal.  Psychiatric:        Mood and Affect: Mood normal.    ED Results / Procedures / Treatments   Labs (all labs ordered are listed, but only abnormal results are displayed) Labs Reviewed  COMPREHENSIVE METABOLIC PANEL - Abnormal; Notable for the following components:      Result Value   Potassium 3.2 (*)    Glucose, Bld 125 (*)    Total Protein 5.2 (*)    Albumin 3.0 (*)    All other components within normal limits  CBC WITH DIFFERENTIAL/PLATELET - Abnormal; Notable for the following components:   RBC 2.92 (*)    Hemoglobin 7.4 (*)    HCT 22.4 (*)    MCV 76.7 (*)    MCH 25.3 (*)    All other components within normal limits  POC OCCULT BLOOD, ED - Abnormal; Notable for the following components:   Fecal Occult Bld POSITIVE (*)    All other components within normal limits  PROTIME-INR  TYPE AND SCREEN    EKG None  Radiology No results found.  Procedures Procedures   Medications Ordered in ED Medications - No data to display  ED Course  I have reviewed the triage vital signs and the nursing notes.  Pertinent labs & imaging results that were available during my care of the patient were reviewed by me and considered in my medical decision making (see chart for details).  Clinical Course as of 06/08/21 1327  Sat Jun 08, 2021  1323 Patient's hemoglobin is decreased from 8.4-3.4.  She does have recurrent guaiac positive  stools. [JK]    Clinical Course User Index [JK] Dorie Rank, MD   MDM Rules/Calculators/A&P                           Patient presented to the ED for recurrent rectal bleeding.  Patient was recently in the hospital for GI bleeding.  Patient started having recurrent bleeding today.  On exam she does have gross blood on rectal exam.  Patient's hemoglobin has decreased to 7.4.  Suspect recurrence of her diverticular bleed.  We will need to monitor closely as she may end up requiring transfusion if her counts get lower.  I will consult the medical service for admission for evaluation Final Clinical Impression(s) / ED Diagnoses Final diagnoses:  Rectal bleeding  Anemia, unspecified type     Dorie Rank, MD 06/09/21 [phone]

## 2021-06-08 NOTE — ED Triage Notes (Signed)
[facility] reports pt coming from home c/o rectal bleeding that started this morning. She had it earlier this week and was evaluated in the ED and they advised if it started again to come back. Denies any pain.

## 2021-06-08 NOTE — ED Notes (Signed)
Got patient undressed on the monitor ppatient is resting with call bell in reach

## 2021-06-08 NOTE — Progress Notes (Signed)
PHARMACIST - PHYSICIAN ORDER COMMUNICATION  CONCERNING: P&T Medication Policy on Herbal Medications  DESCRIPTION:  This patient's order for:  Potassium chloride 99 mg (2.53 mEq)  has been noted.  This product(s) is classified as an "herbal" or natural product. Due to a lack of definitive safety studies or FDA approval, nonstandard manufacturing practices, plus the potential risk of unknown drug-drug interactions while on inpatient medications, the Pharmacy and Therapeutics Committee does not permit the use of "herbal" or natural products of this type within [facility].   ACTION TAKEN: The pharmacy department is unable to verify this order at this time and your patient has been informed of this safety policy. Please reevaluate patient's clinical condition at discharge and address if the herbal or natural product(s) should be resumed at that time.

## 2021-06-09 ENCOUNTER — Observation Stay (HOSPITAL_COMMUNITY): Payer: Medicare Other

## 2021-06-09 DIAGNOSIS — K922 Gastrointestinal hemorrhage, unspecified: Secondary | ICD-10-CM | POA: Diagnosis not present

## 2021-06-09 DIAGNOSIS — K5730 Diverticulosis of large intestine without perforation or abscess without bleeding: Secondary | ICD-10-CM | POA: Diagnosis not present

## 2021-06-09 DIAGNOSIS — K8020 Calculus of gallbladder without cholecystitis without obstruction: Secondary | ICD-10-CM | POA: Diagnosis not present

## 2021-06-09 LAB — CBC
HCT: 22.6 % — ABNORMAL LOW (ref 36.0–46.0)
HCT: 25.8 % — ABNORMAL LOW (ref 36.0–46.0)
HCT: 19.4 % — ABNORMAL LOW (ref 36.0–46.0)
Hemoglobin: 7.7 g/dL — ABNORMAL LOW (ref 12.0–15.0)
Hemoglobin: 9.1 g/dL — ABNORMAL LOW (ref 12.0–15.0)
Hemoglobin: 6.9 g/dL — CL (ref 12.0–15.0)
MCH: 26.5 pg (ref 26.0–34.0)
MCH: 27.5 pg (ref 26.0–34.0)
MCH: 27.8 pg (ref 26.0–34.0)
MCHC: 34.1 g/dL (ref 30.0–36.0)
MCHC: 35.3 g/dL (ref 30.0–36.0)
MCHC: 35.6 g/dL (ref 30.0–36.0)
MCV: 77.7 fL — ABNORMAL LOW (ref 80.0–100.0)
MCV: 77.9 fL — ABNORMAL LOW (ref 80.0–100.0)
MCV: 78.2 fL — ABNORMAL LOW (ref 80.0–100.0)
Platelets: 153 10*3/uL (ref 150–400)
Platelets: 167 10*3/uL (ref 150–400)
Platelets: 135 10*3/uL — ABNORMAL LOW (ref 150–400)
RBC: 2.91 MIL/uL — ABNORMAL LOW (ref 3.87–5.11)
RBC: 3.31 MIL/uL — ABNORMAL LOW (ref 3.87–5.11)
RBC: 2.48 MIL/uL — ABNORMAL LOW (ref 3.87–5.11)
RDW: 15.7 % — ABNORMAL HIGH (ref 11.5–15.5)
RDW: 15.9 % — ABNORMAL HIGH (ref 11.5–15.5)
RDW: 16.1 % — ABNORMAL HIGH (ref 11.5–15.5)
WBC: 4.1 10*3/uL (ref 4.0–10.5)
WBC: 4.3 10*3/uL (ref 4.0–10.5)
WBC: 5.8 10*3/uL (ref 4.0–10.5)
nRBC: 0 % (ref 0.0–0.2)
nRBC: 0 % (ref 0.0–0.2)
nRBC: 0 % (ref 0.0–0.2)

## 2021-06-09 LAB — PREPARE RBC (CROSSMATCH)

## 2021-06-09 LAB — HEMOGLOBIN AND HEMATOCRIT, BLOOD
HCT: 22.2 % — ABNORMAL LOW (ref 36.0–46.0)
Hemoglobin: 7.9 g/dL — ABNORMAL LOW (ref 12.0–15.0)

## 2021-06-09 MED ORDER — IOPAMIDOL (ISOVUE-370) INJECTION 76%
75.0000 mL | Freq: Once | INTRAVENOUS | Status: AC | PRN
  Administered 2021-06-09: 02:00:00 75 mL via INTRAVENOUS

## 2021-06-09 MED ORDER — SODIUM CHLORIDE 0.9% IV SOLUTION: Freq: Once | INTRAVENOUS | Status: AC

## 2021-06-09 NOTE — Progress Notes (Signed)
PROGRESS NOTE FOR [facility]  Subjective: Bleeding slowed down.  Objective: Vital signs in last 24 hours: Temp:  [97.4 F (36.3 C)-98.7 F (37.1 C)] 98.1 F (36.7 C) (12/04 0455) Pulse Rate:  [63-90] 78 (12/04 0455) Resp:  [11-26] 17 (12/04 0455) BP: (89-159)/(49-132) 121/65 (12/04 0455) SpO2:  [100 %] 100 % (12/04 0455) Weight:  [58.1 kg] 58.1 kg (12/03 0847) Last BM Date: 06/08/21  Intake/Output from previous day: 12/03 0701 - 12/04 0700 In: 1049.8 [P.O.:200; I.V.:479.8; Blood:370] Out: -  Intake/Output this shift: No intake/output data recorded.  General appearance: alert and no distress GI: soft, non-tender; bowel sounds normal; no masses,  no organomegaly  Lab Results: Recent Labs    06/08/21 1231 06/09/21 0119  WBC 4.9 4.1  HGB 7.4* 7.9*  7.7*  HCT 22.4* 22.2*  22.6*  PLT 179 153   BMET Recent Labs    06/08/21 1231  NA 139  K 3.2*  CL 105  CO2 26  GLUCOSE 125*  BUN 9  CREATININE 0.78  CALCIUM 9.5   LFT Recent Labs    06/08/21 1231  PROT 5.2*  ALBUMIN 3.0*  AST 39  ALT 28  ALKPHOS 58  BILITOT 0.3   PT/INR Recent Labs    06/08/21 1231  LABPROT 14.0  INR 1.1   Hepatitis Panel No results for input(s): HEPBSAG, HCVAB, HEPAIGM, HEPBIGM in the last 72 hours. C-Diff No results for input(s): CDIFFTOX in the last 72 hours. Fecal Lactopherrin No results for input(s): FECLLACTOFRN in the last 72 hours.  Studies/Results: CT Angio Abd/Pel w/ and/or w/o  Result Date: 06/09/2021 CLINICAL DATA:  Gastrointestinal hemorrhage EXAM: CTA ABDOMEN AND PELVIS WITHOUT AND WITH CONTRAST TECHNIQUE: Multidetector CT imaging of the abdomen and pelvis was performed using the standard protocol during bolus administration of intravenous contrast. Multiplanar reconstructed images and MIPs were obtained and reviewed to evaluate the vascular anatomy. CONTRAST:  61mL ISOVUE-370 IOPAMIDOL (ISOVUE-370) INJECTION 76% COMPARISON:  06/02/2021 FINDINGS: VASCULAR Aorta: No  aneurysm or dissection. Mild atherosclerotic calcification. No periaortic inflammatory change. Celiac: Patent without evidence of aneurysm, dissection, vasculitis or significant stenosis. SMA: Patent without evidence of aneurysm, dissection, vasculitis or significant stenosis. Renals: Both renal arteries are patent without evidence of aneurysm, dissection, vasculitis, fibromuscular dysplasia or significant stenosis. IMA: Patent without evidence of aneurysm, dissection, vasculitis or significant stenosis. Inflow: Patent without evidence of aneurysm, dissection, vasculitis or significant stenosis. Proximal Outflow: Bilateral common femoral and visualized portions of the superficial and profunda femoral arteries are patent without evidence of aneurysm, dissection, vasculitis or significant stenosis. Veins: Unremarkable Review of the MIP images confirms the above findings. NON-VASCULAR Lower chest: Visualized lung bases are clear. Extensive multi-vessel coronary artery calcification. Cardiac size is mildly enlarged. Hepatobiliary: Liver is unremarkable. Cholelithiasis noted without pericholecystic inflammatory change. No intra hepatic biliary ductal dilation. Stable mild extrahepatic biliary ductal dilation Pancreas: Dense calcification in the region of the central pancreatic duct within the pancreatic head is unchanged measuring 12 mm. Distally the, the pancreatic duct is dilated and there is marked atrophy of the pancreatic parenchyma. Spleen: Unremarkable Adrenals/Urinary Tract: The adrenal glands are unremarkable. Multiple cortical and parapelvic cysts are identified within the kidneys. No intrarenal or ureteral calculi are identified. No hydronephrosis. No enhancing intrarenal masses. The bladder is mildly distended but is otherwise unremarkable. Stomach/Bowel: Inflammatory changes are again identified involving the mid to distal descending colon, stable since prior examination. There is background moderate  pancolonic diverticulosis. Together, the findings suggest changes of mild acute or subacute  diverticulitis. There is no evidence of obstruction. No free intraperitoneal gas or fluid to suggest perforation. No loculated intra-abdominal fluid collections. No active gastrointestinal hemorrhage is identified. Lymphatic: No pathologic adenopathy within the abdomen and pelvis. Reproductive: Status post hysterectomy. No adnexal masses. Other: No abdominal wall hernia Musculoskeletal: No acute bone abnormality. No lytic or blastic bone lesion is identified. IMPRESSION: VASCULAR No evidence of abdominal aortic aneurysm or dissection. No active gastrointestinal hemorrhage or culprit lesion identified. NON-VASCULAR Extensive coronary artery calcification.  Mild cardiomegaly. Cholelithiasis. Stable central probable intraductal pancreatic calcification with distal atrophy of the pancreas likely related to chronic obstruction. Pancolonic diverticulosis. Superimposed stable mild distal descending colonic inflammatory change most in keeping with very mild acute or subacute diverticulitis. No evidence of obstruction or perforation. No loculated intra-abdominal fluid collections identified. Aortic Atherosclerosis (ICD10-I70.0). Electronically Signed   By: Fidela Salisbury M.Bennett.   On: 06/09/2021 02:43    Medications: Scheduled:  acidophilus  2 capsule Oral TID   famotidine  20 mg Oral Daily   ferrous sulfate  650 mg Oral QODAY   lactose free nutrition  237 mL Oral Daily   multivitamin with minerals  1 tablet Oral Daily   Muscle Rub  1 application Topical Daily   Continuous:  sodium chloride 125 mL/hr at 06/09/21 0420    Assessment/Plan: 1) Recurrent diverticular bleed. 2) Anemia.   The patient is readmitted for a diverticular bleed.  She was discharged a few days ago with an HGB of 8.4 g/dL.  Her current HGB is at 7.9 g/dL.  The repeat CTA was negative for any source of bleeding.  Plan: 1) Continue with supportive  care.  LOS: 0 days   Latoya Bennett 06/09/2021, 7:37 AM

## 2021-06-09 NOTE — Plan of Care (Signed)

## 2021-06-09 NOTE — Progress Notes (Signed)
PROGRESS NOTE    Latoya Bennett  ZOX:[id number] DOB: 1935-09-18 DOA: 06/08/2021 PCP: Armanda Heritage, NP   Brief Narrative: 85 year old with past medical history significant for repeated lower GI bleed secondary to diverticulosis, hypertension, knee osteoarthritis, GERD who presented with recurrent diverticular bleeding.  Patient recently discharged from the hospital 11/29 treated for GI bleed at that time. Presented with recurrent bloody bowel movement.  Hemoglobin on admission was 7.4.  She received 1 unit of packed red blood cells on admission.    Assessment & Plan:   Principal Problem:   Lower GI bleed Active Problems:   GI bleed  1-GI bleed: Presumed diverticular bleed: -patient presents with recurrent bloody stool. -CTA abdomen negative for active bleeding -Globin on admission 7.4.  Received 1 unit of packed red blood cell on admission. -Hemoglobin this am at 9.   2-Syncope: Vasovagal in the setting of GI bleed.  Received IV fluid and transfusion.  Recent colitis: Denies abdominal pain diarrhea. Osteoarthritis: Hold Voltaren gel. Hypokalemia; replaced orally.    HTN; PRN hydralazine.   Estimated body mass index is 32.04 kg/m as calculated from the following:   Height as of this encounter: 4\' 5"  (1.346 m).   Weight as of this encounter: 58.1 kg.   DVT prophylaxis: SCD Code Status: Full Code Family Communication: care discussed with patient.  Disposition Plan:  Status is: Observation  The patient remains OBS appropriate and will d/c before 2 midnights.       Consultants:  GI, Dr Benson [place]   Procedures:  None  Antimicrobials:    Subjective: She is feeling ok, report last BM this am at 5 am,less bloody.  Denies abdominal pain.    Objective: Vitals:   06/08/21 1815 06/08/21 2100 06/08/21 2200 06/09/21 0455  BP: (!) 117/53 91/60 (!) 131/59 121/65  Pulse: 83 88 87 78  Resp: 16 16 18 17   Temp: 97.9 F (36.6 C) 98.6 F (37 C) 98.7 F (37.1 C)  98.1 F (36.7 C)  TempSrc: Oral Oral Oral Oral  SpO2: 100% 100% 100% 100%  Weight:      Height:        Intake/Output Summary (Last 24 hours) at 06/09/2021 0813 Last data filed at 06/09/2021 0420 Gross per 24 hour  Intake 1049.81 ml  Output --  Net 1049.81 ml   Filed Weights   06/08/21 0847  Weight: 58.1 kg    Examination:  General exam: Appears calm and comfortable  Respiratory system: Clear to auscultation. Respiratory effort normal. Cardiovascular system: S1 & S2 heard, RRR. No JVD, murmurs, rubs, gallops or clicks. No pedal edema. Gastrointestinal system: Abdomen is nondistended, soft and nontender. No organomegaly or masses felt. Normal bowel sounds heard. Central nervous system: Alert and oriented. No focal neurological deficits. Extremities: Symmetric 5 x 5 power.    Data Reviewed: I have personally reviewed following labs and imaging studies  CBC: Recent Labs  Lab 06/02/21 2044 06/03/21 0343 06/04/21 1406 06/04/21 1932 06/05/21 0358 06/08/21 1231 06/09/21 0119  WBC 6.3   < > 6.3 5.0 3.8* 4.9 4.1  NEUTROABS 4.7  --   --   --   --  3.5  --   HGB 9.8*   < > 9.3* 8.1* 8.4* 7.4* 7.9*  7.7*  HCT 28.8*   < > 27.7* 23.2* 23.9* 22.4* 22.2*  22.6*  MCV 75.0*   < > 73.7* 74.1* 73.8* 76.7* 77.7*  PLT 180   < > 189 156 163 179 153   < > =  values in this interval not displayed.   Basic Metabolic Panel: Recent Labs  Lab 06/02/21 1906 06/03/21 0343 06/04/21 0457 06/08/21 1231  NA 139 134* 137 139  K 3.7 3.3* 3.5 3.2*  CL 106 103 107 105  CO2 25 27 27 26   GLUCOSE 147* 118* 114* 125*  BUN 16 13 8 9   CREATININE 0.78 0.68 0.65 0.78  CALCIUM 9.8 8.8* 9.3 9.5  MG  --   --  1.8  --    GFR: Estimated Creatinine Clearance: 34.4 mL/min (by C-G formula based on SCr of 0.78 mg/dL). Liver Function Tests: Recent Labs  Lab 06/02/21 1906 06/03/21 0343 06/08/21 1231  AST 20 17 39  ALT 17 15 28   ALKPHOS 75 56 58  BILITOT 0.4 0.1* 0.3  PROT 5.8* 4.4* 5.2*  ALBUMIN  3.1* 2.4* 3.0*   Recent Labs  Lab 06/02/21 1906  LIPASE 19   No results for input(s): AMMONIA in the last 168 hours. Coagulation Profile: Recent Labs  Lab 06/03/21 0343 06/08/21 1231  INR 1.2 1.1   Cardiac Enzymes: No results for input(s): CKTOTAL, CKMB, CKMBINDEX, TROPONINI in the last 168 hours. BNP (last 3 results) No results for input(s): PROBNP in the last 8760 hours. HbA1C: No results for input(s): HGBA1C in the last 72 hours. CBG: No results for input(s): GLUCAP in the last 168 hours. Lipid Profile: No results for input(s): CHOL, HDL, LDLCALC, TRIG, CHOLHDL, LDLDIRECT in the last 72 hours. Thyroid Function Tests: No results for input(s): TSH, T4TOTAL, FREET4, T3FREE, THYROIDAB in the last 72 hours. Anemia Panel: No results for input(s): VITAMINB12, FOLATE, FERRITIN, TIBC, IRON, RETICCTPCT in the last 72 hours. Sepsis Labs: No results for input(s): PROCALCITON, LATICACIDVEN in the last 168 hours.  Recent Results (from the past 240 hour(s))  Resp Panel by RT-PCR (Flu A&B, Covid) Nasopharyngeal Swab     Status: None   Collection Time: 06/02/21  9:42 PM   Specimen: Nasopharyngeal Swab; Nasopharyngeal(NP) swabs in vial transport medium  Result Value Ref Range Status   SARS Coronavirus 2 by RT PCR NEGATIVE NEGATIVE Final    Comment: (NOTE) SARS-CoV-2 target nucleic acids are NOT DETECTED.  The SARS-CoV-2 RNA is generally detectable in upper respiratory specimens during the acute phase of infection. The lowest concentration of SARS-CoV-2 viral copies this assay can detect is 138 copies/mL. A negative result does not preclude SARS-Cov-2 infection and should not be used as the sole basis for treatment or other patient management decisions. A negative result may occur with  improper specimen collection/handling, submission of specimen other than nasopharyngeal swab, presence of viral mutation(s) within the areas targeted by this assay, and inadequate number of  viral copies(<138 copies/mL). A negative result must be combined with clinical observations, patient history, and epidemiological information. The expected result is Negative.  Fact Sheet for Patients:  EntrepreneurPulse.com.au  Fact Sheet for Healthcare Providers:  IncredibleEmployment.be  This test is no t yet approved or cleared by the [place] FDA and  has been authorized for detection and/or diagnosis of SARS-CoV-2 by FDA under an Emergency Use Authorization (EUA). This EUA will remain  in effect (meaning this test can be used) for the duration of the COVID-19 declaration under Section 564(b)(1) of the Act, 21 U.S.C.section 360bbb-3(b)(1), unless the authorization is terminated  or revoked sooner.       Influenza A by PCR NEGATIVE NEGATIVE Final   Influenza B by PCR NEGATIVE NEGATIVE Final    Comment: (NOTE) The Xpert Xpress SARS-CoV-2/FLU/RSV plus assay  is intended as an aid in the diagnosis of influenza from Nasopharyngeal swab specimens and should not be used as a sole basis for treatment. Nasal washings and aspirates are unacceptable for Xpert Xpress SARS-CoV-2/FLU/RSV testing.  Fact Sheet for Patients: EntrepreneurPulse.com.au  Fact Sheet for Healthcare Providers: IncredibleEmployment.be  This test is not yet approved or cleared by the [place] FDA and has been authorized for detection and/or diagnosis of SARS-CoV-2 by FDA under an Emergency Use Authorization (EUA). This EUA will remain in effect (meaning this test can be used) for the duration of the COVID-19 declaration under Section 564(b)(1) of the Act, 21 U.S.C. section 360bbb-3(b)(1), unless the authorization is terminated or revoked.  Performed at [facility], [place] [address] [place], [place] [address]          Radiology Studies: CT Angio Abd/Pel w/ and/or w/o  Result Date: 06/09/2021 CLINICAL DATA:   Gastrointestinal hemorrhage EXAM: CTA ABDOMEN AND PELVIS WITHOUT AND WITH CONTRAST TECHNIQUE: Multidetector CT imaging of the abdomen and pelvis was performed using the standard protocol during bolus administration of intravenous contrast. Multiplanar reconstructed images and MIPs were obtained and reviewed to evaluate the vascular anatomy. CONTRAST:  62mL ISOVUE-370 IOPAMIDOL (ISOVUE-370) INJECTION 76% COMPARISON:  06/02/2021 FINDINGS: VASCULAR Aorta: No aneurysm or dissection. Mild atherosclerotic calcification. No periaortic inflammatory change. Celiac: Patent without evidence of aneurysm, dissection, vasculitis or significant stenosis. SMA: Patent without evidence of aneurysm, dissection, vasculitis or significant stenosis. Renals: Both renal arteries are patent without evidence of aneurysm, dissection, vasculitis, fibromuscular dysplasia or significant stenosis. IMA: Patent without evidence of aneurysm, dissection, vasculitis or significant stenosis. Inflow: Patent without evidence of aneurysm, dissection, vasculitis or significant stenosis. Proximal Outflow: Bilateral common femoral and visualized portions of the superficial and profunda femoral arteries are patent without evidence of aneurysm, dissection, vasculitis or significant stenosis. Veins: Unremarkable Review of the MIP images confirms the above findings. NON-VASCULAR Lower chest: Visualized lung bases are clear. Extensive multi-vessel coronary artery calcification. Cardiac size is mildly enlarged. Hepatobiliary: Liver is unremarkable. Cholelithiasis noted without pericholecystic inflammatory change. No intra hepatic biliary ductal dilation. Stable mild extrahepatic biliary ductal dilation Pancreas: Dense calcification in the region of the central pancreatic duct within the pancreatic head is unchanged measuring 12 mm. Distally the, the pancreatic duct is dilated and there is marked atrophy of the pancreatic parenchyma. Spleen: Unremarkable  Adrenals/Urinary Tract: The adrenal glands are unremarkable. Multiple cortical and parapelvic cysts are identified within the kidneys. No intrarenal or ureteral calculi are identified. No hydronephrosis. No enhancing intrarenal masses. The bladder is mildly distended but is otherwise unremarkable. Stomach/Bowel: Inflammatory changes are again identified involving the mid to distal descending colon, stable since prior examination. There is background moderate pancolonic diverticulosis. Together, the findings suggest changes of mild acute or subacute diverticulitis. There is no evidence of obstruction. No free intraperitoneal gas or fluid to suggest perforation. No loculated intra-abdominal fluid collections. No active gastrointestinal hemorrhage is identified. Lymphatic: No pathologic adenopathy within the abdomen and pelvis. Reproductive: Status post hysterectomy. No adnexal masses. Other: No abdominal wall hernia Musculoskeletal: No acute bone abnormality. No lytic or blastic bone lesion is identified. IMPRESSION: VASCULAR No evidence of abdominal aortic aneurysm or dissection. No active gastrointestinal hemorrhage or culprit lesion identified. NON-VASCULAR Extensive coronary artery calcification.  Mild cardiomegaly. Cholelithiasis. Stable central probable intraductal pancreatic calcification with distal atrophy of the pancreas likely related to chronic obstruction. Pancolonic diverticulosis. Superimposed stable mild distal descending colonic inflammatory change most in keeping with very mild acute or subacute  diverticulitis. No evidence of obstruction or perforation. No loculated intra-abdominal fluid collections identified. Aortic Atherosclerosis (ICD10-I70.0). Electronically Signed   By: Fidela Salisbury M.D.   On: 06/09/2021 02:43        Scheduled Meds:  acidophilus  2 capsule Oral TID   famotidine  20 mg Oral Daily   ferrous sulfate  650 mg Oral QODAY   lactose free nutrition  237 mL Oral Daily    multivitamin with minerals  1 tablet Oral Daily   Muscle Rub  1 application Topical Daily   Continuous Infusions:  sodium chloride 125 mL/hr at 06/09/21 0420     LOS: 0 days    Time spent: 35 minutes    Demari Kropp A Kasim Mccorkle, MD [facility]   If 7PM-7AM, please contact night-coverage [email or web address]  06/09/2021, 8:13 AM

## 2021-06-09 NOTE — Care Management Obs Status (Signed)
[facility] NOTIFICATION   Patient Details  Name: Latoya Bennett MRN: [id number] Date of Birth: 07-26-1935   Medicare Observation Status Notification Given:  Yes    Carles Collet, RN 06/09/2021, 3:21 PM

## 2021-06-10 DIAGNOSIS — K5731 Diverticulosis of large intestine without perforation or abscess with bleeding: Secondary | ICD-10-CM | POA: Diagnosis not present

## 2021-06-10 DIAGNOSIS — D62 Acute posthemorrhagic anemia: Secondary | ICD-10-CM

## 2021-06-10 DIAGNOSIS — M1710 Unilateral primary osteoarthritis, unspecified knee: Secondary | ICD-10-CM | POA: Diagnosis present

## 2021-06-10 DIAGNOSIS — E876 Hypokalemia: Secondary | ICD-10-CM | POA: Diagnosis present

## 2021-06-10 DIAGNOSIS — K922 Gastrointestinal hemorrhage, unspecified: Secondary | ICD-10-CM | POA: Diagnosis not present

## 2021-06-10 DIAGNOSIS — K921 Melena: Secondary | ICD-10-CM

## 2021-06-10 DIAGNOSIS — K5733 Diverticulitis of large intestine without perforation or abscess with bleeding: Secondary | ICD-10-CM | POA: Diagnosis present

## 2021-06-10 DIAGNOSIS — K8020 Calculus of gallbladder without cholecystitis without obstruction: Secondary | ICD-10-CM | POA: Diagnosis not present

## 2021-06-10 DIAGNOSIS — K219 Gastro-esophageal reflux disease without esophagitis: Secondary | ICD-10-CM | POA: Diagnosis present

## 2021-06-10 DIAGNOSIS — K5730 Diverticulosis of large intestine without perforation or abscess without bleeding: Secondary | ICD-10-CM | POA: Diagnosis not present

## 2021-06-10 DIAGNOSIS — Z8249 Family history of ischemic heart disease and other diseases of the circulatory system: Secondary | ICD-10-CM | POA: Diagnosis not present

## 2021-06-10 DIAGNOSIS — N3289 Other specified disorders of bladder: Secondary | ICD-10-CM | POA: Diagnosis not present

## 2021-06-10 DIAGNOSIS — K625 Hemorrhage of anus and rectum: Secondary | ICD-10-CM | POA: Diagnosis not present

## 2021-06-10 DIAGNOSIS — K6289 Other specified diseases of anus and rectum: Secondary | ICD-10-CM | POA: Diagnosis not present

## 2021-06-10 DIAGNOSIS — I10 Essential (primary) hypertension: Secondary | ICD-10-CM | POA: Diagnosis present

## 2021-06-10 DIAGNOSIS — R54 Age-related physical debility: Secondary | ICD-10-CM | POA: Diagnosis present

## 2021-06-10 LAB — CBC
HCT: 28.7 % — ABNORMAL LOW (ref 36.0–46.0)
HCT: 30.4 % — ABNORMAL LOW (ref 36.0–46.0)
HCT: 29 % — ABNORMAL LOW (ref 36.0–46.0)
Hemoglobin: 10.1 g/dL — ABNORMAL LOW (ref 12.0–15.0)
Hemoglobin: 10.4 g/dL — ABNORMAL LOW (ref 12.0–15.0)
Hemoglobin: 10.3 g/dL — ABNORMAL LOW (ref 12.0–15.0)
MCH: 28.8 pg (ref 26.0–34.0)
MCH: 27.7 pg (ref 26.0–34.0)
MCH: 28.7 pg (ref 26.0–34.0)
MCHC: 35.2 g/dL (ref 30.0–36.0)
MCHC: 34.2 g/dL (ref 30.0–36.0)
MCHC: 35.5 g/dL (ref 30.0–36.0)
MCV: 81.8 fL (ref 80.0–100.0)
MCV: 81.1 fL (ref 80.0–100.0)
MCV: 80.8 fL (ref 80.0–100.0)
Platelets: DECREASED 10*3/uL (ref 150–400)
Platelets: 134 10*3/uL — ABNORMAL LOW (ref 150–400)
Platelets: 141 10*3/uL — ABNORMAL LOW (ref 150–400)
RBC: 3.51 MIL/uL — ABNORMAL LOW (ref 3.87–5.11)
RBC: 3.75 MIL/uL — ABNORMAL LOW (ref 3.87–5.11)
RBC: 3.59 MIL/uL — ABNORMAL LOW (ref 3.87–5.11)
RDW: 16.4 % — ABNORMAL HIGH (ref 11.5–15.5)
RDW: 16.3 % — ABNORMAL HIGH (ref 11.5–15.5)
RDW: 16.7 % — ABNORMAL HIGH (ref 11.5–15.5)
WBC: 5.7 10*3/uL (ref 4.0–10.5)
WBC: 6.8 10*3/uL (ref 4.0–10.5)
WBC: 6 10*3/uL (ref 4.0–10.5)
nRBC: 0 % (ref 0.0–0.2)
nRBC: 0 % (ref 0.0–0.2)
nRBC: 0 % (ref 0.0–0.2)

## 2021-06-10 LAB — BASIC METABOLIC PANEL
Anion gap: 6 (ref 5–15)
BUN: 10 mg/dL (ref 8–23)
CO2: 22 mmol/L (ref 22–32)
Calcium: 8.9 mg/dL (ref 8.9–10.3)
Chloride: 108 mmol/L (ref 98–111)
Creatinine, Ser: 0.65 mg/dL (ref 0.44–1.00)
GFR, Estimated: >60 mL/min (ref >60)
Glucose, Bld: 175 mg/dL — ABNORMAL HIGH (ref 70–99)
Potassium: 3.5 mmol/L (ref 3.5–5.1)
Sodium: 136 mmol/L (ref 135–145)

## 2021-06-10 LAB — MAGNESIUM: Magnesium: 1.8 mg/dL (ref 1.7–2.4)

## 2021-06-10 MED ORDER — SODIUM CHLORIDE 0.9 % IV BOLUS
500.0000 mL | Freq: Once | INTRAVENOUS | Status: AC
  Administered 2021-06-10: 500 mL via INTRAVENOUS

## 2021-06-10 MED ORDER — SODIUM CHLORIDE 0.9 % IV SOLN: INTRAVENOUS | Status: DC

## 2021-06-10 MED ORDER — MAGNESIUM SULFATE 2 GM/50ML IV SOLN
2.0000 g | Freq: Once | INTRAVENOUS | Status: AC
Start: 2021-06-10 — End: 2021-06-10
  Administered 2021-06-10: 2 g via INTRAVENOUS
  Filled 2021-06-10: qty 50

## 2021-06-10 NOTE — Progress Notes (Addendum)
PROGRESS NOTE    Latoya Bennett  DVV:[id number] DOB: [birth year] DOA: 06/08/2021 PCP: Armanda Heritage, NP   Brief Narrative: 85 year old with past medical history significant for repeated lower GI bleed secondary to diverticulosis, hypertension, knee osteoarthritis, GERD who presented with recurrent diverticular bleeding.  Patient recently discharged from the hospital 11/29 treated for GI bleed at that time. Presented with recurrent bloody bowel movement.  Hemoglobin on admission was 7.4.  She received 1 unit of packed red blood cells on admission.    Assessment & Plan:   Principal Problem:   Lower GI bleed Active Problems:   GI bleed  1-GI bleed: Presumed diverticular bleed: -patient presents with recurrent bloody stool. -CTA abdomen negative for active bleeding -She has received 3 units PRBC so far this admission.  -Hb drop to 6.9 last night. She had repeated CTA negative for bleeding. Received 2 units PRBC>  -Hb at 10 this am. Had large blood BM.  Plan to continue with IV fluids, IV bolus. GI informed.  Transfer to progressive Unit.   2-Syncope: Vasovagal in the setting of GI bleed.  Received IV fluid and transfusion.  Recent colitis: Denies abdominal pain diarrhea. Osteoarthritis: Hold Voltaren gel. Hypokalemia; Replaced.   HTN:  PRN hydralazine.   Estimated body mass index is 32.04 kg/m as calculated from the following:   Height as of this encounter: 4\' 5"  (1.346 m).   Weight as of this encounter: 58.1 kg.   DVT prophylaxis: SCD Code Status: Full Code Family Communication: Care giver updated Disposition Plan:  Status is: Observation  The patient remains OBS appropriate and will d/c before 2 midnights.       Consultants:  GI, Dr Benson Norway   Procedures:  None  Antimicrobials:    Subjective: She had large bloody BM this am.  She denies abdominal pain.    Objective: Vitals:   06/10/21 0328 06/10/21 0420 06/10/21 0540 06/10/21 0718  BP: (!) 100/55  100/60 122/66 (!) 119/99  Pulse: 75 78 72 95  Resp: 18 18 18 16   Temp: 98.7 F (37.1 C) 98.5 F (36.9 C) 98.5 F (36.9 C) 98.5 F (36.9 C)  TempSrc: Oral Oral Oral Oral  SpO2: 100% 100% 100% 100%  Weight:      Height:        Intake/Output Summary (Last 24 hours) at 06/10/2021 1012 Last data filed at 06/10/2021 0540 Gross per 24 hour  Intake 2473.62 ml  Output 1000 ml  Net 1473.62 ml    Filed Weights   06/08/21 0847  Weight: 58.1 kg    Examination:  General exam: NAD Respiratory system: CTA Cardiovascular system: SS 1, S 2 RRR Gastrointestinal system: BS present, soft, nt Central nervous system: Non focal.  Extremities: Symmetric power    Data Reviewed: I have personally reviewed following labs and imaging studies  CBC: Recent Labs  Lab 06/08/21 1231 06/09/21 0119 06/09/21 1047 06/09/21 2243 06/10/21 0806  WBC 4.9 4.1 4.3 5.8 5.7  NEUTROABS 3.5  --   --   --   --   HGB 7.4* 7.9*  7.7* 9.1* 6.9* 10.1*  HCT 22.4* 22.2*  22.6* 25.8* 19.4* 28.7*  MCV 76.7* 77.7* 77.9* 78.2* 81.8  PLT 179 153 167 135* PLATELET CLUMPS NOTED ON SMEAR, COUNT APPEARS DECREASED    Basic Metabolic Panel: Recent Labs  Lab 06/04/21 0457 06/08/21 1231 06/10/21 0806  NA 137 139 136  K 3.5 3.2* 3.5  CL 107 105 108  CO2 27 26 22   GLUCOSE 114* 125*  175*  BUN 8 9 10   CREATININE 0.65 0.78 0.65  CALCIUM 9.3 9.5 8.9  MG 1.8  --  1.8    GFR: Estimated Creatinine Clearance: 34.4 mL/min (by C-G formula based on SCr of 0.65 mg/dL). Liver Function Tests: Recent Labs  Lab 06/08/21 1231  AST 39  ALT 28  ALKPHOS 58  BILITOT 0.3  PROT 5.2*  ALBUMIN 3.0*    No results for input(s): LIPASE, AMYLASE in the last 168 hours.  No results for input(s): AMMONIA in the last 168 hours. Coagulation Profile: Recent Labs  Lab 06/08/21 1231  INR 1.1    Cardiac Enzymes: No results for input(s): CKTOTAL, CKMB, CKMBINDEX, TROPONINI in the last 168 hours. BNP (last 3 results) No  results for input(s): PROBNP in the last 8760 hours. HbA1C: No results for input(s): HGBA1C in the last 72 hours. CBG: No results for input(s): GLUCAP in the last 168 hours. Lipid Profile: No results for input(s): CHOL, HDL, LDLCALC, TRIG, CHOLHDL, LDLDIRECT in the last 72 hours. Thyroid Function Tests: No results for input(s): TSH, T4TOTAL, FREET4, T3FREE, THYROIDAB in the last 72 hours. Anemia Panel: No results for input(s): VITAMINB12, FOLATE, FERRITIN, TIBC, IRON, RETICCTPCT in the last 72 hours. Sepsis Labs: No results for input(s): PROCALCITON, LATICACIDVEN in the last 168 hours.  Recent Results (from the past 240 hour(s))  Resp Panel by RT-PCR (Flu A&B, Covid) Nasopharyngeal Swab     Status: None   Collection Time: 06/02/21  9:42 PM   Specimen: Nasopharyngeal Swab; Nasopharyngeal(NP) swabs in vial transport medium  Result Value Ref Range Status   SARS Coronavirus 2 by RT PCR NEGATIVE NEGATIVE Final    Comment: (NOTE) SARS-CoV-2 target nucleic acids are NOT DETECTED.  The SARS-CoV-2 RNA is generally detectable in upper respiratory specimens during the acute phase of infection. The lowest concentration of SARS-CoV-2 viral copies this assay can detect is 138 copies/mL. A negative result does not preclude SARS-Cov-2 infection and should not be used as the sole basis for treatment or other patient management decisions. A negative result may occur with  improper specimen collection/handling, submission of specimen other than nasopharyngeal swab, presence of viral mutation(s) within the areas targeted by this assay, and inadequate number of viral copies(<138 copies/mL). A negative result must be combined with clinical observations, patient history, and epidemiological information. The expected result is Negative.  Fact Sheet for Patients:  EntrepreneurPulse.com.au  Fact Sheet for Healthcare Providers:  IncredibleEmployment.be  This test is  no t yet approved or cleared by the [place] FDA and  has been authorized for detection and/or diagnosis of SARS-CoV-2 by FDA under an Emergency Use Authorization (EUA). This EUA will remain  in effect (meaning this test can be used) for the duration of the COVID-19 declaration under Section 564(b)(1) of the Act, 21 U.S.C.section 360bbb-3(b)(1), unless the authorization is terminated  or revoked sooner.       Influenza A by PCR NEGATIVE NEGATIVE Final   Influenza B by PCR NEGATIVE NEGATIVE Final    Comment: (NOTE) The Xpert Xpress SARS-CoV-2/FLU/RSV plus assay is intended as an aid in the diagnosis of influenza from Nasopharyngeal swab specimens and should not be used as a sole basis for treatment. Nasal washings and aspirates are unacceptable for Xpert Xpress SARS-CoV-2/FLU/RSV testing.  Fact Sheet for Patients: EntrepreneurPulse.com.au  Fact Sheet for Healthcare Providers: IncredibleEmployment.be  This test is not yet approved or cleared by the [place] FDA and has been authorized for detection and/or diagnosis of SARS-CoV-2 by FDA  under an Emergency Use Authorization (EUA). This EUA will remain in effect (meaning this test can be used) for the duration of the COVID-19 declaration under Section 564(b)(1) of the Act, 21 U.S.C. section 360bbb-3(b)(1), unless the authorization is terminated or revoked.  Performed at [facility], [place] [address], [place], [place] [address]           Radiology Studies: CT Angio Abd/Pel w/ and/or w/o  Result Date: 06/09/2021 CLINICAL DATA:  Gastrointestinal hemorrhage EXAM: CTA ABDOMEN AND PELVIS WITHOUT AND WITH CONTRAST TECHNIQUE: Multidetector CT imaging of the abdomen and pelvis was performed using the standard protocol during bolus administration of intravenous contrast. Multiplanar reconstructed images and MIPs were obtained and reviewed to evaluate the vascular anatomy. CONTRAST:   35mL ISOVUE-370 IOPAMIDOL (ISOVUE-370) INJECTION 76% COMPARISON:  06/02/2021 FINDINGS: VASCULAR Aorta: No aneurysm or dissection. Mild atherosclerotic calcification. No periaortic inflammatory change. Celiac: Patent without evidence of aneurysm, dissection, vasculitis or significant stenosis. SMA: Patent without evidence of aneurysm, dissection, vasculitis or significant stenosis. Renals: Both renal arteries are patent without evidence of aneurysm, dissection, vasculitis, fibromuscular dysplasia or significant stenosis. IMA: Patent without evidence of aneurysm, dissection, vasculitis or significant stenosis. Inflow: Patent without evidence of aneurysm, dissection, vasculitis or significant stenosis. Proximal Outflow: Bilateral common femoral and visualized portions of the superficial and profunda femoral arteries are patent without evidence of aneurysm, dissection, vasculitis or significant stenosis. Veins: Unremarkable Review of the MIP images confirms the above findings. NON-VASCULAR Lower chest: Visualized lung bases are clear. Extensive multi-vessel coronary artery calcification. Cardiac size is mildly enlarged. Hepatobiliary: Liver is unremarkable. Cholelithiasis noted without pericholecystic inflammatory change. No intra hepatic biliary ductal dilation. Stable mild extrahepatic biliary ductal dilation Pancreas: Dense calcification in the region of the central pancreatic duct within the pancreatic head is unchanged measuring 12 mm. Distally the, the pancreatic duct is dilated and there is marked atrophy of the pancreatic parenchyma. Spleen: Unremarkable Adrenals/Urinary Tract: The adrenal glands are unremarkable. Multiple cortical and parapelvic cysts are identified within the kidneys. No intrarenal or ureteral calculi are identified. No hydronephrosis. No enhancing intrarenal masses. The bladder is mildly distended but is otherwise unremarkable. Stomach/Bowel: Inflammatory changes are again identified  involving the mid to distal descending colon, stable since prior examination. There is background moderate pancolonic diverticulosis. Together, the findings suggest changes of mild acute or subacute diverticulitis. There is no evidence of obstruction. No free intraperitoneal gas or fluid to suggest perforation. No loculated intra-abdominal fluid collections. No active gastrointestinal hemorrhage is identified. Lymphatic: No pathologic adenopathy within the abdomen and pelvis. Reproductive: Status post hysterectomy. No adnexal masses. Other: No abdominal wall hernia Musculoskeletal: No acute bone abnormality. No lytic or blastic bone lesion is identified. IMPRESSION: VASCULAR No evidence of abdominal aortic aneurysm or dissection. No active gastrointestinal hemorrhage or culprit lesion identified. NON-VASCULAR Extensive coronary artery calcification.  Mild cardiomegaly. Cholelithiasis. Stable central probable intraductal pancreatic calcification with distal atrophy of the pancreas likely related to chronic obstruction. Pancolonic diverticulosis. Superimposed stable mild distal descending colonic inflammatory change most in keeping with very mild acute or subacute diverticulitis. No evidence of obstruction or perforation. No loculated intra-abdominal fluid collections identified. Aortic Atherosclerosis (ICD10-I70.0). Electronically Signed   By: Fidela Salisbury M.D.   On: 06/09/2021 02:43        Scheduled Meds:  acidophilus  2 capsule Oral TID   famotidine  20 mg Oral Daily   lactose free nutrition  237 mL Oral Daily   multivitamin with minerals  1 tablet Oral Daily  Muscle Rub  1 application Topical Daily   Continuous Infusions:  sodium chloride 75 mL/hr at 06/10/21 0814     LOS: 0 days    Time spent: 35 minutes    Tessica Cupo A Adem Costlow, MD [facility]   If 7PM-7AM, please contact night-coverage [email or web address]  06/10/2021, 10:12 AM

## 2021-06-10 NOTE — Progress Notes (Signed)
Mobility Specialist Progress Note:   06/10/21 1100  Mobility  Range of Motion/Exercises Active;All extremities  Level of Assistance Independent  Mobility Sit up in bed/chair position for meals  Mobility Response Tolerated well  Mobility performed by Mobility specialist  $Mobility charge 1 Mobility   Pt refused OOB d/t fear of worsening GI bleed. Agreed to bed exercises. Performed multiple bed exercises, as well as repositioned in bed. Pt left with all needs met.   Nelta Numbers Mobility Specialist  Phone [phone]

## 2021-06-10 NOTE — Progress Notes (Addendum)
Daily Rounding Note  06/10/2021, 1:24 PM  LOS: 0 days   SUBJECTIVE:   Chief complaint:    Dark, burgundy colored hematochezia.  Patient frustrated that we have not been able to solve her problems of GI bleeding which date back "20 years".  She is ready to go to [facility] to get some answers.  OBJECTIVE:         Vital signs in last 24 hours:    Temp:  [98 F (36.7 C)-98.7 F (37.1 C)] 98.3 F (36.8 C) (12/05 1020) Pulse Rate:  [72-101] 84 (12/05 1020) Resp:  [16-18] 16 (12/05 1020) BP: (97-159)/(50-99) 137/58 (12/05 1020) SpO2:  [99 %-100 %] 100 % (12/05 1020) Last BM Date: 06/09/21 Filed Weights   06/08/21 0847  Weight: 58.1 kg   General: NAD.  Thin but looks well. Heart: RRR. Chest: No labored breathing or cough. Abdomen: Soft, not tender or distended.  Active bowel sounds. Extremities: No CCE. Neuro/Psych: Oriented x3.  No gross weakness, deficits or tremors.  Intake/Output from previous day: 12/04 0701 - 12/05 0700 In: 2473.6 [I.V.:1487; Blood:986.7] Out: 1000 [Urine:1000]  Intake/Output this shift: No intake/output data recorded.  Lab Results: Recent Labs    06/09/21 2243 06/10/21 0806 06/10/21 1110  WBC 5.8 5.7 6.8  HGB 6.9* 10.1* 10.4*  HCT 19.4* 28.7* 30.4*  PLT 135* PLATELET CLUMPS NOTED ON SMEAR, COUNT APPEARS DECREASED 134*   BMET Recent Labs    06/08/21 1231 06/10/21 0806  NA 139 136  K 3.2* 3.5  CL 105 108  CO2 26 22  GLUCOSE 125* 175*  BUN 9 10  CREATININE 0.78 0.65  CALCIUM 9.5 8.9   LFT Recent Labs    06/08/21 1231  PROT 5.2*  ALBUMIN 3.0*  AST 39  ALT 28  ALKPHOS 58  BILITOT 0.3   PT/INR Recent Labs    06/08/21 1231  LABPROT 14.0  INR 1.1   Hepatitis Panel No results for input(s): HEPBSAG, HCVAB, HEPAIGM, HEPBIGM in the last 72 hours.  Studies/Results: CT Angio Abd/Pel w/ and/or w/o  Result Date: 06/09/2021 CLINICAL DATA:  Gastrointestinal hemorrhage EXAM:  CTA ABDOMEN AND PELVIS WITHOUT AND WITH CONTRAST TECHNIQUE: Multidetector CT imaging of the abdomen and pelvis was performed using the standard protocol during bolus administration of intravenous contrast. Multiplanar reconstructed images and MIPs were obtained and reviewed to evaluate the vascular anatomy. CONTRAST:  32mL ISOVUE-370 IOPAMIDOL (ISOVUE-370) INJECTION 76% COMPARISON:  06/02/2021 FINDINGS: VASCULAR Aorta: No aneurysm or dissection. Mild atherosclerotic calcification. No periaortic inflammatory change. Celiac: Patent without evidence of aneurysm, dissection, vasculitis or significant stenosis. SMA: Patent without evidence of aneurysm, dissection, vasculitis or significant stenosis. Renals: Both renal arteries are patent without evidence of aneurysm, dissection, vasculitis, fibromuscular dysplasia or significant stenosis. IMA: Patent without evidence of aneurysm, dissection, vasculitis or significant stenosis. Inflow: Patent without evidence of aneurysm, dissection, vasculitis or significant stenosis. Proximal Outflow: Bilateral common femoral and visualized portions of the superficial and profunda femoral arteries are patent without evidence of aneurysm, dissection, vasculitis or significant stenosis. Veins: Unremarkable Review of the MIP images confirms the above findings. NON-VASCULAR Lower chest: Visualized lung bases are clear. Extensive multi-vessel coronary artery calcification. Cardiac size is mildly enlarged. Hepatobiliary: Liver is unremarkable. Cholelithiasis noted without pericholecystic inflammatory change. No intra hepatic biliary ductal dilation. Stable mild extrahepatic biliary ductal dilation Pancreas: Dense calcification in the region of the central pancreatic duct within the pancreatic head is unchanged measuring 12 mm. Distally the, the  pancreatic duct is dilated and there is marked atrophy of the pancreatic parenchyma. Spleen: Unremarkable Adrenals/Urinary Tract: The adrenal glands  are unremarkable. Multiple cortical and parapelvic cysts are identified within the kidneys. No intrarenal or ureteral calculi are identified. No hydronephrosis. No enhancing intrarenal masses. The bladder is mildly distended but is otherwise unremarkable. Stomach/Bowel: Inflammatory changes are again identified involving the mid to distal descending colon, stable since prior examination. There is background moderate pancolonic diverticulosis. Together, the findings suggest changes of mild acute or subacute diverticulitis. There is no evidence of obstruction. No free intraperitoneal gas or fluid to suggest perforation. No loculated intra-abdominal fluid collections. No active gastrointestinal hemorrhage is identified. Lymphatic: No pathologic adenopathy within the abdomen and pelvis. Reproductive: Status post hysterectomy. No adnexal masses. Other: No abdominal wall hernia Musculoskeletal: No acute bone abnormality. No lytic or blastic bone lesion is identified. IMPRESSION: VASCULAR No evidence of abdominal aortic aneurysm or dissection. No active gastrointestinal hemorrhage or culprit lesion identified. NON-VASCULAR Extensive coronary artery calcification.  Mild cardiomegaly. Cholelithiasis. Stable central probable intraductal pancreatic calcification with distal atrophy of the pancreas likely related to chronic obstruction. Pancolonic diverticulosis. Superimposed stable mild distal descending colonic inflammatory change most in keeping with very mild acute or subacute diverticulitis. No evidence of obstruction or perforation. No loculated intra-abdominal fluid collections identified. Aortic Atherosclerosis (ICD10-I70.0). Electronically Signed   By: Fidela Salisbury M.D.   On: 06/09/2021 02:43    Scheduled Meds:  acidophilus  2 capsule Oral TID   famotidine  20 mg Oral Daily   lactose free nutrition  237 mL Oral Daily   multivitamin with minerals  1 tablet Oral Daily   Muscle Rub  1 application Topical Daily    Continuous Infusions:  sodium chloride 75 mL/hr at 06/10/21 0814   PRN Meds:.hydrALAZINE  ASSESMENT:    *   Stuttering, presumed diverticular bleed.  11/27 - 11/29  admission last week for hematochezia, presumed diverticular bleed, minor L sided abd pain.  CTAP/angio negative for active bleeding or source of bleeding.  MRI showed left-sided distal colitis, possible diverticulitis raising some suspicion for ischemic colitis.  Completed 3 days of Rocephin, Flagyl, no antibiotics at discharge.  06/09/2021 CTAP/angio: Pan colonic diverticulosis, stable mild distal descending colon inflammation, query mild acute or subacute diverticulitis.  She is not currently receiving antibiotics and no fevers or leukocytosis.   On Pepcid 20 mg daily.  See GI consult note of 06/03/21 for list of past GI procedures and issues     Blood loss anemia.  Hgb last week went from 9.8 ..  7.9 .. 8.4 at discharge, never transfused.  This admission Hgb 8.4 .. 6.9  .. 3 PRBCs ... 10.4.    Pancreatic head calcification, known entity, stable per recent imaging.   PLAN     Per Dr Loletha Carrow.      See no reason she needs to be NPO so allowing solid food.       EGD, Colonoscopy, SBE, VCE?       Azucena Freed  06/10/2021, 1:24 PM Phone [phone]   I have discussed the case with the APP, and that is the plan I formulated. I personally interviewed and examined the patient.  Chart review of recent hospitalization performed, signout received from Dr. Benson Norway. Patient has recurrent lower GI bleeding believed to be diverticular.  It has occurred several times in the past including 2021 when there was a positive CTA but negative abdominal angiogram, thus no intervention could be performed.  Negative CTA last week and again this admission.  There is been question of diverticulitis or ischemic colitis based on radiologist report of some inflammation in the left colon, but overall clinical picture not entirely consistent with that.   She did receive a course of antibiotics in case there was ischemic colitis. Overall, believe this is behaving like diverticular bleeding.  I was not able to get much of a history from her because she is very upset today regarding several things.  She feels we have done nothing for her over many years to stop this recurrent bleeding, wonders if she should be transferred to another hospital, wants to know why her diet has been started and stopped, and was very upset about being moved from another floor to this one which she says is much noisier.  As near as I can determine, she is not having any abdominal pain, she may have passed some dark stool earlier today but that is the only history I can get from her.  Hemoglobin is stable on 2 checks today after transfusion yesterday.  We have ordered her regular diet, but she says she is not sure she feels much like eating because she is too upset.  At this point, I believe the yield of repeat colonoscopy would be low.  If she has passage of copious bright red blood, she should go for another CT angiogram to see if it can be caught.  If positive, call interventional radiology for ASAP abdominal angiogram in hopes of identifying and controlling the bleeding source.  I also spoke to the charge nurse and asked them to address this patient's questions and concerns regarding why she was moved from another floor.  Nelida Meuse III Office: [phone]

## 2021-06-11 ENCOUNTER — Inpatient Hospital Stay (HOSPITAL_COMMUNITY): Payer: Medicare Other

## 2021-06-11 ENCOUNTER — Encounter (HOSPITAL_COMMUNITY): Payer: Self-pay | Admitting: Internal Medicine

## 2021-06-11 DIAGNOSIS — K922 Gastrointestinal hemorrhage, unspecified: Secondary | ICD-10-CM | POA: Diagnosis not present

## 2021-06-11 LAB — CBC
HCT: 25.9 % — ABNORMAL LOW (ref 36.0–46.0)
HCT: 27.3 % — ABNORMAL LOW (ref 36.0–46.0)
HCT: 29.2 % — ABNORMAL LOW (ref 36.0–46.0)
Hemoglobin: 8.9 g/dL — ABNORMAL LOW (ref 12.0–15.0)
Hemoglobin: 9.5 g/dL — ABNORMAL LOW (ref 12.0–15.0)
Hemoglobin: 9.8 g/dL — ABNORMAL LOW (ref 12.0–15.0)
MCH: 28.3 pg (ref 26.0–34.0)
MCH: 28.3 pg (ref 26.0–34.0)
MCH: 27.8 pg (ref 26.0–34.0)
MCHC: 34.4 g/dL (ref 30.0–36.0)
MCHC: 34.8 g/dL (ref 30.0–36.0)
MCHC: 33.6 g/dL (ref 30.0–36.0)
MCV: 82.2 fL (ref 80.0–100.0)
MCV: 81.3 fL (ref 80.0–100.0)
MCV: 82.7 fL (ref 80.0–100.0)
Platelets: 126 10*3/uL — ABNORMAL LOW (ref 150–400)
Platelets: 142 10*3/uL — ABNORMAL LOW (ref 150–400)
Platelets: 166 10*3/uL (ref 150–400)
RBC: 3.15 MIL/uL — ABNORMAL LOW (ref 3.87–5.11)
RBC: 3.36 MIL/uL — ABNORMAL LOW (ref 3.87–5.11)
RBC: 3.53 MIL/uL — ABNORMAL LOW (ref 3.87–5.11)
RDW: 17 % — ABNORMAL HIGH (ref 11.5–15.5)
RDW: 17.2 % — ABNORMAL HIGH (ref 11.5–15.5)
RDW: 17.6 % — ABNORMAL HIGH (ref 11.5–15.5)
WBC: 5.7 10*3/uL (ref 4.0–10.5)
WBC: 5.9 10*3/uL (ref 4.0–10.5)
WBC: 7 10*3/uL (ref 4.0–10.5)
nRBC: 0 % (ref 0.0–0.2)
nRBC: 0 % (ref 0.0–0.2)
nRBC: 0.4 % — ABNORMAL HIGH (ref 0.0–0.2)

## 2021-06-11 LAB — BASIC METABOLIC PANEL
Anion gap: 4 — ABNORMAL LOW (ref 5–15)
BUN: 11 mg/dL (ref 8–23)
CO2: 25 mmol/L (ref 22–32)
Calcium: 8.7 mg/dL — ABNORMAL LOW (ref 8.9–10.3)
Chloride: 109 mmol/L (ref 98–111)
Creatinine, Ser: 0.6 mg/dL (ref 0.44–1.00)
GFR, Estimated: >60 mL/min (ref >60)
Glucose, Bld: 99 mg/dL (ref 70–99)
Potassium: 3 mmol/L — ABNORMAL LOW (ref 3.5–5.1)
Sodium: 138 mmol/L (ref 135–145)

## 2021-06-11 LAB — PREPARE RBC (CROSSMATCH)

## 2021-06-11 MED ORDER — POTASSIUM CHLORIDE CRYS ER 20 MEQ PO TBCR
40.0000 meq | EXTENDED_RELEASE_TABLET | Freq: Once | ORAL | Status: AC
  Administered 2021-06-11: 40 meq via ORAL
  Filled 2021-06-11: qty 2

## 2021-06-11 MED ORDER — IOHEXOL 350 MG/ML SOLN
90.0000 mL | Freq: Once | INTRAVENOUS | Status: AC | PRN
  Administered 2021-06-11: 90 mL via INTRAVENOUS

## 2021-06-11 MED ORDER — PEG-KCL-NACL-NASULF-NA ASC-C 100 G PO SOLR
0.5000 | Freq: Once | ORAL | Status: AC
  Administered 2021-06-11: 100 g via ORAL
  Filled 2021-06-11: qty 1

## 2021-06-11 MED ORDER — SODIUM CHLORIDE 0.9% IV SOLUTION: Freq: Once | INTRAVENOUS | Status: DC

## 2021-06-11 MED ORDER — DIPHENHYDRAMINE HCL 50 MG/ML IJ SOLN: 25.0000 mg | Freq: Every day | INTRAMUSCULAR | Status: DC | PRN

## 2021-06-11 MED ORDER — BISACODYL 5 MG PO TBEC
20.0000 mg | DELAYED_RELEASE_TABLET | Freq: Once | ORAL | Status: AC
  Administered 2021-06-11: 20 mg via ORAL
  Filled 2021-06-11: qty 4

## 2021-06-11 MED ORDER — PEG-KCL-NACL-NASULF-NA ASC-C 100 G PO SOLR: 1.0000 | Freq: Once | ORAL | Status: DC

## 2021-06-11 MED ORDER — METOCLOPRAMIDE HCL 5 MG/ML IJ SOLN
10.0000 mg | Freq: Four times a day (QID) | INTRAMUSCULAR | Status: AC
  Administered 2021-06-11 (×2): 10 mg via INTRAVENOUS
  Filled 2021-06-11 (×2): qty 2

## 2021-06-11 MED ORDER — SODIUM CHLORIDE 0.9 % IV BOLUS
500.0000 mL | Freq: Once | INTRAVENOUS | Status: AC
  Administered 2021-06-11: 500 mL via INTRAVENOUS

## 2021-06-11 NOTE — Progress Notes (Signed)
Mobility Specialist Progress Note   06/11/21 1608  Mobility  Activity Transferred to/from [facility]  Level of Assistance Independent after set-up  Assistive Device None  Distance Ambulated (ft) 2 ft  Mobility Out of bed for toileting  Mobility Response Tolerated well  Mobility performed by Mobility specialist  $Mobility charge 1 Mobility   Received pt requesting to use [facility] for a BM in preparation for colonoscopy tomorrow. Pt reporting no symptoms but is still actively discharging dark red blood from rectum. RN notified, pt then assisted in pericare and returned back to bed w/ call bell by side and all needs met.       Mobility Specialist Phone Number [phone]  

## 2021-06-11 NOTE — Plan of Care (Signed)

## 2021-06-11 NOTE — Progress Notes (Signed)
PROGRESS NOTE    Latoya Bennett  PPI:[id number] DOB: [birth year] DOA: 06/08/2021 PCP: Armanda Heritage, NP   Brief Narrative: 85 year old with past medical history significant for repeated lower GI bleed secondary to diverticulosis, hypertension, knee osteoarthritis, GERD who presented with recurrent diverticular bleeding.  Patient recently discharged from the hospital 11/29 treated for GI bleed at that time. Presented with recurrent bloody bowel movement.  Hemoglobin on admission was 7.4.  She received 1 unit of packed red blood cells on admission.    Assessment & Plan:   Principal Problem:   Lower GI bleed Active Problems:   GI bleed  1-GI bleed: Presumed diverticular bleed: -patient presents with recurrent bloody stool. -CTA abdomen negative for active bleeding -She has received 3 units PRBC so far this admission.  -Hb drop to 6.9 last night. She had repeated CTA 12/04  negative for bleeding. Received 2 units PRBC>  -Had large blood BM this am---CTA 12/06 negative for active extravasation.  -Repeated hb 9.5. Continue to monitor CBC, transfusion as needed.  -Clear liquid diet.  Continue to monitor in progressive care unit.  GI planning Colonoscopy tomorrow.   2-Syncope: Vasovagal in the setting of GI bleed.  Received IV fluid and transfusion.  Recent colitis: Denies abdominal pain diarrhea. Osteoarthritis: Hold Voltaren gel. Hypokalemia; Replaced orally.    HTN:  PRN hydralazine.   Estimated body mass index is 32.04 kg/m as calculated from the following:   Height as of this encounter: 4\' 5"  (1.346 m).   Weight as of this encounter: 58.1 kg.   DVT prophylaxis: SCD Code Status: Full Code Family Communication: Monica updated.  Disposition Plan:  Status is: Observation  The patient remains OBS appropriate and will d/c before 2 midnights.       Consultants:  GI, Dr Benson Norway   Procedures:  None  Antimicrobials:    Subjective: She had bright blood per  rectum, purwick was bloody  Objective: Vitals:   06/11/21 0000 06/11/21 0040 06/11/21 0447 06/11/21 0800  BP: (!) 120/42 (!) 120/48 (!) 102/50 (!) 148/48  Pulse: 84 90 90   Resp: 20 (!) 21 (!) 22 (!) 22  Temp:      TempSrc:      SpO2: 99% 98% 97% 100%  Weight:      Height:        Intake/Output Summary (Last 24 hours) at 06/11/2021 0814 Last data filed at 06/11/2021 0745 Gross per 24 hour  Intake 938.44 ml  Output 1500 ml  Net -561.56 ml    Filed Weights   06/08/21 0847  Weight: 58.1 kg    Examination:  General exam: NAD Respiratory system: CTA Cardiovascular system: S 1, S 2 RRR Gastrointestinal system: BS present, soft, nt Central nervous system: Non focal.  Extremities: Symmetric power    Data Reviewed: I have personally reviewed following labs and imaging studies  CBC: Recent Labs  Lab 06/08/21 1231 06/09/21 0119 06/09/21 2243 06/10/21 0806 06/10/21 1110 06/10/21 1816 06/11/21 0215  WBC 4.9   < > 5.8 5.7 6.8 6.0 5.7  NEUTROABS 3.5  --   --   --   --   --   --   HGB 7.4*   < > 6.9* 10.1* 10.4* 10.3* 8.9*  HCT 22.4*   < > 19.4* 28.7* 30.4* 29.0* 25.9*  MCV 76.7*   < > 78.2* 81.8 81.1 80.8 82.2  PLT 179   < > 135* PLATELET CLUMPS NOTED ON SMEAR, COUNT APPEARS DECREASED 134* 141* 126*   < > =  values in this interval not displayed.    Basic Metabolic Panel: Recent Labs  Lab 06/08/21 1231 06/10/21 0806  NA 139 136  K 3.2* 3.5  CL 105 108  CO2 26 22  GLUCOSE 125* 175*  BUN 9 10  CREATININE 0.78 0.65  CALCIUM 9.5 8.9  MG  --  1.8    GFR: Estimated Creatinine Clearance: 34.4 mL/min (by C-G formula based on SCr of 0.65 mg/dL). Liver Function Tests: Recent Labs  Lab 06/08/21 1231  AST 39  ALT 28  ALKPHOS 58  BILITOT 0.3  PROT 5.2*  ALBUMIN 3.0*    No results for input(s): LIPASE, AMYLASE in the last 168 hours.  No results for input(s): AMMONIA in the last 168 hours. Coagulation Profile: Recent Labs  Lab 06/08/21 1231  INR 1.1     Cardiac Enzymes: No results for input(s): CKTOTAL, CKMB, CKMBINDEX, TROPONINI in the last 168 hours. BNP (last 3 results) No results for input(s): PROBNP in the last 8760 hours. HbA1C: No results for input(s): HGBA1C in the last 72 hours. CBG: No results for input(s): GLUCAP in the last 168 hours. Lipid Profile: No results for input(s): CHOL, HDL, LDLCALC, TRIG, CHOLHDL, LDLDIRECT in the last 72 hours. Thyroid Function Tests: No results for input(s): TSH, T4TOTAL, FREET4, T3FREE, THYROIDAB in the last 72 hours. Anemia Panel: No results for input(s): VITAMINB12, FOLATE, FERRITIN, TIBC, IRON, RETICCTPCT in the last 72 hours. Sepsis Labs: No results for input(s): PROCALCITON, LATICACIDVEN in the last 168 hours.  Recent Results (from the past 240 hour(s))  Resp Panel by RT-PCR (Flu A&B, Covid) Nasopharyngeal Swab     Status: None   Collection Time: 06/02/21  9:42 PM   Specimen: Nasopharyngeal Swab; Nasopharyngeal(NP) swabs in vial transport medium  Result Value Ref Range Status   SARS Coronavirus 2 by RT PCR NEGATIVE NEGATIVE Final    Comment: (NOTE) SARS-CoV-2 target nucleic acids are NOT DETECTED.  The SARS-CoV-2 RNA is generally detectable in upper respiratory specimens during the acute phase of infection. The lowest concentration of SARS-CoV-2 viral copies this assay can detect is 138 copies/mL. A negative result does not preclude SARS-Cov-2 infection and should not be used as the sole basis for treatment or other patient management decisions. A negative result may occur with  improper specimen collection/handling, submission of specimen other than nasopharyngeal swab, presence of viral mutation(s) within the areas targeted by this assay, and inadequate number of viral copies(<138 copies/mL). A negative result must be combined with clinical observations, patient history, and epidemiological information. The expected result is Negative.  Fact Sheet for Patients:   EntrepreneurPulse.com.au  Fact Sheet for Healthcare Providers:  IncredibleEmployment.be  This test is no t yet approved or cleared by the [place] FDA and  has been authorized for detection and/or diagnosis of SARS-CoV-2 by FDA under an Emergency Use Authorization (EUA). This EUA will remain  in effect (meaning this test can be used) for the duration of the COVID-19 declaration under Section 564(b)(1) of the Act, 21 U.S.C.section 360bbb-3(b)(1), unless the authorization is terminated  or revoked sooner.       Influenza A by PCR NEGATIVE NEGATIVE Final   Influenza B by PCR NEGATIVE NEGATIVE Final    Comment: (NOTE) The Xpert Xpress SARS-CoV-2/FLU/RSV plus assay is intended as an aid in the diagnosis of influenza from Nasopharyngeal swab specimens and should not be used as a sole basis for treatment. Nasal washings and aspirates are unacceptable for Xpert Xpress SARS-CoV-2/FLU/RSV testing.  Fact Sheet for  Patients: EntrepreneurPulse.com.au  Fact Sheet for Healthcare Providers: IncredibleEmployment.be  This test is not yet approved or cleared by the [place] FDA and has been authorized for detection and/or diagnosis of SARS-CoV-2 by FDA under an Emergency Use Authorization (EUA). This EUA will remain in effect (meaning this test can be used) for the duration of the COVID-19 declaration under Section 564(b)(1) of the Act, 21 U.S.C. section 360bbb-3(b)(1), unless the authorization is terminated or revoked.  Performed at [facility], [place] [address] [place], [place] [address]           Radiology Studies: No results found.      Scheduled Meds:  sodium chloride   Intravenous Once   acidophilus  2 capsule Oral TID   famotidine  20 mg Oral Daily   multivitamin with minerals  1 tablet Oral Daily   Muscle Rub  1 application Topical Daily   Continuous Infusions:  sodium chloride  75 mL/hr at 06/11/21 0051   sodium chloride       LOS: 1 day    Time spent: 35 minutes    [place] Current A Alizia Greif, MD [facility]   If 7PM-7AM, please contact night-coverage [email or web address]  06/11/2021, 8:14 AM

## 2021-06-11 NOTE — Progress Notes (Addendum)
Daily Rounding Note  06/11/2021, 12:56 PM  LOS: 1 day   SUBJECTIVE:   Chief complaint:     Recurrent passing dark red blood ~ 8 AM, no associated hemodynamic instablity.  No active bleeding on repeat CT angio.  Hgb stable at recheck of 1020 this AM.    OBJECTIVE:         Vital signs in last 24 hours:    Temp:  [97.7 F (36.5 C)-98 F (36.7 C)] 98 F (36.7 C) (12/05 2024) Pulse Rate:  [84-94] 90 (12/06 0447) Resp:  [19-26] 22 (12/06 0800) BP: (102-165)/(42-74) 148/48 (12/06 0800) SpO2:  [97 %-100 %] 100 % (12/06 0800) Last BM Date: 06/10/21 (BM mixed with blood) Filed Weights   06/08/21 0847  Weight: 58.1 kg   General: somewhat frail.  comfortable   Heart: RRR Chest: clear bil.  No dyspnea or cough Abdomen: soft, NT, ND.  BS active  Extremities: no CCE Neuro/Psych:  pleasant, calmer than yesterday PM.  Fluid speech.  No confusion.    Intake/Output from previous day: 12/05 0701 - 12/06 0700 In: 938.4 [I.V.:938.4] Out: 800 [Urine:800]  Intake/Output this shift: Total I/O In: -  Out: 1550 [Urine:1550]  Lab Results: Recent Labs    06/10/21 1816 06/11/21 0215 06/11/21 1019  WBC 6.0 5.7 5.9  HGB 10.3* 8.9* 9.5*  HCT 29.0* 25.9* 27.3*  PLT 141* 126* 142*   BMET Recent Labs    06/10/21 0806 06/11/21 1019  NA 136 138  K 3.5 3.0*  CL 108 109  CO2 22 25  GLUCOSE 175* 99  BUN 10 11  CREATININE 0.65 0.60  CALCIUM 8.9 8.7*   LFT No results for input(s): PROT, ALBUMIN, AST, ALT, ALKPHOS, BILITOT, BILIDIR, IBILI in the last 72 hours. PT/INR No results for input(s): LABPROT, INR in the last 72 hours. Hepatitis Panel No results for input(s): HEPBSAG, HCVAB, HEPAIGM, HEPBIGM in the last 72 hours.  Studies/Results: CT ANGIO GI BLEED  Result Date: 06/11/2021 CLINICAL DATA:  Rectal bleeding. EXAM: CTA ABDOMEN AND PELVIS WITHOUT AND WITH CONTRAST TECHNIQUE: Multidetector CT imaging of the abdomen and  pelvis was performed using the standard protocol during bolus administration of intravenous contrast. Multiplanar reconstructed images and MIPs were obtained and reviewed to evaluate the vascular anatomy. CONTRAST:  50mL OMNIPAQUE IOHEXOL 350 MG/ML SOLN COMPARISON:  CT AP, 06/09/2021 and 06/02/2021. FINDINGS: VASCULAR Aorta: A mild burden of aortic atherosclerosis present. Normal caliber aorta without aneurysm, dissection, vasculitis or significant stenosis. Celiac: Widely patent without evidence of aneurysm, dissection, vasculitis or significant stenosis. SMA: Widely patent without evidence of aneurysm, dissection, vasculitis or significant stenosis. Renals: Single bilateral renal arteries are present. Both renal arteries are patent without evidence of aneurysm, dissection, vasculitis, fibromuscular dysplasia or significant stenosis. IMA: Widely patent without evidence of aneurysm, dissection, vasculitis or significant stenosis. Inflow: Patent without evidence of aneurysm, dissection, vasculitis or significant stenosis. Proximal Outflow: Bilateral common femoral and visualized portions of the superficial and profunda femoral arteries are patent without evidence of aneurysm, dissection, vasculitis or significant stenosis. Veins: No obvious venous abnormality within the limitations of this arterial phase study. Review of the MIP images confirms the above findings. NON-VASCULAR Lower chest: A small volume LEFT pleural effusion is present. A severe burden of multivessel coronary atherosclerosis, greatest within the LAD, is present. Hepatobiliary: No focal liver abnormality is seen. Dependent dense gallstones/sludge within a nondistended gallbladder. No gallbladder wall thickening, or biliary dilatation. Pancreas: Atrophic. Dystrophic calcification  at the proximal/central pancreatic duct, with post stenotic dilation of the pancreatic duct measuring up to 0.7 cm. Spleen: Normal in size without focal abnormality.  Adrenals/Urinary Tract: Adrenal glands are unremarkable. 1.0 cm LEFT mid polar and addition additional subcentimeter bilateral renal cortical lesions are too small to adequately characterize though likely small cysts. Bilateral parapelvic cysts. No renal calculi, or hydronephrosis. Bladder is markedly distended. Stomach/Bowel: Stomach is within normal limits. Appendix is not definitively visualized. Nonobstructed small bowel. Nondilated colon. A severe burden of colonic diverticulosis, greatest within the ascending and proximal transverse colon is present. No intraluminal contrast is appreciated. No evidence of bowel wall thickening, distention, or inflammatory changes. Lymphatic: No enlarged abdominal or pelvic lymph nodes. Reproductive: Status post hysterectomy. No adnexal masses. Other: No abdominal wall hernia or abnormality. No abdominopelvic ascites. Musculoskeletal: Ischial ossific enthesopathy. Multilevel degenerative change of imaged spine. No acute osseous findings. IMPRESSION: VASCULAR 1. No acute vascular findings within the abdomen or pelvis. 2. No CTA evidence of active extravasation. NON-VASCULAR 1. A severe burden of colonic diverticulosis is present. This most likely represents the etiology of the patient's reported lower GI bleed. 2. Marked distention of the urinary bladder. 3. Intraductal pancreatic calcification with distal atrophy and pancreatic duct dilation, likely postobstructive. Attention on follow-up. 4. Cholelithiasis. Additional chronic and senescent findings, as above. Electronically Signed   By: Michaelle Birks M.D.   On: 06/11/2021 10:09    ASSESMENT:     LGIB, presumed diverticular.  Repeat admission within 1 week for same.  No active bleeding isolated on CT angio x .  Left-sided colitis vs diverticulitis on MRI  Blood loss anemia.  Hgb 10.3 yest AM, 8.9 at 0215 this AM .. 9.5 at 10:20 this AM.    Pancreatic ductal calcification with dilated PD.  Not a new finding.     PLAN      Pt agreeable to proceed w colonoscopy tmrw.   Will add possible upper endoscopy to consent form in case MD decides to pursue based on colon findings.      Azucena Freed  06/11/2021, 12:56 PM Phone [phone]   I have discussed the case with the APP, and that is the plan I formulated. I personally interviewed and examined the patient.  Patient appears to have continued slow lower GI bleeding while remaining hemodynamically stable.  Another negative CT angiogram, which will unfortunately add to patient's frustration over lack of clearly identified source of bleeding. I still believe bleeding is diverticular in nature.  Colonoscopy typically unable to identify culprit lesion, but given ongoing bleeding I feel we must proceed at this point.  Last colonoscopy for bleeding like this was 2 years ago.    Nelida Meuse III Office: [phone]

## 2021-06-11 NOTE — H&P (View-Only) (Signed)
Daily Rounding Note  06/11/2021, 12:56 PM  LOS: 1 day   SUBJECTIVE:   Chief complaint:     Recurrent passing dark red blood ~ 8 AM, no associated hemodynamic instablity.  No active bleeding on repeat CT angio.  Hgb stable at recheck of 1020 this AM.    OBJECTIVE:         Vital signs in last 24 hours:    Temp:  [97.7 F (36.5 C)-98 F (36.7 C)] 98 F (36.7 C) (12/05 2024) Pulse Rate:  [84-94] 90 (12/06 0447) Resp:  [19-26] 22 (12/06 0800) BP: (102-165)/(42-74) 148/48 (12/06 0800) SpO2:  [97 %-100 %] 100 % (12/06 0800) Last BM Date: 06/10/21 (BM mixed with blood) Filed Weights   06/08/21 0847  Weight: 58.1 kg   General: somewhat frail.  comfortable   Heart: RRR Chest: clear bil.  No dyspnea or cough Abdomen: soft, NT, ND.  BS active  Extremities: no CCE Neuro/Psych:  pleasant, calmer than yesterday PM.  Fluid speech.  No confusion.    Intake/Output from previous day: 12/05 0701 - 12/06 0700 In: 938.4 [I.V.:938.4] Out: 800 [Urine:800]  Intake/Output this shift: Total I/O In: -  Out: 1550 [Urine:1550]  Lab Results: Recent Labs    06/10/21 1816 06/11/21 0215 06/11/21 1019  WBC 6.0 5.7 5.9  HGB 10.3* 8.9* 9.5*  HCT 29.0* 25.9* 27.3*  PLT 141* 126* 142*   BMET Recent Labs    06/10/21 0806 06/11/21 1019  NA 136 138  K 3.5 3.0*  CL 108 109  CO2 22 25  GLUCOSE 175* 99  BUN 10 11  CREATININE 0.65 0.60  CALCIUM 8.9 8.7*   LFT No results for input(s): PROT, ALBUMIN, AST, ALT, ALKPHOS, BILITOT, BILIDIR, IBILI in the last 72 hours. PT/INR No results for input(s): LABPROT, INR in the last 72 hours. Hepatitis Panel No results for input(s): HEPBSAG, HCVAB, HEPAIGM, HEPBIGM in the last 72 hours.  Studies/Results: CT ANGIO GI BLEED  Result Date: 06/11/2021 CLINICAL DATA:  Rectal bleeding. EXAM: CTA ABDOMEN AND PELVIS WITHOUT AND WITH CONTRAST TECHNIQUE: Multidetector CT imaging of the abdomen and  pelvis was performed using the standard protocol during bolus administration of intravenous contrast. Multiplanar reconstructed images and MIPs were obtained and reviewed to evaluate the vascular anatomy. CONTRAST:  92mL OMNIPAQUE IOHEXOL 350 MG/ML SOLN COMPARISON:  CT AP, 06/09/2021 and 06/02/2021. FINDINGS: VASCULAR Aorta: A mild burden of aortic atherosclerosis present. Normal caliber aorta without aneurysm, dissection, vasculitis or significant stenosis. Celiac: Widely patent without evidence of aneurysm, dissection, vasculitis or significant stenosis. SMA: Widely patent without evidence of aneurysm, dissection, vasculitis or significant stenosis. Renals: Single bilateral renal arteries are present. Both renal arteries are patent without evidence of aneurysm, dissection, vasculitis, fibromuscular dysplasia or significant stenosis. IMA: Widely patent without evidence of aneurysm, dissection, vasculitis or significant stenosis. Inflow: Patent without evidence of aneurysm, dissection, vasculitis or significant stenosis. Proximal Outflow: Bilateral common femoral and visualized portions of the superficial and profunda femoral arteries are patent without evidence of aneurysm, dissection, vasculitis or significant stenosis. Veins: No obvious venous abnormality within the limitations of this arterial phase study. Review of the MIP images confirms the above findings. NON-VASCULAR Lower chest: A small volume LEFT pleural effusion is present. A severe burden of multivessel coronary atherosclerosis, greatest within the LAD, is present. Hepatobiliary: No focal liver abnormality is seen. Dependent dense gallstones/sludge within a nondistended gallbladder. No gallbladder wall thickening, or biliary dilatation. Pancreas: Atrophic. Dystrophic calcification  at the proximal/central pancreatic duct, with post stenotic dilation of the pancreatic duct measuring up to 0.7 cm. Spleen: Normal in size without focal abnormality.  Adrenals/Urinary Tract: Adrenal glands are unremarkable. 1.0 cm LEFT mid polar and addition additional subcentimeter bilateral renal cortical lesions are too small to adequately characterize though likely small cysts. Bilateral parapelvic cysts. No renal calculi, or hydronephrosis. Bladder is markedly distended. Stomach/Bowel: Stomach is within normal limits. Appendix is not definitively visualized. Nonobstructed small bowel. Nondilated colon. A severe burden of colonic diverticulosis, greatest within the ascending and proximal transverse colon is present. No intraluminal contrast is appreciated. No evidence of bowel wall thickening, distention, or inflammatory changes. Lymphatic: No enlarged abdominal or pelvic lymph nodes. Reproductive: Status post hysterectomy. No adnexal masses. Other: No abdominal wall hernia or abnormality. No abdominopelvic ascites. Musculoskeletal: Ischial ossific enthesopathy. Multilevel degenerative change of imaged spine. No acute osseous findings. IMPRESSION: VASCULAR 1. No acute vascular findings within the abdomen or pelvis. 2. No CTA evidence of active extravasation. NON-VASCULAR 1. A severe burden of colonic diverticulosis is present. This most likely represents the etiology of the patient's reported lower GI bleed. 2. Marked distention of the urinary bladder. 3. Intraductal pancreatic calcification with distal atrophy and pancreatic duct dilation, likely postobstructive. Attention on follow-up. 4. Cholelithiasis. Additional chronic and senescent findings, as above. Electronically Signed   By: Michaelle Birks M.D.   On: 06/11/2021 10:09    ASSESMENT:     LGIB, presumed diverticular.  Repeat admission within 1 week for same.  No active bleeding isolated on CT angio x .  Left-sided colitis vs diverticulitis on MRI  Blood loss anemia.  Hgb 10.3 yest AM, 8.9 at 0215 this AM .. 9.5 at 10:20 this AM.    Pancreatic ductal calcification with dilated PD.  Not a new finding.     PLAN      Pt agreeable to proceed w colonoscopy tmrw.   Will add possible upper endoscopy to consent form in case MD decides to pursue based on colon findings.      Azucena Freed  06/11/2021, 12:56 PM Phone [phone]   I have discussed the case with the APP, and that is the plan I formulated. I personally interviewed and examined the patient.  Patient appears to have continued slow lower GI bleeding while remaining hemodynamically stable.  Another negative CT angiogram, which will unfortunately add to patient's frustration over lack of clearly identified source of bleeding. I still believe bleeding is diverticular in nature.  Colonoscopy typically unable to identify culprit lesion, but given ongoing bleeding I feel we must proceed at this point.  Last colonoscopy for bleeding like this was 2 years ago.    Nelida Meuse III Office: [phone]

## 2021-06-11 NOTE — Progress Notes (Signed)
Noted  dark red blood coming from rectum. BP 148/48.  MD made aware and to bed side to assess. See new orders.

## 2021-06-11 NOTE — Telephone Encounter (Signed)
Pt is currently admitted again. Lm on vm for patient to return call.

## 2021-06-12 ENCOUNTER — Inpatient Hospital Stay (HOSPITAL_COMMUNITY): Payer: Medicare Other | Admitting: Certified Registered"

## 2021-06-12 ENCOUNTER — Encounter (HOSPITAL_COMMUNITY): Payer: Self-pay | Admitting: Internal Medicine

## 2021-06-12 ENCOUNTER — Encounter (HOSPITAL_COMMUNITY)
Admission: EM | Disposition: A | Discharge status: Home Health | Payer: Self-pay | Source: Home / Self Care | Attending: Internal Medicine

## 2021-06-12 DIAGNOSIS — K922 Gastrointestinal hemorrhage, unspecified: Secondary | ICD-10-CM | POA: Diagnosis not present

## 2021-06-12 DIAGNOSIS — K6289 Other specified diseases of anus and rectum: Secondary | ICD-10-CM

## 2021-06-12 HISTORY — PX: COLONOSCOPY WITH PROPOFOL: SHX5780

## 2021-06-12 LAB — CBC
HCT: 23.3 % — ABNORMAL LOW (ref 36.0–46.0)
HCT: 25.5 % — ABNORMAL LOW (ref 36.0–46.0)
Hemoglobin: 7.8 g/dL — ABNORMAL LOW (ref 12.0–15.0)
Hemoglobin: 8.9 g/dL — ABNORMAL LOW (ref 12.0–15.0)
MCH: 27.8 pg (ref 26.0–34.0)
MCH: 28.9 pg (ref 26.0–34.0)
MCHC: 33.5 g/dL (ref 30.0–36.0)
MCHC: 34.9 g/dL (ref 30.0–36.0)
MCV: 82.9 fL (ref 80.0–100.0)
MCV: 82.8 fL (ref 80.0–100.0)
Platelets: 132 10*3/uL — ABNORMAL LOW (ref 150–400)
Platelets: 153 10*3/uL (ref 150–400)
RBC: 2.81 MIL/uL — ABNORMAL LOW (ref 3.87–5.11)
RBC: 3.08 MIL/uL — ABNORMAL LOW (ref 3.87–5.11)
RDW: 17.9 % — ABNORMAL HIGH (ref 11.5–15.5)
RDW: 18.2 % — ABNORMAL HIGH (ref 11.5–15.5)
WBC: 4.2 10*3/uL (ref 4.0–10.5)
WBC: 4.5 10*3/uL (ref 4.0–10.5)
nRBC: 0 % (ref 0.0–0.2)
nRBC: 0 % (ref 0.0–0.2)

## 2021-06-12 LAB — TYPE AND SCREEN
ABO/RH(D): O POS
Antibody Screen: NEGATIVE
Unit division: 0
Unit division: 0
Unit division: 0
Unit division: 0

## 2021-06-12 LAB — BPAM RBC
Blood Product Expiration Date: 202212082359
Blood Product Expiration Date: 202212112359
Blood Product Expiration Date: 202212292359
Blood Product Expiration Date: 202301012359
ISSUE DATE / TIME: 202212031747
ISSUE DATE / TIME: 202212050004
ISSUE DATE / TIME: 202212050311
Unit Type and Rh: 5100
Unit Type and Rh: 5100
Unit Type and Rh: 5100
Unit Type and Rh: 9500

## 2021-06-12 SURGERY — COLONOSCOPY WITH PROPOFOL
Anesthesia: Monitor Anesthesia Care

## 2021-06-12 MED ORDER — LACTATED RINGERS IV SOLN
INTRAVENOUS | Status: AC | PRN
  Administered 2021-06-12: 1000 mL via INTRAVENOUS

## 2021-06-12 MED ORDER — EPHEDRINE SULFATE 50 MG/ML IJ SOLN
INTRAMUSCULAR | Status: DC | PRN
  Administered 2021-06-12: 5 mg via INTRAVENOUS

## 2021-06-12 MED ORDER — PROPOFOL 500 MG/50ML IV EMUL
INTRAVENOUS | Status: DC | PRN
  Administered 2021-06-12: 50 ug/kg/min via INTRAVENOUS

## 2021-06-12 MED ORDER — BOOST / RESOURCE BREEZE PO LIQD CUSTOM
1.0000 | Freq: Three times a day (TID) | ORAL | Status: DC
  Administered 2021-06-12 (×2): 1 via ORAL
  Filled 2021-06-12 (×2): qty 1

## 2021-06-12 MED ORDER — LACTATED RINGERS IV SOLN: INTRAVENOUS | Status: DC | PRN

## 2021-06-12 MED ORDER — PHENYLEPHRINE HCL-NACL 20-0.9 MG/250ML-% IV SOLN
INTRAVENOUS | Status: DC | PRN
  Administered 2021-06-12: 50 ug/min via INTRAVENOUS

## 2021-06-12 SURGICAL SUPPLY — 25 items

## 2021-06-12 NOTE — Transfer of Care (Signed)
Immediate Anesthesia Transfer of Care Note  Patient: Latoya Bennett  Procedure(s) Performed: ESOPHAGOGASTRODUODENOSCOPY (EGD) WITH PROPOFOL COLONOSCOPY WITH PROPOFOL  Patient Location: PACU  Anesthesia Type:MAC  Level of Consciousness: drowsy and patient cooperative  Airway & Oxygen Therapy: Patient Spontanous Breathing and Patient connected to nasal cannula oxygen  Post-op Assessment: Report given to RN and Post -op Vital signs reviewed and stable  Post vital signs: Reviewed and stable  Last Vitals:  Vitals Value Taken Time  BP 108/48 06/12/21 1452  Temp    Pulse 91 06/12/21 1453  Resp 24 06/12/21 1453  SpO2 99 % 06/12/21 1453  Vitals shown include unvalidated device data.  Last Pain:  Vitals:   06/12/21 1238  TempSrc: Temporal  PainSc: 0-No pain      Patients Stated Pain Goal: 0 (02/33/43 5686)  Complications: No notable events documented.

## 2021-06-12 NOTE — Progress Notes (Signed)
Mobility Specialist Progress Note   06/12/21 1530  Mobility  Activity  (Bed Mobility Exercises)  Level of Assistance Minimal assist, patient does 75% or more  Assistive Device Front wheel walker  Mobility Response Tolerated well  Mobility performed by Mobility specialist  $Mobility charge 1 Mobility   Received pt in bed refusing ambulation today but agreeable to bed mobility exercises. Pt's mobilizes very well and ambulates independently but was just tired at this time. Pt able to participate in all exercises w/ no breaks and no problems. Left w/ call bell by side and needs met.  Holland Falling Mobility Specialist Phone Number [phone]

## 2021-06-12 NOTE — Progress Notes (Signed)
PROGRESS NOTE    Latoya Bennett  OQH:[id number] DOB: 04/20/1936 DOA: 06/08/2021 PCP: Armanda Heritage, NP   Brief Narrative: 85 year old with past medical history significant for repeated lower GI bleed secondary to diverticulosis, hypertension, knee osteoarthritis, GERD who presented with recurrent diverticular bleeding.  Patient recently discharged from the hospital 11/29 treated for GI bleed at that time. Presented with recurrent bloody bowel movement.  Hemoglobin on admission was 7.4.  She received 1 unit of packed red blood cells on admission.    Assessment & Plan:   Recurrent diverticular bleed -Patient reports long history of same, multiple hospitalizations in [place] too, >10  -CTA abdomen x2 -unrevealing -Transfused 3 units of PRBC thus far -Hemoglobin down to 7.8, continue to trend -Gastroenterology following, plan for colonoscopy today -may need surgical eval, recurrent diverticular bleeds  Syncope: Vasovagal in the setting of GI bleed.  Osteoarthritis: Hold Voltaren gel.  Hypokalemia;  -Clinically  HTN:  PRN hydralazine.   Estimated body mass index is 32.04 kg/m as calculated from the following:   Height as of this encounter: 4\' 5"  (1.346 m).   Weight as of this encounter: 58.1 kg.   DVT prophylaxis: SCDs Code Status: Full Code Family Communication: Discussed patient in detail, no family at bedside Disposition Plan:  Status is: Inpatient: Due to severity of illness     Consultants:  GI, Dr Benson Norway   Procedures:  None  Antimicrobials:    Subjective: -Frustrated about ongoing diverticular bleeding, no episodes overnight, awaiting colonoscopy  Objective: Vitals:   06/11/21 2009 06/11/21 2110 06/11/21 2149 06/12/21 0759  BP: 125/63   132/73  Pulse: 90   87  Resp: 17 16 18 17   Temp: 98.3 F (36.8 C)   98.6 F (37 C)  TempSrc: Oral     SpO2: 100%   97%  Weight:      Height:        Intake/Output Summary (Last 24 hours) at 06/12/2021  1011 Last data filed at 06/11/2021 2110 Gross per 24 hour  Intake 1172.56 ml  Output 850 ml  Net 322.56 ml   Filed Weights   06/08/21 0847  Weight: 58.1 kg    Examination:  Gen: Awake, Alert, Oriented X 3, irritable HEENT: no JVD Lungs: Good air movement bilaterally, CTAB CVS: S1S2/RRR Abd: soft, Non tender, non distended, BS present Extremities: No edema Skin: no new rashes on exposed skin     Data Reviewed: I have personally reviewed following labs and imaging studies  CBC: Recent Labs  Lab 06/08/21 1231 06/09/21 0119 06/10/21 1816 06/11/21 0215 06/11/21 1019 06/11/21 1633 06/12/21 0446  WBC 4.9   < > 6.0 5.7 5.9 7.0 4.2  NEUTROABS 3.5  --   --   --   --   --   --   HGB 7.4*   < > 10.3* 8.9* 9.5* 9.8* 7.8*  HCT 22.4*   < > 29.0* 25.9* 27.3* 29.2* 23.3*  MCV 76.7*   < > 80.8 82.2 81.3 82.7 82.9  PLT 179   < > 141* 126* 142* 166 132*   < > = values in this interval not displayed.   Basic Metabolic Panel: Recent Labs  Lab 06/08/21 1231 06/10/21 0806 06/11/21 1019  NA 139 136 138  K 3.2* 3.5 3.0*  CL 105 108 109  CO2 26 22 25   GLUCOSE 125* 175* 99  BUN 9 10 11   CREATININE 0.78 0.65 0.60  CALCIUM 9.5 8.9 8.7*  MG  --  1.8  --  GFR: Estimated Creatinine Clearance: 34.4 mL/min (by C-G formula based on SCr of 0.6 mg/dL). Liver Function Tests: Recent Labs  Lab 06/08/21 1231  AST 39  ALT 28  ALKPHOS 58  BILITOT 0.3  PROT 5.2*  ALBUMIN 3.0*   No results for input(s): LIPASE, AMYLASE in the last 168 hours.  No results for input(s): AMMONIA in the last 168 hours. Coagulation Profile: Recent Labs  Lab 06/08/21 1231  INR 1.1   Cardiac Enzymes: No results for input(s): CKTOTAL, CKMB, CKMBINDEX, TROPONINI in the last 168 hours. BNP (last 3 results) No results for input(s): PROBNP in the last 8760 hours. HbA1C: No results for input(s): HGBA1C in the last 72 hours. CBG: No results for input(s): GLUCAP in the last 168 hours. Lipid Profile: No  results for input(s): CHOL, HDL, LDLCALC, TRIG, CHOLHDL, LDLDIRECT in the last 72 hours. Thyroid Function Tests: No results for input(s): TSH, T4TOTAL, FREET4, T3FREE, THYROIDAB in the last 72 hours. Anemia Panel: No results for input(s): VITAMINB12, FOLATE, FERRITIN, TIBC, IRON, RETICCTPCT in the last 72 hours. Sepsis Labs: No results for input(s): PROCALCITON, LATICACIDVEN in the last 168 hours.  Recent Results (from the past 240 hour(s))  Resp Panel by RT-PCR (Flu A&B, Covid) Nasopharyngeal Swab     Status: None   Collection Time: 06/02/21  9:42 PM   Specimen: Nasopharyngeal Swab; Nasopharyngeal(NP) swabs in vial transport medium  Result Value Ref Range Status   SARS Coronavirus 2 by RT PCR NEGATIVE NEGATIVE Final    Comment: (NOTE) SARS-CoV-2 target nucleic acids are NOT DETECTED.  The SARS-CoV-2 RNA is generally detectable in upper respiratory specimens during the acute phase of infection. The lowest concentration of SARS-CoV-2 viral copies this assay can detect is 138 copies/mL. A negative result does not preclude SARS-Cov-2 infection and should not be used as the sole basis for treatment or other patient management decisions. A negative result may occur with  improper specimen collection/handling, submission of specimen other than nasopharyngeal swab, presence of viral mutation(s) within the areas targeted by this assay, and inadequate number of viral copies(<138 copies/mL). A negative result must be combined with clinical observations, patient history, and epidemiological information. The expected result is Negative.  Fact Sheet for Patients:  EntrepreneurPulse.com.au  Fact Sheet for Healthcare Providers:  IncredibleEmployment.be  This test is no t yet approved or cleared by the [place] FDA and  has been authorized for detection and/or diagnosis of SARS-CoV-2 by FDA under an Emergency Use Authorization (EUA). This EUA will remain   in effect (meaning this test can be used) for the duration of the COVID-19 declaration under Section 564(b)(1) of the Act, 21 U.S.C.section 360bbb-3(b)(1), unless the authorization is terminated  or revoked sooner.       Influenza A by PCR NEGATIVE NEGATIVE Final   Influenza B by PCR NEGATIVE NEGATIVE Final    Comment: (NOTE) The Xpert Xpress SARS-CoV-2/FLU/RSV plus assay is intended as an aid in the diagnosis of influenza from Nasopharyngeal swab specimens and should not be used as a sole basis for treatment. Nasal washings and aspirates are unacceptable for Xpert Xpress SARS-CoV-2/FLU/RSV testing.  Fact Sheet for Patients: EntrepreneurPulse.com.au  Fact Sheet for Healthcare Providers: IncredibleEmployment.be  This test is not yet approved or cleared by the [place] FDA and has been authorized for detection and/or diagnosis of SARS-CoV-2 by FDA under an Emergency Use Authorization (EUA). This EUA will remain in effect (meaning this test can be used) for the duration of the COVID-19 declaration under Section 564(b)(1)  of the Act, 21 U.S.C. section 360bbb-3(b)(1), unless the authorization is terminated or revoked.  Performed at [facility], [place] [address] [place], [place] [address]           Radiology Studies: CT ANGIO GI BLEED  Result Date: 06/11/2021 CLINICAL DATA:  Rectal bleeding. EXAM: CTA ABDOMEN AND PELVIS WITHOUT AND WITH CONTRAST TECHNIQUE: Multidetector CT imaging of the abdomen and pelvis was performed using the standard protocol during bolus administration of intravenous contrast. Multiplanar reconstructed images and MIPs were obtained and reviewed to evaluate the vascular anatomy. CONTRAST:  60mL OMNIPAQUE IOHEXOL 350 MG/ML SOLN COMPARISON:  CT AP, 06/09/2021 and 06/02/2021. FINDINGS: VASCULAR Aorta: A mild burden of aortic atherosclerosis present. Normal caliber aorta without aneurysm, dissection, vasculitis or  significant stenosis. Celiac: Widely patent without evidence of aneurysm, dissection, vasculitis or significant stenosis. SMA: Widely patent without evidence of aneurysm, dissection, vasculitis or significant stenosis. Renals: Single bilateral renal arteries are present. Both renal arteries are patent without evidence of aneurysm, dissection, vasculitis, fibromuscular dysplasia or significant stenosis. IMA: Widely patent without evidence of aneurysm, dissection, vasculitis or significant stenosis. Inflow: Patent without evidence of aneurysm, dissection, vasculitis or significant stenosis. Proximal Outflow: Bilateral common femoral and visualized portions of the superficial and profunda femoral arteries are patent without evidence of aneurysm, dissection, vasculitis or significant stenosis. Veins: No obvious venous abnormality within the limitations of this arterial phase study. Review of the MIP images confirms the above findings. NON-VASCULAR Lower chest: A small volume LEFT pleural effusion is present. A severe burden of multivessel coronary atherosclerosis, greatest within the LAD, is present. Hepatobiliary: No focal liver abnormality is seen. Dependent dense gallstones/sludge within a nondistended gallbladder. No gallbladder wall thickening, or biliary dilatation. Pancreas: Atrophic. Dystrophic calcification at the proximal/central pancreatic duct, with post stenotic dilation of the pancreatic duct measuring up to 0.7 cm. Spleen: Normal in size without focal abnormality. Adrenals/Urinary Tract: Adrenal glands are unremarkable. 1.0 cm LEFT mid polar and addition additional subcentimeter bilateral renal cortical lesions are too small to adequately characterize though likely small cysts. Bilateral parapelvic cysts. No renal calculi, or hydronephrosis. Bladder is markedly distended. Stomach/Bowel: Stomach is within normal limits. Appendix is not definitively visualized. Nonobstructed small bowel. Nondilated colon. A  severe burden of colonic diverticulosis, greatest within the ascending and proximal transverse colon is present. No intraluminal contrast is appreciated. No evidence of bowel wall thickening, distention, or inflammatory changes. Lymphatic: No enlarged abdominal or pelvic lymph nodes. Reproductive: Status post hysterectomy. No adnexal masses. Other: No abdominal wall hernia or abnormality. No abdominopelvic ascites. Musculoskeletal: Ischial ossific enthesopathy. Multilevel degenerative change of imaged spine. No acute osseous findings. IMPRESSION: VASCULAR 1. No acute vascular findings within the abdomen or pelvis. 2. No CTA evidence of active extravasation. NON-VASCULAR 1. A severe burden of colonic diverticulosis is present. This most likely represents the etiology of the patient's reported lower GI bleed. 2. Marked distention of the urinary bladder. 3. Intraductal pancreatic calcification with distal atrophy and pancreatic duct dilation, likely postobstructive. Attention on follow-up. 4. Cholelithiasis. Additional chronic and senescent findings, as above. Electronically Signed   By: Michaelle Birks M.D.   On: 06/11/2021 10:09        Scheduled Meds:  sodium chloride   Intravenous Once   acidophilus  2 capsule Oral TID   famotidine  20 mg Oral Daily   multivitamin with minerals  1 tablet Oral Daily   Muscle Rub  1 application Topical Daily   Continuous Infusions:  sodium chloride 100  mL/hr at 06/11/21 1229     LOS: 2 days    Time spent: 25 minutes    Domenic Polite, MD [facility]  06/12/2021, 10:11 AM

## 2021-06-12 NOTE — Anesthesia Postprocedure Evaluation (Signed)
Anesthesia Post Note  Patient: Latoya Bennett  Procedure(s) Performed: ESOPHAGOGASTRODUODENOSCOPY (EGD) WITH PROPOFOL COLONOSCOPY WITH PROPOFOL     Patient location during evaluation: Endoscopy Anesthesia Type: MAC Level of consciousness: awake Pain management: pain level controlled Respiratory status: spontaneous breathing Cardiovascular status: stable Postop Assessment: no apparent nausea or vomiting Anesthetic complications: no   No notable events documented.  Last Vitals:  Vitals:   06/12/21 1515 06/12/21 1557  BP: (!) 142/59 (!) 149/72  Pulse: 85 92  Resp: (!) 23 16  Temp:  36.6 C  SpO2: 99% 100%    Last Pain:  Vitals:   06/12/21 1515  TempSrc:   PainSc: 0-No pain                 Malie Kashani

## 2021-06-12 NOTE — Anesthesia Preprocedure Evaluation (Addendum)
Anesthesia Evaluation  Patient identified by MRN, date of birth, ID band Patient awake    Reviewed: Allergy & Precautions, NPO status , Patient's Chart, lab work & pertinent test results  Airway Mallampati: II  TM Distance: >3 FB     Dental   Pulmonary neg pulmonary ROS,    breath sounds clear to auscultation       Cardiovascular hypertension,  Rhythm:Regular Rate:Normal     Neuro/Psych negative neurological ROS     GI/Hepatic Neg liver ROS, GERD  ,Hx noted Dr. Nyoka Cowden   Endo/Other  negative endocrine ROS  Renal/GU negative Renal ROS     Musculoskeletal  (+) Arthritis ,   Abdominal   Peds  Hematology   Anesthesia Other Findings   Reproductive/Obstetrics                             Anesthesia Physical Anesthesia Plan  ASA: 3  Anesthesia Plan: MAC   Post-op Pain Management:    Induction: Intravenous  PONV Risk Score and Plan: 2 and Ondansetron and Propofol infusion  Airway Management Planned: Simple Face Mask and Nasal Cannula  Additional Equipment:   Intra-op Plan:   Post-operative Plan:   Informed Consent: I have reviewed the patients History and Physical, chart, labs and discussed the procedure including the risks, benefits and alternatives for the proposed anesthesia with the patient or authorized representative who has indicated his/her understanding and acceptance.     Dental advisory given  Plan Discussed with: CRNA and Anesthesiologist  Anesthesia Plan Comments:         Anesthesia Quick Evaluation

## 2021-06-12 NOTE — Interval H&P Note (Signed)
History and Physical Interval Note:  06/12/2021 2:09 PM  [facility]  has presented today for surgery, with the diagnosis of [place] hematochezia.  Anemia.  CT raising concern for colitis..  The various methods of treatment have been discussed with the patient and family. After consideration of risks, benefits and other options for treatment, the patient has consented to  Procedure(s): ESOPHAGOGASTRODUODENOSCOPY (EGD) WITH PROPOFOL (N/A) COLONOSCOPY WITH PROPOFOL (N/A) as a surgical intervention.  The patient's history has been reviewed, patient examined, no change in status, stable for surgery.  I have reviewed the patient's chart and labs.  Questions were answered to the patient's satisfaction.    Patient hemodynamically stable, bowel prep quality difficult to discern from her description.  Proceeding with colonoscopy. Nelida Meuse III

## 2021-06-12 NOTE — Op Note (Signed)
[facility] Patient Name: Latoya Bennett Procedure Date : 06/12/2021 MRN: [id number] Attending MD: Estill Cotta. Loletha Carrow , MD Date of Birth: 01/20/1936 CSN: [id number] Age: 85 Admit Type: Inpatient Procedure:                Colonoscopy Indications:              Hematochezia, Acute post hemorrhagic anemia                           recurrent diverticular bleeding                           admission last week, managed conservatively.                            readmitted, CTA neg x 2 this admission. last                            colonoscopy Aug 2020 in same setting Providers:                Mallie Mussel L. Loletha Carrow, MD, Jeanella Cara, RN,                            Luan Moore, Carolan Shiver, CRNA, Referring MD:             [facility] Medicines:                Monitored Anesthesia Care Complications:            No immediate complications. Estimated Blood Loss:     Estimated blood loss: none. Procedure:                Pre-Anesthesia Assessment:                           - Prior to the procedure, a History and Physical                            was performed, and patient medications and                            allergies were reviewed. The patient's tolerance of                            previous anesthesia was also reviewed. The risks                            and benefits of the procedure and the sedation                            options and risks were discussed with the patient.                            All questions were answered, and informed consent                            was obtained. Prior  Anticoagulants: The patient has                            taken no previous anticoagulant or antiplatelet                            agents. ASA Grade Assessment: III - A patient with                            severe systemic disease. After reviewing the risks                            and benefits, the patient was deemed in                             satisfactory condition to undergo the procedure.                           After obtaining informed consent, the colonoscope                            was passed under direct vision. Throughout the                            procedure, the patient's blood pressure, pulse, and                            oxygen saturations were monitored continuously. The                            PCF-190TL ([id number]) Olympus colonoscope was                            introduced through the anus and advanced to the the                            terminal ileum, with identification of the                            appendiceal orifice and IC valve. The colonoscopy                            was performed without difficulty. The patient                            tolerated the procedure well. The quality of the                            bowel preparation was fair (right colon better than                            left with lavage). The terminal ileum, ileocecal  valve, appendiceal orifice, and rectum were                            photographed (technical problem lost rectal                            retroflexion image). The bowel preparation used was                            MoviPrep. Scope In: 2:30:23 PM Scope Out: 2:45:09 PM Scope Withdrawal Time: 0 hours 10 minutes 36 seconds  Total Procedure Duration: 0 hours 14 minutes 46 seconds  Findings:      The digital rectal exam findings include decreased sphincter tone.      The terminal ileum appeared normal.      Many diverticula were found in the entire colon. (L>R). No fresh or old       blood in colon. No stigmata of bleeding on any diverticular openings.      The exam was otherwise without abnormality on direct and retroflexion       views. Impression:               - Preparation of the colon was fair.                           - Decreased sphincter tone found on digital rectal                            exam.                            - The examined portion of the ileum was normal.                           - Diverticulosis in the entire examined colon.                           - The examination was otherwise normal on direct                            and retroflexion views.                           - No specimens collected.                           Unable to localize culprit diverticulum by imaging                            or colonoscopy for this episode of bleeding. Moderate Sedation:      MAC sedation used Recommendation:           - Return patient to hospital ward for ongoing care.                           - Resume regular diet.                           -  CBC in AM Procedure Code(s):        --- Professional ---                           [id number], Colonoscopy, flexible; diagnostic, including                            collection of specimen(s) by brushing or washing,                            when performed (separate procedure) Diagnosis Code(s):        --- Professional ---                           K62.89, Other specified diseases of anus and rectum                           K92.1, Melena (includes Hematochezia)                           D62, Acute posthemorrhagic anemia                           K57.30, Diverticulosis of large intestine without                            perforation or abscess without bleeding CPT copyright 2019 American Medical Association. All rights reserved. The codes documented in this report are preliminary and upon coder review may  be revised to meet current compliance requirements. Cindee Mclester L. Loletha Carrow, MD 06/12/2021 3:04:58 PM This report has been signed electronically. Number of Addenda: 0

## 2021-06-13 DIAGNOSIS — K922 Gastrointestinal hemorrhage, unspecified: Secondary | ICD-10-CM | POA: Diagnosis not present

## 2021-06-13 LAB — CBC
HCT: 23.3 % — ABNORMAL LOW (ref 36.0–46.0)
HCT: 26.4 % — ABNORMAL LOW (ref 36.0–46.0)
Hemoglobin: 7.7 g/dL — ABNORMAL LOW (ref 12.0–15.0)
Hemoglobin: 8.7 g/dL — ABNORMAL LOW (ref 12.0–15.0)
MCH: 28.1 pg (ref 26.0–34.0)
MCH: 27.5 pg (ref 26.0–34.0)
MCHC: 33 g/dL (ref 30.0–36.0)
MCHC: 33 g/dL (ref 30.0–36.0)
MCV: 85 fL (ref 80.0–100.0)
MCV: 83.5 fL (ref 80.0–100.0)
Platelets: 138 10*3/uL — ABNORMAL LOW (ref 150–400)
Platelets: 159 10*3/uL (ref 150–400)
RBC: 2.74 MIL/uL — ABNORMAL LOW (ref 3.87–5.11)
RBC: 3.16 MIL/uL — ABNORMAL LOW (ref 3.87–5.11)
RDW: 18.3 % — ABNORMAL HIGH (ref 11.5–15.5)
RDW: 17.8 % — ABNORMAL HIGH (ref 11.5–15.5)
WBC: 4.3 10*3/uL (ref 4.0–10.5)
WBC: 5 10*3/uL (ref 4.0–10.5)
nRBC: 0 % (ref 0.0–0.2)
nRBC: 0 % (ref 0.0–0.2)

## 2021-06-13 LAB — BASIC METABOLIC PANEL
Anion gap: 7 (ref 5–15)
BUN: 11 mg/dL (ref 8–23)
CO2: 22 mmol/L (ref 22–32)
Calcium: 9.2 mg/dL (ref 8.9–10.3)
Chloride: 109 mmol/L (ref 98–111)
Creatinine, Ser: 0.62 mg/dL (ref 0.44–1.00)
GFR, Estimated: >60 mL/min (ref >60)
Glucose, Bld: 107 mg/dL — ABNORMAL HIGH (ref 70–99)
Potassium: 3.1 mmol/L — ABNORMAL LOW (ref 3.5–5.1)
Sodium: 138 mmol/L (ref 135–145)

## 2021-06-13 MED ORDER — COVID-19 MRNA VAC-TRIS(PFIZER) 30 MCG/0.3ML IM SUSP
0.3000 mL | Freq: Once | INTRAMUSCULAR | Status: AC
  Administered 2021-06-14: 0.3 mL via INTRAMUSCULAR
  Filled 2021-06-13: qty 0.3

## 2021-06-13 MED ORDER — POTASSIUM CHLORIDE CRYS ER 20 MEQ PO TBCR
40.0000 meq | EXTENDED_RELEASE_TABLET | Freq: Once | ORAL | Status: AC
  Administered 2021-06-13: 40 meq via ORAL
  Filled 2021-06-13: qty 2

## 2021-06-13 MED ORDER — SODIUM CHLORIDE 0.9 % IV SOLN
250.0000 mg | Freq: Every day | INTRAVENOUS | Status: AC
  Administered 2021-06-13 – 2021-06-14 (×2): 250 mg via INTRAVENOUS
  Filled 2021-06-13 (×2): qty 20

## 2021-06-13 NOTE — Progress Notes (Signed)
Mobility Specialist Progress Note   06/13/21 1530  Mobility  Activity Refused mobility   Pt stating to be too tired to get up and walk. Also stated to have walked twice in the room this morning.   Holland Falling Mobility Specialist Phone Number [phone]

## 2021-06-13 NOTE — Progress Notes (Addendum)
     [facility] Progress Note  CC:  GI bleed  Subjective:  No further sign of bleeding since colonoscopy.  She is understandably frustrated but is very pleasant today.  Objective:  Vital signs in last 24 hours: Temp:  [97.3 F (36.3 C)-98.1 F (36.7 C)] 98.1 F (36.7 C) (12/08 0844) Pulse Rate:  [81-109] 109 (12/08 0844) Resp:  [16-23] 17 (12/08 0844) BP: (108-160)/(38-102) 151/102 (12/08 0844) SpO2:  [94 %-100 %] 94 % (12/08 0844) Last BM Date: 06/12/21 General:  Alert, Well-developed, in NAD Heart:  Slightly tachy; no murmurs Pulm:  CTAB.  No W/R/R. Abdomen:  Soft, non-distended.  BS present.  Non-tender. Extremities:  Without edema. Neurologic:  Alert and oriented x 4;  grossly normal neurologically. Psych:  Alert and cooperative. Normal mood and affect.  Intake/Output from previous day: 12/07 0701 - 12/08 0700 In: 200 [I.V.:200] Out: -   Lab Results: Recent Labs    06/12/21 0446 06/12/21 1644 06/13/21 0230  WBC 4.2 4.5 4.3  HGB 7.8* 8.9* 7.7*  HCT 23.3* 25.5* 23.3*  PLT 132* 153 138*   BMET Recent Labs    06/11/21 1019 06/13/21 0230  NA 138 138  K 3.0* 3.1*  CL 109 109  CO2 25 22  GLUCOSE 99 107*  BUN 11 11  CREATININE 0.60 0.62  CALCIUM 8.7* 9.2   Assessment / Plan: *LGIB, presumed diverticular.  Repeat admission within 1 week for same.  No active bleeding isolated on CT angio.  Colonoscopy 12/7 not able to identify culprit. *Blood loss anemia.  Has received 3 units PRBCs this admission.  Hgb trending down.  7.7 grams this AM from 8.9 grams yesterday. *Pancreatic ductal calcification with dilated PD.  Not a new finding.   -She is going to receive IV iron today which I think is a good idea. -Monitor Hgb and transfuse further prn.    LOS: 3 days   Laban Emperor. Zehr  06/13/2021, 12:56 PM  I have discussed the case with the APP, and that is the plan I formulated. I personally interviewed and examined the patient.  No recurrent  diverticular bleeding for over 36 hours. Hemoglobin stable overall accounting for some equilibration. Agree that if no recurrent bleeding by tomorrow morning, discharge home.  GI office follow-up as needed.  Nelida Meuse III Office: [phone]

## 2021-06-13 NOTE — Care Management Important Message (Signed)
Important Message  Patient Details  Name: Latoya Bennett MRN: [id number] Date of Birth: 04/09/1936   Medicare Important Message Given:  Yes     Hannah Beat 06/13/2021, 11:18 AM

## 2021-06-13 NOTE — Progress Notes (Signed)
PROGRESS NOTE    Latoya Bennett  RJJ:[id number] DOB: 14-Oct-1935 DOA: 06/08/2021 PCP: Armanda Heritage, NP   Brief Narrative: 85 year old with past medical history significant for repeated lower GI bleed secondary to diverticulosis, hypertension, knee osteoarthritis, GERD who presented with recurrent diverticular bleeding.  Patient recently discharged from the hospital 11/29 treated for GI bleed at that time. Presented with recurrent bloody bowel movement.  Hemoglobin on admission was 7.4.  She received 1 unit of packed red blood cells on admission.    Assessment & Plan:   Recurrent diverticular bleed -Patient reports long history of same, multiple hospitalizations in [place] too, >10  -CTA abdomen x2 -unrevealing -Transfused 3 units of PRBC thus far -Colonoscopy 12/7: Could not localize bleeding -Hemoglobin 7.7 this morning, will give IV iron, bleeding appears to have ceased for now, remains a bit tachycardic, will give IV iron -may need surgical eval, recurrent diverticular bleeds -Ambulate, CBC in a.m., home tomorrow if stable  Syncope: Vasovagal in the setting of GI bleed.  Osteoarthritis: Hold Voltaren gel.  Hypokalemia;  -Repleted  HTN:  PRN hydralazine.   Estimated body mass index is 32.04 kg/m as calculated from the following:   Height as of this encounter: 4\' 5"  (1.346 m).   Weight as of this encounter: 58.1 kg.   DVT prophylaxis: SCDs Code Status: Full Code Family Communication: Discussed patient in detail, no family at bedside Disposition Plan:  Status is: Inpatient: Due to severity of illness     Consultants:  GI, Dr Benson Norway   Procedures: Colonoscopy 12/7, no active bleeding noted   Antimicrobials:    Subjective: -Feels okay, no bleeding overnight, frustrated about our inability to fix her diverticular bleeding  Objective: Vitals:   06/12/21 2030 06/13/21 0034 06/13/21 0515 06/13/21 0844  BP: (!) 124/38 (!) 160/54 138/69 (!) 151/102   Pulse: 92 89 94 (!) 109  Resp: 19 (!) 22 (!) 22 17  Temp: 97.8 F (36.6 C) 97.9 F (36.6 C) 97.9 F (36.6 C) 98.1 F (36.7 C)  TempSrc: Oral Oral Oral   SpO2: 95% 100% 100% 94%  Weight:      Height:        Intake/Output Summary (Last 24 hours) at 06/13/2021 1132 Last data filed at 06/12/2021 1448 Gross per 24 hour  Intake 200 ml  Output --  Net 200 ml   Filed Weights   06/08/21 0847  Weight: 58.1 kg    Examination:  Gen: Gen: Awake, Alert, Oriented X 3,  HEENT: no JVD Lungs: Good air movement bilaterally, CTAB CVS: S1S2/RRR Abd: soft, Non tender, non distended, BS present Extremities: No edema Skin: no new rashes on exposed skin   Data Reviewed: I have personally reviewed following labs and imaging studies  CBC: Recent Labs  Lab 06/08/21 1231 06/09/21 0119 06/11/21 1019 06/11/21 1633 06/12/21 0446 06/12/21 1644 06/13/21 0230  WBC 4.9   < > 5.9 7.0 4.2 4.5 4.3  NEUTROABS 3.5  --   --   --   --   --   --   HGB 7.4*   < > 9.5* 9.8* 7.8* 8.9* 7.7*  HCT 22.4*   < > 27.3* 29.2* 23.3* 25.5* 23.3*  MCV 76.7*   < > 81.3 82.7 82.9 82.8 85.0  PLT 179   < > 142* 166 132* 153 138*   < > = values in this interval not displayed.   Basic Metabolic Panel: Recent Labs  Lab 06/08/21 1231 06/10/21 0806 06/11/21 1019 06/13/21 0230  NA  139 136 138 138  K 3.2* 3.5 3.0* 3.1*  CL 105 108 109 109  CO2 26 22 25 22   GLUCOSE 125* 175* 99 107*  BUN 9 10 11 11   CREATININE 0.78 0.65 0.60 0.62  CALCIUM 9.5 8.9 8.7* 9.2  MG  --  1.8  --   --    GFR: Estimated Creatinine Clearance: 34.4 mL/min (by C-G formula based on SCr of 0.62 mg/dL). Liver Function Tests: Recent Labs  Lab 06/08/21 1231  AST 39  ALT 28  ALKPHOS 58  BILITOT 0.3  PROT 5.2*  ALBUMIN 3.0*   No results for input(s): LIPASE, AMYLASE in the last 168 hours.  No results for input(s): AMMONIA in the last 168 hours. Coagulation Profile: Recent Labs  Lab 06/08/21 1231  INR 1.1   Cardiac Enzymes: No  results for input(s): CKTOTAL, CKMB, CKMBINDEX, TROPONINI in the last 168 hours. BNP (last 3 results) No results for input(s): PROBNP in the last 8760 hours. HbA1C: No results for input(s): HGBA1C in the last 72 hours. CBG: No results for input(s): GLUCAP in the last 168 hours. Lipid Profile: No results for input(s): CHOL, HDL, LDLCALC, TRIG, CHOLHDL, LDLDIRECT in the last 72 hours. Thyroid Function Tests: No results for input(s): TSH, T4TOTAL, FREET4, T3FREE, THYROIDAB in the last 72 hours. Anemia Panel: No results for input(s): VITAMINB12, FOLATE, FERRITIN, TIBC, IRON, RETICCTPCT in the last 72 hours. Sepsis Labs: No results for input(s): PROCALCITON, LATICACIDVEN in the last 168 hours.  No results found for this or any previous visit (from the past 240 hour(s)).      Scheduled Meds:  sodium chloride   Intravenous Once   acidophilus  2 capsule Oral TID   famotidine  20 mg Oral Daily   feeding supplement  1 Container Oral TID BM   multivitamin with minerals  1 tablet Oral Daily   Muscle Rub  1 application Topical Daily   Continuous Infusions:  ferric gluconate (FERRLECIT) IVPB       LOS: 3 days    Time spent: 25 minutes    Domenic Polite, MD [facility]  06/13/2021, 11:32 AM

## 2021-06-14 ENCOUNTER — Encounter (HOSPITAL_COMMUNITY): Payer: Self-pay | Admitting: Gastroenterology

## 2021-06-14 DIAGNOSIS — K922 Gastrointestinal hemorrhage, unspecified: Secondary | ICD-10-CM | POA: Diagnosis not present

## 2021-06-14 LAB — CBC
HCT: 22.9 % — ABNORMAL LOW (ref 36.0–46.0)
Hemoglobin: 7.7 g/dL — ABNORMAL LOW (ref 12.0–15.0)
MCH: 28.2 pg (ref 26.0–34.0)
MCHC: 33.6 g/dL (ref 30.0–36.0)
MCV: 83.9 fL (ref 80.0–100.0)
Platelets: 142 10*3/uL — ABNORMAL LOW (ref 150–400)
RBC: 2.73 MIL/uL — ABNORMAL LOW (ref 3.87–5.11)
RDW: 17.8 % — ABNORMAL HIGH (ref 11.5–15.5)
WBC: 3.5 10*3/uL — ABNORMAL LOW (ref 4.0–10.5)
nRBC: 0 % (ref 0.0–0.2)

## 2021-06-14 MED ORDER — FERROUS SULFATE 325 (65 FE) MG PO TABS: 325.0000 mg | ORAL_TABLET | Freq: Two times a day (BID) | ORAL | 1 refills | Status: DC

## 2021-06-14 NOTE — Discharge Summary (Signed)
Physician Discharge Summary  [place] HYQ:[id number] DOB: [birth year] DOA: 06/08/2021  PCP: Armanda Heritage, NP  Admit date: 06/08/2021 Discharge date: 06/14/2021  Time spent: 35 minutes  Recommendations for Outpatient Follow-up:  PCP 1 week, please check CBC at follow-up Consider general surgery referral for recurrent diverticular bleeding, if patient is agreeable   Discharge Diagnoses:  Principal Problem: Recurrent lower GI bleed Acute blood loss anemia Hypertension Osteoarthritis GERD  Discharge Condition: Stable  Diet recommendation: Low-sodium  Filed Weights   06/08/21 0847  Weight: 58.1 kg    History of present illness:  85 year old with past medical history significant for repeated lower GI bleed secondary to diverticulosis, hypertension, knee osteoarthritis, GERD who presented with recurrent diverticular bleeding.  Patient recently discharged from the hospital 11/29 treated for GI bleed at that time. Presented with recurrent bloody bowel movement.  Hemoglobin on admission was 7.4.  She received 1 unit of packed red blood cells on admission.   Hospital Course:   Recurrent diverticular bleed -Patient reports long history of same, multiple hospitalizations in [place] too, >10  -CTA abdomen x2 -unrevealing -Transfused 3 units of PRBC thus far -Colonoscopy 12/7: Could not localize bleeding -Hemoglobin stable in the 7.5-8 range, given 2 doses of IV iron, no further bleeding in the last 48 hours -Discharged home in a stable condition, recommended surgical evaluation as outpatient, she wants to get a second opinion from a different health system in [place]   Syncope: Vasovagal in the setting of GI bleed.   Osteoarthritis: Hold Voltaren gel.   Hypokalemia;  -Repleted   HTN: Stable, amlodipine discontinued    Discharge Exam: Vitals:   06/14/21 0800 06/14/21 0906  BP: 109/77 116/71  Pulse: 92 96  Resp: 15 20  Temp:  98.1 F (36.7 C)   SpO2: 98% 100%   : Gen: Awake, Alert, Oriented X 3,  HEENT: no JVD Lungs: Good air movement bilaterally, CTAB CVS: S1S2/RRR Abd: soft, Non tender, non distended, BS present Extremities: No edema Skin: no new rashes on exposed skin    Discharge Instructions   Discharge Instructions     Diet - low sodium heart healthy   Complete by: As directed    Increase activity slowly   Complete by: As directed       Allergies as of 06/14/2021   No Known Allergies      Medication List     STOP taking these medications    amLODipine 10 MG tablet Commonly known as: NORVASC       TAKE these medications    Biofreeze 4 % Gel Generic drug: Menthol (Topical Analgesic) Apply 1 application topically 2 (two) times daily.   diclofenac Sodium 1 % Gel Commonly known as: VOLTAREN Apply 2 g topically 4 (four) times daily. What changed:  when to take this reasons to take this   famotidine 20 MG tablet Commonly known as: Pepcid Take 1 tablet (20 mg total) by mouth daily. What changed: when to take this   lactose free nutrition Liqd Take 237 mLs by mouth daily.   feeding supplement Liqd Take 237 mLs by mouth 2 (two) times daily between meals.   ferrous sulfate 325 (65 FE) MG tablet Commonly known as: FeroSul Take 1 tablet (325 mg total) by mouth 2 (two) times daily with a meal. TAKE 2 TABLETS(650 MG) BY MOUTH EVERY OTHER DAY Strength: 325 (65 Fe) mg What changed:  medication strength See the new instructions.   multivitamin with minerals Tabs tablet Take 1  tablet by mouth daily.   Potassium 99 MG Tabs Take 99 mg by mouth every morning.       No Known Allergies  Follow-up Information     Armanda Heritage, NP. Schedule an appointment as soon as possible for a visit in 1 week(s).   Specialty: Nurse Practitioner Contact information: [place] [address] [phone]         Lavena Bullion, DO. Go in 1 month(s).   Specialty:  Gastroenterology Contact information: [place] [address] [address] [place], [place] Follow up.   Why: resumption of home health services will be provided by [facility], start of care within 48 hours post discharge Contact information: [place] [address] [phone]                  The results of significant diagnostics from this hospitalization (including imaging, microbiology, ancillary and laboratory) are listed below for reference.    Significant Diagnostic Studies: CT ABDOMEN PELVIS W CONTRAST  Result Date: 06/02/2021 CLINICAL DATA:  LLQ abdominal pain Left lower quadrant pain with rectal bleeding EXAM: CT ABDOMEN AND PELVIS WITH CONTRAST TECHNIQUE: Multidetector CT imaging of the abdomen and pelvis was performed using the standard protocol following bolus administration of intravenous contrast. CONTRAST:  30mL OMNIPAQUE IOHEXOL 300 MG/ML  SOLN COMPARISON:  CT abdomen pelvis 04/26/2020. MR abdomen 03/19/2019 FINDINGS: Lower chest: Coronary artery calcifications. Small left Bochdalek's hernia. Hepatobiliary: No focal liver abnormality. Layering hyperdensity consistent with tiny calcified gallstones within the gallbladder lumen. No gallbladder wall thickening or pericholecystic fluid. No biliary dilatation. Pancreas: Persistent 0.9 x 0.5cm calcified stone along the proximal pancreas with persistent associated chronic diffuse main pancreatic duct dilatation. Spleen: Normal in size without focal abnormality. Adrenals/Urinary Tract: No adrenal nodule bilaterally. Bilateral kidneys enhance symmetrically. Parapelvic renal cysts. Subcentimeter hypodensties are too small to characterize. No hydronephrosis. No hydroureter. The urinary bladder is unremarkable. On delayed imaging, there is no urothelial wall thickening and there are no filling defects in the opacified portions of the  bilateral collecting systems or ureters. Stomach/Bowel: Stomach is within normal limits. No evidence of bowel wall thickening or dilatation. Under distension of the distal descending/proximal sigmoid colon with query mild bowel wall thickening and pericolonic stranding. Scattered colonic diverticulosis. Appendix appears normal. Vascular/Lymphatic: No abdominal aorta or iliac aneurysm. Mild atherosclerotic plaque of the aorta and its branches. No abdominal, pelvic, or inguinal lymphadenopathy. Reproductive: Status post hysterectomy. No adnexal masses. Other: No intraperitoneal free fluid. No intraperitoneal free gas. No organized fluid collection. Musculoskeletal: No abdominal wall hernia or abnormality. No suspicious lytic or blastic osseous lesions. No acute displaced fracture. Multilevel severe degenerative changes of the spine. IMPRESSION: 1. Scattered colonic diverticulosis with possible developing distal descending colon/proximal sigmoid colon mild acute diverticulitis. Limited evaluation due to under distension of the colon. 2. Cholelithiasis no CT finding of acute cholecystitis. 3. Chronic pancreatic appearance with calcification and associated main pancreatic duct dilatation. 4. Aortic Atherosclerosis (ICD10-I70.0). Electronically Signed   By: Iven Finn M.D.   On: 06/02/2021 21:58   CT ANGIO GI BLEED  Result Date: 06/11/2021 CLINICAL DATA:  Rectal bleeding. EXAM: CTA ABDOMEN AND PELVIS WITHOUT AND WITH CONTRAST TECHNIQUE: Multidetector CT imaging of the abdomen and pelvis was performed using the standard protocol during bolus administration of intravenous contrast. Multiplanar reconstructed images and MIPs were obtained and reviewed to  evaluate the vascular anatomy. CONTRAST:  23mL OMNIPAQUE IOHEXOL 350 MG/ML SOLN COMPARISON:  CT AP, 06/09/2021 and 06/02/2021. FINDINGS: VASCULAR Aorta: A mild burden of aortic atherosclerosis present. Normal caliber aorta without aneurysm, dissection, vasculitis or  significant stenosis. Celiac: Widely patent without evidence of aneurysm, dissection, vasculitis or significant stenosis. SMA: Widely patent without evidence of aneurysm, dissection, vasculitis or significant stenosis. Renals: Single bilateral renal arteries are present. Both renal arteries are patent without evidence of aneurysm, dissection, vasculitis, fibromuscular dysplasia or significant stenosis. IMA: Widely patent without evidence of aneurysm, dissection, vasculitis or significant stenosis. Inflow: Patent without evidence of aneurysm, dissection, vasculitis or significant stenosis. Proximal Outflow: Bilateral common femoral and visualized portions of the superficial and profunda femoral arteries are patent without evidence of aneurysm, dissection, vasculitis or significant stenosis. Veins: No obvious venous abnormality within the limitations of this arterial phase study. Review of the MIP images confirms the above findings. NON-VASCULAR Lower chest: A small volume LEFT pleural effusion is present. A severe burden of multivessel coronary atherosclerosis, greatest within the LAD, is present. Hepatobiliary: No focal liver abnormality is seen. Dependent dense gallstones/sludge within a nondistended gallbladder. No gallbladder wall thickening, or biliary dilatation. Pancreas: Atrophic. Dystrophic calcification at the proximal/central pancreatic duct, with post stenotic dilation of the pancreatic duct measuring up to 0.7 cm. Spleen: Normal in size without focal abnormality. Adrenals/Urinary Tract: Adrenal glands are unremarkable. 1.0 cm LEFT mid polar and addition additional subcentimeter bilateral renal cortical lesions are too small to adequately characterize though likely small cysts. Bilateral parapelvic cysts. No renal calculi, or hydronephrosis. Bladder is markedly distended. Stomach/Bowel: Stomach is within normal limits. Appendix is not definitively visualized. Nonobstructed small bowel. Nondilated colon. A  severe burden of colonic diverticulosis, greatest within the ascending and proximal transverse colon is present. No intraluminal contrast is appreciated. No evidence of bowel wall thickening, distention, or inflammatory changes. Lymphatic: No enlarged abdominal or pelvic lymph nodes. Reproductive: Status post hysterectomy. No adnexal masses. Other: No abdominal wall hernia or abnormality. No abdominopelvic ascites. Musculoskeletal: Ischial ossific enthesopathy. Multilevel degenerative change of imaged spine. No acute osseous findings. IMPRESSION: VASCULAR 1. No acute vascular findings within the abdomen or pelvis. 2. No CTA evidence of active extravasation. NON-VASCULAR 1. A severe burden of colonic diverticulosis is present. This most likely represents the etiology of the patient's reported lower GI bleed. 2. Marked distention of the urinary bladder. 3. Intraductal pancreatic calcification with distal atrophy and pancreatic duct dilation, likely postobstructive. Attention on follow-up. 4. Cholelithiasis. Additional chronic and senescent findings, as above. Electronically Signed   By: Michaelle Birks M.D.   On: 06/11/2021 10:09   CT Angio Abd/Pel w/ and/or w/o  Result Date: 06/09/2021 CLINICAL DATA:  Gastrointestinal hemorrhage EXAM: CTA ABDOMEN AND PELVIS WITHOUT AND WITH CONTRAST TECHNIQUE: Multidetector CT imaging of the abdomen and pelvis was performed using the standard protocol during bolus administration of intravenous contrast. Multiplanar reconstructed images and MIPs were obtained and reviewed to evaluate the vascular anatomy. CONTRAST:  59mL ISOVUE-370 IOPAMIDOL (ISOVUE-370) INJECTION 76% COMPARISON:  06/02/2021 FINDINGS: VASCULAR Aorta: No aneurysm or dissection. Mild atherosclerotic calcification. No periaortic inflammatory change. Celiac: Patent without evidence of aneurysm, dissection, vasculitis or significant stenosis. SMA: Patent without evidence of aneurysm, dissection, vasculitis or significant  stenosis. Renals: Both renal arteries are patent without evidence of aneurysm, dissection, vasculitis, fibromuscular dysplasia or significant stenosis. IMA: Patent without evidence of aneurysm, dissection, vasculitis or significant stenosis. Inflow: Patent without evidence of aneurysm, dissection, vasculitis or significant stenosis. Proximal Outflow: Bilateral common  femoral and visualized portions of the superficial and profunda femoral arteries are patent without evidence of aneurysm, dissection, vasculitis or significant stenosis. Veins: Unremarkable Review of the MIP images confirms the above findings. NON-VASCULAR Lower chest: Visualized lung bases are clear. Extensive multi-vessel coronary artery calcification. Cardiac size is mildly enlarged. Hepatobiliary: Liver is unremarkable. Cholelithiasis noted without pericholecystic inflammatory change. No intra hepatic biliary ductal dilation. Stable mild extrahepatic biliary ductal dilation Pancreas: Dense calcification in the region of the central pancreatic duct within the pancreatic head is unchanged measuring 12 mm. Distally the, the pancreatic duct is dilated and there is marked atrophy of the pancreatic parenchyma. Spleen: Unremarkable Adrenals/Urinary Tract: The adrenal glands are unremarkable. Multiple cortical and parapelvic cysts are identified within the kidneys. No intrarenal or ureteral calculi are identified. No hydronephrosis. No enhancing intrarenal masses. The bladder is mildly distended but is otherwise unremarkable. Stomach/Bowel: Inflammatory changes are again identified involving the mid to distal descending colon, stable since prior examination. There is background moderate pancolonic diverticulosis. Together, the findings suggest changes of mild acute or subacute diverticulitis. There is no evidence of obstruction. No free intraperitoneal gas or fluid to suggest perforation. No loculated intra-abdominal fluid collections. No active  gastrointestinal hemorrhage is identified. Lymphatic: No pathologic adenopathy within the abdomen and pelvis. Reproductive: Status post hysterectomy. No adnexal masses. Other: No abdominal wall hernia Musculoskeletal: No acute bone abnormality. No lytic or blastic bone lesion is identified. IMPRESSION: VASCULAR No evidence of abdominal aortic aneurysm or dissection. No active gastrointestinal hemorrhage or culprit lesion identified. NON-VASCULAR Extensive coronary artery calcification.  Mild cardiomegaly. Cholelithiasis. Stable central probable intraductal pancreatic calcification with distal atrophy of the pancreas likely related to chronic obstruction. Pancolonic diverticulosis. Superimposed stable mild distal descending colonic inflammatory change most in keeping with very mild acute or subacute diverticulitis. No evidence of obstruction or perforation. No loculated intra-abdominal fluid collections identified. Aortic Atherosclerosis (ICD10-I70.0). Electronically Signed   By: Fidela Salisbury M.D.   On: 06/09/2021 02:43   CT Angio Abd/Pel W and/or Wo Contrast  Result Date: 06/02/2021 CLINICAL DATA:  GI bleed. EXAM: CTA ABDOMEN AND PELVIS WITHOUT AND WITH CONTRAST TECHNIQUE: Multidetector CT imaging of the abdomen and pelvis was performed using the standard protocol during bolus administration of intravenous contrast. Multiplanar reconstructed images and MIPs were obtained and reviewed to evaluate the vascular anatomy. CONTRAST:  62mL OMNIPAQUE IOHEXOL 350 MG/ML SOLN, 27mL OMNIPAQUE IOHEXOL 300 MG/ML SOLN COMPARISON:  June 02, 2021 FINDINGS: VASCULAR Aorta: Mild calcification and atherosclerosis without aneurysm, dissection, vasculitis or significant stenosis. Celiac: Patent without evidence of aneurysm, dissection, vasculitis or significant stenosis. SMA: Patent without evidence of aneurysm, dissection, vasculitis or significant stenosis. Renals: Both renal arteries are patent without evidence of aneurysm,  dissection, vasculitis, fibromuscular dysplasia or significant stenosis. IMA: Patent without evidence of aneurysm, dissection, vasculitis or significant stenosis. Inflow: Moderate to marked severity calcification without evidence of aneurysm, dissection, vasculitis or significant stenosis. Proximal Outflow: Bilateral common femoral and visualized portions of the superficial and profunda femoral arteries are patent without evidence of aneurysm, dissection, vasculitis or significant stenosis. Veins: No obvious venous abnormality within the limitations of this arterial phase study. Review of the MIP images confirms the above findings. NON-VASCULAR Lower chest: No acute abnormality. Hepatobiliary: No focal liver abnormality is seen. Numerous tiny gallstones are seen within the lumen of an otherwise normal-appearing gallbladder. Pancreas: A 1.1 cm focal calcification is seen within the pancreatic head, within the expected region of the distal pancreatic duct. Subsequent pancreatic ductal dilatation is noted (  approximately 6 mm). No inflammatory changes are identified. Spleen: Normal in size without focal abnormality. Adrenals/Urinary Tract: Adrenal glands are unremarkable. Kidneys are normal in size, without renal calculi or hydronephrosis. Bilateral parapelvic renal cysts are seen. Additional simple cysts are noted within the mid left kidney. Bladder is unremarkable. Stomach/Bowel: There is a small hiatal hernia. Appendix appears normal. No evidence of bowel dilatation. Noninflamed diverticula are noted throughout the large bowel. The mid to distal descending colon is moderately thickened and inflamed. This is stable in appearance when compared to the prior study Lymphatic: No abnormal abdominal or pelvic lymph nodes are identified. Reproductive: Status post hysterectomy. No adnexal masses. Other: No abdominal wall hernia or abnormality. No abdominopelvic ascites. Musculoskeletal: Marked severity multilevel degenerative  changes seen throughout the lumbar spine. IMPRESSION: 1. Moderate severity colitis involving the mid to distal descending colon. 2. Colonic diverticulosis. 3. Cholelithiasis without evidence of cholecystitis. 4. Stable 1.1 cm focal calcification within the pancreatic head, within the expected region of the distal pancreatic duct. 5. Small hiatal hernia. 6. Atherosclerosis of the abdominal aorta and arterial structures of the abdomen and pelvis, without evidence of aneurysmal dilatation, dissection or major vessel occlusion. Aortic Atherosclerosis (ICD10-I70.0). Electronically Signed   By: Virgina Norfolk M.D.   On: 06/02/2021 23:14    Microbiology: No results found for this or any previous visit (from the past 240 hour(s)).   Labs: Basic Metabolic Panel: Recent Labs  Lab 06/08/21 1231 06/10/21 0806 06/11/21 1019 06/13/21 0230  NA 139 136 138 138  K 3.2* 3.5 3.0* 3.1*  CL 105 108 109 109  CO2 26 22 25 22   GLUCOSE 125* 175* 99 107*  BUN 9 10 11 11   CREATININE 0.78 0.65 0.60 0.62  CALCIUM 9.5 8.9 8.7* 9.2  MG  --  1.8  --   --    Liver Function Tests: Recent Labs  Lab 06/08/21 1231  AST 39  ALT 28  ALKPHOS 58  BILITOT 0.3  PROT 5.2*  ALBUMIN 3.0*   No results for input(s): LIPASE, AMYLASE in the last 168 hours. No results for input(s): AMMONIA in the last 168 hours. CBC: Recent Labs  Lab 06/08/21 1231 06/09/21 0119 06/12/21 0446 06/12/21 1644 06/13/21 0230 06/13/21 1514 06/14/21 0222  WBC 4.9   < > 4.2 4.5 4.3 5.0 3.5*  NEUTROABS 3.5  --   --   --   --   --   --   HGB 7.4*   < > 7.8* 8.9* 7.7* 8.7* 7.7*  HCT 22.4*   < > 23.3* 25.5* 23.3* 26.4* 22.9*  MCV 76.7*   < > 82.9 82.8 85.0 83.5 83.9  PLT 179   < > 132* 153 138* 159 142*   < > = values in this interval not displayed.   Cardiac Enzymes: No results for input(s): CKTOTAL, CKMB, CKMBINDEX, TROPONINI in the last 168 hours. BNP: BNP (last 3 results) No results for input(s): BNP in the last 8760  hours.  ProBNP (last 3 results) No results for input(s): PROBNP in the last 8760 hours.  CBG: No results for input(s): GLUCAP in the last 168 hours.     Signed:  Domenic Polite MD.  [facility] 06/14/2021, 2:18 PM

## 2021-06-14 NOTE — Progress Notes (Signed)
AVS provided and reviewed with pt. All questions answered at this time. Pt discharged home with friend via private vehicle.

## 2021-06-14 NOTE — Plan of Care (Signed)

## 2021-06-16 ENCOUNTER — Other Ambulatory Visit: Payer: Self-pay | Admitting: Gastroenterology

## 2021-06-16 DIAGNOSIS — D500 Iron deficiency anemia secondary to blood loss (chronic): Secondary | ICD-10-CM

## 2021-06-19 ENCOUNTER — Telehealth: Payer: Self-pay | Admitting: Nurse Practitioner

## 2021-06-20 NOTE — Telephone Encounter (Signed)
Called and spoke with patient. She is aware of her upcoming appt with Anderson Malta on 06/28/21 at 10:30 am. Pt is aware that they may do repeat lab work at that time as well. I provided pt with the office address as well. Pt verbalized understanding and had no concerns at the end of the call.

## 2021-06-20 NOTE — Telephone Encounter (Signed)
Dr. Bryan Lemma, pt's last CBC was on 12/9. I have not been able to reach pt about her follow up appt either.

## 2021-06-20 NOTE — Telephone Encounter (Signed)
Chart reviewed.  Looks like she was discharged on 12/9.  Does have follow-up appointment scheduled with Anderson Malta next week.  If unable to reach her in the interim, can set up for CBC at that appointment.  Thank you.

## 2021-06-25 DIAGNOSIS — Z20822 Contact with and (suspected) exposure to covid-19: Secondary | ICD-10-CM | POA: Diagnosis not present

## 2021-06-28 ENCOUNTER — Ambulatory Visit: Payer: Medicare Other | Admitting: Physician Assistant

## 2021-07-09 IMAGING — MR MR ABDOMEN WO/W CM
9 of 24 series · 18 of 48 positions shown · IV contrast (5 GV)
Comparison: CT scan 02/14/2019.  CT scan 07/05/2009.

CLINICAL DATA: Pancreatic ductal dilatation on recent CT.

EXAM:
MRI ABDOMEN WITHOUT AND WITH CONTRAST
TECHNIQUE: Multiplanar multisequence MR imaging of the abdomen was performed
both before and after the administration of intravenous contrast.
CONTRAST:  5mL GADAVIST GADOBUTROL 1 MMOL/ML IV SOLN

[Series 3: cor ssfse rt · coronal · 6.0mm · 0.70mm/px · 2 of 27 slices shown]
[im 1/27]
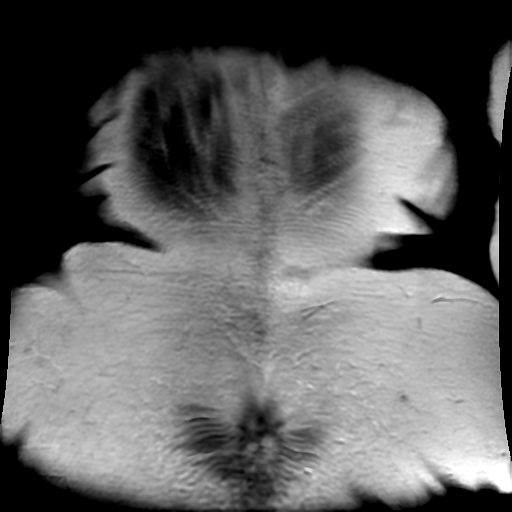
[im 27/27]
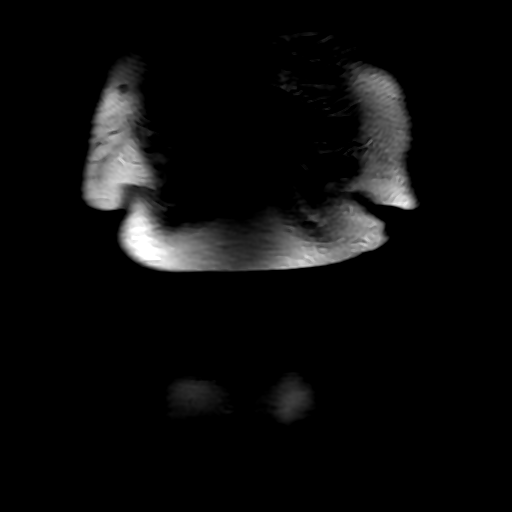

[Series 4: T2 fat-sat · axial · 6.0mm · 0.74mm/px · z∈[+43,+247]mm · 2 of 32 slices shown]
[im 1/32]
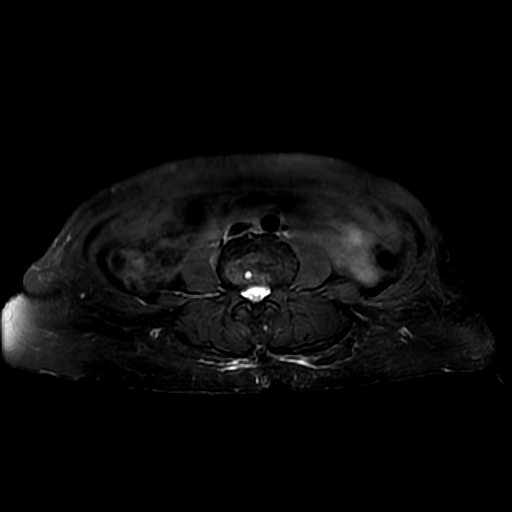
[im 32/32]
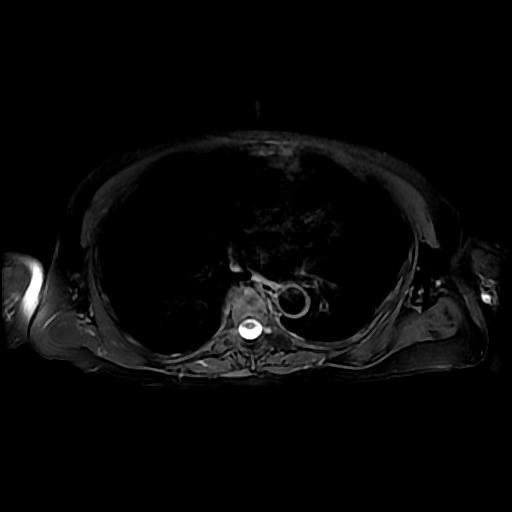

[Series 7: bSSFP · coronal · 6.0mm · 0.70mm/px · 2 of 29 slices shown]
[im 1/29]
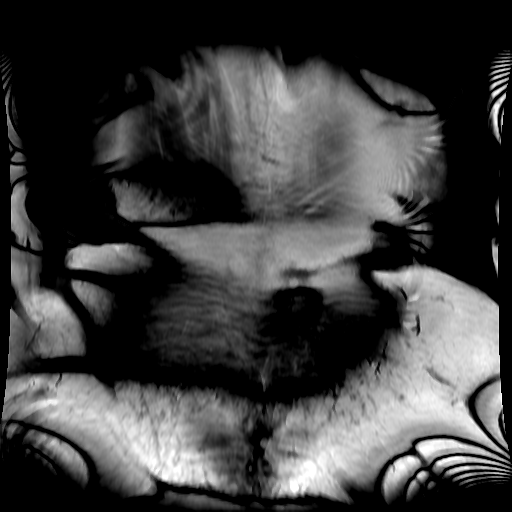
[im 29/29]
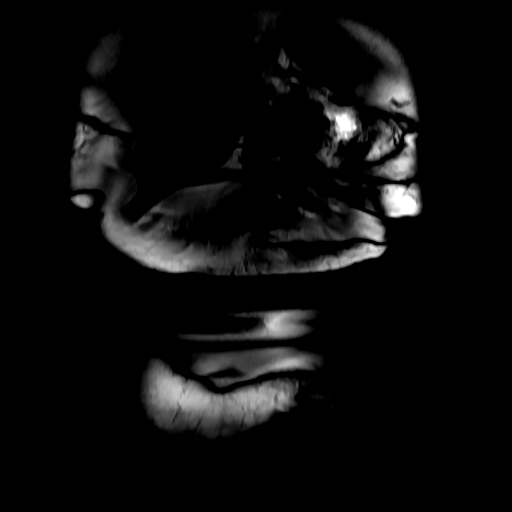

[Series 8: ax ssfse rt · axial · 6.0mm · 0.74mm/px · z∈[+43,+247]mm · 2 of 35 slices shown]
[im 1/35]
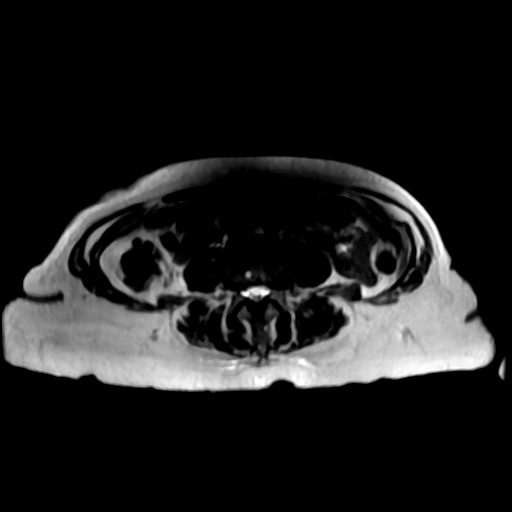
[im 35/35]
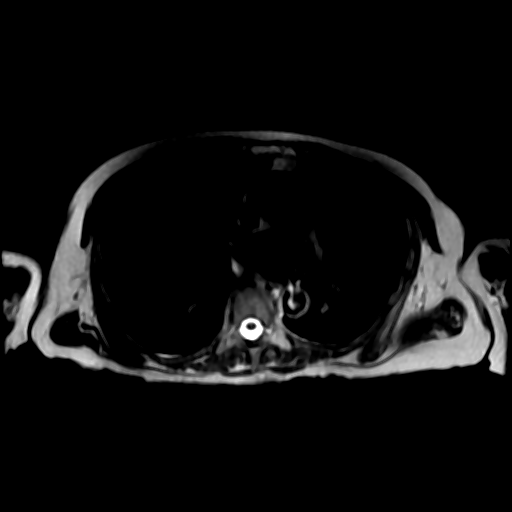

[Series 9: ax ssfse bh · axial · 6.0mm · 0.78mm/px · z∈[+12,+222]mm · 2 of 36 slices shown]
[im 1/36]
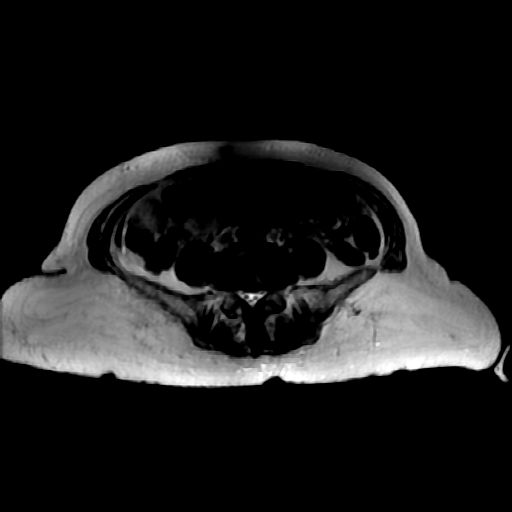
[im 36/36]
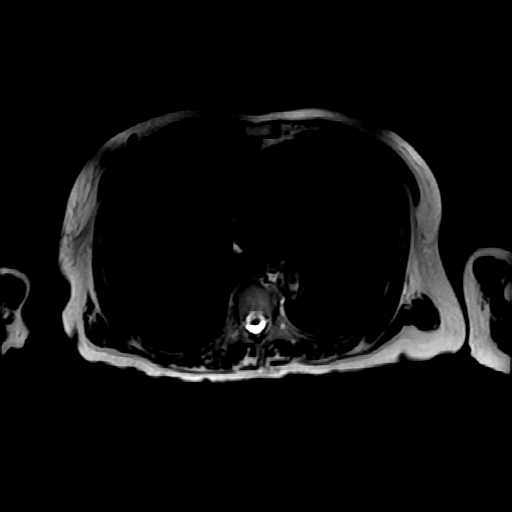

[Series 10: DWI b500 · axial · 8.0mm · 1.95mm/px · z∈[-28,+282]mm · 3 of 64 slices shown]
[im 1/64]
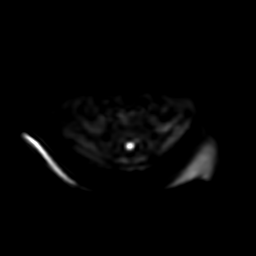
[im 32/64]
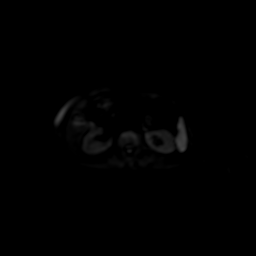
[im 64/64]
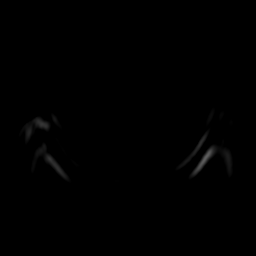

[Series 11: radial 2d thick · coronal · 50.0mm · 0.74mm/px · 1 of 10 slices shown]
[im 1/10]
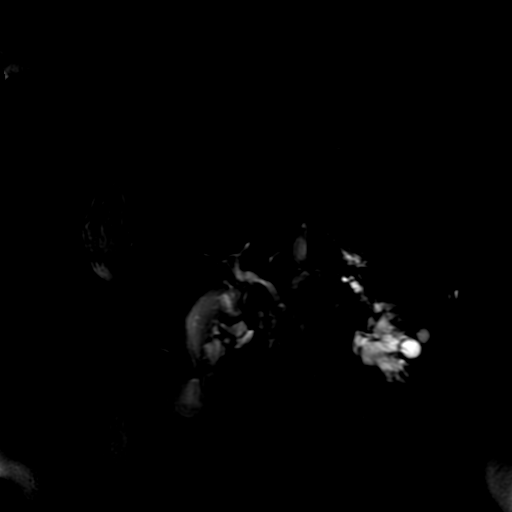

[Series 12: T1 dynamic · axial · 4.0mm · 0.78mm/px · z∈[+7,+235]mm · 2 of 58 slices shown (1 of 2)]
[im 1/58]
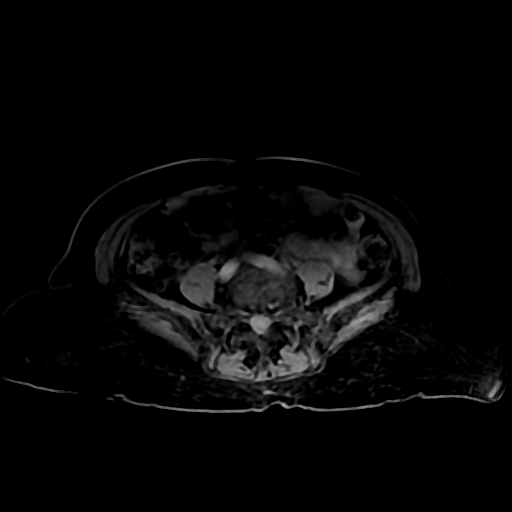
[im 58/58]
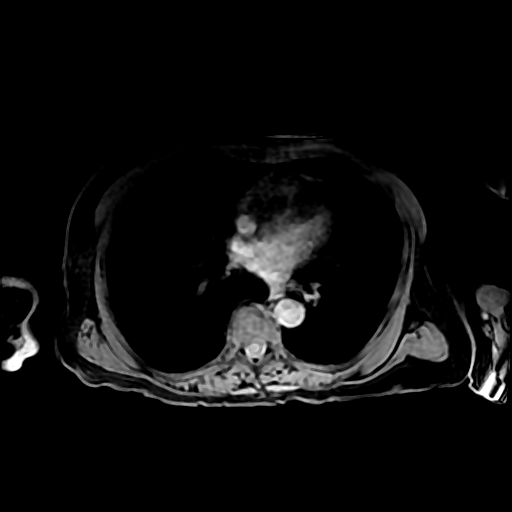

[Series 14: T1 dynamic · coronal · 4.0mm · 0.66mm/px · 2 of 52 slices shown (2 of 2)]
[im 1/52]
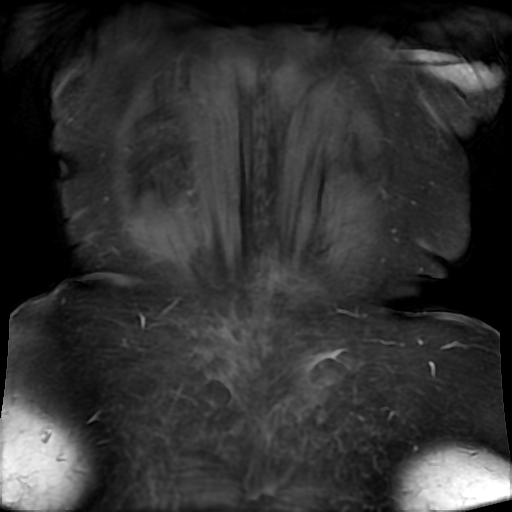
[im 52/52]
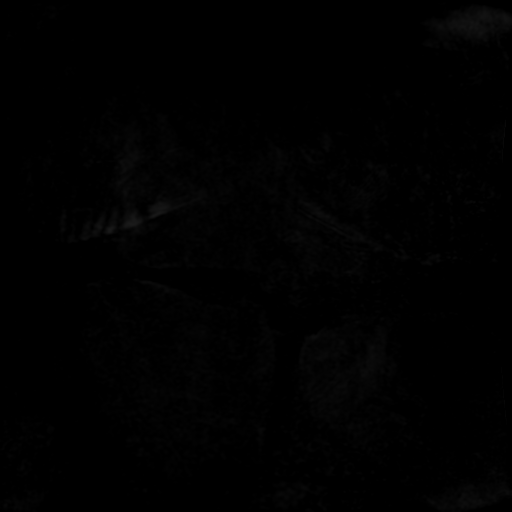

[18 of 48 positions shown; findings below may reference images not displayed]

FINDINGS: Lower chest: Unremarkable.

Hepatobiliary: Focal fatty deposition noted in the medial segment
left liver at the falciform ligament. No suspicious focal
abnormality within the liver parenchyma. There is no evidence for
gallstones, gallbladder wall thickening, or pericholecystic fluid.
Mild intra and extrahepatic biliary duct prominence without
choledocholithiasis. No obstructing mass lesion.

Pancreas: Diffuse dilatation of the main pancreatic duct similar to
prior studies dating back to 9656. Tiny cystic foci in the tail of
pancreas likely related to side branch ectasia. The pancreatic
parenchymal calcification seen on previous CT scan is not readily
evident by MRI.

Spleen:  No splenomegaly. No focal mass lesion.

Adrenals/Urinary Tract: No adrenal nodule or mass. Central sinus
cysts noted in the kidneys bilaterally.

Stomach/Bowel: Stomach is unremarkable. No gastric wall thickening.
No evidence of outlet obstruction. No small bowel or colonic
dilatation within the visualized abdomen. Colonic diverticulosis
evident.

Vascular/Lymphatic: No abdominal aortic aneurysm. There is no
gastrohepatic or hepatoduodenal ligament lymphadenopathy. No
intraperitoneal or retroperitoneal lymphadenopathy.

Other:  No intraperitoneal free fluid.

Musculoskeletal: No abnormal marrow enhancement within the
visualized bony anatomy.
IMPRESSION: 1. No substantial interval change in the diffuse irregular
dilatation of the main pancreatic duct with associated diffuse
pancreatic parenchymal atrophy. Pancreatic parenchymal calcification
seen on previous CT scans is not readily evident by MRI today.
Overall, imaging features remain compatible with sequelae of chronic
pancreatitis.

## 2021-09-03 ENCOUNTER — Other Ambulatory Visit: Payer: Self-pay | Admitting: Gastroenterology

## 2021-09-03 DIAGNOSIS — D500 Iron deficiency anemia secondary to blood loss (chronic): Secondary | ICD-10-CM

## 2021-12-11 ENCOUNTER — Other Ambulatory Visit: Payer: Self-pay | Admitting: Emergency Medicine

## 2022-09-09 LAB — HM DEXA SCAN

## 2022-12-02 ENCOUNTER — Encounter (HOSPITAL_BASED_OUTPATIENT_CLINIC_OR_DEPARTMENT_OTHER): Payer: Self-pay | Admitting: Emergency Medicine

## 2022-12-02 ENCOUNTER — Other Ambulatory Visit: Payer: Self-pay

## 2022-12-02 ENCOUNTER — Other Ambulatory Visit (HOSPITAL_BASED_OUTPATIENT_CLINIC_OR_DEPARTMENT_OTHER): Payer: Self-pay

## 2022-12-02 ENCOUNTER — Emergency Department (HOSPITAL_BASED_OUTPATIENT_CLINIC_OR_DEPARTMENT_OTHER)
Admission: EM | Admit: 2022-12-02 | Discharge: 2022-12-02 | Disposition: A | Discharge status: Home / Self Care | Payer: Medicare Other | Attending: Emergency Medicine | Admitting: Emergency Medicine

## 2022-12-02 DIAGNOSIS — T7840XA Allergy, unspecified, initial encounter: Secondary | ICD-10-CM | POA: Insufficient documentation

## 2022-12-02 DIAGNOSIS — R220 Localized swelling, mass and lump, head: Secondary | ICD-10-CM | POA: Diagnosis present

## 2022-12-02 MED ORDER — PREDNISONE 10 MG PO TABS
20.0000 mg | ORAL_TABLET | Freq: Every day | ORAL | 0 refills | Status: DC
  Filled 2022-12-02: qty 10, 5d supply, fill #0

## 2022-12-02 MED ORDER — LORATADINE 10 MG PO TABS
10.0000 mg | ORAL_TABLET | Freq: Every day | ORAL | 0 refills | Status: DC
  Filled 2022-12-02: qty 90, 90d supply, fill #0

## 2022-12-02 MED ORDER — PREDNISONE 10 MG PO TABS: 20.0000 mg | ORAL_TABLET | Freq: Every day | ORAL | 0 refills | Status: AC

## 2022-12-02 MED ORDER — LORATADINE 10 MG PO TABS: 10.0000 mg | ORAL_TABLET | Freq: Every day | ORAL | 0 refills | Status: DC

## 2022-12-02 NOTE — Discharge Instructions (Addendum)
Take the steroids and the antihistamines.  You can stop taking the antihistamine once the swelling and redness resolves.  Return to the emergency room restart having fevers or chills, pain, difficulty breathing or swallowing.  Follow-up with your primary care doctor later this week to be rechecked to make sure the symptoms have resolved

## 2022-12-02 NOTE — ED Triage Notes (Signed)
Pt arrives to ED with c/o left sided facial swelling x2 days.

## 2022-12-02 NOTE — ED Provider Notes (Signed)
[facility]Y DEPARTMENT AT [facility] Provider Note   CSN: [id number] Arrival date & time: 12/02/22  0815     History  Chief Complaint  Patient presents with   Facial Swelling    Latoya Bennett is a 87 y.o. female.  HPI   Patient presents to the ED for evaluation of facial swelling.  Patient states the symptoms started 2 days ago after she was in the yard.  She rubbed her face and started noticed some swelling around her eye and cheek.  She tried applying some creams without relief.  Patient states it is not painful or tender.  It is very itchy.  She is not having any sinus congestion.  She is not having any tooth ache.  She is not having any difficulty breathing or swallowing.  She has noticed it mostly on the left side of her cheek face and around her eye  Home Medications Prior to Admission medications   Medication Sig Start Date End Date Taking? Authorizing Provider  loratadine (CLARITIN) 10 MG tablet Take 1 tablet (10 mg total) by mouth daily. 12/02/22  Yes Linwood Dibbles, MD  predniSONE (DELTASONE) 10 MG tablet Take 2 tablets (20 mg total) by mouth daily for 5 days. 12/02/22 12/07/22 Yes Linwood Dibbles, MD  diclofenac Sodium (VOLTAREN) 1 % GEL Apply 2 g topically 4 (four) times daily. Patient taking differently: Apply 2 g topically 2 (two) times daily as needed (pain). 04/30/20   Burnadette Pop, MD  famotidine (PEPCID) 20 MG tablet Take 1 tablet (20 mg total) by mouth daily. Patient taking differently: Take 20 mg by mouth daily before breakfast. 06/05/21 06/05/22  Ghimire, Werner Lean, MD  feeding supplement, ENSURE ENLIVE, (ENSURE ENLIVE) LIQD Take 237 mLs by mouth 2 (two) times daily between meals. Patient not taking: Reported on 04/26/2020 02/17/19   [name], DO  ferrous sulfate (FEROSUL) 325 (65 FE) MG tablet Take 1 tablet (325 mg total) by mouth 2 (two) times daily with a meal. TAKE 2 TABLETS(650 MG) BY MOUTH EVERY OTHER DAY Strength: 325 (65 Fe) mg  06/14/21   Zannie Cove, MD  lactose free nutrition (BOOST) LIQD Take 237 mLs by mouth daily.    [provider]  Menthol, Topical Analgesic, (BIOFREEZE) 4 % GEL Apply 1 application topically 2 (two) times daily.    [provider]  Multiple Vitamin (MULTIVITAMIN WITH MINERALS) TABS tablet Take 1 tablet by mouth daily. 02/18/19   [name], DO  Potassium 99 MG TABS Take 99 mg by mouth every morning.    [provider]      Allergies    Patient has no known allergies.    Review of Systems   Review of Systems  Physical Exam Updated Vital Signs BP (!) 154/75 (BP Location: Right Arm)   Pulse 91   Temp 97.9 F (36.6 C) (Oral)   Resp 18   Wt 62.1 kg   SpO2 95%   BMI 34.24 kg/m  Physical Exam Vitals and nursing note reviewed.  Constitutional:      General: She is not in acute distress.    Appearance: She is well-developed.  HENT:     Head: Normocephalic.     Comments: Mild erythema and edema noted around the left cheek and left periorbital region including the eyelid, no tenderness, no tongue swelling, no oropharyngeal swelling, no difficulty handling secretions or speech    Right Ear: External ear normal.     Left Ear: External ear normal.  Eyes:     General: No scleral icterus.       Right eye: No discharge.        Left eye: No discharge.     Conjunctiva/sclera: Conjunctivae normal.  Neck:     Trachea: No tracheal deviation.  Cardiovascular:     Rate and Rhythm: Normal rate.  Pulmonary:     Effort: Pulmonary effort is normal. No respiratory distress.     Breath sounds: Normal breath sounds. No stridor.  Abdominal:     General: There is no distension.  Musculoskeletal:        General: No swelling or deformity.     Cervical back: Neck supple.  Skin:    General: Skin is warm and dry.     Findings: No rash.  Neurological:     Mental Status: She is alert. Mental status is at baseline.     Cranial Nerves: No dysarthria or facial  asymmetry.     Motor: No seizure activity.     ED Results / Procedures / Treatments   Labs (all labs ordered are listed, but only abnormal results are displayed) Labs Reviewed - No data to display  EKG None  Radiology No results found.  Procedures Procedures    Medications Ordered in ED Medications - No data to display  ED Course/ Medical Decision Making/ A&P                             Medical Decision Making Risk OTC drugs. Prescription drug management.   Exam suggestive of some type of allergic reaction.  Clear if patient was exposed to something when she was sitting out in the yard.  I doubt infection at this time as she is not having any tenderness.  She denies any pain.  She has not had any fevers or chills.  Will try her on a course of steroids and antihistamines.  Discussed follow-up with primary care doctor.  Warning signs and precautions discussed        Final Clinical Impression(s) / ED Diagnoses Final diagnoses:  Allergic reaction, initial encounter    Rx / DC Orders ED Discharge Orders          Ordered    predniSONE (DELTASONE) 10 MG tablet  Daily        12/02/22 0837    loratadine (CLARITIN) 10 MG tablet  Daily        12/02/22 0837              Linwood Dibbles, MD 12/02/22 [phone]

## 2022-12-02 NOTE — ED Notes (Signed)
Pt given discharge instructions and reviewed prescriptions. Opportunities given for questions. Pt verbalizes understanding. Mizuki Hoel R, RN 

## 2022-12-15 ENCOUNTER — Ambulatory Visit (INDEPENDENT_AMBULATORY_CARE_PROVIDER_SITE_OTHER): Payer: Medicare Other | Admitting: Internal Medicine

## 2022-12-15 ENCOUNTER — Encounter: Payer: Self-pay | Admitting: Internal Medicine

## 2022-12-15 VITALS — BP 142/82 | HR 60 | Temp 97.9°F | Ht <= 58 in | Wt 137.8 lb

## 2022-12-15 DIAGNOSIS — M159 Polyosteoarthritis, unspecified: Secondary | ICD-10-CM | POA: Diagnosis not present

## 2022-12-15 DIAGNOSIS — B351 Tinea unguium: Secondary | ICD-10-CM | POA: Diagnosis not present

## 2022-12-15 DIAGNOSIS — M810 Age-related osteoporosis without current pathological fracture: Secondary | ICD-10-CM | POA: Diagnosis not present

## 2022-12-15 DIAGNOSIS — I10 Essential (primary) hypertension: Secondary | ICD-10-CM

## 2022-12-15 DIAGNOSIS — M150 Primary generalized (osteo)arthritis: Secondary | ICD-10-CM

## 2022-12-15 DIAGNOSIS — Z7689 Persons encountering health services in other specified circumstances: Secondary | ICD-10-CM

## 2022-12-15 LAB — COMPREHENSIVE METABOLIC PANEL
ALT: 16 U/L (ref 0–35)
AST: 20 U/L (ref 0–37)
Albumin: 4.4 g/dL (ref 3.5–5.2)
Alkaline Phosphatase: 94 U/L (ref 39–117)
BUN: 16 mg/dL (ref 6–23)
CO2: 27 mEq/L (ref 19–32)
Calcium: 10.5 mg/dL (ref 8.4–10.5)
Chloride: 103 mEq/L (ref 96–112)
Creatinine, Ser: 0.81 mg/dL (ref 0.40–1.20)
GFR: 66.31 mL/min (ref >60.00)
Glucose, Bld: 106 mg/dL — ABNORMAL HIGH (ref 70–99)
Potassium: 3.7 mEq/L (ref 3.5–5.1)
Sodium: 141 mEq/L (ref 135–145)
Total Bilirubin: 0.5 mg/dL (ref 0.2–1.2)
Total Protein: 7.1 g/dL (ref 6.0–8.3)

## 2022-12-15 LAB — CBC WITH DIFFERENTIAL/PLATELET
Basophils Absolute: 0 10*3/uL (ref 0.0–0.1)
Basophils Relative: 0.8 % (ref 0.0–3.0)
Eosinophils Absolute: 0 10*3/uL (ref 0.0–0.7)
Eosinophils Relative: 0.9 % (ref 0.0–5.0)
HCT: 42.7 % (ref 36.0–46.0)
Hemoglobin: 14.2 g/dL (ref 12.0–15.0)
Lymphocytes Relative: 27.8 % (ref 12.0–46.0)
Lymphs Abs: 1.4 10*3/uL (ref 0.7–4.0)
MCHC: 33.3 g/dL (ref 30.0–36.0)
MCV: 77.7 fl — ABNORMAL LOW (ref 78.0–100.0)
Monocytes Absolute: 0.3 10*3/uL (ref 0.1–1.0)
Monocytes Relative: 5.6 % (ref 3.0–12.0)
Neutro Abs: 3.2 10*3/uL (ref 1.4–7.7)
Neutrophils Relative %: 64.9 % (ref 43.0–77.0)
Platelets: 147 10*3/uL — ABNORMAL LOW (ref 150.0–400.0)
RBC: 5.49 Mil/uL — ABNORMAL HIGH (ref 3.87–5.11)
RDW: 14.2 % (ref 11.5–15.5)
WBC: 4.9 10*3/uL (ref 4.0–10.5)

## 2022-12-15 LAB — LIPID PANEL
Cholesterol: 139 mg/dL (ref 0–200)
HDL: 64.8 mg/dL (ref >39.00)
LDL Cholesterol: 62 mg/dL (ref 0–99)
NonHDL: 74.66
Total CHOL/HDL Ratio: 2
Triglycerides: 61 mg/dL (ref 0.0–149.0)
VLDL: 12.2 mg/dL (ref 0.0–40.0)

## 2022-12-15 LAB — TSH: TSH: 1.94 u[IU]/mL (ref 0.35–5.50)

## 2022-12-15 NOTE — Assessment & Plan Note (Signed)
Continue voltaren gel, heat therapy  Referral placed to ortho and HH for PT/OT in home services  Meloxicam use sparingly

## 2022-12-15 NOTE — Assessment & Plan Note (Signed)
Ref to ortho for management with prolia or bisphosphonates  Continue OTC Vit D and calcium daily

## 2022-12-15 NOTE — Assessment & Plan Note (Signed)
Ref to podiatry for management

## 2022-12-15 NOTE — Assessment & Plan Note (Signed)
Stable  Continue amlodipine 10mg  PO daily

## 2022-12-15 NOTE — Progress Notes (Signed)
[facility] [address] [place] [address] Dept: [phone] Dept Fax: [phone]  New Patient Office Visit  Subjective:   Latoya Bennett [facility] [birth year] 12/15/2022  Chief Complaint  Patient presents with   Arthritis    HPI: Latoya Bennett presents today to establish care at [facility]. Introduced to Publishing rights manager role and practice setting.  All questions answered.   Last PCP: Latoya Jungling NP Last annual physical: 2022 Concerns: see below   ARTHRITIS:  Patient c/o diffuse arthritis to knees, shoulders, and wrists. Patient c/o pain is worse in R. Wrist and R. Shoulder. Patient states this is affecting her ADL's. Patient lives at home alone. She has trouble reaching her above her head d/t joint stiffness. She also reports trouble picking up objects in her right hand, and frequently dropping objects d/t stiffness in R. Hand.  Patient does have friends/family who help take care of her and frequently check on her and clean her house. She does have meals on wheels that comes to her house. Patient does use biofreeze, Voltaren gel, meloxicam. Has done steroid injections in the past for management. She has been seeing an ortho doctor in [place], who recommended OT/PT but she has not started yet.   OSTEOPOROSIS:  Recent DEXA scan ([facility]): 09/09/22 - osteoporosis  Patient states she was recommended by PCP at [facility] to do prolia injections. Patient states she wanted second opinion prior to starting. She does take OTC Vitamin D and Calcium supplements daily.   ONYCHOMYCOSIS:  Thickening of great toenails. She would like referral to podiatry as she is unable to manage and do foot/toenail care.   The following portions of the patient's history were reviewed and updated as appropriate: past medical history, past surgical history, family history, social history,  allergies, medications, and problem list.   Patient Active Problem List   Diagnosis Date Noted   Age-related osteoporosis without current pathological fracture 12/15/2022   Onychomycosis of great toe 12/15/2022   Diverticulitis 06/03/2021   Acute blood loss anemia    Abnormal finding on GI tract imaging    Hypokalemia 02/12/2019   GERD (gastroesophageal reflux disease) 02/12/2019   Symptomatic anemia 02/12/2019   Diverticulosis of colon with hemorrhage    Osteoarthritis of multiple joints 05/19/2018   History of gout 05/19/2018   Chronic pain of both ankles 03/13/2017   Osteoarthritis of both ankles 03/13/2017   PAC (premature atrial contraction) 11/24/2016   Essential hypertension 11/24/2016   Primary osteoarthritis of left knee 11/24/2016   Tachycardia 11/24/2016   Past Medical History:  Diagnosis Date   Acute GI bleeding 06/02/2021   Acute posthemorrhagic anemia    Arthritis    Diverticulitis    GERD (gastroesophageal reflux disease)    GI bleed    GIB (gastrointestinal bleeding) 02/12/2019   Hypertension    Lower GI bleed 04/17/2018   Past Surgical History:  Procedure Laterality Date   ABDOMINAL HYSTERECTOMY     COLONOSCOPY Left 02/15/2019   Procedure: COLONOSCOPY;  Surgeon: Shellia Cleverly, DO;  Location: [facility] ENDOSCOPY;  Service: Gastroenterology;  Laterality: Left;   COLONOSCOPY WITH PROPOFOL N/A 06/12/2021   Procedure: COLONOSCOPY WITH PROPOFOL;  Surgeon: Sherrilyn Rist, MD;  Location: [facility] ENDOSCOPY;  Service: Gastroenterology;  Laterality: N/A;   ESOPHAGOGASTRODUODENOSCOPY (EGD) WITH PROPOFOL Left 04/18/2018   Procedure: ESOPHAGOGASTRODUODENOSCOPY (EGD) WITH PROPOFOL;  Surgeon: Willis Modena, MD;  Location: [facility] ENDOSCOPY;  Service: Endoscopy;  Laterality: Left;  IR ANGIOGRAM SELECTIVE EACH ADDITIONAL VESSEL  04/27/2020   IR ANGIOGRAM SELECTIVE EACH ADDITIONAL VESSEL  04/27/2020   IR ANGIOGRAM SELECTIVE EACH ADDITIONAL VESSEL  04/27/2020   IR ANGIOGRAM  VISCERAL SELECTIVE  04/26/2020   IR ANGIOGRAM VISCERAL SELECTIVE  04/26/2020   IR ANGIOGRAM VISCERAL SELECTIVE  04/27/2020   IR US GUIDE VASC ACCESS RIGHT  04/26/2020   IR US GUIDE VASC ACCESS RIGHT  04/27/2020   Family History  Problem Relation Age of Onset   Hypertension Sister    Colon cancer Neg Hx    Esophageal cancer Neg Hx    Outpatient Medications Prior to Visit  Medication Sig Dispense Refill   diclofenac Sodium (VOLTAREN) 1 % GEL Apply 2 g topically 4 (four) times daily. (Patient taking differently: Apply 2 g topically 2 (two) times daily as needed (pain).) 150 g 2   feeding supplement, ENSURE ENLIVE, (ENSURE ENLIVE) LIQD Take 237 mLs by mouth 2 (two) times daily between meals. 237 mL 12   ferrous sulfate (FEROSUL) 325 (65 FE) MG tablet Take 1 tablet (325 mg total) by mouth 2 (two) times daily with a meal. TAKE 2 TABLETS(650 MG) BY MOUTH EVERY OTHER DAY Strength: 325 (65 Fe) mg 90 tablet 1   lactose free nutrition (BOOST) LIQD Take 237 mLs by mouth daily.     loratadine (CLARITIN) 10 MG tablet Take 1 tablet (10 mg total) by mouth daily. 90 tablet 0   Menthol, Topical Analgesic, (BIOFREEZE) 4 % GEL Apply 1 application topically 2 (two) times daily.     Multiple Vitamin (MULTIVITAMIN WITH MINERALS) TABS tablet Take 1 tablet by mouth daily. 30 tablet 0   Potassium 99 MG TABS Take 99 mg by mouth every morning.     amLODipine (NORVASC) 10 MG tablet Take 10 mg by mouth daily.     famotidine (PEPCID) 20 MG tablet Take 1 tablet (20 mg total) by mouth daily. (Patient taking differently: Take 20 mg by mouth daily before breakfast.) 30 tablet 0   meloxicam (MOBIC) 15 MG tablet Take 15 mg by mouth daily.     No facility-administered medications prior to visit.   No Known Allergies  ROS: A complete ROS was performed with pertinent positives/negatives noted in the HPI. The remainder of the ROS are negative.   Objective:   Today's Vitals   12/15/22 1332  BP: (!) 144/80  Pulse: 60   Temp: 97.9 F (36.6 C)  TempSrc: Tympanic  SpO2: 98%  Weight: 137 lb 12.8 oz (62.5 kg)  Height: 4\' 5"  (1.346 m)    GENERAL: Well-appearing, in NAD. Well nourished.  SKIN: Pink, warm and dry. Thickening of great toenails  NECK: Trachea midline. Full ROM w/o pain or tenderness. No lymphadenopathy.  RESPIRATORY: Chest wall symmetrical. Respirations even and non-labored. Breath sounds clear to auscultation bilaterally.  CARDIAC: S1, S2 present, irregular rate and rhythm. Peripheral pulses 2+ bilaterally.  MSK: generalized weakness in upper and lower extremities (+)heberdens and bouchards nodes to DIP and PIP joints of bilateral hands. Decreased grip strength to R. hand EXTREMITIES: Without clubbing, cyanosis. 2+ edema BLE NEUROLOGIC: wheelchair bound PSYCH/MENTAL STATUS: Alert, oriented x 3. Cooperative, appropriate mood and affect.   There are no preventive care reminders to display for this patient.   Results for orders placed or performed in visit on 12/15/22  HM DEXA SCAN  Result Value Ref Range   HM Dexa Scan Osteopenia/ Osteoporoisi        Assessment & Plan:  Encounter to establish care  Age-related osteoporosis without current pathological fracture Assessment & Plan: Ref to ortho for management with prolia or bisphosphonates  Continue OTC Vit D and calcium daily   Orders: -     Ambulatory referral to Home Health -     Ambulatory referral to Orthopedics  Primary osteoarthritis involving multiple joints Assessment & Plan: Continue voltaren gel, heat therapy  Referral placed to ortho and HH for PT/OT in home services  Meloxicam use sparingly   Orders: -     Ambulatory referral to Home Health -     Ambulatory referral to Orthopedics  Onychomycosis of great toe Assessment & Plan: Ref to podiatry for management   Orders: -     Ambulatory referral to Podiatry  Essential hypertension Assessment & Plan: Stable  Continue amlodipine 10mg  PO daily   Orders: -      CBC with Differential/Platelet -     Comprehensive metabolic panel -     Lipid panel -     TSH     Return in about 4 months (around 04/16/2023) for chronic condition follow up.   Salvatore Decent, FNP

## 2022-12-16 ENCOUNTER — Other Ambulatory Visit: Payer: Self-pay | Admitting: Internal Medicine

## 2022-12-31 ENCOUNTER — Ambulatory Visit (INDEPENDENT_AMBULATORY_CARE_PROVIDER_SITE_OTHER): Payer: Medicare Other | Admitting: Podiatry

## 2022-12-31 DIAGNOSIS — M79675 Pain in left toe(s): Secondary | ICD-10-CM

## 2022-12-31 DIAGNOSIS — B351 Tinea unguium: Secondary | ICD-10-CM

## 2022-12-31 DIAGNOSIS — M79674 Pain in right toe(s): Secondary | ICD-10-CM

## 2022-12-31 NOTE — Progress Notes (Signed)
   Chief Complaint  Patient presents with   Nail Problem    Patient came in today for routine foot care, nails and thick and dark,  patient also has some ankle and leg swelling,  patient would like to talk about shoes she can wear     SUBJECTIVE Patient presents to office today complaining of elongated, thickened nails that cause pain while ambulating in shoes.  Patient is unable to trim their own nails. Patient is here for further evaluation and treatment.  Past Medical History:  Diagnosis Date   Acute GI bleeding 06/02/2021   Acute posthemorrhagic anemia    Arthritis    Diverticulitis    GERD (gastroesophageal reflux disease)    GI bleed    GIB (gastrointestinal bleeding) 02/12/2019   Hypertension    Lower GI bleed 04/17/2018    No Known Allergies   OBJECTIVE General Patient is awake, alert, and oriented x 3 and in no acute distress. Derm Skin is dry and supple bilateral. Negative open lesions or macerations. Remaining integument unremarkable. Nails are tender, long, thickened and dystrophic with subungual debris, consistent with onychomycosis, 1-5 bilateral. No signs of infection noted. Vasc  DP and PT pedal pulses palpable bilaterally. Temperature gradient within normal limits.  Neuro Epicritic and protective threshold sensation grossly intact bilaterally.  Musculoskeletal Exam No symptomatic pedal deformities noted bilateral. Muscular strength within normal limits.  ASSESSMENT 1.  Pain due to onychomycosis of toenails both  PLAN OF CARE 1. Patient evaluated today.  2. Instructed to maintain good pedal hygiene and foot care.  3. Mechanical debridement of nails 1-5 bilaterally performed using a nail nipper. Filed with dremel without incident.  4. Return to clinic as needed   Felecia Shelling, DPM [facility]  Dr. Felecia Shelling, DPM    [address] [place], [place] [address]                Office [phone]  Fax  [phone]

## 2023-01-01 ENCOUNTER — Ambulatory Visit (INDEPENDENT_AMBULATORY_CARE_PROVIDER_SITE_OTHER): Payer: Medicare Other | Admitting: Physician Assistant

## 2023-01-01 ENCOUNTER — Encounter: Payer: Self-pay | Admitting: Physician Assistant

## 2023-01-01 DIAGNOSIS — M79641 Pain in right hand: Secondary | ICD-10-CM | POA: Diagnosis not present

## 2023-01-01 NOTE — Progress Notes (Signed)
HPI: Latoya Bennett is a 87 year old female with a she states that her right hand she has numbness and cannot grip with.  She notes that the pain travels up her arm and wakes her up at night.  She is right-handed.  She has had no injury.  Is been ongoing for the past 6 months.  She does note that she has arthritis in her hand but she states even with holding her phone she has been severe pain in the hand.  She states she has to shake the hand in the morning.She states that involves particularly the right thumb index and long finger.  Review of systems see HPI otherwise negative or noncontributory.  Physical exam: General patient seated in wheelchair frail-appearing female no acute distress. Bilateral hands radial pulses are 2+ and equal symmetric.  There is no rashes skin lesions ulcerations.  She has tenderness at the right [facility] joint.  Positive Tinel's and compression test over the right mid median nerve at the wrist.  Positive Phalen's on the right.  Negative Tinel's and compression test over the median nerve on the left.  Negative Phalen's on the left.  Subjective decrease sensation involving the right thumb index and long finger full sensation throughout the left hand.  Grip strength is 5 out of 5 bilaterally.  Impression: Right hand numbness  Plan: Will obtain EMG nerve conduction studies to rule out carpal tunnel syndrome right hand.  Have her follow-up after the studies to go over results and discuss further treatment.

## 2023-01-02 ENCOUNTER — Other Ambulatory Visit: Payer: Self-pay

## 2023-01-02 DIAGNOSIS — M79641 Pain in right hand: Secondary | ICD-10-CM

## 2023-01-05 ENCOUNTER — Ambulatory Visit (INDEPENDENT_AMBULATORY_CARE_PROVIDER_SITE_OTHER): Payer: Medicare Other | Admitting: Physician Assistant

## 2023-01-05 ENCOUNTER — Encounter: Payer: Self-pay | Admitting: Physician Assistant

## 2023-01-05 DIAGNOSIS — M2550 Pain in unspecified joint: Secondary | ICD-10-CM

## 2023-01-05 NOTE — Progress Notes (Signed)
Office Visit Note   Patient: Latoya Bennett           Date of Birth: Dec 07, 1935           MRN: [id number] Visit Date: 01/05/2023              Requested by: Salvatore Decent, FNP [address] [place],  [place] [address] PCP: Salvatore Decent, FNP  Chief Complaint  Patient presents with   Right Shoulder - Pain   Left Shoulder - Pain      HPI: Latoya Bennett comes in today with multiple complaints.  She says she has pain in her shoulders and her hands.  She also has pain all over her body.  She also is looking for a second opinion as she was placed on medication for osteoporosis and she does not want to do shots and wants to know if there is something different.  She is asking if MRIs can be done.  She denies any injury  Assessment & Plan: Visit Diagnoses:  1. Polyarthralgia     Plan: I spoke to the patient and her family.  I explained to her that polyarthralgia is not something that I would manage.  She does looking at her records she has had polyarthralgia going on for a while now.  In the past she has had referrals to rheumatology.  She also has a history of osteoporosis and osteoarthritis.  She saw Kriste Basque a couple weeks ago and was focused on the hand numbness.  She has not yet had EMG studies but she is sure she has carpal tunnel syndrome.  As she is looking for management of all of these issues as well as a second opinion I have recommended a referral to rheumatology or again she could take this up with her primary care physician with modalities such as physical therapy and strength training alternatives to her medication that she that she has been prescribed in chronic pain management as she is requesting something for pain today.  Follow-Up Instructions: No follow-ups on file.   Ortho Exam  Patient is alert, oriented, no adenopathy, well-dressed, normal affect, normal respiratory effort. Patient was not examined was referred as she was seeking a second opinion on  medications and had polyarthralgia.  This has been a longstanding problem.  She is looking for someone permanent to follow her for these issues.  Imaging: No results found. No images are attached to the encounter.  Labs: Lab Results  Component Value Date   LABURIC 3.3 05/07/2018   REPTSTATUS 06/27/2009 FINAL 06/21/2009   REPTSTATUS 06/23/2009 FINAL 06/21/2009   GRAMSTAIN  06/21/2009    RARE WBC PRESENT, PREDOMINANTLY PMN NO SQUAMOUS EPITHELIAL CELLS SEEN ABUNDANT GRAM NEGATIVE RODS MODERATE GRAM POSITIVE COCCI IN PAIRS   GRAMSTAIN  06/21/2009    MODERATE WBC PRESENT, PREDOMINANTLY PMN NO SQUAMOUS EPITHELIAL CELLS SEEN MODERATE GRAM POSITIVE COCCI IN PAIRS MODERATE GRAM NEGATIVE RODS FEW GRAM POSITIVE RODS   CULT  06/21/2009    PREVOTELLA MELANINOGENICA Note: BETA LACTAMASE POSITIVE   CULT  06/21/2009    MULTIPLE ORGANISMS PRESENT, NONE PREDOMINANT NO STAPHYLOCOCCUS AUREUS ISOLATED NO GROUP A STREP (S.PYOGENES) ISOLATED     Lab Results  Component Value Date   ALBUMIN 4.4 12/15/2022   ALBUMIN 3.0 (L) 06/08/2021   ALBUMIN 2.4 (L) 06/03/2021    Lab Results  Component Value Date   MG 1.8 06/10/2021   MG 1.8 06/04/2021   MG 2.0 04/27/2020   No results found for: "VD25OH"  No results found for: "PREALBUMIN"    Latest Ref Rng & Units 12/15/2022    2:37 PM 06/14/2021    2:22 AM 06/13/2021    3:14 PM  CBC EXTENDED  WBC 4.0 - 10.5 K/uL 4.9  3.5  5.0   RBC 3.87 - 5.11 Mil/uL 5.49  2.73  3.16   Hemoglobin 12.0 - 15.0 g/dL 40.9  7.7  8.7   HCT 81.1 - 46.0 % 42.7  22.9  26.4   Platelets 150.0 - 400.0 K/uL 147.0  142  159   NEUT# 1.4 - 7.7 K/uL 3.2     Lymph# 0.7 - 4.0 K/uL 1.4        There is no height or weight on file to calculate BMI.  Orders:  Orders Placed This Encounter  Procedures   Ambulatory referral to Rheumatology   No orders of the defined types were placed in this encounter.    Procedures: No procedures performed  Clinical Data: No additional  findings.  ROS:  All other systems negative, except as noted in the HPI. Review of Systems  Objective: Vital Signs: There were no vitals taken for this visit.  Specialty Comments:  No specialty comments available.  PMFS History: Patient Active Problem List   Diagnosis Date Noted   Age-related osteoporosis without current pathological fracture 12/15/2022   Onychomycosis of great toe 12/15/2022   Diverticulitis 06/03/2021   Acute blood loss anemia    Abnormal finding on GI tract imaging    Hypokalemia 02/12/2019   GERD (gastroesophageal reflux disease) 02/12/2019   Symptomatic anemia 02/12/2019   Diverticulosis of colon with hemorrhage    Osteoarthritis of multiple joints 05/19/2018   History of gout 05/19/2018   Chronic pain of both ankles 03/13/2017   Osteoarthritis of both ankles 03/13/2017   PAC (premature atrial contraction) 11/24/2016   Essential hypertension 11/24/2016   Primary osteoarthritis of left knee 11/24/2016   Tachycardia 11/24/2016   Past Medical History:  Diagnosis Date   Acute GI bleeding 06/02/2021   Acute posthemorrhagic anemia    Arthritis    Diverticulitis    GERD (gastroesophageal reflux disease)    GI bleed    GIB (gastrointestinal bleeding) 02/12/2019   Hypertension    Lower GI bleed 04/17/2018    Family History  Problem Relation Age of Onset   Hypertension Sister    Colon cancer Neg Hx    Esophageal cancer Neg Hx     Past Surgical History:  Procedure Laterality Date   ABDOMINAL HYSTERECTOMY     COLONOSCOPY Left 02/15/2019   Procedure: COLONOSCOPY;  Surgeon: Shellia Cleverly, DO;  Location: [facility] ENDOSCOPY;  Service: Gastroenterology;  Laterality: Left;   COLONOSCOPY WITH PROPOFOL N/A 06/12/2021   Procedure: COLONOSCOPY WITH PROPOFOL;  Surgeon: Sherrilyn Rist, MD;  Location: [facility] ENDOSCOPY;  Service: Gastroenterology;  Laterality: N/A;   ESOPHAGOGASTRODUODENOSCOPY (EGD) WITH PROPOFOL Left 04/18/2018   Procedure:  ESOPHAGOGASTRODUODENOSCOPY (EGD) WITH PROPOFOL;  Surgeon: Willis Modena, MD;  Location: [facility] ENDOSCOPY;  Service: Endoscopy;  Laterality: Left;   IR ANGIOGRAM SELECTIVE EACH ADDITIONAL VESSEL  04/27/2020   IR ANGIOGRAM SELECTIVE EACH ADDITIONAL VESSEL  04/27/2020   IR ANGIOGRAM SELECTIVE EACH ADDITIONAL VESSEL  04/27/2020   IR ANGIOGRAM VISCERAL SELECTIVE  04/26/2020   IR ANGIOGRAM VISCERAL SELECTIVE  04/26/2020   IR ANGIOGRAM VISCERAL SELECTIVE  04/27/2020   IR US GUIDE VASC ACCESS RIGHT  04/26/2020   IR US GUIDE VASC ACCESS RIGHT  04/27/2020   Social History   Occupational  History   Occupation: retired  Tobacco Use   Smoking status: Never   Smokeless tobacco: Never  Vaping Use   Vaping Use: Never used  Substance and Sexual Activity   Alcohol use: Not Currently    Alcohol/week: 0.0 standard drinks of alcohol   Drug use: Never   Sexual activity: Not on file

## 2023-03-12 ENCOUNTER — Telehealth: Payer: Self-pay | Admitting: Internal Medicine

## 2023-03-12 NOTE — Telephone Encounter (Signed)
Spoke with patient and is scheduled tomorrow

## 2023-03-12 NOTE — Telephone Encounter (Signed)
Pt is in a lot of pain with her hands, shoulders and knees. She is wanting an x-ray of her shoulders as to why she has so much pain. She is now wanting Home Health, she is unable to take care of herself like she use to. Please advise pt at [phone]

## 2023-03-13 ENCOUNTER — Ambulatory Visit: Payer: Medicare Other | Admitting: Family Medicine

## 2023-03-13 ENCOUNTER — Telehealth: Payer: Self-pay | Admitting: Internal Medicine

## 2023-03-13 NOTE — Telephone Encounter (Signed)
1st missed visit/same day cancel/in pain

## 2023-03-13 NOTE — Telephone Encounter (Signed)
Pt just called in stating she would not be able to make her OV with Dr Veto Kemps on 03/13/23, she is in a lot of pain. I did not send a letter.

## 2023-04-13 ENCOUNTER — Ambulatory Visit: Payer: Medicare Other | Admitting: Internal Medicine

## 2023-04-13 ENCOUNTER — Ambulatory Visit (INDEPENDENT_AMBULATORY_CARE_PROVIDER_SITE_OTHER): Payer: Medicare Other | Admitting: Nurse Practitioner

## 2023-04-13 ENCOUNTER — Encounter: Payer: Self-pay | Admitting: Nurse Practitioner

## 2023-04-13 VITALS — BP 158/86 | HR 62 | Temp 97.8°F | Ht <= 58 in | Wt 136.6 lb

## 2023-04-13 DIAGNOSIS — G8929 Other chronic pain: Secondary | ICD-10-CM | POA: Insufficient documentation

## 2023-04-13 DIAGNOSIS — G894 Chronic pain syndrome: Secondary | ICD-10-CM | POA: Diagnosis not present

## 2023-04-13 MED ORDER — GABAPENTIN 100 MG PO CAPS
100.0000 mg | ORAL_CAPSULE | Freq: Every day | ORAL | 5 refills | Status: AC
Start: 2023-04-13 — End: ?

## 2023-04-13 NOTE — Assessment & Plan Note (Signed)
Secondary to OA and pseudogout. Associated with limited ROM in shoulders and knees.  She has difficulty with ADLs in AM due to joint stiffness and pain. Current use of wheelchair and cane. She had an evaluation by ortho and rheumatology. Last appointment with rheumatology 01/2023. She did not schedule f/up as recommended She reports no improvement with tylenol, colchicine, mobic and oral prednisone. Arthritis panel completed 05/2022(ANA, ESR, CRP, Anti-CCP, RA): normal  Advised to schedule f/up appointment with rheumatology Continue mobic as prescribed, add gabapentin 100mg  at hs. Advised abotu risk of sedation with gabapentin. F/up with pcp in 80month

## 2023-04-13 NOTE — Patient Instructions (Addendum)
Schedule f/up with rheumatology and AshleyMyers, NP. Maintain meloxicam. Start tylenol 650mg  in AM and PM Start gabapentin 100mg  at bedtime. This may cause drowsiness.

## 2023-04-13 NOTE — Progress Notes (Signed)
Established Patient Visit  Patient: Latoya Bennett   DOB: 01/03/1936   87 y.o. Female  MRN: [id number] Visit Date: 04/13/2023  Subjective:    Chief Complaint  Patient presents with   Referral   Generalized Body Aches    Started 4 months ago    HPI Chronic pain of multiple joints Secondary to OA and pseudogout. Associated with limited ROM in shoulders and knees.  She has difficulty with ADLs in AM due to joint stiffness and pain. Current use of wheelchair and cane. She had an evaluation by ortho and rheumatology. Last appointment with rheumatology 01/2023. She did not schedule f/up as recommended She reports no improvement with tylenol, colchicine, mobic and oral prednisone. Arthritis panel completed 05/2022(ANA, ESR, CRP, Anti-CCP, RA): normal  Advised to schedule f/up appointment with rheumatology Continue mobic as prescribed, add gabapentin 100mg  at hs. Advised abotu risk of sedation with gabapentin. F/up with pcp in 59month Generalized body aches with joint stiffness,  Hx of OA: multiple joints Current use of meloxicam 15mg  daily. Declined scat bus services.  Reviewed medical, surgical, and social history today  Medications: Outpatient Medications Prior to Visit  Medication Sig   amLODipine (NORVASC) 10 MG tablet Take 10 mg by mouth daily.   diclofenac Sodium (VOLTAREN) 1 % GEL Apply 2 g topically 4 (four) times daily. (Patient taking differently: Apply 2 g topically 2 (two) times daily as needed (pain).)   feeding supplement, ENSURE ENLIVE, (ENSURE ENLIVE) LIQD Take 237 mLs by mouth 2 (two) times daily between meals.   ferrous sulfate (FEROSUL) 325 (65 FE) MG tablet Take 1 tablet (325 mg total) by mouth 2 (two) times daily with a meal. TAKE 2 TABLETS(650 MG) BY MOUTH EVERY OTHER DAY Strength: 325 (65 Fe) mg   lactose free nutrition (BOOST) LIQD Take 237 mLs by mouth daily.   loratadine (CLARITIN) 10 MG tablet Take 1 tablet (10 mg total) by mouth daily.    meloxicam (MOBIC) 15 MG tablet Take 15 mg by mouth daily.   Menthol, Topical Analgesic, (BIOFREEZE) 4 % GEL Apply 1 application topically 2 (two) times daily.   Multiple Vitamin (MULTIVITAMIN WITH MINERALS) TABS tablet Take 1 tablet by mouth daily.   Potassium 99 MG TABS Take 99 mg by mouth every morning.   famotidine (PEPCID) 20 MG tablet Take 1 tablet (20 mg total) by mouth daily. (Patient taking differently: Take 20 mg by mouth daily before breakfast.)   No facility-administered medications prior to visit.   Reviewed past medical and social history.   ROS per HPI above  Last CBC Lab Results  Component Value Date   WBC 4.9 12/15/2022   HGB 14.2 12/15/2022   HCT 42.7 12/15/2022   MCV 77.7 (L) 12/15/2022   MCH 28.2 06/14/2021   RDW 14.2 12/15/2022   PLT 147.0 (L) 12/15/2022   Last metabolic panel Lab Results  Component Value Date   GLUCOSE 106 (H) 12/15/2022   NA 141 12/15/2022   K 3.7 12/15/2022   CL 103 12/15/2022   CO2 27 12/15/2022   BUN 16 12/15/2022   CREATININE 0.81 12/15/2022   GFR 66.31 12/15/2022   CALCIUM 10.5 12/15/2022   PHOS 3.3 02/17/2019   PROT 7.1 12/15/2022   ALBUMIN 4.4 12/15/2022   LABGLOB 2.8 10/29/2016   AGRATIO 1.6 10/29/2016   BILITOT 0.5 12/15/2022   ALKPHOS 94 12/15/2022   AST 20 12/15/2022   ALT 16 12/15/2022  ANIONGAP 7 06/13/2021      Objective:  BP (!) 158/86   Pulse 62   Temp 97.8 F (36.6 C) (Temporal)   Ht 4\' 5"  (1.346 m)   Wt 136 lb 9.6 oz (62 kg)   SpO2 97%   BMI 34.19 kg/m      Physical Exam Vitals reviewed.  Cardiovascular:     Rate and Rhythm: Normal rate.     Pulses: Normal pulses.  Pulmonary:     Effort: Pulmonary effort is normal.  Musculoskeletal:        General: Tenderness present.     Right shoulder: Tenderness present. No swelling or effusion. Decreased range of motion.     Left shoulder: Tenderness present. No swelling or effusion. Decreased range of motion.     Right hand: Deformity and  tenderness present. No swelling. Decreased range of motion.     Left hand: Deformity and tenderness present. No swelling. Decreased range of motion.     Right lower leg: Edema present.     Left lower leg: Edema present.  Skin:    Findings: No erythema or rash.  Neurological:     Mental Status: She is alert and oriented to person, place, and time.  Psychiatric:        Mood and Affect: Mood normal.        Behavior: Behavior normal.        Thought Content: Thought content normal.     No results found for any visits on 04/13/23.    Assessment & Plan:    Problem List Items Addressed This Visit     Chronic pain of multiple joints - Primary    Secondary to OA and pseudogout. Associated with limited ROM in shoulders and knees.  She has difficulty with ADLs in AM due to joint stiffness and pain. Current use of wheelchair and cane. She had an evaluation by ortho and rheumatology. Last appointment with rheumatology 01/2023. She did not schedule f/up as recommended She reports no improvement with tylenol, colchicine, mobic and oral prednisone. Arthritis panel completed 05/2022(ANA, ESR, CRP, Anti-CCP, RA): normal  Advised to schedule f/up appointment with rheumatology Continue mobic as prescribed, add gabapentin 100mg  at hs. Advised abotu risk of sedation with gabapentin. F/up with pcp in 16month      Relevant Medications   gabapentin (NEURONTIN) 100 MG capsule   Return in about 4 weeks (around 05/11/2023) for chronic pain with pcp.     Alysia Penna, NP

## 2023-05-08 ENCOUNTER — Other Ambulatory Visit: Payer: Self-pay

## 2023-05-08 ENCOUNTER — Observation Stay (HOSPITAL_COMMUNITY)
Admission: EM | Admit: 2023-05-08 | Discharge: 2023-05-10 | Disposition: A | Discharge status: Home / Self Care | Payer: Medicare Other | Attending: Internal Medicine | Admitting: Internal Medicine

## 2023-05-08 ENCOUNTER — Encounter (HOSPITAL_COMMUNITY): Payer: Self-pay | Admitting: Internal Medicine

## 2023-05-08 DIAGNOSIS — E876 Hypokalemia: Secondary | ICD-10-CM | POA: Diagnosis present

## 2023-05-08 DIAGNOSIS — K922 Gastrointestinal hemorrhage, unspecified: Principal | ICD-10-CM | POA: Diagnosis present

## 2023-05-08 DIAGNOSIS — Z8719 Personal history of other diseases of the digestive system: Principal | ICD-10-CM

## 2023-05-08 DIAGNOSIS — E441 Mild protein-calorie malnutrition: Secondary | ICD-10-CM | POA: Diagnosis not present

## 2023-05-08 DIAGNOSIS — K5731 Diverticulosis of large intestine without perforation or abscess with bleeding: Secondary | ICD-10-CM | POA: Diagnosis not present

## 2023-05-08 DIAGNOSIS — R2681 Unsteadiness on feet: Secondary | ICD-10-CM | POA: Insufficient documentation

## 2023-05-08 DIAGNOSIS — I10 Essential (primary) hypertension: Secondary | ICD-10-CM | POA: Diagnosis present

## 2023-05-08 DIAGNOSIS — Z6830 Body mass index (BMI) 30.0-30.9, adult: Secondary | ICD-10-CM | POA: Diagnosis not present

## 2023-05-08 DIAGNOSIS — Z79899 Other long term (current) drug therapy: Secondary | ICD-10-CM | POA: Insufficient documentation

## 2023-05-08 DIAGNOSIS — D530 Protein deficiency anemia: Secondary | ICD-10-CM | POA: Diagnosis not present

## 2023-05-08 DIAGNOSIS — K219 Gastro-esophageal reflux disease without esophagitis: Secondary | ICD-10-CM | POA: Diagnosis not present

## 2023-05-08 DIAGNOSIS — Z8739 Personal history of other diseases of the musculoskeletal system and connective tissue: Secondary | ICD-10-CM

## 2023-05-08 DIAGNOSIS — K921 Melena: Secondary | ICD-10-CM

## 2023-05-08 DIAGNOSIS — E66811 Obesity, class 1: Secondary | ICD-10-CM | POA: Diagnosis not present

## 2023-05-08 DIAGNOSIS — D509 Iron deficiency anemia, unspecified: Secondary | ICD-10-CM | POA: Diagnosis present

## 2023-05-08 DIAGNOSIS — D62 Acute posthemorrhagic anemia: Secondary | ICD-10-CM | POA: Diagnosis present

## 2023-05-08 LAB — COMPREHENSIVE METABOLIC PANEL
ALT: 15 U/L (ref 0–44)
AST: 17 U/L (ref 15–41)
Albumin: 3.3 g/dL — ABNORMAL LOW (ref 3.5–5.0)
Alkaline Phosphatase: 69 U/L (ref 38–126)
Anion gap: 5 (ref 5–15)
BUN: 18 mg/dL (ref 8–23)
CO2: 26 mmol/L (ref 22–32)
Calcium: 9.4 mg/dL (ref 8.9–10.3)
Chloride: 106 mmol/L (ref 98–111)
Creatinine, Ser: 0.75 mg/dL (ref 0.44–1.00)
GFR, Estimated: >60 mL/min (ref >60)
Glucose, Bld: 121 mg/dL — ABNORMAL HIGH (ref 70–99)
Potassium: 3.4 mmol/L — ABNORMAL LOW (ref 3.5–5.1)
Sodium: 137 mmol/L (ref 135–145)
Total Bilirubin: 0.6 mg/dL (ref 0.3–1.2)
Total Protein: 6 g/dL — ABNORMAL LOW (ref 6.5–8.1)

## 2023-05-08 LAB — URINALYSIS, ROUTINE W REFLEX MICROSCOPIC
Bilirubin Urine: NEGATIVE
Glucose, UA: NEGATIVE mg/dL
Ketones, ur: NEGATIVE mg/dL
Nitrite: NEGATIVE
Protein, ur: NEGATIVE mg/dL
Specific Gravity, Urine: 1.009 (ref 1.005–1.030)
pH: 8 (ref 5.0–8.0)

## 2023-05-08 LAB — HEMOGLOBIN AND HEMATOCRIT, BLOOD
HCT: 34.3 % — ABNORMAL LOW (ref 36.0–46.0)
HCT: 29.5 % — ABNORMAL LOW (ref 36.0–46.0)
HCT: 29 % — ABNORMAL LOW (ref 36.0–46.0)
Hemoglobin: 11.6 g/dL — ABNORMAL LOW (ref 12.0–15.0)
Hemoglobin: 10 g/dL — ABNORMAL LOW (ref 12.0–15.0)
Hemoglobin: 9.8 g/dL — ABNORMAL LOW (ref 12.0–15.0)

## 2023-05-08 LAB — MAGNESIUM: Magnesium: 2.2 mg/dL (ref 1.7–2.4)

## 2023-05-08 LAB — LIPASE, BLOOD: Lipase: 19 U/L (ref 11–51)

## 2023-05-08 LAB — CBC
HCT: 31.9 % — ABNORMAL LOW (ref 36.0–46.0)
Hemoglobin: 10.8 g/dL — ABNORMAL LOW (ref 12.0–15.0)
MCH: 26.1 pg (ref 26.0–34.0)
MCHC: 33.9 g/dL (ref 30.0–36.0)
MCV: 77.1 fL — ABNORMAL LOW (ref 80.0–100.0)
Platelets: 152 10*3/uL (ref 150–400)
RBC: 4.14 MIL/uL (ref 3.87–5.11)
RDW: 14.8 % (ref 11.5–15.5)
WBC: 5.2 10*3/uL (ref 4.0–10.5)
nRBC: 0 % (ref 0.0–0.2)

## 2023-05-08 LAB — PHOSPHORUS: Phosphorus: 2.5 mg/dL (ref 2.5–4.6)

## 2023-05-08 LAB — TYPE AND SCREEN
ABO/RH(D): O POS
Antibody Screen: NEGATIVE

## 2023-05-08 LAB — MRSA NEXT GEN BY PCR, NASAL: MRSA by PCR Next Gen: NOT DETECTED

## 2023-05-08 MED ORDER — ORAL CARE MOUTH RINSE: 15.0000 mL | OROMUCOSAL | Status: DC | PRN

## 2023-05-08 MED ORDER — CHLORHEXIDINE GLUCONATE CLOTH 2 % EX PADS
6.0000 | MEDICATED_PAD | Freq: Every day | CUTANEOUS | Status: DC
  Administered 2023-05-08 – 2023-05-09 (×2): 6 via TOPICAL

## 2023-05-08 MED ORDER — ONDANSETRON HCL 4 MG/2ML IJ SOLN: 4.0000 mg | Freq: Four times a day (QID) | INTRAMUSCULAR | Status: DC | PRN

## 2023-05-08 MED ORDER — POTASSIUM CHLORIDE IN NACL 40-0.9 MEQ/L-% IV SOLN
INTRAVENOUS | Status: AC
Start: 2023-05-08 — End: 2023-05-08
  Filled 2023-05-08: qty 1000

## 2023-05-08 MED ORDER — ONDANSETRON HCL 4 MG PO TABS: 4.0000 mg | ORAL_TABLET | Freq: Four times a day (QID) | ORAL | Status: DC | PRN

## 2023-05-08 MED ORDER — GABAPENTIN 100 MG PO CAPS
100.0000 mg | ORAL_CAPSULE | Freq: Every day | ORAL | Status: DC
  Administered 2023-05-08 – 2023-05-09 (×2): 100 mg via ORAL
  Filled 2023-05-08 (×2): qty 1

## 2023-05-08 MED ORDER — ACETAMINOPHEN 650 MG RE SUPP: 650.0000 mg | Freq: Four times a day (QID) | RECTAL | Status: DC | PRN

## 2023-05-08 MED ORDER — ACETAMINOPHEN 325 MG PO TABS
650.0000 mg | ORAL_TABLET | Freq: Four times a day (QID) | ORAL | Status: DC | PRN
  Administered 2023-05-08: 650 mg via ORAL
  Filled 2023-05-08: qty 2

## 2023-05-08 NOTE — H&P (Signed)
History and Physical    Patient: Latoya Bennett WUJ:[id number] DOB: [birth year] DOA: 05/08/2023 DOS: the patient was seen and examined on 05/08/2023 PCP: Salvatore Decent, FNP  Patient coming from: Home  Chief Complaint:  Chief Complaint  Patient presents with   Blood In Stools    l   HPI: Latoya Bennett is a 87 y.o. female with medical history significant of hypertension, PACs, history of tachycardia, class I obesity, chronic pain of multiple joints, gout, GERD, diverticulosis, diverticulitis, history of lower GI bleed and ABLA who presented to the emergency department complaints of having 2 large dark red blood bowel movements associated with mild lightheadedness with her hemoglobin decreasing from her baseline of 13- 14 to 10.8 g/dL.  She had a small bloody bowel movement yesterday evening, then woke up earlier this morning with the bed sheets showing she denied fever, chills, rhinorrhea, sore throat, wheezing or hemoptysis.  No chest pain, palpitations, diaphoresis, PND, orthopnea or pitting edema of the lower extremities.  No abdominal pain, nausea, emesis.  No flank pain, dysuria, frequency or hematuria.  No polyuria, polydipsia, polyphagia or blurred vision.   ED course: Initial vital signs were temperature 98 F, pulse 89, respirations 28, BP 123/86 mmHg O2 sat 99% on room air.  Lab work: CBC showed a white count of 5.2, hemoglobin 10.8 g/dL with an MCV of 95.6 fL and platelets 152.  Lipase was normal.  CMP showed potassium 3.4 mmol/L, glucose 121 mg/dL, total protein 6.0 and albumin 3.3 g/dL.  Rest of the electrolytes, renal function and the rest of the LFTs were normal.   Review of Systems: As mentioned in the history of present illness. All other systems reviewed and are negative. Past Medical History:  Diagnosis Date   Acute GI bleeding 06/02/2021   Acute posthemorrhagic anemia    Arthritis    Diverticulitis    GERD (gastroesophageal reflux disease)    GI bleed    GIB  (gastrointestinal bleeding) 02/12/2019   Hypertension    Lower GI bleed 04/17/2018   Past Surgical History:  Procedure Laterality Date   ABDOMINAL HYSTERECTOMY     COLONOSCOPY Left 02/15/2019   Procedure: COLONOSCOPY;  Surgeon: Shellia Cleverly, DO;  Location: [facility] ENDOSCOPY;  Service: Gastroenterology;  Laterality: Left;   COLONOSCOPY WITH PROPOFOL N/A 06/12/2021   Procedure: COLONOSCOPY WITH PROPOFOL;  Surgeon: Sherrilyn Rist, MD;  Location: [facility] ENDOSCOPY;  Service: Gastroenterology;  Laterality: N/A;   ESOPHAGOGASTRODUODENOSCOPY (EGD) WITH PROPOFOL Left 04/18/2018   Procedure: ESOPHAGOGASTRODUODENOSCOPY (EGD) WITH PROPOFOL;  Surgeon: Willis Modena, MD;  Location: [facility] ENDOSCOPY;  Service: Endoscopy;  Laterality: Left;   IR ANGIOGRAM SELECTIVE EACH ADDITIONAL VESSEL  04/27/2020   IR ANGIOGRAM SELECTIVE EACH ADDITIONAL VESSEL  04/27/2020   IR ANGIOGRAM SELECTIVE EACH ADDITIONAL VESSEL  04/27/2020   IR ANGIOGRAM VISCERAL SELECTIVE  04/26/2020   IR ANGIOGRAM VISCERAL SELECTIVE  04/26/2020   IR ANGIOGRAM VISCERAL SELECTIVE  04/27/2020   IR US GUIDE VASC ACCESS RIGHT  04/26/2020   IR US GUIDE VASC ACCESS RIGHT  04/27/2020   Social History:  reports that she has never smoked. She has never used smokeless tobacco. She reports that she does not currently use alcohol. She reports that she does not use drugs.  No Known Allergies  Family History  Problem Relation Age of Onset   Hypertension Sister    Colon cancer Neg Hx    Esophageal cancer Neg Hx     Prior to Admission medications   Medication Sig Start Date  End Date Taking? Authorizing Provider  amLODipine (NORVASC) 10 MG tablet Take 10 mg by mouth daily.    [provider]  diclofenac Sodium (VOLTAREN) 1 % GEL Apply 2 g topically 4 (four) times daily. Patient taking differently: Apply 2 g topically 2 (two) times daily as needed (pain). 04/30/20   Burnadette Pop, MD  famotidine (PEPCID) 20 MG tablet Take 1 tablet (20 mg  total) by mouth daily. Patient taking differently: Take 20 mg by mouth daily before breakfast. 06/05/21 06/05/22  Ghimire, Werner Lean, MD  feeding supplement, ENSURE ENLIVE, (ENSURE ENLIVE) LIQD Take 237 mLs by mouth 2 (two) times daily between meals. 02/17/19   [name], DO  ferrous sulfate (FEROSUL) 325 (65 FE) MG tablet Take 1 tablet (325 mg total) by mouth 2 (two) times daily with a meal. TAKE 2 TABLETS(650 MG) BY MOUTH EVERY OTHER DAY Strength: 325 (65 Fe) mg 06/14/21   Zannie Cove, MD  gabapentin (NEURONTIN) 100 MG capsule Take 1 capsule (100 mg total) by mouth at bedtime. 04/13/23   Nche, Bonna Gains, NP  lactose free nutrition (BOOST) LIQD Take 237 mLs by mouth daily.    [provider]  loratadine (CLARITIN) 10 MG tablet Take 1 tablet (10 mg total) by mouth daily. 12/02/22   Linwood Dibbles, MD  meloxicam (MOBIC) 15 MG tablet Take 15 mg by mouth daily.    [provider]  Menthol, Topical Analgesic, (BIOFREEZE) 4 % GEL Apply 1 application topically 2 (two) times daily.    [provider]  Multiple Vitamin (MULTIVITAMIN WITH MINERALS) TABS tablet Take 1 tablet by mouth daily. 02/18/19   [name], DO  Potassium 99 MG TABS Take 99 mg by mouth every morning.    [provider]    Physical Exam: Vitals:   05/08/23 0539 05/08/23 0542 05/08/23 0543 05/08/23 0543  BP: 123/86   123/86  Pulse:  89  89  Temp:    98 F (36.7 C)  TempSrc:    Oral  SpO2:  99%  99%  Weight:   62 kg   Height:   4\' 8"  (1.422 m)    Physical Exam Vitals and nursing note reviewed.  Constitutional:      General: She is awake. She is not in acute distress.    Appearance: She is obese. She is ill-appearing.  HENT:     Head: Normocephalic.     Nose: No rhinorrhea.     Mouth/Throat:     Mouth: Mucous membranes are moist.  Eyes:     General: No scleral icterus.    Pupils: Pupils are equal, round, and reactive to light.  Neck:     Vascular: No JVD.   Cardiovascular:     Rate and Rhythm: Normal rate and regular rhythm.     Heart sounds: S1 normal and S2 normal.  Pulmonary:     Effort: Pulmonary effort is normal.     Breath sounds: Normal breath sounds.  Abdominal:     General: Bowel sounds are normal. There is no distension.     Palpations: Abdomen is soft.  Musculoskeletal:     Cervical back: Neck supple.     Right lower leg: No edema.     Left lower leg: No edema.  Skin:    General: Skin is warm and dry.  Neurological:     General: No focal deficit present.     Mental Status: She is alert and oriented to person, place, and time.  Psychiatric:        Mood and Affect: Mood normal.        Behavior: Behavior normal. Behavior is cooperative.     Data Reviewed:  Results are pending, will review when available.  Assessment and Plan: Principal Problem:   Acute blood loss anemia Superimposed on:   Microcytic anemia In the setting of:   Lower GI bleed Secondary to:   Diverticulosis of colon with hemorrhage Admit to stepdown/inpatient. Clear liquid diet. Keep NPO after midnight. Continue IV fluids. Monitor H&H. Transfuse as needed. GI consult greatly appreciated. -CTA GI bleed if he recurs.  Active Problems:   Essential hypertension Hold antihypertensives today.    History of gout Asymptomatic at this time.    Hypokalemia Replaced.    GERD (gastroesophageal reflux disease) Antiacid, H2 blocker or PPI as needed.    Mild protein malnutrition (HCC) In the setting of acute blood loss. Follow level as an outpatient with primary.    Class 1 obesity Current BMI 30.59 kg/m. Lifestyle modifications. Follow-up with closely with PCP.     Advance Care Planning:   Code Status: Full Code   Consults: [facility] Stan Head, MD).  Family Communication:   Severity of Illness: The appropriate patient status for this patient is OBSERVATION. Observation status is judged to be reasonable and necessary in order  to provide the required intensity of service to ensure the patient's safety. The patient's presenting symptoms, physical exam findings, and initial radiographic and laboratory data in the context of their medical condition is felt to place them at decreased risk for further clinical deterioration. Furthermore, it is anticipated that the patient will be medically stable for discharge from the hospital within 2 midnights of admission.   Author: Bobette Mo, MD 05/08/2023 8:41 AM  For on call review [email or web address].   This document was prepared using Dragon voice recognition software and may contain some unintended transcription errors.

## 2023-05-08 NOTE — ED Notes (Signed)
ED TO INPATIENT HANDOFF REPORT  ED Nurse Name and Phone #:Keeley Sussman Andrey Campanile, paramedic  S Name/Age/Gender [facility] 87 y.o. female Room/Bed: WA22/WA22  Code Status   Code Status: Full Code  Home/SNF/Other Home Patient oriented to: self, place, time, and situation Is this baseline? Yes   Triage Complete: Triage complete  Chief Complaint Lower GI bleed [K92.2]  Triage Note Pt arrived POV d/t having dark red blood in stool when she wipes.  Pt states she has hx diverticulitis.  No pain onset yesterday.   Allergies No Known Allergies  Level of Care/Admitting Diagnosis ED Disposition     ED Disposition  Admit   Condition  --   Comment  Hospital [facility] [[id number]]  Level of Care: Stepdown [14]  Admit to SDU based on following criteria: Hemodynamic compromise or significant risk of instability:  Patient requiring short term acute titration and management of vasoactive drips, and invasive monitoring (i.e., CVP and Arterial line).  May place patient in observation at [facility] or [facility] if equivalent level of care is available:: No  Covid Evaluation: Asymptomatic - no recent exposure (last 10 days) testing not required  Diagnosis: Lower GI bleed [161096]  Admitting Physician: Bobette Mo [[id number]]  Attending Physician: Bobette Mo [[id number]]          B Medical/Surgery History Past Medical History:  Diagnosis Date   Acute GI bleeding 06/02/2021   Acute posthemorrhagic anemia    Arthritis    Diverticulitis    GERD (gastroesophageal reflux disease)    GI bleed    GIB (gastrointestinal bleeding) 02/12/2019   Hypertension    Lower GI bleed 04/17/2018   Past Surgical History:  Procedure Laterality Date   ABDOMINAL HYSTERECTOMY     COLONOSCOPY Left 02/15/2019   Procedure: COLONOSCOPY;  Surgeon: Shellia Cleverly, DO;  Location: [facility] ENDOSCOPY;  Service: Gastroenterology;  Laterality: Left;   COLONOSCOPY WITH  PROPOFOL N/A 06/12/2021   Procedure: COLONOSCOPY WITH PROPOFOL;  Surgeon: Sherrilyn Rist, MD;  Location: [facility] ENDOSCOPY;  Service: Gastroenterology;  Laterality: N/A;   ESOPHAGOGASTRODUODENOSCOPY (EGD) WITH PROPOFOL Left 04/18/2018   Procedure: ESOPHAGOGASTRODUODENOSCOPY (EGD) WITH PROPOFOL;  Surgeon: Willis Modena, MD;  Location: [facility] ENDOSCOPY;  Service: Endoscopy;  Laterality: Left;   IR ANGIOGRAM SELECTIVE EACH ADDITIONAL VESSEL  04/27/2020   IR ANGIOGRAM SELECTIVE EACH ADDITIONAL VESSEL  04/27/2020   IR ANGIOGRAM SELECTIVE EACH ADDITIONAL VESSEL  04/27/2020   IR ANGIOGRAM VISCERAL SELECTIVE  04/26/2020   IR ANGIOGRAM VISCERAL SELECTIVE  04/26/2020   IR ANGIOGRAM VISCERAL SELECTIVE  04/27/2020   IR US GUIDE VASC ACCESS RIGHT  04/26/2020   IR US GUIDE VASC ACCESS RIGHT  04/27/2020     A IV Location/Drains/Wounds Patient Lines/Drains/Airways Status     Active Line/Drains/Airways     Name Placement date Placement time Site Days   Peripheral IV 05/08/23 20 G Anterior;Left;Proximal Forearm 05/08/23  1021  Forearm  less than 1            Intake/Output Last 24 hours No intake or output data in the 24 hours ending 05/08/23 1330  Labs/Imaging Results for orders placed or performed during the hospital encounter of 05/08/23 (from the past 48 hour(s))  Lipase, blood     Status: None   Collection Time: 05/08/23  6:15 AM  Result Value Ref Range   Lipase 19 11 - 51 U/L    Comment: Performed at [facility], [address], [place], [place] [address]  Comprehensive metabolic panel     Status: Abnormal   Collection Time: 05/08/23  6:15 AM  Result Value Ref Range   Sodium 137 135 - 145 mmol/L   Potassium 3.4 (L) 3.5 - 5.1 mmol/L   Chloride 106 98 - 111 mmol/L   CO2 26 22 - 32 mmol/L   Glucose, Bld 121 (H) 70 - 99 mg/dL    Comment: Glucose reference range applies only to samples taken after fasting for at least 8 hours.   BUN 18 8 - 23 mg/dL   Creatinine, Ser  7.82 0.44 - 1.00 mg/dL   Calcium 9.4 8.9 - 95.6 mg/dL   Total Protein 6.0 (L) 6.5 - 8.1 g/dL   Albumin 3.3 (L) 3.5 - 5.0 g/dL   AST 17 15 - 41 U/L   ALT 15 0 - 44 U/L   Alkaline Phosphatase 69 38 - 126 U/L   Total Bilirubin 0.6 0.3 - 1.2 mg/dL   GFR, Estimated >21 >30 mL/min    Comment: (NOTE) Calculated using the CKD-EPI Creatinine Equation (2021)    Anion gap 5 5 - 15    Comment: Performed at [facility], [address], [place], [place] [address]  CBC     Status: Abnormal   Collection Time: 05/08/23  6:15 AM  Result Value Ref Range   WBC 5.2 4.0 - 10.5 K/uL   RBC 4.14 3.87 - 5.11 MIL/uL   Hemoglobin 10.8 (L) 12.0 - 15.0 g/dL   HCT 46.9 (L) 62.9 - 52.8 %   MCV 77.1 (L) 80.0 - 100.0 fL   MCH 26.1 26.0 - 34.0 pg   MCHC 33.9 30.0 - 36.0 g/dL   RDW 41.3 24.4 - 01.0 %   Platelets 152 150 - 400 K/uL   nRBC 0.0 0.0 - 0.2 %    Comment: Performed at [facility], [address] [place], [place] [address]  Magnesium     Status: None   Collection Time: 05/08/23  6:15 AM  Result Value Ref Range   Magnesium 2.2 1.7 - 2.4 mg/dL    Comment: Performed at [facility], [address], [place], [place] [address]  Phosphorus     Status: None   Collection Time: 05/08/23  6:15 AM  Result Value Ref Range   Phosphorus 2.5 2.5 - 4.6 mg/dL    Comment: Performed at [facility], [address], [place], [place] [address]  Hemoglobin and hematocrit, blood     Status: Abnormal   Collection Time: 05/08/23 10:07 AM  Result Value Ref Range   Hemoglobin 11.6 (L) 12.0 - 15.0 g/dL   HCT 59.5 (L) 63.8 - 75.6 %    Comment: Performed at [facility], [address], [place], [place] [address]  Type and screen [facility]     Status: None   Collection Time: 05/08/23 10:10 AM  Result Value Ref Range   ABO/RH(D) O POS    Antibody Screen NEG    Sample Expiration       05/11/2023,2359 Performed at [facility], [address] [place], [place] [address]   Urinalysis, Routine w reflex microscopic -Urine, Clean Catch     Status: Abnormal   Collection Time: 05/08/23 11:09 AM  Result Value Ref Range   Color, Urine YELLOW YELLOW   APPearance CLEAR CLEAR   Specific Gravity, Urine 1.009 1.005 - 1.030   pH 8.0 5.0 - 8.0   Glucose, UA  NEGATIVE NEGATIVE mg/dL   Hgb urine dipstick LARGE (A) NEGATIVE   Bilirubin Urine NEGATIVE NEGATIVE   Ketones, ur NEGATIVE NEGATIVE mg/dL   Protein, ur NEGATIVE NEGATIVE mg/dL   Nitrite NEGATIVE NEGATIVE   Leukocytes,Ua TRACE (A) NEGATIVE   RBC / HPF 0-5 0 - 5 RBC/hpf   WBC, UA 0-5 0 - 5 WBC/hpf   Bacteria, UA RARE (A) NONE SEEN   Squamous Epithelial / HPF 0-5 0 - 5 /HPF   Mucus PRESENT     Comment: Performed at [facility], [address], [place], [place] [address]   No results found.  Pending Labs Unresulted Labs (From admission, onward)     Start     Ordered   05/09/23 0500  CBC  Tomorrow morning,   R        05/08/23 0916   05/09/23 0500  Comprehensive metabolic panel  Tomorrow morning,   R        05/08/23 0916   05/08/23 1030  Hemoglobin and hematocrit, blood  Every 6 hours,   R (with TIMED occurrences)      05/08/23 0916            Vitals/Pain Today's Vitals   05/08/23 1123 05/08/23 1132 05/08/23 1200 05/08/23 1300  BP: 136/66 (!) 130/57 136/76 (!) 129/59  Pulse: 89 77 73 69  Resp: 17 20 (!) 21 15  Temp:  97.9 F (36.6 C)    TempSrc:  Oral    SpO2: 99% 100% 96% 96%  Weight:      Height:      PainSc:        Isolation Precautions No active isolations  Medications Medications  acetaminophen (TYLENOL) tablet 650 mg (has no administration in time range)    Or  acetaminophen (TYLENOL) suppository 650 mg (has no administration in time range)  ondansetron (ZOFRAN) tablet 4 mg (has no administration in time range)    Or  ondansetron (ZOFRAN) injection 4 mg  (has no administration in time range)  0.9 % NaCl with KCl 40 mEq / L  infusion ( Intravenous New Bag/Given 05/08/23 1032)  gabapentin (NEURONTIN) capsule 100 mg (has no administration in time range)    Mobility walks     Focused Assessments     R Recommendations: See Admitting Provider Note  Report given to:   Additional Notes:

## 2023-05-08 NOTE — Consult Note (Addendum)
Referring Provider: Dr. Derwood Kaplan Primary Care Physician:  Salvatore Decent, FNP. [facility] Primary Gastroenterologist:  Dr. Barron Alvine  Reason for Consultation:  Painless hematochezia   HPI: Latoya Bennett is a 87 y.o. female with a past medical history arthritis, hypertension, pancreatic duct dilatation with tiny cystic foci in the tail of the pancreas per MRI, GERD, colon polyps, diverticulosis and recurrent diverticular bleeds. She presented to the ED this morning after passing large amounts of dark red blood with her bowel movements.  Labs in the ED showed a hemoglobin level of 10.8 down from 14.2 on 12/15/2022.  Hematocrit 31.9.  MCV 77.1.  Platelet 152.  Potassium 3.4.  BUN 18.  Creatinine 0.75.  Albumin 3.3.  Total bili 0.6.  Alk phos 69.  AST 17.  ALT 15.  Lipase 19.  CTA has not been done. A GI consult was requested for further evaluation regarding suspected diverticular bleed.   She went to the the bathroom 6pm yesterday and passed a small amount of red blood with a BM. She went to bed and awakened at 4am today and she saw red blood on her bed sheets and she went to the bathroom and passed a large amount of dark red blood with clots. No associated abdominal or rectal pain. No chest pain, dizziness or shortness of breath.  No further hematochezia since arriving to the ED.  No nausea or vomiting.  No GERD symptoms or dysphagia.  She takes Meloxicam 15 mg daily for generalized arthritis pain since 10/2021.  She ran out of Meloxicam yesterday therefore she took 1 dose of ASA 325 mg in the morning.  Non-smoker.  No alcohol use.  She is hemodynamically stable.  SUMMARY OF PAST HOSPITALIZATIONS WITH GI  BLEED:  She has a history of recurrent GI bleeding, presumably diverticular bleeds. She was admitted to the hospital 10/12 - 04/20/2018 with bright red hematochezia. Tagged red blood cell scan 04/17/2018 was positive for active GI bleeding likely originating in the distal stomach or  proximal duodenum.  An EGD was done showed gastritis without evidence of active bleeding.  Diverticular bleed was suspected.  Her H&H remained stable after transfused with 2 units of PRBCs without further hematochezia and she was discharged home.  Admitted to the hospital 02/12/2019 with hematochezia.  Hemoglobin 8.7.  Transfused 1 unit PRBCs.  She underwent a colonoscopy during this hospitalization which showed diverticulosis in the entire examined colon, blood was present in the distal descending, sigmoid, rectosigmoid and rectum and two 2 to 5 mm polyps were seen in the transverse and ascending colon but were not resected in setting of active GI bleed. Hemoglobin level stabilized and she did not demonstrate any active bleeding post colonoscopy and was subsequently discharged home.  Admitted to the hospital  in 04/2020 with hematochezia. CT angio at that time showed focal hemorrhage in the distal transverse colon but then no active bleeding on subsequent angiography by IR.   She was admitted to the hospital 11/27 - 06/05/2021 with left-sided abdominal pain with hematochezia. Admission hemoglobin was 9.8 with a baseline hemoglobin of 14. CTAP showed diverticulosis with possible mild acute diverticulitis in the distal descending/proximal sigmoid (vs underdistention). Persistent calcified pancreatic stone. Cholelithiasis without cholecystitis or duct dilation.CT angio showed patent vasculature without extravasation, diverticulosis, moderate thickening/inflammation of the mid to distal descending colon and cholelithiasis without cholecystitis. She was treated conservatively with IV antibiotics with consideration of repeat colonoscopy if she developed recurrent bleeding.  She did not demonstrate any further active  GI bleeding and was discharged home without requiring oral antibiotics.  The globin level was 8.4 at discharge.  She was readmitted to the hospital 06/08/2021 with recurrent dark red hematochezia.   Admission hemoglobin 7.4.  Received 3 units of PRBCs throughout that admission and IV iron. CTA was negative for active GI bleeding.  She underwent a colonoscopy 06/12/2021 which showed diverticulosis throughout the colon without identifying etiology for recurrent lower GI bleed.  Her hemoglobin stabilized without further hematochezia therefore she was discharged home.  GI PROCEDURES:  Colonoscopy 06/12/2021 as an inpatient by Dr. Myrtie Neither: - Preparation of the colon was fair.  - Decreased sphincter tone found on digital rectal exam.  - The examined portion of the ileum was normal.  - Diverticulosis in the entire examined colon.  - The examination was otherwise normal on direct and retroflexion views.  - No specimens collected. Unable to localize culprit diverticulum by imaging or colonoscopy for this episode of bleeding  Colonoscopy 02/15/2019: - Diverticulosis in the entire examined colon.  - Blood in the rectum, in the recto-sigmoid colon, in the sigmoid colon and in the distal descending colon.  - The distal rectum and anal verge are normal on retroflexion view.  - Two 2 to 5 mm polyps in the transverse colon and in the ascending colon. The polyps were 2 to 5 mm in size. Given the obscure GI bleed, these were not resected so to not complicate the course with a possible post polypectomy ble - The examined portion of the ileum was normal.  - No specimens collected.  EGD 04/18/2018:  - Small hiatal hernia.  - Gastritis.  - Normal duodenal bulb, first portion of the duodenum and second portion of the duodenum.  - No source of bleeding (current or recent or remote) in the upper GI tract.   Colonoscopy 09/15/2005: Diverticulosis in the ascending to sigmoid colon, recent reddish blood in the colon but no blood in the terminal ileum  Past Medical History:  Diagnosis Date   Acute GI bleeding 06/02/2021   Acute posthemorrhagic anemia    Arthritis    Diverticulitis    GERD (gastroesophageal  reflux disease)    GI bleed    GIB (gastrointestinal bleeding) 02/12/2019   Hypertension    Lower GI bleed 04/17/2018    Past Surgical History:  Procedure Laterality Date   ABDOMINAL HYSTERECTOMY     COLONOSCOPY Left 02/15/2019   Procedure: COLONOSCOPY;  Surgeon: Shellia Cleverly, DO;  Location: [facility] ENDOSCOPY;  Service: Gastroenterology;  Laterality: Left;   COLONOSCOPY WITH PROPOFOL N/A 06/12/2021   Procedure: COLONOSCOPY WITH PROPOFOL;  Surgeon: Sherrilyn Rist, MD;  Location: [facility] ENDOSCOPY;  Service: Gastroenterology;  Laterality: N/A;   ESOPHAGOGASTRODUODENOSCOPY (EGD) WITH PROPOFOL Left 04/18/2018   Procedure: ESOPHAGOGASTRODUODENOSCOPY (EGD) WITH PROPOFOL;  Surgeon: Willis Modena, MD;  Location: [facility] ENDOSCOPY;  Service: Endoscopy;  Laterality: Left;   IR ANGIOGRAM SELECTIVE EACH ADDITIONAL VESSEL  04/27/2020   IR ANGIOGRAM SELECTIVE EACH ADDITIONAL VESSEL  04/27/2020   IR ANGIOGRAM SELECTIVE EACH ADDITIONAL VESSEL  04/27/2020   IR ANGIOGRAM VISCERAL SELECTIVE  04/26/2020   IR ANGIOGRAM VISCERAL SELECTIVE  04/26/2020   IR ANGIOGRAM VISCERAL SELECTIVE  04/27/2020   IR US GUIDE VASC ACCESS RIGHT  04/26/2020   IR US GUIDE VASC ACCESS RIGHT  04/27/2020    Prior to Admission medications   Medication Sig Start Date End Date Taking? Authorizing Provider  amLODipine (NORVASC) 10 MG tablet Take 10 mg by mouth daily.  [provider]  diclofenac Sodium (VOLTAREN) 1 % GEL Apply 2 g topically 4 (four) times daily. Patient taking differently: Apply 2 g topically 2 (two) times daily as needed (pain). 04/30/20   Burnadette Pop, MD  famotidine (PEPCID) 20 MG tablet Take 1 tablet (20 mg total) by mouth daily. Patient taking differently: Take 20 mg by mouth daily before breakfast. 06/05/21 06/05/22  Ghimire, Werner Lean, MD  feeding supplement, ENSURE ENLIVE, (ENSURE ENLIVE) LIQD Take 237 mLs by mouth 2 (two) times daily between meals. 02/17/19   [name], DO  ferrous  sulfate (FEROSUL) 325 (65 FE) MG tablet Take 1 tablet (325 mg total) by mouth 2 (two) times daily with a meal. TAKE 2 TABLETS(650 MG) BY MOUTH EVERY OTHER DAY Strength: 325 (65 Fe) mg 06/14/21   Zannie Cove, MD  gabapentin (NEURONTIN) 100 MG capsule Take 1 capsule (100 mg total) by mouth at bedtime. 04/13/23   Nche, Bonna Gains, NP  lactose free nutrition (BOOST) LIQD Take 237 mLs by mouth daily.    [provider]  loratadine (CLARITIN) 10 MG tablet Take 1 tablet (10 mg total) by mouth daily. 12/02/22   Linwood Dibbles, MD  meloxicam (MOBIC) 15 MG tablet Take 15 mg by mouth daily.    [provider]  Menthol, Topical Analgesic, (BIOFREEZE) 4 % GEL Apply 1 application topically 2 (two) times daily.    [provider]  Multiple Vitamin (MULTIVITAMIN WITH MINERALS) TABS tablet Take 1 tablet by mouth daily. 02/18/19   [name], DO  Potassium 99 MG TABS Take 99 mg by mouth every morning.    [provider]    No current facility-administered medications for this encounter.   Current Outpatient Medications  Medication Sig Dispense Refill   amLODipine (NORVASC) 10 MG tablet Take 10 mg by mouth daily.     diclofenac Sodium (VOLTAREN) 1 % GEL Apply 2 g topically 4 (four) times daily. (Patient taking differently: Apply 2 g topically 2 (two) times daily as needed (pain).) 150 g 2   famotidine (PEPCID) 20 MG tablet Take 1 tablet (20 mg total) by mouth daily. (Patient taking differently: Take 20 mg by mouth daily before breakfast.) 30 tablet 0   feeding supplement, ENSURE ENLIVE, (ENSURE ENLIVE) LIQD Take 237 mLs by mouth 2 (two) times daily between meals. 237 mL 12   ferrous sulfate (FEROSUL) 325 (65 FE) MG tablet Take 1 tablet (325 mg total) by mouth 2 (two) times daily with a meal. TAKE 2 TABLETS(650 MG) BY MOUTH EVERY OTHER DAY Strength: 325 (65 Fe) mg 90 tablet 1   gabapentin (NEURONTIN) 100 MG capsule Take 1 capsule (100 mg total) by mouth at bedtime. 30  capsule 5   lactose free nutrition (BOOST) LIQD Take 237 mLs by mouth daily.     loratadine (CLARITIN) 10 MG tablet Take 1 tablet (10 mg total) by mouth daily. 90 tablet 0   meloxicam (MOBIC) 15 MG tablet Take 15 mg by mouth daily.     Menthol, Topical Analgesic, (BIOFREEZE) 4 % GEL Apply 1 application topically 2 (two) times daily.     Multiple Vitamin (MULTIVITAMIN WITH MINERALS) TABS tablet Take 1 tablet by mouth daily. 30 tablet 0   Potassium 99 MG TABS Take 99 mg by mouth every morning.      Allergies as of 05/08/2023   (No Known Allergies)    Family History  Problem Relation Age of Onset   Hypertension Sister    Colon cancer  Neg Hx    Esophageal cancer Neg Hx     Social History   Socioeconomic History   Marital status: Single    Spouse name: Not on file   Number of children: Not on file   Years of education: Not on file   Highest education level: Not on file  Occupational History   Occupation: retired  Tobacco Use   Smoking status: Never   Smokeless tobacco: Never  Vaping Use   Vaping status: Never Used  Substance and Sexual Activity   Alcohol use: Not Currently    Alcohol/week: 0.0 standard drinks of alcohol   Drug use: Never   Sexual activity: Not on file  Other Topics Concern   Not on file  Social History Narrative   Marital status: single; not dating in 2018; moved from [place] to be near sister; not happy in [place].  Lived [place] for 50 years.      Children: no children      Lives: alone with cat      Employment: retired; Architectural technologist with computers      Tobacco:       Alcohol:       ADLs; independent with ADLs; drives   Social Determinants of Health   Financial Resource Strain: Low Risk  (09/09/2022)   Received from [facility], [facility]   Overall Financial Resource Strain (CARDIA)    Difficulty of Paying Living Expenses: Not very hard  Food Insecurity: No Food Insecurity (09/09/2022)   Received from [facility], [facility]   Hunger  Vital Sign    Worried About Running Out of Food in the Last Year: Never true    Ran Out of Food in the Last Year: Never true  Transportation Needs: No Transportation Needs (09/09/2022)   Received from [facility], [facility]   PRAPARE - Transportation    Lack of Transportation (Medical): No    Lack of Transportation (Non-Medical): No  Physical Activity: Sufficiently Active (04/01/2021)   Received from [facility], [facility]   Exercise Vital Sign    Days of Exercise per Week: 7 days    Minutes of Exercise per Session: 30 min  Stress: Stress Concern Present (04/01/2021)   Received from [facility], [facility] - Occupational Stress Questionnaire    Feeling of Stress : To some extent  Social Connections: Unknown (11/03/2021)   Received from [facility], [facility]   Social Network    Social Network: Not on file  Intimate Partner Violence: Unknown (10/07/2021)   Received from [facility], [facility]   HITS    Physically Hurt: Not on file    Insult or Talk Down To: Not on file    Threaten Physical Harm: Not on file    Scream or Curse: Not on file    Review of Systems: Gen: Denies fever, sweats or chills. No weight loss.  CV: Denies chest pain, palpitations or edema. Resp: Denies cough, shortness of breath of hemoptysis.  GI: See HPI. GU : Denies urinary burning, blood in urine, increased urinary frequency or incontinence. MS: + Arthritis.  Derm: Denies rash, itchiness, skin lesions or unhealing ulcers. Psych: Denies depression, anxiety, memory loss or confusion. Heme: Denies easy bruising, bleeding. Neuro:  Denies headaches, dizziness or paresthesias. Endo:  Denies any problems with DM, thyroid or adrenal function.  Physical Exam: Vital signs in last 24 hours: Temp:  [98 F (36.7 C)] 98 F (36.7 C) (11/01 0543)  Pulse Rate:  [89] 89 (11/01 0543) BP: (123)/(86) 123/86 (11/01 0543) SpO2:  [99 %] 99 % (11/01  0543) Weight:  [62 kg] 62 kg (11/01 0543)   General: Alert 87 year old female in no acute distress. Head:  Normocephalic and atraumatic. Eyes:  No scleral icterus. Conjunctiva pink. Ears:  Normal auditory acuity. Nose:  No deformity, discharge or lesions. Mouth: Upper and lower dentures intact.  No ulcers or lesions.  Neck:  Supple. No lymphadenopathy or thyromegaly.  Lungs: Breath sounds clear throughout. No wheezes, rhonchi or crackles.  Heart: Heart rhythm slightly irregular. No murmurs. Abdomen: Soft, nondistended.  Mild tenderness to the central lower abdomen without rebound or guarding.  Positive bowel sounds all 4 quadrants.  No palpable mass.  No hepatosplenomegaly.  No bruit. Rectal: Rectal exam showed dark red blood in the rectal vault, no melena.  ED RN at the bedside at time of exam. Musculoskeletal:  Symmetrical without gross deformities.  Pulses:  Normal pulses noted. Extremities:  Without clubbing or edema. Neurologic:  Alert and  oriented x 4.  Speech is clear.  Moves all extremities equally. Skin:  Intact without significant lesions or rashes. Psych:  Alert and cooperative. Normal mood and affect.    Lab Results: Recent Labs    05/08/23 0615  WBC 5.2  HGB 10.8*  HCT 31.9*  PLT 152   BMET Recent Labs    05/08/23 0615  NA 137  K 3.4*  CL 106  CO2 26  GLUCOSE 121*  BUN 18  CREATININE 0.75  CALCIUM 9.4   LFT Recent Labs    05/08/23 0615  PROT 6.0*  ALBUMIN 3.3*  AST 17  ALT 15  ALKPHOS 69  BILITOT 0.6     IMPRESSION/PLAN:  87 year old female with a history of recurrent lower GI bleed, presumed diverticular  presents with painless dark red hematochezia.  On Meloxicam daily chronically, took ASA 325 mg x 1 dose on 10/31 for arthritis.  Admission hemoglobin 10.8 (Hg 14.2 on 12/15/2022). BUN 18.  No active hematochezia since arriving to the ED.  Suspect recurrent diverticular bleed.  Less likely brisk upper GI bleed with normal BUN. -Clear liquid  diet -CTA if patient demonstrates active GI bleeding  -Check H&H every 6 hours x 24 hours -Transfuse for hemoglobin less than 7 or as needed if symptomatic -Avoid NSAIDs -No recommendations for endoscopic evaluation at this time  History of GERD. Chronic NSAID use.  -Famotidine 20mg  po QD  History of two 2 to 5 mm polyps in the transverse and ascending colon seen per colonoscopy 02/2019 which were not removed in the setting of active GI bleeding.  Colonoscopy 06/2021 done due to recurrent hematochezia did not identify any colon polyps.     Arnaldo Natal  05/08/2023, 10:55 AM   Gastroenterology attending:  I have seen and evaluated the patient myself and reviewed the chart of the admission as well as past medical history.  Agree with the nurse practitioner note above, with the following additions:  This is an elderly black woman with a recurrent diverticular bleed based upon the clinical scenario.  She seems to be slowing right now.  She needs continued observation and supportive care.  I do not think there is any role for endoscopic intervention at this time.  NSAID use may have contributed, would hold and see if acetaminophen would handle her arthritic complaints.  Recent Labs  Lab 05/08/23 0615 05/08/23 1007  HGB 10.8* 11.6*  HCT 31.9* 34.3*  WBC 5.2  --  PLT 152  --     Dr. Elnoria Howard covering this weekend.  If patient has 24-48 hrs of stability w/o sig hematochezia may dc  Iva Boop, MD, [facility] See Loretha Stapler on call - gastroenterology for best contact person 05/08/2023 3:43 PM

## 2023-05-08 NOTE — ED Triage Notes (Signed)
Pt arrived POV d/t having dark red blood in stool when she wipes.  Pt states she has hx diverticulitis.  No pain onset yesterday.

## 2023-05-08 NOTE — ED Provider Notes (Signed)
[place] EMERGENCY DEPARTMENT AT [facility] Provider Note   CSN: [id number] Arrival date & time: 05/08/23  7425     History  Chief Complaint  Patient presents with   Blood In Stools    l    Latoya Bennett is a 87 y.o. female.  HPI    87 year old female comes in with chief complaint of bloody stools.  Patient has history of diverticulosis with hematochezia, anemia secondary to blood loss, GERD.  Patient states that she had a bowel movement last night around 6 PM, which is unusual for her.  The bowel movement had blood in it.  She had no associated abdominal pain.  This morning she had even larger bowel movement with blood clots in it.  Patient has slight dizziness and decided to come to the ER.  She does not take any blood thinners.  Symptoms are similar to her previous diverticular bleed.   Home Medications Prior to Admission medications   Medication Sig Start Date End Date Taking? Authorizing Provider  amLODipine (NORVASC) 10 MG tablet Take 10 mg by mouth daily.    [provider]  diclofenac Sodium (VOLTAREN) 1 % GEL Apply 2 g topically 4 (four) times daily. Patient taking differently: Apply 2 g topically 2 (two) times daily as needed (pain). 04/30/20   Burnadette Pop, MD  famotidine (PEPCID) 20 MG tablet Take 1 tablet (20 mg total) by mouth daily. Patient taking differently: Take 20 mg by mouth daily before breakfast. 06/05/21 06/05/22  Ghimire, Werner Lean, MD  feeding supplement, ENSURE ENLIVE, (ENSURE ENLIVE) LIQD Take 237 mLs by mouth 2 (two) times daily between meals. 02/17/19   [name], DO  ferrous sulfate (FEROSUL) 325 (65 FE) MG tablet Take 1 tablet (325 mg total) by mouth 2 (two) times daily with a meal. TAKE 2 TABLETS(650 MG) BY MOUTH EVERY OTHER DAY Strength: 325 (65 Fe) mg 06/14/21   Zannie Cove, MD  gabapentin (NEURONTIN) 100 MG capsule Take 1 capsule (100 mg total) by mouth at bedtime. 04/13/23   Nche, Bonna Gains, NP   lactose free nutrition (BOOST) LIQD Take 237 mLs by mouth daily.    [provider]  loratadine (CLARITIN) 10 MG tablet Take 1 tablet (10 mg total) by mouth daily. 12/02/22   Linwood Dibbles, MD  meloxicam (MOBIC) 15 MG tablet Take 15 mg by mouth daily.    [provider]  Menthol, Topical Analgesic, (BIOFREEZE) 4 % GEL Apply 1 application topically 2 (two) times daily.    [provider]  Multiple Vitamin (MULTIVITAMIN WITH MINERALS) TABS tablet Take 1 tablet by mouth daily. 02/18/19   [name], DO  Potassium 99 MG TABS Take 99 mg by mouth every morning.    [provider]      Allergies    Patient has no known allergies.    Review of Systems   Review of Systems  All other systems reviewed and are negative.   Physical Exam Updated Vital Signs BP 123/86   Pulse 89   Temp 98 F (36.7 C) (Oral)   Ht 4\' 8"  (1.422 m)   Wt 62 kg   SpO2 99%   BMI 30.64 kg/m  Physical Exam Vitals and nursing note reviewed.  Constitutional:      Appearance: She is well-developed.  HENT:     Head: Atraumatic.  Eyes:     Extraocular Movements: Extraocular movements intact.     Pupils: Pupils are equal, round, and reactive to  light.  Cardiovascular:     Rate and Rhythm: Normal rate.  Pulmonary:     Effort: Pulmonary effort is normal.  Abdominal:     Tenderness: There is no abdominal tenderness.  Musculoskeletal:     Cervical back: Normal range of motion and neck supple.  Skin:    General: Skin is warm and dry.  Neurological:     Mental Status: She is alert and oriented to person, place, and time.     ED Results / Procedures / Treatments   Labs (all labs ordered are listed, but only abnormal results are displayed) Labs Reviewed  COMPREHENSIVE METABOLIC PANEL - Abnormal; Notable for the following components:      Result Value   Potassium 3.4 (*)    Glucose, Bld 121 (*)    Total Protein 6.0 (*)    Albumin 3.3 (*)    All other components within  normal limits  CBC - Abnormal; Notable for the following components:   Hemoglobin 10.8 (*)    HCT 31.9 (*)    MCV 77.1 (*)    All other components within normal limits  LIPASE, BLOOD  URINALYSIS, ROUTINE W REFLEX MICROSCOPIC    EKG None  Radiology No results found.  Procedures Procedures    Medications Ordered in ED Medications - No data to display  ED Course/ Medical Decision Making/ A&P                                 Medical Decision Making Amount and/or Complexity of Data Reviewed Labs: ordered.  Risk Decision regarding hospitalization.   This patient presents to the ED with chief complaint(s) of bloody stools with pertinent past medical history of diverticulosis and associated complications including blood loss anemia.The complaint involves an extensive differential diagnosis and also carries with it a high risk of complications and morbidity.    The differential diagnosis includes  Diverticular bleed Colon cancer Rectal bleed Internal hemorrhoids External hemorrhoids  The initial plan is to get basic labs including CBC.   Additional history obtained: Records reviewed previous admission documents and previous endoscopies.  Patient had significant diverticulosis noted by [facility] colonoscopy few years back.  Independent labs interpretation:  The following labs were independently interpreted: Hemoglobin today is 10.8.  Hemoglobin is dropped from 14.2. Creatinine is normal.  Hemodynamics at this time are stable.   Treatment and Reassessment: Patient will need admission for diverticulosis and diverticular hemorrhage. She previously has had anemia with hemoglobin dropping to 7.7.  She already has had almost 4 g of hemoglobin drop this time and is having dizziness.  Consultation: - Consulted or discussed management/test interpretation with external professional: GI doctors.  [facility] is on board.  Dr. Leone Payor to see the patient.   Final Clinical  Impression(s) / ED Diagnoses Final diagnoses:  History of GI diverticular bleed  Hematochezia    Rx / DC Orders ED Discharge Orders     None         Derwood Kaplan, MD 05/08/23 [phone]

## 2023-05-09 DIAGNOSIS — K922 Gastrointestinal hemorrhage, unspecified: Secondary | ICD-10-CM

## 2023-05-09 LAB — COMPREHENSIVE METABOLIC PANEL
ALT: 14 U/L (ref 0–44)
AST: 15 U/L (ref 15–41)
Albumin: 2.9 g/dL — ABNORMAL LOW (ref 3.5–5.0)
Alkaline Phosphatase: 58 U/L (ref 38–126)
Anion gap: 5 (ref 5–15)
BUN: 12 mg/dL (ref 8–23)
CO2: 24 mmol/L (ref 22–32)
Calcium: 9.3 mg/dL (ref 8.9–10.3)
Chloride: 109 mmol/L (ref 98–111)
Creatinine, Ser: 0.59 mg/dL (ref 0.44–1.00)
GFR, Estimated: >60 mL/min (ref >60)
Glucose, Bld: 100 mg/dL — ABNORMAL HIGH (ref 70–99)
Potassium: 3.5 mmol/L (ref 3.5–5.1)
Sodium: 138 mmol/L (ref 135–145)
Total Bilirubin: 0.6 mg/dL (ref 0.3–1.2)
Total Protein: 5.1 g/dL — ABNORMAL LOW (ref 6.5–8.1)

## 2023-05-09 LAB — CBC
HCT: 26.7 % — ABNORMAL LOW (ref 36.0–46.0)
HCT: 29.4 % — ABNORMAL LOW (ref 36.0–46.0)
Hemoglobin: 9.5 g/dL — ABNORMAL LOW (ref 12.0–15.0)
Hemoglobin: 10 g/dL — ABNORMAL LOW (ref 12.0–15.0)
MCH: 27.3 pg (ref 26.0–34.0)
MCH: 26.1 pg (ref 26.0–34.0)
MCHC: 35.6 g/dL (ref 30.0–36.0)
MCHC: 34 g/dL (ref 30.0–36.0)
MCV: 76.7 fL — ABNORMAL LOW (ref 80.0–100.0)
MCV: 76.8 fL — ABNORMAL LOW (ref 80.0–100.0)
Platelets: 111 10*3/uL — ABNORMAL LOW (ref 150–400)
Platelets: 146 10*3/uL — ABNORMAL LOW (ref 150–400)
RBC: 3.48 MIL/uL — ABNORMAL LOW (ref 3.87–5.11)
RBC: 3.83 MIL/uL — ABNORMAL LOW (ref 3.87–5.11)
RDW: 14.8 % (ref 11.5–15.5)
RDW: 14.8 % (ref 11.5–15.5)
WBC: 3.1 10*3/uL — ABNORMAL LOW (ref 4.0–10.5)
WBC: 3.9 10*3/uL — ABNORMAL LOW (ref 4.0–10.5)
nRBC: 0 % (ref 0.0–0.2)
nRBC: 0 % (ref 0.0–0.2)

## 2023-05-09 MED ORDER — POLYETHYLENE GLYCOL 3350 17 G PO PACK
17.0000 g | PACK | Freq: Every day | ORAL | Status: DC
  Administered 2023-05-09: 17 g via ORAL
  Filled 2023-05-09 (×2): qty 1

## 2023-05-09 MED ORDER — BOOST PO LIQD
237.0000 mL | Freq: Every day | ORAL | Status: DC
  Administered 2023-05-09 – 2023-05-10 (×2): 237 mL via ORAL
  Filled 2023-05-09 (×2): qty 237

## 2023-05-09 MED ORDER — FERROUS SULFATE 325 (65 FE) MG PO TABS
325.0000 mg | ORAL_TABLET | Freq: Two times a day (BID) | ORAL | Status: DC
  Administered 2023-05-09 – 2023-05-10 (×3): 325 mg via ORAL
  Filled 2023-05-09 (×3): qty 1

## 2023-05-09 MED ORDER — LORATADINE 10 MG PO TABS
10.0000 mg | ORAL_TABLET | Freq: Every day | ORAL | Status: DC
  Administered 2023-05-09 – 2023-05-10 (×2): 10 mg via ORAL
  Filled 2023-05-09 (×2): qty 1

## 2023-05-09 NOTE — Progress Notes (Signed)
Gastroenterology Progress Note/covering for Dr. Loreta Ave and [facility] 87 y.o. Jan 03, 1936/[facility] patient  CC: GI bleed   Subjective: Patient seen and examined at bedside.  She is feeling better.  Denies any further bleeding although she has not had any bowel movement since yesterday.  ROS : Afebrile, negative for chest pain   Objective: Vital signs in last 24 hours: Vitals:   05/09/23 1000 05/09/23 1100  BP:    Pulse: 78 85  Resp: 20 13  Temp:    SpO2: 97% 99%    Physical Exam: Elderly patient, not in acute distress.  Abdominal exam benign.  She is alert and oriented x 3.  Somewhat anxious   Lab Results: Recent Labs    05/08/23 0615 05/09/23 0304  NA 137 138  K 3.4* 3.5  CL 106 109  CO2 26 24  GLUCOSE 121* 100*  BUN 18 12  CREATININE 0.75 0.59  CALCIUM 9.4 9.3  MG 2.2  --   PHOS 2.5  --    Recent Labs    05/08/23 0615 05/09/23 0304  AST 17 15  ALT 15 14  ALKPHOS 69 58  BILITOT 0.6 0.6  PROT 6.0* 5.1*  ALBUMIN 3.3* 2.9*   Recent Labs    05/08/23 0615 05/08/23 1007 05/08/23 2240 05/09/23 0304  WBC 5.2  --   --  3.1*  HGB 10.8*   < > 9.8* 9.5*  HCT 31.9*   < > 29.0* 26.7*  MCV 77.1*  --   --  76.7*  PLT 152  --   --  111*   < > = values in this interval not displayed.   No results for input(s): "LABPROT", "INR" in the last 72 hours.    Assessment/Plan: -Painless hematochezia in a patient with history of recurrent GI bleed in the past.  Most likely diverticular bleed.  Looks like patient had a colonoscopy in 2020, 2022 as well as IR angiogram in the past. -Chronic constipation -NSAID use.  Was taking meloxicam.  Recommendations ----------------------- -Patient most likely has recurrent diverticular bleed.  She has history of chronic constipation and takes NSAIDs for arthritis.  Advised her to avoid NSAIDs.  Take MiraLAX on a regular basis to avoid constipation.  Increase fiber supplements. -If no further bleeding episodes,  okay to discharge from GI standpoint.  If patient remains hospitalized, okay to transfer her out of ICU -Advance diet to soft.  No plan for inpatient colonoscopy at this time. -GI will sign off.  Call us back if needed.  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  If any follow-up needed, please arrange for follow-up with [facility].   Kathi Der MD, FACP 05/09/2023, 12:01 PM  Contact #  [phone]

## 2023-05-09 NOTE — Evaluation (Signed)
Physical Therapy Evaluation Patient Details Name: Latoya Bennett MRN: [id number] DOB: 02-Aug-1935 Today's Date: 05/09/2023  History of Present Illness  Latoya Bennett is a 87 y.o. female with medical history significant of hypertension, PACs, history of tachycardia, class I obesity, chronic pain of multiple joints, gout, GERD, diverticulosis, diverticulitis, history of lower GI bleed and ABLA who presented to the emergency department 05/08/23 complaints of having 2 large dark red blood bowel movements associated with mild lightheadedness w  Clinical Impression  Pt admitted with above diagnosis.  Pt currently with functional limitations due to the deficits listed below (see PT Problem List). Pt will benefit from acute skilled PT to increase their independence and safety with mobility to allow discharge.     The patient  is very independent, still drives.  Patient ambulated in room using Rw, reports that she does not prefer RW at this time.  Patient is independent and drives,uses cane as needed. Presents with significant  knee deformities but  mobilizes well.  Patient  olans to DC back  to her home.      If plan is discharge home, recommend the following: A little help with bathing/dressing/bathroom;Assistance with feeding;Direct supervision/assist for medications management   Can travel by private vehicle        Equipment Recommendations None recommended by PT  Recommendations for Other Services       Functional Status Assessment Patient has had a recent decline in their functional status and demonstrates the ability to make significant improvements in function in a reasonable and predictable amount of time.     Precautions / Restrictions Precautions Precautions: Fall      Mobility  Bed Mobility Overal bed mobility: Independent                  Transfers Overall transfer level: Independent                      Ambulation/Gait Ambulation/Gait  assistance: Supervision Gait Distance (Feet): 20 Feet Assistive device: Rolling walker (2 wheels) Gait Pattern/deviations: Step-through pattern Gait velocity: decr     General Gait Details: able to  ambulate  to and from BR using RW, gaitm steady  Stairs            Wheelchair Mobility     Tilt Bed    Modified Rankin (Stroke Patients Only)       Balance Overall balance assessment: Mild deficits observed, not formally tested                                           Pertinent Vitals/Pain Pain Assessment Pain Assessment: No/denies pain    Home Living Family/patient expects to be discharged to:: Private residence Living Arrangements: Alone Available Help at Discharge: Family Type of Home: House Home Access: Ramped entrance       Home Layout: One level Home Equipment: Cane - single point Additional Comments: has a frined who helps with groceries, etc    Prior Function Prior Level of Function : Independent/Modified Independent;Driving             Mobility Comments: works in yard, DME outside       [employer] Assessment   Upper Extremity Assessment Upper Extremity Assessment: Overall WFL for tasks assessed    Lower Extremity Assessment Lower Extremity Assessment: RLE deficits/detail;LLE deficits/detail RLE Deficits / Details: varus knee LLE Deficits / Details:  varus knee    Cervical / Trunk Assessment Cervical / Trunk Assessment: Normal  Communication   Communication Communication: No apparent difficulties  Cognition Arousal: Alert Behavior During Therapy: WFL for tasks assessed/performed Overall Cognitive Status: Within Functional Limits for tasks assessed                                          General Comments      Exercises     Assessment/Plan    PT Assessment Patient needs continued PT services  PT Problem List Decreased strength;Decreased mobility;Decreased activity tolerance;Decreased  range of motion;Decreased knowledge of precautions       PT Treatment Interventions DME instruction;Therapeutic exercise;Gait training;Therapeutic activities;Patient/family education    PT Goals (Current goals can be found in the Care Plan section)  Acute Rehab PT Goals Patient Stated Goal: go home PT Goal Formulation: With patient Time For Goal Achievement: 05/23/23 Potential to Achieve Goals: Good    Frequency Min 1X/week     Co-evaluation               AM-PAC PT "6 Clicks" Mobility  Outcome Measure Help needed turning from your back to your side while in a flat bed without using bedrails?: None Help needed moving from lying on your back to sitting on the side of a flat bed without using bedrails?: None Help needed moving to and from a bed to a chair (including a wheelchair)?: None Help needed standing up from a chair using your arms (e.g., wheelchair or bedside chair)?: None Help needed to walk in hospital room?: A Little Help needed climbing 3-5 steps with a railing? : A Little 6 Click Score: 22    End of Session Equipment Utilized During Treatment: Gait belt Activity Tolerance: Patient tolerated treatment well Patient left: in bed Nurse Communication: Mobility status PT Visit Diagnosis: Unsteadiness on feet (R26.81)    Time: 1610-9604 PT Time Calculation (min) (ACUTE ONLY): 17 min   Charges:   PT Evaluation $PT Eval Low Complexity: 1 Low   PT General Charges $$ ACUTE PT VISIT: 1 Visit         Blanchard Kelch PT Acute Rehabilitation Services Office [phone] Weekend [phone]   Rada Hay 05/09/2023, 2:34 PM

## 2023-05-09 NOTE — Progress Notes (Signed)
[facility] Progress Note Patient: Latoya Bennett [place] ZOX:[id number] DOB: 04-Jul-1936 DOA: 05/08/2023  DOS: the patient was seen and examined on 05/09/2023  Brief hospital course: PMH of HTN, PA-C, GERD, obesity, recurrent lower GI bleed due to diverticulosis present to the hospital with complaints of BRBPR. Continues to have some clots in her stool but GI consulted.  Recommend conservative measures but  Assessment and Plan: BRBPR in the setting of lower GI bleed secondary to diverticulosis. At present H&H relatively stable GI recommended conservative management No intervention recommended. If H&H remains stable patient can actually be discharged from GI perspective.  Chronic blood loss anemia. I do not think the patient's hemoglobin of 14.2 12/15/2022 is a reliable but if we believe that, patient's hemoglobin at the time of admission was 10.8. Currently reassuringly her hemoglobin remains around 10. She continues to have some BRBPR. Will monitor.  HTN. Blood pressure medication on hold.  Gout. Continue to monitor.  Outpatient follow-up with  Hypokalemia Replace.  GERD Continue PPI.  Obesity Class 1 Body mass index is 30.59 kg/m.  Placing the pt at higher risk of poor outcomes.  Subjective: No abdominal pain.  No nausea, vomiting or fever no chills.  Hungry.  Physical Exam: General: in Mild distress, No Rash Cardiovascular: S1 and S2 Present, No Murmur Respiratory: Good respiratory effort, Bilateral Air entry present. No Crackles, No wheezes Abdomen: Bowel Sound present, No tenderness Extremities: No edema Neuro: Alert and oriented x3, no new focal deficit  Data Reviewed: I have Reviewed nursing notes, Vitals, and Lab results. Since last encounter, pertinent lab results CBC and BMP   . I have ordered test including CBC and BMP  . I have discussed pt's care plan and test results with [facility]  .   Disposition: Status is: Observation SCDs Start: 05/08/23  0915   Family Communication: No one at bedside Level of care: Telemetry continue telemetry Vitals:   05/09/23 1308 05/09/23 1400 05/09/23 1500 05/09/23 1600  BP:  114/61 119/67   Pulse:  83 83 81  Resp:  (!) 34 (!) 23 19  Temp: 97.6 F (36.4 C)     TempSrc: Oral     SpO2:  98% 98% 99%  Weight:      Height:         Author: Lynden Oxford, MD 05/09/2023 4:41 PM  Please look on [email or web address] to find out who is on call.

## 2023-05-10 DIAGNOSIS — K922 Gastrointestinal hemorrhage, unspecified: Secondary | ICD-10-CM | POA: Diagnosis not present

## 2023-05-10 LAB — CBC
HCT: 27.6 % — ABNORMAL LOW (ref 36.0–46.0)
Hemoglobin: 9.7 g/dL — ABNORMAL LOW (ref 12.0–15.0)
MCH: 26.4 pg (ref 26.0–34.0)
MCHC: 35.1 g/dL (ref 30.0–36.0)
MCV: 75 fL — ABNORMAL LOW (ref 80.0–100.0)
Platelets: 138 10*3/uL — ABNORMAL LOW (ref 150–400)
RBC: 3.68 MIL/uL — ABNORMAL LOW (ref 3.87–5.11)
RDW: 14.8 % (ref 11.5–15.5)
WBC: 3.9 10*3/uL — ABNORMAL LOW (ref 4.0–10.5)
nRBC: 0 % (ref 0.0–0.2)

## 2023-05-10 MED ORDER — POLYETHYLENE GLYCOL 3350 17 G PO PACK: 17.0000 g | PACK | Freq: Every day | ORAL | 0 refills | Status: AC

## 2023-05-10 NOTE — Progress Notes (Signed)
Mobility Specialist - Progress Note   05/10/23 1012  Mobility  Activity Ambulated with assistance in hallway;Ambulated with assistance to bathroom  Level of Assistance Standby assist, set-up cues, supervision of patient - no hands on  Assistive Device Front wheel walker  Distance Ambulated (ft) 250 ft  Activity Response Tolerated well  Mobility Referral Yes  $Mobility charge 1 Mobility  Mobility Specialist Start Time (ACUTE ONLY) [id number]  Mobility Specialist Stop Time (ACUTE ONLY) 1009  Mobility Specialist Time Calculation (min) (ACUTE ONLY) 15 min   Pt received in bed and agreeable to mobility. Prior to ambulating, pt requested assistance to bathroom. No complaints during session. Pt to bed after session with all needs met. Bed alarm on.    [facility]

## 2023-05-10 NOTE — Plan of Care (Signed)

## 2023-05-11 ENCOUNTER — Ambulatory Visit: Payer: Medicare Other | Admitting: Podiatry

## 2023-05-11 ENCOUNTER — Telehealth: Payer: Self-pay

## 2023-05-11 NOTE — Transitions of Care (Post Inpatient/ED Visit) (Signed)
05/11/2023  Name: Latoya Bennett MRN: [id number] DOB: 1936/01/31  Today's TOC FU Call Status: Today's TOC FU Call Status:: Successful TOC FU Call Completed TOC FU Call Complete Date: 05/11/23 Patient's Name and Date of Birth confirmed.  Transition Care Management Follow-up Telephone Call Date of Discharge: 05/10/23 Discharge Facility: [facility]) Type of Discharge: Inpatient Admission Primary Inpatient Discharge Diagnosis:: GI bleed How have you been since you were released from the hospital?: Better Any questions or concerns?: No  Items Reviewed: Did you receive and understand the discharge instructions provided?: Yes Medications obtained,verified, and reconciled?: Yes (Medications Reviewed) Any new allergies since your discharge?: No Dietary orders reviewed?: Yes Do you have support at home?: No  Medications Reviewed Today: Medications Reviewed Today     Reviewed by Karena Addison, LPN (Licensed Practical Nurse) on 05/11/23 at 1444  Med List Status: <None>   Medication Order Taking? Sig Documenting Provider Last Dose Status Informant  denosumab (PROLIA) 60 MG/ML SOSY injection [id number] No Inject 60 mg into the skin every 6 (six) months. [provider] unknown Active Self, Pharmacy Records  diclofenac Sodium (VOLTAREN) 1 % GEL [id number] No Apply 2 g topically 4 (four) times daily.  Patient taking differently: Apply 2 g topically 2 (two) times daily as needed (pain).   Burnadette Pop, MD unknown Active Self, Pharmacy Records  famotidine (PEPCID) 20 MG tablet [id number] No Take 1 tablet (20 mg total) by mouth daily.  Patient taking differently: Take 20 mg by mouth daily before breakfast.   Maretta Bees, MD unknown Expired 05/08/23 2359 Self, Pharmacy Records  feeding supplement, ENSURE ENLIVE, (ENSURE ENLIVE) LIQD [id number] No Take 237 mLs by mouth 2 (two) times daily between meals. Sheikh, Heloise Beecham, DO Past Week Active Self, Pharmacy Records   ferrous sulfate (FEROSUL) 325 (65 FE) MG tablet [id number] No Take 1 tablet (325 mg total) by mouth 2 (two) times daily with a meal. TAKE 2 TABLETS(650 MG) BY MOUTH EVERY OTHER DAY Strength: 325 (65 Fe) mg Zannie Cove, MD 05/07/2023 Active Self, Pharmacy Records  gabapentin (NEURONTIN) 100 MG capsule [id number] No Take 1 capsule (100 mg total) by mouth at bedtime. Anne Ng, NP 05/07/2023 Active Self, Pharmacy Records  lactose free nutrition (BOOST) LIQD [id number] No Take 237 mLs by mouth daily. [provider] Past Week Active Self, Pharmacy Records  loratadine (CLARITIN) 10 MG tablet [id number] No Take 1 tablet (10 mg total) by mouth daily. Linwood Dibbles, MD unknown Active Self, Pharmacy Records  Menthol, Topical Analgesic, (BIOFREEZE) 4 % GEL [id number] No Apply 1 application topically 2 (two) times daily. [provider] unknown Active Self, Pharmacy Records  Multiple Vitamin (MULTIVITAMIN WITH MINERALS) TABS tablet [id number] No Take 1 tablet by mouth daily. Marguerita Merles Vazquez, [place] 05/07/2023 Active Self, Pharmacy Records  polyethylene glycol (MIRALAX / GLYCOLAX) 17 g packet [id number]  Take 17 g by mouth daily. Rolly Salter, MD  Active   Potassium 99 MG TABS [id number] No Take 99 mg by mouth every morning. [provider] 05/07/2023 Active Self, Pharmacy Records            Home Care and Equipment/Supplies: Were Home Health Services Ordered?: NA Any new equipment or medical supplies ordered?: NA  Functional Questionnaire: Do you need assistance with bathing/showering or dressing?: Yes Do you need assistance with meal preparation?: Yes Do you need assistance with eating?: No Do you have difficulty maintaining continence: No Do you need assistance with getting out of bed/getting out of a chair/moving?:  No Do you have difficulty managing or taking your medications?: No  Follow up appointments reviewed: PCP Follow-up appointment confirmed?: No  (declined) Specialist Hospital Follow-up appointment confirmed?: NA Do you need transportation to your follow-up appointment?: No Do you understand care options if your condition(s) worsen?: Yes-patient verbalized understanding    SIGNATURE Karena Addison, LPN [facility] Nurse Health Advisor Direct Dial [phone]

## 2023-05-11 NOTE — Discharge Summary (Signed)
Physician Discharge Summary   Patient: Latoya Bennett MRN: [id number] DOB: December 12, 1935  Admit date:     05/08/2023  Discharge date: 05/10/2023  Discharge Physician: Lynden Oxford  PCP: Salvatore Decent, FNP  Recommendations at discharge: Follow-up with PCP in 1 week.  Recheck CBC   Follow-up Information     Salvatore Decent, FNP. Schedule an appointment as soon as possible for a visit in 2 week(s).   Specialty: Internal Medicine Why: with CBC lab to look at blood counts Contact information: [address] [place] [address] [phone]               Discharge Diagnoses: Principal Problem:   Lower GI bleed Active Problems:   Essential hypertension   History of gout   Hypokalemia   GERD (gastroesophageal reflux disease)   Diverticulosis of colon with hemorrhage   Acute blood loss anemia   Mild protein malnutrition (HCC)   Microcytic anemia   Class 1 obesity  Hospital Course: BRBPR in the setting of lower GI bleed secondary to diverticulosis. At present H&H relatively stable GI recommended conservative management No intervention recommended. H&H remained stable.  Had a BM without any blood. Recommend to avoid NSAIDs on discharge.   Chronic blood loss anemia. I do not think the patient's hemoglobin of 14.2 12/15/2022 is reliable given her prior hemoglobin have been around 10's. But if we believe that, patient's hemoglobin at the time of admission was 10.8. Currently reassuringly her hemoglobin remains around 10. Outpatient follow-up CBC recommended.  HTN. Blood pressure medication on hold Will stop amlodipine on discharge    Gout. Continue to monitor.    Hypokalemia Replaced   GERD Continue PPI.   Obesity Class 1 Body mass index is 30.59 kg/m.  Placing the pt at higher risk of poor outcomes.  Consultants:  Gastroenterology   Procedures performed:  none  DISCHARGE MEDICATION: Allergies as of 05/10/2023   No Known Allergies       Medication List     STOP taking these medications    amLODipine 10 MG tablet Commonly known as: NORVASC   meloxicam 15 MG tablet Commonly known as: MOBIC       TAKE these medications    Biofreeze 4 % Gel Generic drug: Menthol (Topical Analgesic) Apply 1 application topically 2 (two) times daily.   denosumab 60 MG/ML Sosy injection Commonly known as: PROLIA Inject 60 mg into the skin every 6 (six) months.   diclofenac Sodium 1 % Gel Commonly known as: VOLTAREN Apply 2 g topically 4 (four) times daily. What changed:  when to take this reasons to take this   famotidine 20 MG tablet Commonly known as: Pepcid Take 1 tablet (20 mg total) by mouth daily. What changed: when to take this   lactose free nutrition Liqd Take 237 mLs by mouth daily.   feeding supplement Liqd Take 237 mLs by mouth 2 (two) times daily between meals.   ferrous sulfate 325 (65 FE) MG tablet Commonly known as: FeroSul Take 1 tablet (325 mg total) by mouth 2 (two) times daily with a meal. TAKE 2 TABLETS(650 MG) BY MOUTH EVERY OTHER DAY Strength: 325 (65 Fe) mg   gabapentin 100 MG capsule Commonly known as: NEURONTIN Take 1 capsule (100 mg total) by mouth at bedtime.   loratadine 10 MG tablet Commonly known as: CLARITIN Take 1 tablet (10 mg total) by mouth daily.   multivitamin with minerals Tabs tablet Take 1 tablet by mouth daily.   polyethylene glycol 17  g packet Commonly known as: MIRALAX / GLYCOLAX Take 17 g by mouth daily.   Potassium 99 MG Tabs Take 99 mg by mouth every morning.       Disposition: Home Diet recommendation: Cardiac diet  Discharge Exam: Vitals:   05/09/23 1821 05/09/23 2341 05/10/23 0233 05/10/23 0559  BP: (!) 115/52 (!) 114/57 133/61 128/78  Pulse: 85 79 79 83  Resp: 18 18 18 18   Temp: (!) 97.5 F (36.4 C) 98 F (36.7 C) 97.6 F (36.4 C) 98 F (36.7 C)  TempSrc: Oral Oral Oral Oral  SpO2: 100% 98% 96% 100%  Weight:      Height:        General: Appear in no distress; no visible Abnormal Neck Mass Or lumps, Conjunctiva normal Cardiovascular: S1 and S2 Present, GI Murmur, Respiratory: good respiratory effort, Bilateral Air entry present and CTA, no Crackles, no wheezes Abdomen: Bowel Sound present, Non tender  Extremities: no Pedal edema Neurology: alert and oriented to time, place, and person  Filed Weights   05/08/23 0543 05/08/23 1421  Weight: 62 kg 61.9 kg   Condition at discharge: stable  The results of significant diagnostics from this hospitalization (including imaging, microbiology, ancillary and laboratory) are listed below for reference.   Imaging Studies: No results found.  Microbiology: Results for orders placed or performed during the hospital encounter of 05/08/23  MRSA Next Gen by PCR, Nasal     Status: None   Collection Time: 05/08/23  2:25 PM   Specimen: Nasal Mucosa; Nasal Swab  Result Value Ref Range Status   MRSA by PCR Next Gen NOT DETECTED NOT DETECTED Final    Comment: (NOTE) The GeneXpert MRSA Assay (FDA approved for NASAL specimens only), is one component of a comprehensive MRSA colonization surveillance program. It is not intended to diagnose MRSA infection nor to guide or monitor treatment for MRSA infections. Test performance is not FDA approved in patients less than 60 years old. Performed at [facility], [address], [place], [place] [address]    Labs: CBC: Recent Labs  Lab 05/08/23 0615 05/08/23 1007 05/08/23 1622 05/08/23 2240 05/09/23 0304 05/09/23 1626 05/10/23 0659  WBC 5.2  --   --   --  3.1* 3.9* 3.9*  HGB 10.8*   < > 10.0* 9.8* 9.5* 10.0* 9.7*  HCT 31.9*   < > 29.5* 29.0* 26.7* 29.4* 27.6*  MCV 77.1*  --   --   --  76.7* 76.8* 75.0*  PLT 152  --   --   --  111* 146* 138*   < > = values in this interval not displayed.   Basic Metabolic Panel: Recent Labs  Lab 05/08/23 0615 05/09/23 0304  NA 137 138  K 3.4* 3.5  CL 106 109  CO2  26 24  GLUCOSE 121* 100*  BUN 18 12  CREATININE 0.75 0.59  CALCIUM 9.4 9.3  MG 2.2  --   PHOS 2.5  --    Liver Function Tests: Recent Labs  Lab 05/08/23 0615 05/09/23 0304  AST 17 15  ALT 15 14  ALKPHOS 69 58  BILITOT 0.6 0.6  PROT 6.0* 5.1*  ALBUMIN 3.3* 2.9*   CBG: No results for input(s): "GLUCAP" in the last 168 hours.  Discharge time spent: greater than 30 minutes.  Author: Lynden Oxford, MD  [facility] 05/10/2023

## 2023-05-12 ENCOUNTER — Encounter (HOSPITAL_COMMUNITY): Payer: Self-pay

## 2023-05-12 ENCOUNTER — Other Ambulatory Visit: Payer: Self-pay

## 2023-05-12 ENCOUNTER — Observation Stay (HOSPITAL_COMMUNITY)
Admission: EM | Admit: 2023-05-12 | Discharge: 2023-05-14 | Disposition: A | Discharge status: Home / Self Care | Payer: Medicare Other | Attending: Internal Medicine | Admitting: Internal Medicine

## 2023-05-12 DIAGNOSIS — K625 Hemorrhage of anus and rectum: Secondary | ICD-10-CM | POA: Diagnosis present

## 2023-05-12 DIAGNOSIS — R2681 Unsteadiness on feet: Secondary | ICD-10-CM | POA: Insufficient documentation

## 2023-05-12 DIAGNOSIS — E441 Mild protein-calorie malnutrition: Secondary | ICD-10-CM | POA: Diagnosis not present

## 2023-05-12 DIAGNOSIS — I491 Atrial premature depolarization: Secondary | ICD-10-CM | POA: Diagnosis not present

## 2023-05-12 DIAGNOSIS — D62 Acute posthemorrhagic anemia: Secondary | ICD-10-CM | POA: Diagnosis not present

## 2023-05-12 DIAGNOSIS — K921 Melena: Principal | ICD-10-CM

## 2023-05-12 DIAGNOSIS — K5731 Diverticulosis of large intestine without perforation or abscess with bleeding: Secondary | ICD-10-CM | POA: Diagnosis present

## 2023-05-12 DIAGNOSIS — I10 Essential (primary) hypertension: Secondary | ICD-10-CM | POA: Diagnosis not present

## 2023-05-12 DIAGNOSIS — D500 Iron deficiency anemia secondary to blood loss (chronic): Secondary | ICD-10-CM | POA: Diagnosis not present

## 2023-05-12 DIAGNOSIS — E66811 Obesity, class 1: Secondary | ICD-10-CM | POA: Insufficient documentation

## 2023-05-12 DIAGNOSIS — E876 Hypokalemia: Secondary | ICD-10-CM | POA: Diagnosis not present

## 2023-05-12 DIAGNOSIS — K922 Gastrointestinal hemorrhage, unspecified: Principal | ICD-10-CM | POA: Insufficient documentation

## 2023-05-12 DIAGNOSIS — Z8739 Personal history of other diseases of the musculoskeletal system and connective tissue: Secondary | ICD-10-CM

## 2023-05-12 DIAGNOSIS — K219 Gastro-esophageal reflux disease without esophagitis: Secondary | ICD-10-CM | POA: Diagnosis present

## 2023-05-12 LAB — COMPREHENSIVE METABOLIC PANEL
ALT: 12 U/L (ref 0–44)
AST: 15 U/L (ref 15–41)
Albumin: 3.3 g/dL — ABNORMAL LOW (ref 3.5–5.0)
Alkaline Phosphatase: 66 U/L (ref 38–126)
Anion gap: 9 (ref 5–15)
BUN: 35 mg/dL — ABNORMAL HIGH (ref 8–23)
CO2: 24 mmol/L (ref 22–32)
Calcium: 9.9 mg/dL (ref 8.9–10.3)
Chloride: 105 mmol/L (ref 98–111)
Creatinine, Ser: 0.92 mg/dL (ref 0.44–1.00)
GFR, Estimated: >60 mL/min (ref >60)
Glucose, Bld: 130 mg/dL — ABNORMAL HIGH (ref 70–99)
Potassium: 3.3 mmol/L — ABNORMAL LOW (ref 3.5–5.1)
Sodium: 138 mmol/L (ref 135–145)
Total Bilirubin: 0.5 mg/dL (ref <1.2)
Total Protein: 6.1 g/dL — ABNORMAL LOW (ref 6.5–8.1)

## 2023-05-12 LAB — HEMOGLOBIN AND HEMATOCRIT, BLOOD
HCT: 23.2 % — ABNORMAL LOW (ref 36.0–46.0)
HCT: 22.5 % — ABNORMAL LOW (ref 36.0–46.0)
HCT: 22.3 % — ABNORMAL LOW (ref 36.0–46.0)
Hemoglobin: 8 g/dL — ABNORMAL LOW (ref 12.0–15.0)
Hemoglobin: 7.8 g/dL — ABNORMAL LOW (ref 12.0–15.0)
Hemoglobin: 7.7 g/dL — ABNORMAL LOW (ref 12.0–15.0)

## 2023-05-12 LAB — CBC WITH DIFFERENTIAL/PLATELET
Abs Immature Granulocytes: 0.03 10*3/uL (ref 0.00–0.07)
Basophils Absolute: 0 10*3/uL (ref 0.0–0.1)
Basophils Relative: 0 %
Eosinophils Absolute: 0 10*3/uL (ref 0.0–0.5)
Eosinophils Relative: 0 %
HCT: 25.8 % — ABNORMAL LOW (ref 36.0–46.0)
Hemoglobin: 8.6 g/dL — ABNORMAL LOW (ref 12.0–15.0)
Immature Granulocytes: 0 %
Lymphocytes Relative: 12 %
Lymphs Abs: 0.8 10*3/uL (ref 0.7–4.0)
MCH: 25.8 pg — ABNORMAL LOW (ref 26.0–34.0)
MCHC: 33.3 g/dL (ref 30.0–36.0)
MCV: 77.5 fL — ABNORMAL LOW (ref 80.0–100.0)
Monocytes Absolute: 0.3 10*3/uL (ref 0.1–1.0)
Monocytes Relative: 5 %
Neutro Abs: 5.6 10*3/uL (ref 1.7–7.7)
Neutrophils Relative %: 83 %
Platelets: 163 10*3/uL (ref 150–400)
RBC: 3.33 MIL/uL — ABNORMAL LOW (ref 3.87–5.11)
RDW: 14.9 % (ref 11.5–15.5)
WBC: 6.8 10*3/uL (ref 4.0–10.5)
nRBC: 0 % (ref 0.0–0.2)

## 2023-05-12 LAB — MAGNESIUM: Magnesium: 2 mg/dL (ref 1.7–2.4)

## 2023-05-12 LAB — TYPE AND SCREEN
ABO/RH(D): O POS
Antibody Screen: NEGATIVE

## 2023-05-12 LAB — PHOSPHORUS: Phosphorus: 2.8 mg/dL (ref 2.5–4.6)

## 2023-05-12 LAB — MRSA NEXT GEN BY PCR, NASAL: MRSA by PCR Next Gen: NOT DETECTED

## 2023-05-12 MED ORDER — ONDANSETRON HCL 4 MG PO TABS: 4.0000 mg | ORAL_TABLET | Freq: Four times a day (QID) | ORAL | Status: DC | PRN

## 2023-05-12 MED ORDER — ORAL CARE MOUTH RINSE: 15.0000 mL | OROMUCOSAL | Status: DC | PRN

## 2023-05-12 MED ORDER — PANTOPRAZOLE SODIUM 40 MG IV SOLR
40.0000 mg | Freq: Two times a day (BID) | INTRAVENOUS | Status: DC
  Administered 2023-05-12 – 2023-05-14 (×5): 40 mg via INTRAVENOUS
  Filled 2023-05-12 (×5): qty 10

## 2023-05-12 MED ORDER — CHLORHEXIDINE GLUCONATE CLOTH 2 % EX PADS
6.0000 | MEDICATED_PAD | Freq: Every day | CUTANEOUS | Status: DC
  Administered 2023-05-12 – 2023-05-13 (×2): 6 via TOPICAL

## 2023-05-12 MED ORDER — ACETAMINOPHEN 325 MG PO TABS
650.0000 mg | ORAL_TABLET | Freq: Four times a day (QID) | ORAL | Status: DC | PRN
  Administered 2023-05-13: 650 mg via ORAL
  Filled 2023-05-12 (×2): qty 2

## 2023-05-12 MED ORDER — ACETAMINOPHEN 650 MG RE SUPP: 650.0000 mg | Freq: Four times a day (QID) | RECTAL | Status: DC | PRN

## 2023-05-12 MED ORDER — ONDANSETRON HCL 4 MG/2ML IJ SOLN: 4.0000 mg | Freq: Four times a day (QID) | INTRAMUSCULAR | Status: DC | PRN

## 2023-05-12 MED ORDER — POTASSIUM CHLORIDE CRYS ER 20 MEQ PO TBCR
40.0000 meq | EXTENDED_RELEASE_TABLET | Freq: Once | ORAL | Status: AC
  Administered 2023-05-12: 40 meq via ORAL
  Filled 2023-05-12: qty 2

## 2023-05-12 NOTE — ED Notes (Signed)
ED TO INPATIENT HANDOFF REPORT  Name/Age/Gender [facility] 87 y.o. female  Code Status    Code Status Orders  (From admission, onward)           Start     Ordered   05/12/23 0747  Full code  Continuous       Question:  By:  Answer:  Consent: discussion documented in EHR   05/12/23 0746           Code Status History     Date Active Date Inactive Code Status Order ID Comments User Context   05/08/2023 0916 05/10/2023 1748 Full Code [id number]  Bobette Mo, MD ED   06/08/2021 1352 06/14/2021 1604 Full Code [id number]  Emeline General, MD ED   06/03/2021 0019 06/05/2021 1921 Full Code [id number]  Charlsie Quest, MD ED   04/26/2020 1919 04/30/2020 1602 Full Code [id number]  Teddy Spike, DO ED   02/12/2019 0344 02/17/2019 1708 Full Code [id number]  Lorretta Harp, MD Inpatient   04/17/2018 1358 04/20/2018 2154 Full Code [id number]  Zigmund Daniel., MD Inpatient       Home/SNF/Other Home  Chief Complaint Lower GI bleed [K92.2]  Level of Care/Admitting Diagnosis ED Disposition     ED Disposition  Admit   Condition  --   Comment  Hospital Area: [facility] [[id number]]  Level of Care: Stepdown [14]  Admit to SDU based on following criteria: Hemodynamic compromise or significant risk of instability:  Patient requiring short term acute titration and management of vasoactive drips, and invasive monitoring (i.e., CVP and Arterial line).  May place patient in observation at [facility] or [facility] if equivalent level of care is available:: No  Covid Evaluation: Asymptomatic - no recent exposure (last 10 days) testing not required  Diagnosis: Lower GI bleed [518841]  Admitting Physician: Bobette Mo [[id number]]  Attending Physician: Bobette Mo [[id number]]          Medical History Past Medical History:  Diagnosis Date   Acute GI bleeding 06/02/2021   Acute posthemorrhagic anemia    Arthritis    Diverticulitis    GERD  (gastroesophageal reflux disease)    GI bleed    GIB (gastrointestinal bleeding) 02/12/2019   Hypertension    Lower GI bleed 04/17/2018    Allergies No Known Allergies  IV Location/Drains/Wounds Patient Lines/Drains/Airways Status     Active Line/Drains/Airways     Name Placement date Placement time Site Days   Peripheral IV 05/12/23 20 G 1.16" Right Antecubital 05/12/23  0655  Antecubital  less than 1   Peripheral IV 05/12/23 20 G Left Antecubital 05/12/23  1057  Antecubital  less than 1            Labs/Imaging Results for orders placed or performed during the hospital encounter of 05/12/23 (from the past 48 hour(s))  CBC with Differential     Status: Abnormal   Collection Time: 05/12/23  6:37 AM  Result Value Ref Range   WBC 6.8 4.0 - 10.5 K/uL   RBC 3.33 (L) 3.87 - 5.11 MIL/uL   Hemoglobin 8.6 (L) 12.0 - 15.0 g/dL    Comment: Reticulocyte Hemoglobin testing may be clinically indicated, consider ordering this additional test [id number]    HCT 25.8 (L) 36.0 - 46.0 %   MCV 77.5 (L) 80.0 - 100.0 fL   MCH 25.8 (L) 26.0 - 34.0 pg   MCHC 33.3 30.0 - 36.0 g/dL   RDW 25.4 27.0 -  15.5 %   Platelets 163 150 - 400 K/uL   nRBC 0.0 0.0 - 0.2 %   Neutrophils Relative % 83 %   Neutro Abs 5.6 1.7 - 7.7 K/uL   Lymphocytes Relative 12 %   Lymphs Abs 0.8 0.7 - 4.0 K/uL   Monocytes Relative 5 %   Monocytes Absolute 0.3 0.1 - 1.0 K/uL   Eosinophils Relative 0 %   Eosinophils Absolute 0.0 0.0 - 0.5 K/uL   Basophils Relative 0 %   Basophils Absolute 0.0 0.0 - 0.1 K/uL   Immature Granulocytes 0 %   Abs Immature Granulocytes 0.03 0.00 - 0.07 K/uL    Comment: Performed at [facility], [address], [place], [place] [address]  Comprehensive metabolic panel     Status: Abnormal   Collection Time: 05/12/23  6:37 AM  Result Value Ref Range   Sodium 138 135 - 145 mmol/L   Potassium 3.3 (L) 3.5 - 5.1 mmol/L   Chloride 105 98 - 111 mmol/L   CO2 24 22 - 32 mmol/L    Glucose, Bld 130 (H) 70 - 99 mg/dL    Comment: Glucose reference range applies only to samples taken after fasting for at least 8 hours.   BUN 35 (H) 8 - 23 mg/dL   Creatinine, Ser 6.04 0.44 - 1.00 mg/dL   Calcium 9.9 8.9 - 54.0 mg/dL   Total Protein 6.1 (L) 6.5 - 8.1 g/dL   Albumin 3.3 (L) 3.5 - 5.0 g/dL   AST 15 15 - 41 U/L   ALT 12 0 - 44 U/L   Alkaline Phosphatase 66 38 - 126 U/L   Total Bilirubin 0.5 <1.2 mg/dL   GFR, Estimated >98 >11 mL/min    Comment: (NOTE) Calculated using the CKD-EPI Creatinine Equation (2021)    Anion gap 9 5 - 15    Comment: Performed at [facility], [address], [place], [place] [address]  Type and screen [facility]     Status: None   Collection Time: 05/12/23  6:37 AM  Result Value Ref Range   ABO/RH(D) O POS    Antibody Screen NEG    Sample Expiration      05/15/2023,2359 Performed at [facility], [address] [place], [place] [address]   Magnesium     Status: None   Collection Time: 05/12/23  6:37 AM  Result Value Ref Range   Magnesium 2.0 1.7 - 2.4 mg/dL    Comment: Performed at [facility], [address] [place], [place] [address]  Phosphorus     Status: None   Collection Time: 05/12/23  6:37 AM  Result Value Ref Range   Phosphorus 2.8 2.5 - 4.6 mg/dL    Comment: Performed at [facility], [address] [place], [place] [address]  Hemoglobin and hematocrit, blood     Status: Abnormal   Collection Time: 05/12/23 11:05 AM  Result Value Ref Range   Hemoglobin 8.0 (L) 12.0 - 15.0 g/dL   HCT 96.2 (L) 95.2 - 84.1 %    Comment: Performed at [facility], [address], [place], [place] [address]   No results found.  Pending Labs Unresulted Labs (From admission, onward)     Start     Ordered   05/13/23 0500  CBC  Tomorrow morning,   R        05/12/23 0746   05/13/23 0500  Comprehensive metabolic panel  Tomorrow morning,   R        05/12/23 0746   05/12/23 1100  Hemoglobin and hematocrit, blood  Every 6 hours,   R (with TIMED occurrences)      05/12/23 0745            Vitals/Pain Today's Vitals   05/12/23 1549 05/12/23 1630 05/12/23 1651 05/12/23 1700  BP:  (!) 107/53  (!) 119/59  Pulse: 84 86 86 83  Resp: 20 (!) 23 20 (!) 22  Temp:      TempSrc:      SpO2: 97% 94% 100% 100%  Weight:      Height:      PainSc:        Isolation Precautions No active isolations  Medications Medications  acetaminophen (TYLENOL) tablet 650 mg (has no administration in time range)    Or  acetaminophen (TYLENOL) suppository 650 mg (has no administration in time range)  ondansetron (ZOFRAN) tablet 4 mg (has no administration in time range)    Or  ondansetron (ZOFRAN) injection 4 mg (has no administration in time range)  pantoprazole (PROTONIX) injection 40 mg (40 mg Intravenous Given 05/12/23 0919)  potassium chloride SA (KLOR-CON M) CR tablet 40 mEq (40 mEq Oral Given 05/12/23 0809)    Mobility walks with device

## 2023-05-12 NOTE — Consult Note (Addendum)
Referring Provider: Dr. Bobette Mo  Primary Care Physician:  Salvatore Decent, FNP Primary Gastroenterologist:  Dr. Barron Alvine   Reason for Consultation: Recurrent painless hematochezia  HPI: Latoya Bennett is a 87 y.o. female female with a past medical history arthritis, hypertension, pancreatic duct dilatation with tiny cystic foci in the tail of the pancreas per MRI, GERD, colon polyps, diverticulosis and recurrent diverticular bleeds.   She was admitted to the hospital 05/08/2023 with painless hematochezia, described as bright red and dark red blood suspected diverticular bleed.  Admission hemoglobin 10.8. At that time, she endorsed taking Meloxicam 15 mg daily for generalized arthritis pain since 10/2021. She ran out of Meloxicam the day prior to admission therefore she took ASA 325mg  x 1.  CTA was not done as she did not demonstrate any active GI bleeding and her Hg level stabilized at 10 -> 9.7 and she was discharged home 05/10/2023.  She presented back to the ED this morning with recurrent painless hematochezia. She passed a large amount of dark red blood with clots per there rectum around 2 AM without passing any stool. Hemoglobin 8.6 down from 9.7 on 11/3.  MCV 77.  K+ 3.3.  BUN 35 up from 12 on 11/2. Creatinine 0.92. Normal LFTs. Rectal exam by the ED physician identified a gross amount of dark maroon blood. She denies having any N/V. No abdominal pain. No GERD symptoms. No rectal pain. No further NSAIDs since she was discharged home on 11/3. No CP or SOB. She is taking Ferrous Sulfate 325mg  po bid at home. She is not taking Famotidine. Her caretaker, Mr. Andrey Campanile was at the bedside, he left as I arrived.   SUMMARY OF PAST HOSPITALIZATIONS WITH GI  BLEED:   She has a history of recurrent GI bleeding, presumably diverticular bleeds. She was admitted to the hospital 10/12 - 04/20/2018 with bright red hematochezia. Tagged red blood cell scan 04/17/2018 was positive for active GI  bleeding likely originating in the distal stomach or proximal duodenum.  An EGD was done showed gastritis without evidence of active bleeding.  Diverticular bleed was suspected.  Her H&H remained stable after transfused with 2 units of PRBCs without further hematochezia and she was discharged home.   Admitted to the hospital 02/12/2019 with hematochezia.  Hemoglobin 8.7.  Transfused 1 unit PRBCs.  She underwent a colonoscopy during this hospitalization which showed diverticulosis in the entire examined colon, blood was present in the distal descending, sigmoid, rectosigmoid and rectum and two 2 to 5 mm polyps were seen in the transverse and ascending colon but were not resected in setting of active GI bleed. Hemoglobin level stabilized and she did not demonstrate any active bleeding post colonoscopy and was subsequently discharged home.   Admitted to the hospital  in 04/2020 with hematochezia. CT angio at that time showed focal hemorrhage in the distal transverse colon but then no active bleeding on subsequent angiography by IR.    She was admitted to the hospital 11/27 - 06/05/2021 with left-sided abdominal pain with hematochezia. Admission hemoglobin was 9.8 with a baseline hemoglobin of 14. CTAP showed diverticulosis with possible mild acute diverticulitis in the distal descending/proximal sigmoid (vs underdistention). Persistent calcified pancreatic stone. Cholelithiasis without cholecystitis or duct dilation.CT angio showed patent vasculature without extravasation, diverticulosis, moderate thickening/inflammation of the mid to distal descending colon and cholelithiasis without cholecystitis. She was treated conservatively with IV antibiotics with consideration of repeat colonoscopy if she developed recurrent bleeding.  She did not demonstrate any further active  GI bleeding and was discharged home without requiring oral antibiotics. Hg level was 8.4 at discharge.   She was readmitted to the hospital  06/08/2021 with recurrent dark red hematochezia.  Admission hemoglobin 7.4.  Received 3 units of PRBCs throughout that admission and IV iron. CTA was negative for active GI bleeding.  She underwent a colonoscopy 06/12/2021 which showed diverticulosis throughout the colon without identifying etiology for recurrent lower GI bleed.  Her hemoglobin stabilized without further hematochezia therefore she was discharged home.   GI PROCEDURES:   Colonoscopy 06/12/2021 as an inpatient by Dr. Myrtie Neither: - Preparation of the colon was fair.  - Decreased sphincter tone found on digital rectal exam.  - The examined portion of the ileum was normal.  - Diverticulosis in the entire examined colon.  - The examination was otherwise normal on direct and retroflexion views.  - No specimens collected. Unable to localize culprit diverticulum by imaging or colonoscopy for this episode of bleeding   Colonoscopy 02/15/2019: - Diverticulosis in the entire examined colon.  - Blood in the rectum, in the recto-sigmoid colon, in the sigmoid colon and in the distal descending colon.  - The distal rectum and anal verge are normal on retroflexion view.  - Two 2 to 5 mm polyps in the transverse colon and in the ascending colon. The polyps were 2 to 5 mm in size. Given the obscure GI bleed, these were not resected so to not complicate the course with a possible post polypectomy ble - The examined portion of the ileum was normal.  - No specimens collected.   EGD 04/18/2018:  - Small hiatal hernia.  - Gastritis.  - Normal duodenal bulb, first portion of the duodenum and second portion of the duodenum.  - No source of bleeding (current or recent or remote) in the upper GI tract.    Colonoscopy 09/15/2005: Diverticulosis in the ascending to sigmoid colon, recent reddish blood in the colon but no blood in the terminal ileum    Past Medical History:  Diagnosis Date   Acute GI bleeding 06/02/2021   Acute posthemorrhagic anemia     Arthritis    Diverticulitis    GERD (gastroesophageal reflux disease)    GI bleed    GIB (gastrointestinal bleeding) 02/12/2019   Hypertension    Lower GI bleed 04/17/2018    Past Surgical History:  Procedure Laterality Date   ABDOMINAL HYSTERECTOMY     COLONOSCOPY Left 02/15/2019   Procedure: COLONOSCOPY;  Surgeon: Shellia Cleverly, DO;  Location: [facility] ENDOSCOPY;  Service: Gastroenterology;  Laterality: Left;   COLONOSCOPY WITH PROPOFOL N/A 06/12/2021   Procedure: COLONOSCOPY WITH PROPOFOL;  Surgeon: Sherrilyn Rist, MD;  Location: [facility] ENDOSCOPY;  Service: Gastroenterology;  Laterality: N/A;   ESOPHAGOGASTRODUODENOSCOPY (EGD) WITH PROPOFOL Left 04/18/2018   Procedure: ESOPHAGOGASTRODUODENOSCOPY (EGD) WITH PROPOFOL;  Surgeon: Willis Modena, MD;  Location: [facility] ENDOSCOPY;  Service: Endoscopy;  Laterality: Left;   IR ANGIOGRAM SELECTIVE EACH ADDITIONAL VESSEL  04/27/2020   IR ANGIOGRAM SELECTIVE EACH ADDITIONAL VESSEL  04/27/2020   IR ANGIOGRAM SELECTIVE EACH ADDITIONAL VESSEL  04/27/2020   IR ANGIOGRAM VISCERAL SELECTIVE  04/26/2020   IR ANGIOGRAM VISCERAL SELECTIVE  04/26/2020   IR ANGIOGRAM VISCERAL SELECTIVE  04/27/2020   IR US GUIDE VASC ACCESS RIGHT  04/26/2020   IR US GUIDE VASC ACCESS RIGHT  04/27/2020    Prior to Admission medications   Medication Sig Start Date End Date Taking? Authorizing Provider  denosumab (PROLIA) 60 MG/ML SOSY injection Inject 60  mg into the skin every 6 (six) months. 09/16/22   [provider]  diclofenac Sodium (VOLTAREN) 1 % GEL Apply 2 g topically 4 (four) times daily. Patient taking differently: Apply 2 g topically 2 (two) times daily as needed (pain). 04/30/20   Burnadette Pop, MD  famotidine (PEPCID) 20 MG tablet Take 1 tablet (20 mg total) by mouth daily. Patient taking differently: Take 20 mg by mouth daily before breakfast. 06/05/21 05/08/23  Ghimire, Werner Lean, MD  feeding supplement, ENSURE ENLIVE, (ENSURE ENLIVE) LIQD Take 237 mLs  by mouth 2 (two) times daily between meals. 02/17/19   [name], DO  ferrous sulfate (FEROSUL) 325 (65 FE) MG tablet Take 1 tablet (325 mg total) by mouth 2 (two) times daily with a meal. TAKE 2 TABLETS(650 MG) BY MOUTH EVERY OTHER DAY Strength: 325 (65 Fe) mg 06/14/21   Zannie Cove, MD  gabapentin (NEURONTIN) 100 MG capsule Take 1 capsule (100 mg total) by mouth at bedtime. 04/13/23   Nche, Bonna Gains, NP  lactose free nutrition (BOOST) LIQD Take 237 mLs by mouth daily.    [provider]  loratadine (CLARITIN) 10 MG tablet Take 1 tablet (10 mg total) by mouth daily. 12/02/22   Linwood Dibbles, MD  Menthol, Topical Analgesic, (BIOFREEZE) 4 % GEL Apply 1 application topically 2 (two) times daily.    [provider]  Multiple Vitamin (MULTIVITAMIN WITH MINERALS) TABS tablet Take 1 tablet by mouth daily. 02/18/19   Sheikh, Kateri Mc Latif, DO  polyethylene glycol (MIRALAX / GLYCOLAX) 17 g packet Take 17 g by mouth daily. 05/11/23   Rolly Salter, MD  Potassium 99 MG TABS Take 99 mg by mouth every morning.    [provider]    Current Facility-Administered Medications  Medication Dose Route Frequency Provider Last Rate Last Admin   acetaminophen (TYLENOL) tablet 650 mg  650 mg Oral Q6H PRN Bobette Mo, MD       Or   acetaminophen (TYLENOL) suppository 650 mg  650 mg Rectal Q6H PRN Bobette Mo, MD       ondansetron [facility]) tablet 4 mg  4 mg Oral Q6H PRN Bobette Mo, MD       Or   ondansetron [facility]) injection 4 mg  4 mg Intravenous Q6H PRN Bobette Mo, MD       potassium chloride SA (KLOR-CON M) CR tablet 40 mEq  40 mEq Oral Once Bobette Mo, MD       Current Outpatient Medications  Medication Sig Dispense Refill   denosumab (PROLIA) 60 MG/ML SOSY injection Inject 60 mg into the skin every 6 (six) months.     diclofenac Sodium (VOLTAREN) 1 % GEL Apply 2 g topically 4 (four) times daily. (Patient taking differently: Apply  2 g topically 2 (two) times daily as needed (pain).) 150 g 2   famotidine (PEPCID) 20 MG tablet Take 1 tablet (20 mg total) by mouth daily. (Patient taking differently: Take 20 mg by mouth daily before breakfast.) 30 tablet 0   feeding supplement, ENSURE ENLIVE, (ENSURE ENLIVE) LIQD Take 237 mLs by mouth 2 (two) times daily between meals. 237 mL 12   ferrous sulfate (FEROSUL) 325 (65 FE) MG tablet Take 1 tablet (325 mg total) by mouth 2 (two) times daily with a meal. TAKE 2 TABLETS(650 MG) BY MOUTH EVERY OTHER DAY Strength: 325 (65 Fe) mg 90 tablet 1   gabapentin (NEURONTIN) 100 MG capsule Take 1 capsule (100 mg total)  by mouth at bedtime. 30 capsule 5   lactose free nutrition (BOOST) LIQD Take 237 mLs by mouth daily.     loratadine (CLARITIN) 10 MG tablet Take 1 tablet (10 mg total) by mouth daily. 90 tablet 0   Menthol, Topical Analgesic, (BIOFREEZE) 4 % GEL Apply 1 application topically 2 (two) times daily.     Multiple Vitamin (MULTIVITAMIN WITH MINERALS) TABS tablet Take 1 tablet by mouth daily. 30 tablet 0   polyethylene glycol (MIRALAX / GLYCOLAX) 17 g packet Take 17 g by mouth daily. 14 each 0   Potassium 99 MG TABS Take 99 mg by mouth every morning.      Allergies as of 05/12/2023   (No Known Allergies)    Family History  Problem Relation Age of Onset   Hypertension Sister    Colon cancer Neg Hx    Esophageal cancer Neg Hx     Social History   Socioeconomic History   Marital status: Single    Spouse name: Not on file   Number of children: Not on file   Years of education: Not on file   Highest education level: Not on file  Occupational History   Occupation: retired  Tobacco Use   Smoking status: Never   Smokeless tobacco: Never  Vaping Use   Vaping status: Never Used  Substance and Sexual Activity   Alcohol use: Not Currently    Alcohol/week: 0.0 standard drinks of alcohol   Drug use: Never   Sexual activity: Not on file  Other Topics Concern   Not on file   Social History Narrative   Marital status: single; not dating in 2018; moved from [place] to be near sister; not happy in [place].  Lived [place] for 50 years.      Children: no children      Lives: alone with cat      Employment: retired; Architectural technologist with computers      Tobacco:       Alcohol:       ADLs; independent with ADLs; drives   Social Determinants of Health   Financial Resource Strain: Low Risk  (09/09/2022)   Received from [facility], [facility]   Overall Financial Resource Strain (CARDIA)    Difficulty of Paying Living Expenses: Not very hard  Food Insecurity: No Food Insecurity (09/09/2022)   Received from [facility], [facility]   Hunger Vital Sign    Worried About Running Out of Food in the Last Year: Never true    Ran Out of Food in the Last Year: Never true  Transportation Needs: No Transportation Needs (09/09/2022)   Received from [facility], [facility]   PRAPARE - Transportation    Lack of Transportation (Medical): No    Lack of Transportation (Non-Medical): No  Physical Activity: Sufficiently Active (04/01/2021)   Received from [facility], [facility]   Exercise Vital Sign    Days of Exercise per Week: 7 days    Minutes of Exercise per Session: 30 min  Stress: Stress Concern Present (04/01/2021)   Received from [facility], [facility] of Occupational Health - Occupational Stress Questionnaire    Feeling of Stress : To some extent  Social Connections: Unknown (11/03/2021)   Received from [facility], [facility]   Social Network    Social Network: Not on file  Intimate Partner Violence: Unknown (10/07/2021)   Received from [facility], [facility]   HITS    Physically Hurt: Not  on file    Insult or Talk Down To: Not on file    Threaten Physical Harm: Not on file    Scream or Curse: Not on file    Review of Systems: Gen: Denies fever, sweats or chills. No weight loss.  CV: Denies chest pain,  palpitations or edema. Resp: Denies cough, shortness of breath of hemoptysis.  GI: See HPI.  GU : Denies urinary burning, blood in urine, increased urinary frequency or incontinence. MS: Denies joint pain, muscles aches or weakness. Derm: Denies rash, itchiness, skin lesions or unhealing ulcers. Psych: Denies depression, anxiety, memory loss or confusion. Heme: Denies easy bruising, bleeding. Neuro:  Denies headaches, dizziness or paresthesias. Endo:  Denies any problems with DM, thyroid or adrenal function.  Physical Exam: Vital signs in last 24 hours: Temp:  [97.6 F (36.4 C)] 97.6 F (36.4 C) (11/05 0602) Pulse Rate:  [76] 76 (11/05 0602) Resp:  [16-18] 18 (11/05 0602) BP: (116-128)/(63-73) 116/63 (11/05 0602) SpO2:  [96 %-100 %] 100 % (11/05 0602) Weight:  [61.9 kg] 61.9 kg (11/05 0559)   General: 87 year old female in no acute distress. Head:  Normocephalic and atraumatic. Eyes:  No scleral icterus. Conjunctiva pink. Ears:  Normal auditory acuity. Nose:  No deformity, discharge or lesions. Mouth:  Absent dentition. No ulcers or lesions.  Neck:  Supple. No lymphadenopathy or thyromegaly.  Lungs: Breath sounds clear throughout. No wheezes, rhonchi or crackles.  Heart:  Irregular rhythm, no murmur. Abdomen: Soft, nontender. No mass. No hepatosplenomegaly. No bruit.  Rectal: Very dark maroon blood in the rectal vault, black dry blood on diaper. Musculoskeletal:  Symmetrical without gross deformities.  Pulses:  Normal pulses noted. Extremities:  Without clubbing or edema. Neurologic:  Alert and  oriented x 4. No focal deficits.  Skin:  Intact without significant lesions or rashes. Psych:  Alert and cooperative. Normal mood and affect.  Intake/Output from previous day: No intake/output data recorded. Intake/Output this shift: No intake/output data recorded.  Lab Results: Recent Labs    05/09/23 1626 05/10/23 0659 05/12/23 0637  WBC 3.9* 3.9* 6.8  HGB 10.0* 9.7* 8.6*   HCT 29.4* 27.6* 25.8*  PLT 146* 138* 163   BMET Recent Labs    05/12/23 0637  NA 138  K 3.3*  CL 105  CO2 24  GLUCOSE 130*  BUN 35*  CREATININE 0.92  CALCIUM 9.9   LFT Recent Labs    05/12/23 0637  PROT 6.1*  ALBUMIN 3.3*  AST 15  ALT 12  ALKPHOS 66  BILITOT 0.5   PT/INR No results for input(s): "LABPROT", "INR" in the last 72 hours. Hepatitis Panel No results for input(s): "HEPBSAG", "HCVAB", "HEPAIGM", "HEPBIGM" in the last 72 hours.    Studies/Results: No results found.  IMPRESSION/PLAN:  87 year old female with a history of recurrent diverticular bleed presenting with recurrent painless hematochezia. Hg 8.6 down from 9.7. Elevated BUN 35 concerning for brisk upper GI bleed. + NSAID use. Recently admitted 11/1 - 11/3 with the same symptoms.  Hemodynamically stable at this time. -NPO -PPI IV bid  -?CTA -? EGD assess for brisk upper GI bleed with elevated BUN. Colonoscopy if EGD unrevealing. Await further recommendations per Dr. Marina Goodell. -Check H/H Q 6 hours x 24 hours -Transfuse for Hg < 8 -IV fluids per the hospitalist   History of GERD. Chronic NSAID use.  -PPI IV bid -See plan above    History of two 2 to 5 mm polyps in the transverse and ascending colon seen per  colonoscopy 02/2019 which were not removed in the setting of active GI bleeding.  Colonoscopy 06/2021 done due to recurrent hematochezia did not identify any colon polyps.  Hypokalemia. K+ 3.3. -KCL replacement per the hospitalist  -Recommend telemetry monitor    Arnaldo Natal  05/12/2023, 9:03AM  GI ATTENDING  History, laboratories, x-rays, prior endoscopy reports reviewed.  Agree with comprehensive consultation note as outlined above. Patient has recurrent GI bleeding most likely due to diverticular bleed.  Has had recurrent diverticular bleeds in the past.  Low index of suspicion for upper GI bleed.  Vital signs stable.  Agree with monitoring blood counts and transfusing for  hemoglobin less than 8.  If significant bleeding, would proceed with urgent CTA and if positive subsequent IR embolization.  Will follow.  Wilhemina Bonito. Eda Keys., M.D. [facility] Division of Gastroenterology

## 2023-05-12 NOTE — H&P (Signed)
History and Physical    Patient: Latoya Bennett WGN:[id number] DOB: 1935-07-27 DOA: 05/12/2023 DOS: the patient was seen and examined on 05/12/2023 PCP: Latoya Decent, FNP  Patient coming from: Home  Chief Complaint:  Chief Complaint  Patient presents with   Rectal Bleeding   HPI: Latoya Bennett is a 87 y.o. female with medical history significant of hypertension, PACs, history of tachycardia, class I obesity, chronic pain of multiple joints, gout, GERD, diverticulosis, diverticulitis, history of lower GI bleed and ABLA who presented on 05/08/2023 to the emergency department complaints of having 2 large dark red blood bowel movements associated with mild lightheadedness with her hemoglobin decreasing from her baseline of 13- 14 to 10.8 g/dL.  She was discharged 2 days ago, but is returning to the emergency department with complaints of more rectal bleeding and melanotic stools.  She uses NSAIDs at home.  She denied fever, chills, rhinorrhea, sore throat, wheezing or hemoptysis.  No dyspnea, chest pain, palpitations, diaphoresis, PND, orthopnea or pitting edema of the lower extremities.  No abdominal pain, nausea, emesis, diarrhea or constipation.  No flank pain, dysuria, frequency or hematuria.  No polyuria, polydipsia, polyphagia or blurred vision.   Lab work: CBC showed a white count of 6.8, hemoglobin 8.6 g/dL with an MCV of 78.4 fL and platelets 163.  CMP showed a potassium of 3.3 mmol/L, the rest of the electrolytes were normal.  Glucose 130, BUN 35 and creatinine 0.92 mg/dL.  Total protein 6.1 and albumin 3.3 g/dL.  The rest of the LFTs were normal.  Unremarkable magnesium and phosphorus level.   ED course: Initial vital signs were temperature 97.6 F, pulse 76, respirations 16, BP 128/73 mmHg O2 sat 96% on room air.  Review of Systems: As mentioned in the history of present illness. All other systems reviewed and are negative.  Past Medical History:  Diagnosis Date   Acute  GI bleeding 06/02/2021   Acute posthemorrhagic anemia    Arthritis    Diverticulitis    GERD (gastroesophageal reflux disease)    GI bleed    GIB (gastrointestinal bleeding) 02/12/2019   Hypertension    Lower GI bleed 04/17/2018   Past Surgical History:  Procedure Laterality Date   ABDOMINAL HYSTERECTOMY     COLONOSCOPY Left 02/15/2019   Procedure: COLONOSCOPY;  Surgeon: Shellia Cleverly, DO;  Location: [facility] ENDOSCOPY;  Service: Gastroenterology;  Laterality: Left;   COLONOSCOPY WITH PROPOFOL N/A 06/12/2021   Procedure: COLONOSCOPY WITH PROPOFOL;  Surgeon: Sherrilyn Rist, MD;  Location: [facility] ENDOSCOPY;  Service: Gastroenterology;  Laterality: N/A;   ESOPHAGOGASTRODUODENOSCOPY (EGD) WITH PROPOFOL Left 04/18/2018   Procedure: ESOPHAGOGASTRODUODENOSCOPY (EGD) WITH PROPOFOL;  Surgeon: Willis Modena, MD;  Location: [facility] ENDOSCOPY;  Service: Endoscopy;  Laterality: Left;   IR ANGIOGRAM SELECTIVE EACH ADDITIONAL VESSEL  04/27/2020   IR ANGIOGRAM SELECTIVE EACH ADDITIONAL VESSEL  04/27/2020   IR ANGIOGRAM SELECTIVE EACH ADDITIONAL VESSEL  04/27/2020   IR ANGIOGRAM VISCERAL SELECTIVE  04/26/2020   IR ANGIOGRAM VISCERAL SELECTIVE  04/26/2020   IR ANGIOGRAM VISCERAL SELECTIVE  04/27/2020   IR US GUIDE VASC ACCESS RIGHT  04/26/2020   IR US GUIDE VASC ACCESS RIGHT  04/27/2020   Social History:  reports that she has never smoked. She has never used smokeless tobacco. She reports that she does not currently use alcohol. She reports that she does not use drugs.  No Known Allergies  Family History  Problem Relation Age of Onset   Hypertension Sister    Colon cancer Neg  Hx    Esophageal cancer Neg Hx     Prior to Admission medications   Medication Sig Start Date End Date Taking? Authorizing Provider  denosumab (PROLIA) 60 MG/ML SOSY injection Inject 60 mg into the skin every 6 (six) months. 09/16/22   [provider]  diclofenac Sodium (VOLTAREN) 1 % GEL Apply 2 g topically 4 (four)  times daily. Patient taking differently: Apply 2 g topically 2 (two) times daily as needed (pain). 04/30/20   Burnadette Pop, MD  famotidine (PEPCID) 20 MG tablet Take 1 tablet (20 mg total) by mouth daily. Patient taking differently: Take 20 mg by mouth daily before breakfast. 06/05/21 05/08/23  Ghimire, Werner Lean, MD  feeding supplement, ENSURE ENLIVE, (ENSURE ENLIVE) LIQD Take 237 mLs by mouth 2 (two) times daily between meals. 02/17/19   [name], DO  ferrous sulfate (FEROSUL) 325 (65 FE) MG tablet Take 1 tablet (325 mg total) by mouth 2 (two) times daily with a meal. TAKE 2 TABLETS(650 MG) BY MOUTH EVERY OTHER DAY Strength: 325 (65 Fe) mg 06/14/21   Zannie Cove, MD  gabapentin (NEURONTIN) 100 MG capsule Take 1 capsule (100 mg total) by mouth at bedtime. 04/13/23   Nche, Bonna Gains, NP  lactose free nutrition (BOOST) LIQD Take 237 mLs by mouth daily.    [provider]  loratadine (CLARITIN) 10 MG tablet Take 1 tablet (10 mg total) by mouth daily. 12/02/22   Linwood Dibbles, MD  Menthol, Topical Analgesic, (BIOFREEZE) 4 % GEL Apply 1 application topically 2 (two) times daily.    [provider]  Multiple Vitamin (MULTIVITAMIN WITH MINERALS) TABS tablet Take 1 tablet by mouth daily. 02/18/19   Sheikh, Kateri Mc Latif, DO  polyethylene glycol (MIRALAX / GLYCOLAX) 17 g packet Take 17 g by mouth daily. 05/11/23   Rolly Salter, MD  Potassium 99 MG TABS Take 99 mg by mouth every morning.    [provider]    Physical Exam: Vitals:   05/12/23 0559 05/12/23 0600 05/12/23 0602  BP:  128/73 116/63  Pulse:  76 76  Resp:  16 18  Temp:  97.6 F (36.4 C) 97.6 F (36.4 C)  TempSrc:  Oral   SpO2:  96% 100%  Weight: 61.9 kg    Height: 4\' 8"  (1.422 m)     Physical Exam Vitals and nursing note reviewed.  Constitutional:      General: She is awake. She is not in acute distress.    Appearance: Normal appearance. She is obese. She is ill-appearing. She is not  diaphoretic.  HENT:     Head: Normocephalic.     Nose: No rhinorrhea.     Mouth/Throat:     Mouth: Mucous membranes are moist.  Eyes:     General: No scleral icterus.    Pupils: Pupils are equal, round, and reactive to light.  Neck:     Vascular: No JVD.  Cardiovascular:     Rate and Rhythm: Normal rate and regular rhythm.     Heart sounds: S1 normal and S2 normal.  Pulmonary:     Effort: Pulmonary effort is normal.     Breath sounds: Normal breath sounds. No wheezing, rhonchi or rales.  Abdominal:     General: Bowel sounds are normal. There is no distension.     Palpations: Abdomen is soft.  Musculoskeletal:     Cervical back: Neck supple.     Right lower leg: No edema.     Left lower leg:  No edema.  Skin:    General: Skin is warm and dry.     Coloration: Skin is pale.  Neurological:     General: No focal deficit present.     Mental Status: She is alert and oriented to person, place, and time.  Psychiatric:        Mood and Affect: Mood normal.        Behavior: Behavior normal. Behavior is cooperative.   Data Reviewed:  Results are pending, will review when available. GI PROCEDURES:   Colonoscopy 06/12/2021 as an inpatient by Dr. Myrtie Neither: - Preparation of the colon was fair.  - Decreased sphincter tone found on digital rectal exam.  - The examined portion of the ileum was normal.  - Diverticulosis in the entire examined colon.  - The examination was otherwise normal on direct and retroflexion views.  - No specimens collected. Unable to localize culprit diverticulum by imaging or colonoscopy for this episode of bleeding   Colonoscopy 02/15/2019: - Diverticulosis in the entire examined colon.  - Blood in the rectum, in the recto-sigmoid colon, in the sigmoid colon and in the distal descending colon.  - The distal rectum and anal verge are normal on retroflexion view.  - Two 2 to 5 mm polyps in the transverse colon and in the ascending colon. The polyps were 2 to 5 mm in  size. Given the obscure GI bleed, these were not resected so to not complicate the course with a possible post polypectomy ble - The examined portion of the ileum was normal.  - No specimens collected.   EGD 04/18/2018:  - Small hiatal hernia.  - Gastritis.  - Normal duodenal bulb, first portion of the duodenum and second portion of the duodenum.  - No source of bleeding (current or recent or remote) in the upper GI tract.    Colonoscopy 09/15/2005: Diverticulosis in the ascending to sigmoid colon, recent reddish blood in the colon but no blood in the terminal ileum  Assessment and Plan: Principal Problem:   Acute blood loss anemia In the setting of:   Lower GI bleed Likely from recurrence of colon   Diverticulosis of colon with hemorrhage Admit to stepdown/inpatient. Keep NPO for now. Monitor H&H. Transfuse as needed if Hb < 8.0 g/dL. GI consult appreciated. -Will follow the recommendations.  Active Problems:   PAC (premature atrial contraction) Keep electrolytes optimized.    Essential hypertension Hold antihypertensives today.     History of gout No symptoms at this time.     Hypokalemia Replaced. Magnesium/phosphorus are normal. Follow-up potassium level in the morning.     GERD (gastroesophageal reflux disease) Currently on parenteral PPI. H2 blocker or PPI as needed.     Mild protein malnutrition (HCC) In the setting of acute blood loss/chronic illness. Follow level as an outpatient with primary. Might benefit from protein supplementation.     Class 1 obesity Current BMI 30.59 kg/m. Lifestyle modifications. Follow-up with closely with PCP.     Advance Care Planning:   Code Status: Full Code   Consults: [facility] Alcide Evener NP and Yancey Flemings, MD)  Family Communication:   Severity of Illness: The appropriate patient status for this patient is OBSERVATION. Observation status is judged to be reasonable and necessary in order to provide  the required intensity of service to ensure the patient's safety. The patient's presenting symptoms, physical exam findings, and initial radiographic and laboratory data in the context of their medical condition is felt to place them at  decreased risk for further clinical deterioration. Furthermore, it is anticipated that the patient will be medically stable for discharge from the hospital within 2 midnights of admission.   Author: Bobette Mo, MD 05/12/2023 7:45 AM  For on call review [email or web address].   This document was prepared using Dragon voice recognition software and may contain some unintended transcription errors.

## 2023-05-12 NOTE — ED Triage Notes (Signed)
Pt POV from home d/t blood in stool - dark blood clots.  Pt denies pain - seen recently for same - give Miralax for constipation.

## 2023-05-12 NOTE — ED Provider Notes (Signed)
[place] EMERGENCY DEPARTMENT AT [facility] Provider Note   CSN: [id number] Arrival date & time: 05/12/23  0549     History  Chief Complaint  Patient presents with   Rectal Bleeding    Latoya Bennett is a 87 y.o. female.  The history is provided by the patient, a caregiver and medical records.  Rectal Bleeding Latoya Bennett is a 87 y.o. female who presents to the Emergency Department complaining of rectal bleed.  She presents to the emergency department for evaluation of bright red blood per rectum.  She was recently admitted to the hospital for a diverticular bleed and discharged home on November 3.  Patient states that she was doing well until this morning when she had a large amount of dark red blood and clots per rectum.  Caregiver, who is a neighbor that checks on her frequently states that she has been bleeding since hospital discharge and that she minimizes her symptoms.  She denies any chest pain, abdominal pain, nausea, vomiting.  She does report feeling slightly lightheaded.  She is unsure how many bowel movements she has had since hospital discharge.     Home Medications Prior to Admission medications   Medication Sig Start Date End Date Taking? Authorizing Provider  denosumab (PROLIA) 60 MG/ML SOSY injection Inject 60 mg into the skin every 6 (six) months. 09/16/22   [provider]  diclofenac Sodium (VOLTAREN) 1 % GEL Apply 2 g topically 4 (four) times daily. Patient taking differently: Apply 2 g topically 2 (two) times daily as needed (pain). 04/30/20   Burnadette Pop, MD  famotidine (PEPCID) 20 MG tablet Take 1 tablet (20 mg total) by mouth daily. Patient taking differently: Take 20 mg by mouth daily before breakfast. 06/05/21 05/08/23  Ghimire, Werner Lean, MD  feeding supplement, ENSURE ENLIVE, (ENSURE ENLIVE) LIQD Take 237 mLs by mouth 2 (two) times daily between meals. 02/17/19   [name], DO  ferrous sulfate (FEROSUL)  325 (65 FE) MG tablet Take 1 tablet (325 mg total) by mouth 2 (two) times daily with a meal. TAKE 2 TABLETS(650 MG) BY MOUTH EVERY OTHER DAY Strength: 325 (65 Fe) mg 06/14/21   Zannie Cove, MD  gabapentin (NEURONTIN) 100 MG capsule Take 1 capsule (100 mg total) by mouth at bedtime. 04/13/23   Nche, Bonna Gains, NP  lactose free nutrition (BOOST) LIQD Take 237 mLs by mouth daily.    [provider]  loratadine (CLARITIN) 10 MG tablet Take 1 tablet (10 mg total) by mouth daily. 12/02/22   Linwood Dibbles, MD  Menthol, Topical Analgesic, (BIOFREEZE) 4 % GEL Apply 1 application topically 2 (two) times daily.    [provider]  Multiple Vitamin (MULTIVITAMIN WITH MINERALS) TABS tablet Take 1 tablet by mouth daily. 02/18/19   Sheikh, Kateri Mc Latif, DO  polyethylene glycol (MIRALAX / GLYCOLAX) 17 g packet Take 17 g by mouth daily. 05/11/23   Rolly Salter, MD  Potassium 99 MG TABS Take 99 mg by mouth every morning.    [provider]      Allergies    Patient has no known allergies.    Review of Systems   Review of Systems  Gastrointestinal:  Positive for hematochezia.  All other systems reviewed and are negative.   Physical Exam Updated Vital Signs BP 116/63   Pulse 76   Temp 97.6 F (36.4 C)   Resp 18   Ht 4\' 8"  (1.422 m)   Wt 61.9 kg  SpO2 100%   BMI 30.59 kg/m  Physical Exam Vitals and nursing note reviewed.  Constitutional:      Appearance: She is well-developed.  HENT:     Head: Normocephalic and atraumatic.  Cardiovascular:     Rate and Rhythm: Normal rate and regular rhythm.  Pulmonary:     Effort: Pulmonary effort is normal. No respiratory distress.  Abdominal:     Palpations: Abdomen is soft.     Tenderness: There is no abdominal tenderness. There is no guarding or rebound.  Genitourinary:    Comments: Gross maroon blood on DRE, dark blood present in undergarments.  Chaperone EMT Juliette Alcide present for exam Musculoskeletal:        General: No  tenderness.  Skin:    General: Skin is warm and dry.  Neurological:     Mental Status: She is alert and oriented to person, place, and time.  Psychiatric:        Behavior: Behavior normal.     ED Results / Procedures / Treatments   Labs (all labs ordered are listed, but only abnormal results are displayed) Labs Reviewed  CBC WITH DIFFERENTIAL/PLATELET  COMPREHENSIVE METABOLIC PANEL  TYPE AND SCREEN    EKG None  Radiology No results found.  Procedures Procedures    Medications Ordered in ED Medications - No data to display  ED Course/ Medical Decision Making/ A&P                                 Medical Decision Making Amount and/or Complexity of Data Reviewed Labs: ordered.   Patient coming from home, lives alone here for evaluation of gross blood per rectum.  She does have maroon stool in the rectal vault, also on her undergarments.  She does report being dizzy.  No anticoagulants.  Just discharged on Sunday for diverticular bleed.  Plan to check labs.  Patient care transferred pending labs.        Final Clinical Impression(s) / ED Diagnoses Final diagnoses:  Hematochezia    Rx / DC Orders ED Discharge Orders     None         Tilden Fossa, MD 05/12/23 [id number]

## 2023-05-13 DIAGNOSIS — K5731 Diverticulosis of large intestine without perforation or abscess with bleeding: Secondary | ICD-10-CM | POA: Diagnosis not present

## 2023-05-13 DIAGNOSIS — K922 Gastrointestinal hemorrhage, unspecified: Secondary | ICD-10-CM | POA: Diagnosis not present

## 2023-05-13 LAB — CBC
HCT: 23.3 % — ABNORMAL LOW (ref 36.0–46.0)
Hemoglobin: 8.2 g/dL — ABNORMAL LOW (ref 12.0–15.0)
MCH: 27.1 pg (ref 26.0–34.0)
MCHC: 35.2 g/dL (ref 30.0–36.0)
MCV: 76.9 fL — ABNORMAL LOW (ref 80.0–100.0)
Platelets: 158 10*3/uL (ref 150–400)
RBC: 3.03 MIL/uL — ABNORMAL LOW (ref 3.87–5.11)
RDW: 14.9 % (ref 11.5–15.5)
WBC: 4.1 10*3/uL (ref 4.0–10.5)
nRBC: 0 % (ref 0.0–0.2)

## 2023-05-13 LAB — HEMOGLOBIN AND HEMATOCRIT, BLOOD
HCT: 23.7 % — ABNORMAL LOW (ref 36.0–46.0)
HCT: 21.9 % — ABNORMAL LOW (ref 36.0–46.0)
Hemoglobin: 8.1 g/dL — ABNORMAL LOW (ref 12.0–15.0)
Hemoglobin: 7.4 g/dL — ABNORMAL LOW (ref 12.0–15.0)

## 2023-05-13 LAB — COMPREHENSIVE METABOLIC PANEL
ALT: 12 U/L (ref 0–44)
AST: 14 U/L — ABNORMAL LOW (ref 15–41)
Albumin: 2.9 g/dL — ABNORMAL LOW (ref 3.5–5.0)
Alkaline Phosphatase: 57 U/L (ref 38–126)
Anion gap: 5 (ref 5–15)
BUN: 25 mg/dL — ABNORMAL HIGH (ref 8–23)
CO2: 25 mmol/L (ref 22–32)
Calcium: 9.5 mg/dL (ref 8.9–10.3)
Chloride: 109 mmol/L (ref 98–111)
Creatinine, Ser: 0.67 mg/dL (ref 0.44–1.00)
GFR, Estimated: >60 mL/min (ref >60)
Glucose, Bld: 103 mg/dL — ABNORMAL HIGH (ref 70–99)
Potassium: 3.6 mmol/L (ref 3.5–5.1)
Sodium: 139 mmol/L (ref 135–145)
Total Bilirubin: 0.4 mg/dL (ref <1.2)
Total Protein: 5.3 g/dL — ABNORMAL LOW (ref 6.5–8.1)

## 2023-05-13 NOTE — Progress Notes (Signed)
PROGRESS NOTE  Latoya Bennett MVH:[id number] DOB: 1935/10/03 DOA: 05/12/2023 PCP: Salvatore Decent, FNP  HPI/Recap of past 24 hours: Latoya Bennett is a 87 y.o. female with medical history significant of hypertension, PACs, chronic pain of multiple joints, gout, GERD, diverticulosis, history of lower GI bleed and ABLA who presented on 05/08/2023 to the ED with complaints of having 2 large dark red blood bowel movements associated with mild lightheadedness with her hemoglobin decreasing from her baseline of 13- 14 to 10.8 g/dL.  She was discharged on 11/3 for similar complaints. She uses NSAIDs at home. In the ED, labs showed hemoglobin 8.6 g/dL with an MCV of 84.1 fL and platelets 163. VS fairly stable.  Patient admitted for further management.    Today, patient denies any further rectal bleeding, denies any other new complaints.  Reports feeling overwhelmed, having had to come to the hospital twice for similar issues, anxious about it reoccurring once she is discharged again.  Reports some social issues.  Patient had been seen by the chaplain.    Assessment/Plan: Principal Problem:   Lower GI bleed Active Problems:   Essential hypertension   History of gout   Hypokalemia   GERD (gastroesophageal reflux disease)   Diverticulosis of colon with hemorrhage   Acute blood loss anemia   Possible recurrent lower GI bleed Acute on chronic blood loss anemia Likely diverticular bleed GI consulted, appreciate recs Advance diet as per GI Monitor H&H closely, transfuse if hemoglobin less than 8 Monitor closely for any overt bleeding, will need CT angiogram possible IR embolization   History of PACs Monitor electrolytes   Essential hypertension Not on any BP meds   History of gout History of carpal tunnel syndrome/arthritis No symptoms at this time  GERD Continue PPIs   Mild protein malnutrition (HCC) In the setting of acute blood loss/chronic illness. Follow level as an  outpatient with primary. Might benefit from protein supplementation.  History of class 1 obesity Lifestyle modifications.     Estimated body mass index is 29.51 kg/m as calculated from the following:   Height as of this encounter: 4\' 8"  (1.422 m).   Weight as of this encounter: 59.7 kg.     Code Status: Full  Family Communication: None at bedside  Disposition Plan: Status is: Observation The patient remains OBS appropriate and will d/c before 2 midnights.      Consultants: GI  Procedures: None  Antimicrobials: None  DVT prophylaxis: SCDs   Objective: Vitals:   05/13/23 0900 05/13/23 1000 05/13/23 1100 05/13/23 1200  BP: (!) 131/58 120/80    Pulse: 78 84 91   Resp: (!) 24 (!) 32 18   Temp:    97.8 F (36.6 C)  TempSrc:    Oral  SpO2: 97% 100% 100%   Weight:      Height:        Intake/Output Summary (Last 24 hours) at 05/13/2023 1321 Last data filed at 05/12/2023 1927 Gross per 24 hour  Intake 120 ml  Output --  Net 120 ml   Filed Weights   05/12/23 0559 05/12/23 1814 05/13/23 0600  Weight: 61.9 kg 59.1 kg 59.7 kg    Exam:  General: NAD  Cardiovascular: S1, S2 present Respiratory: CTAB Abdomen: Soft, nontender, nondistended, bowel sounds present Musculoskeletal: No bilateral pedal edema noted Skin: Normal Psychiatry: Normal mood   Data Reviewed: CBC: Recent Labs  Lab 05/09/23 0304 05/09/23 1626 05/10/23 0659 05/12/23 3244 05/12/23 1105 05/12/23 1838 05/12/23 2244 05/13/23 0548 05/13/23 1043  WBC 3.1* 3.9* 3.9* 6.8  --   --   --  4.1  --   NEUTROABS  --   --   --  5.6  --   --   --   --   --   HGB 9.5* 10.0* 9.7* 8.6* 8.0* 7.8* 7.7* 8.2* 8.1*  HCT 26.7* 29.4* 27.6* 25.8* 23.2* 22.5* 22.3* 23.3* 23.7*  MCV 76.7* 76.8* 75.0* 77.5*  --   --   --  76.9*  --   PLT 111* 146* 138* 163  --   --   --  158  --    Basic Metabolic Panel: Recent Labs  Lab 05/08/23 0615 05/09/23 0304 05/12/23 0637 05/13/23 0548  NA 137 138 138 139  K  3.4* 3.5 3.3* 3.6  CL 106 109 105 109  CO2 26 24 24 25   GLUCOSE 121* 100* 130* 103*  BUN 18 12 35* 25*  CREATININE 0.75 0.59 0.92 0.67  CALCIUM 9.4 9.3 9.9 9.5  MG 2.2  --  2.0  --   PHOS 2.5  --  2.8  --    GFR: Estimated Creatinine Clearance: 37.1 mL/min (by C-G formula based on SCr of 0.67 mg/dL). Liver Function Tests: Recent Labs  Lab 05/08/23 0615 05/09/23 0304 05/12/23 0637 05/13/23 0548  AST 17 15 15  14*  ALT 15 14 12 12   ALKPHOS 69 58 66 57  BILITOT 0.6 0.6 0.5 0.4  PROT 6.0* 5.1* 6.1* 5.3*  ALBUMIN 3.3* 2.9* 3.3* 2.9*   Recent Labs  Lab 05/08/23 0615  LIPASE 19   No results for input(s): "AMMONIA" in the last 168 hours. Coagulation Profile: No results for input(s): "INR", "PROTIME" in the last 168 hours. Cardiac Enzymes: No results for input(s): "CKTOTAL", "CKMB", "CKMBINDEX", "TROPONINI" in the last 168 hours. BNP (last 3 results) No results for input(s): "PROBNP" in the last 8760 hours. HbA1C: No results for input(s): "HGBA1C" in the last 72 hours. CBG: No results for input(s): "GLUCAP" in the last 168 hours. Lipid Profile: No results for input(s): "CHOL", "HDL", "LDLCALC", "TRIG", "CHOLHDL", "LDLDIRECT" in the last 72 hours. Thyroid Function Tests: No results for input(s): "TSH", "T4TOTAL", "FREET4", "T3FREE", "THYROIDAB" in the last 72 hours. Anemia Panel: No results for input(s): "VITAMINB12", "FOLATE", "FERRITIN", "TIBC", "IRON", "RETICCTPCT" in the last 72 hours. Urine analysis:    Component Value Date/Time   COLORURINE YELLOW 05/08/2023 1109   APPEARANCEUR CLEAR 05/08/2023 1109   LABSPEC 1.009 05/08/2023 1109   PHURINE 8.0 05/08/2023 1109   GLUCOSEU NEGATIVE 05/08/2023 1109   HGBUR LARGE (A) 05/08/2023 1109   BILIRUBINUR NEGATIVE 05/08/2023 1109   BILIRUBINUR negative 11/08/2015 1258   KETONESUR NEGATIVE 05/08/2023 1109   PROTEINUR NEGATIVE 05/08/2023 1109   UROBILINOGEN 0.2 11/08/2015 1258   UROBILINOGEN 1.0 06/20/2009 1449   NITRITE  NEGATIVE 05/08/2023 1109   LEUKOCYTESUR TRACE (A) 05/08/2023 1109   Sepsis Labs: @LABRCNTIP (procalcitonin:4,lacticidven:4)  ) Recent Results (from the past 240 hour(s))  MRSA Next Gen by PCR, Nasal     Status: None   Collection Time: 05/08/23  2:25 PM   Specimen: Nasal Mucosa; Nasal Swab  Result Value Ref Range Status   MRSA by PCR Next Gen NOT DETECTED NOT DETECTED Final    Comment: (NOTE) The GeneXpert MRSA Assay (FDA approved for NASAL specimens only), is one component of a comprehensive MRSA colonization surveillance program. It is not intended to diagnose MRSA infection nor to guide or monitor treatment for MRSA infections. Test performance is not FDA approved  in patients less than 42 years old. Performed at [facility], [address] [place], [place] [address]   MRSA Next Gen by PCR, Nasal     Status: None   Collection Time: 05/12/23  6:15 PM   Specimen: Nasal Mucosa; Nasal Swab  Result Value Ref Range Status   MRSA by PCR Next Gen NOT DETECTED NOT DETECTED Final    Comment: (NOTE) The GeneXpert MRSA Assay (FDA approved for NASAL specimens only), is one component of a comprehensive MRSA colonization surveillance program. It is not intended to diagnose MRSA infection nor to guide or monitor treatment for MRSA infections. Test performance is not FDA approved in patients less than 76 years old. Performed at [facility], [address] [place], [place] [address]       Studies: No results found.  Scheduled Meds:  Chlorhexidine Gluconate Cloth  6 each Topical Daily   pantoprazole (PROTONIX) IV  40 mg Intravenous Q12H    Continuous Infusions:   LOS: 0 days     Briant Cedar, MD [facility]  If 7PM-7AM, please contact night-coverage [email or web address] 05/13/2023, 1:21 PM

## 2023-05-13 NOTE — Progress Notes (Signed)
Chaplain engaged in an initial visit with Latoya Bennett. She shared about her healthcare journey and the ways she has felt unseen and unheard as a patient. She talked about how difficult it has been to not receive the healthcare services she needs in one place but to be constantly referred to outpatient facilities and places. Latoya Bennett also expressed how hard it has been to have three different issues happening in her body at once from the bleeding to arthritis to carpel tunnel.   She also spent some time thinking about her community and support system. Latoya Bennett has limited family support in [place] but has found a dependable person to help her around her home and with everyday needs. Chaplain asked her about her experience and utilization of home healthcare and she expressed her distrust for that particular system. Chaplain assessed that Latoya Bennett has felt the emotional and mental burden of having to handle some things alone.   Chaplain offered reflective listening, a compassionate presence, support, and prayer with her. Latoya Bennett expressed her need to have medical information shared with her out loud and personally but also in writing via her discharge summary. She recognizes that she can't always remember what the physicians have said and would like to be able to read instructions and guidance. She also desires to understand and see test results. Latoya Bennett desires more communicative care that allows for her to understand and make informed decisions.     05/13/23 0900  Spiritual Encounters  Type of Visit Initial  Care provided to: Patient  Reason for visit Routine spiritual support  Spiritual Framework  Presenting Themes Significant life change;Impactful experiences and emotions;Meaning/purpose/sources of inspiration;Community and relationships;Caregiving needs  Interventions  Spiritual Care Interventions Made Narrative/life review;Established relationship of care and support;Compassionate  presence;Reflective listening;Encouragement  Intervention Outcomes  Outcomes Awareness of support;Connection to spiritual care

## 2023-05-13 NOTE — Care Management Obs Status (Signed)
MEDICARE OBSERVATION STATUS NOTIFICATION   Patient Details  Name: Latoya Bennett MRN: [id number] Date of Birth: 12/13/1935   Medicare Observation Status Notification Given:  Yes     Lavenia Atlas, RN 05/13/2023, 2:24 PM

## 2023-05-13 NOTE — Discharge Instructions (Signed)
No aleve, ibuprofen, goody powders, mobic/meloxicam, diflofenac as these are antiinflammatories and can cause inflammation in your stomach and increase bleeding risk. You can talk with PCP about alternative pain options.  Can do tyelnol max 3000 mg a day, salon pas patches are over the counter and voltern gel is topical antiinflammatory that is safe.  Add on fiber supplement such as benefiber or citracel once daily

## 2023-05-13 NOTE — Progress Notes (Addendum)
Progress Note  Primary GI: Dr. Barron Alvine DOA: 05/12/2023         Hospital Day: 2   Subjective  Chief Complaint: Recurrent painless hematochezia   No family was present at the time of my evaluation, chaplain was present. Patient was drinking broth denies any nausea, vomiting, abdominal pain no further bowel movements.  Per nurse had very small smear of darker appearing stool with wiping otherwise no further GI bleeding. Patient frustrated with recurrent likely diverticular bleeds, discussed avoiding all NSAIDs outpatient, adding on fiber outpatient and the pathophysiology of diverticular bleeds with patient.    Objective   Vital signs in last 24 hours: Temp:  [97.9 F (36.6 C)-98.8 F (37.1 C)] 98.2 F (36.8 C) (11/06 0345) Pulse Rate:  [69-92] 76 (11/06 0800) Resp:  [16-27] 17 (11/06 0800) BP: (99-143)/(39-77) 122/47 (11/06 0800) SpO2:  [87 %-100 %] 99 % (11/06 0800) Weight:  [59.1 kg-59.7 kg] 59.7 kg (11/06 0600) Last BM Date : 05/12/23 Last BM recorded by nurses in past 5 days Stool Type: Type 6 (Mushy consistency with ragged edges) (05/12/2023  6:14 PM)  General:   female in no acute distress  Heart:    Irregular rhythm, no murmur.  Pulm: Clear anteriorly; no wheezing Abdomen:  Soft, Flat AB, Active bowel sounds. No tenderness .  Extremities:  with  edema. Neurologic:  Alert and  oriented x4;  No focal deficits.  Psych:  Cooperative. Normal mood and affect.  Intake/Output from previous day: 11/05 0701 - 11/06 0700 In: 120 [P.O.:120] Out: -  Intake/Output this shift: No intake/output data recorded.  Studies/Results: No results found.  Lab Results: Recent Labs    05/12/23 0637 05/12/23 1105 05/12/23 1838 05/12/23 2244 05/13/23 0548  WBC 6.8  --   --   --  4.1  HGB 8.6*   < > 7.8* 7.7* 8.2*  HCT 25.8*   < > 22.5* 22.3* 23.3*  PLT 163  --   --   --  158   < > = values in this interval not displayed.   BMET Recent Labs    05/12/23 0637 05/13/23 0548   NA 138 139  K 3.3* 3.6  CL 105 109  CO2 24 25  GLUCOSE 130* 103*  BUN 35* 25*  CREATININE 0.92 0.67  CALCIUM 9.9 9.5   LFT Recent Labs    05/13/23 0548  PROT 5.3*  ALBUMIN 2.9*  AST 14*  ALT 12  ALKPHOS 57  BILITOT 0.4   PT/INR No results for input(s): "LABPROT", "INR" in the last 72 hours.   Scheduled Meds:  Chlorhexidine Gluconate Cloth  6 each Topical Daily   pantoprazole (PROTONIX) IV  40 mg Intravenous Q12H   Continuous Infusions:    Patient profile:   87 year old female with past medical history of pancreatic duct dilation with tiny cystic foci in tail of pancreas per MRI, GERD, colon polyps, diverticulosis with recurrent diverticular bleeds presents with recurrent painless hematochezia with drop in hemoglobin from 9.7 on 11/3-8.6, slight elevation of BUN 12-35.  No NSAIDs, no GERD symptoms.   On oral iron 325 twice daily at home. -Last colonoscopy was 06/12/2021 inpatient for diverticular bleed, preparation fair, decreased sphincter tone, normal ileum, diverticulosis entire examined colon, no specimens collected.   Impression/Plan:   Acute on chronic anemia with history of recurrent diverticular bleeds presenting with painless hematochezia 05/13/2023  HGB 8.2 previously 7.7, 7.8, 8.0 Patient has not required any blood transfusions this admission HD stable Recent Labs  05/08/23 1622 05/08/23 2240 05/09/23 0304 05/09/23 1626 05/10/23 0659 05/12/23 0637 05/12/23 1105 05/12/23 1838 05/12/23 2244 05/13/23 0548  HGB 10.0* 9.8* 9.5* 10.0* 9.7* 8.6* 8.0* 7.8* 7.7* 8.2*  -No plan for endoscopic evaluation at this time -May benefit from IV iron -Will advance diet to full liquid diet and then can advance to soft diet -Continue ambulation in the hospital, may benefit from PT services outpatient due to repeated hospitalizations and weakness - If patient has large volume rectal bleeding again, please get CTA and consult IR if positive.  --Continue to monitor  H&H with transfusion as needed to maintain hemoglobin greater than 7. -Avoid NSAIDs outpatient, add on outpatient fiber  GERD While patient has mild BUN elevation, HD stable, low suspicion for upper GI bleed -Continue Protonix 40 mg IV twice daily -Avoid NSAIDs -Continue 40 mg twice daily outpatient -No plan for endoscopic evaluation at this time   Principal Problem:   Lower GI bleed Active Problems:   PAC (premature atrial contraction)   Essential hypertension   History of gout   Hypokalemia   GERD (gastroesophageal reflux disease)   Diverticulosis of colon with hemorrhage   Acute blood loss anemia   Mild protein malnutrition (HCC)   Class 1 obesity    LOS: 0 days   Doree Albee  05/13/2023, 8:21 AM  GI ATTENDING  Interval history data reviewed.  Agree with interval progress note.  Stable since yesterday.  No clinically significant bleeding.  Hemoglobin stable.  Advance diet.  Continue to monitor stools and blood counts.  She does not need endoscopic procedures.  If recurrent bleeding, proceed to CTA (and subsequent IR embolization of possible) as previously recommended.  Wilhemina Bonito. Eda Keys., M.D. [facility] Division of Gastroenterology

## 2023-05-13 NOTE — TOC Initial Note (Signed)
Transition of Care [facility]) - Initial/Assessment Note    Patient Details  Name: Latoya Bennett MRN: [id number] Date of Birth: Nov 25, 1935  Transition of Care [facility]) CM/SW Contact:    Lavenia Atlas, RN Phone Number: 05/13/2023, 3:02 PM  Clinical Narrative:   Per chart review patient currently in [facility] SDU for lower GI bleed. This RNCM spoke with patient at beside to explain current OBS status and delivered Medicare OBS notice. Patient reports she would like to leave the hospital if there's no life or death situation involved. This RNCM encouraged patient to discuss further with the MD and RN. Patient reports she has caregivers and a neighbor that helps her out and will not want home health services. This RNCM encouraged patient to continue the medical course prior to just leaving the hospital, patient agrees.   TOC will continue to follow for discharge needs.                Expected Discharge Plan: Home/Self Care Barriers to Discharge: Continued Medical Work up   Patient Goals and CMS Choice Patient states their goals for this hospitalization and ongoing recovery are:: to return home CMS Medicare.gov Compare Post Acute Care list provided to:: Patient Choice offered to / list presented to : Patient [place] ownership interest in [facility].provided to:: Patient    Expected Discharge Plan and Services In-house Referral: NA Discharge Planning Services: CM Consult Post Acute Care Choice: NA (unknown at this time) Living arrangements for the past 2 months: Single Family Home                 DME Arranged: N/A DME Agency: NA       HH Arranged: NA HH Agency: NA        Prior Living Arrangements/Services Living arrangements for the past 2 months: Single Family Home Lives with:: Self Patient language and need for interpreter reviewed:: Yes Do you feel safe going back to the place where you live?: Yes      Need for Family Participation in Patient Care: No  (Comment) Care giver support system in place?: Yes (comment) Current home services: DME, Homehealth aide (DME: scooter, walker, has care givers prior to admission) Criminal Activity/Legal Involvement Pertinent to Current Situation/Hospitalization: No - Comment as needed  Activities of Daily Living   ADL Screening (condition at time of admission) Independently performs ADLs?: Yes (appropriate for developmental age) Is the patient deaf or have difficulty hearing?: No Does the patient have difficulty seeing, even when wearing glasses/contacts?: No Does the patient have difficulty concentrating, remembering, or making decisions?: No  Permission Sought/Granted Permission sought to share information with : Case Manager Permission granted to share information with : Yes, Verbal Permission Granted  Share Information with NAME: Scientist, physiological           Emotional Assessment Appearance:: Appears stated age Attitude/Demeanor/Rapport: Engaged, Self-Confident Affect (typically observed): Accepting Orientation: : Oriented to Self, Oriented to Place, Oriented to Situation Alcohol / Substance Use: Not Applicable Psych Involvement: No (comment)  Admission diagnosis:  Hematochezia [K92.1] Lower GI bleed [K92.2] Patient Active Problem List   Diagnosis Date Noted   Lower GI bleed 05/08/2023   Mild protein malnutrition (HCC) 05/08/2023   Microcytic anemia 05/08/2023   Class 1 obesity 05/08/2023   Chronic pain of multiple joints 04/13/2023   Age-related osteoporosis without current pathological fracture 12/15/2022   Onychomycosis of great toe 12/15/2022   Diverticulitis 06/03/2021   Acute blood loss anemia    Abnormal finding on  GI tract imaging    Hypokalemia 02/12/2019   GERD (gastroesophageal reflux disease) 02/12/2019   Symptomatic anemia 02/12/2019   Diverticulosis of colon with hemorrhage    Osteoarthritis of multiple joints 05/19/2018   History of gout 05/19/2018   GI bleed 04/17/2018    Chronic pain of both ankles 03/13/2017   Osteoarthritis of both ankles 03/13/2017   PAC (premature atrial contraction) 11/24/2016   Essential hypertension 11/24/2016   Primary osteoarthritis of left knee 11/24/2016   Tachycardia 11/24/2016   PCP:  Salvatore Decent, FNP Pharmacy:   [id number] [place], [place] - [address] AT [facility] OF [facility] [address] [place] [address] Phone: [phone] Fax: [phone]  [facility] [address] [place] [address] Phone: [phone] Fax: [phone]     Social Determinants of Health (SDOH) Social History: SDOH Screenings   Food Insecurity: No Food Insecurity (05/12/2023)  Housing: Low Risk  (05/12/2023)  Transportation Needs: No Transportation Needs (05/12/2023)  Utilities: Not At Risk (05/12/2023)  Depression (PHQ2-9): Low Risk  (04/13/2023)  Financial Resource Strain: Low Risk  (09/09/2022)   Received from [facility], [facility]  Physical Activity: Sufficiently Active (04/01/2021)   Received from [facility], [facility]  Social Connections: Unknown (11/03/2021)   Received from [facility], [facility]  Stress: Stress Concern Present (04/01/2021)   Received from [facility], [facility]  Tobacco Use: Low Risk  (05/12/2023)   SDOH Interventions:     Readmission Risk Interventions    06/04/2021   12:30 PM  Readmission Risk Prevention Plan  Post Dischage Appt Complete  Medication Screening Complete  Transportation Screening Complete

## 2023-05-13 NOTE — Evaluation (Signed)
Physical Therapy Evaluation Patient Details Name: Latoya Bennett MRN: [id number] DOB: [birth year] Today's Date: 05/13/2023  History of Present Illness  Latoya Bennett is a 87 y.o. female with medical history significant of hypertension, PACs, chronic pain of multiple joints, gout, GERD, diverticulosis, history of lower GI bleed and ABLA who presented on 05/08/2023 to the ED with complaints of having 2 large dark red blood bowel movements associated with mild lightheadedness with her hemoglobin decreasing from her baseline of 13- 14 to 10.8 g/dL.  She was discharged on 11/3 for similar complaints.  Clinical Impression  Pt admitted with above diagnosis.  Pt currently with functional limitations due to the deficits listed below (see PT Problem List). Pt will benefit from acute skilled PT to increase their independence and safety with mobility to allow discharge.    Patient known to PT from recent admission. Patient eager to return home. Patient shoul=d progress to return home.3        If plan is discharge home, recommend the following: A little help with bathing/dressing/bathroom;Assistance with feeding;Direct supervision/assist for medications management;Assist for transportation;Help with stairs or ramp for entrance   Can travel by private vehicle        Equipment Recommendations None recommended by PT  Recommendations for Other Services       Functional Status Assessment Patient has had a recent decline in their functional status and demonstrates the ability to make significant improvements in function in a reasonable and predictable amount of time.     Precautions / Restrictions Precautions Precautions: Fall Restrictions Weight Bearing Restrictions: No      Mobility  Bed Mobility Overal bed mobility: Independent                  Transfers Overall transfer level: Needs assistance Equipment used: Rolling walker (2 wheels) Transfers: Sit to/from Stand, Bed to  chair/wheelchair/BSC Sit to Stand: Supervision   Step pivot transfers: Supervision       General transfer comment: [facility]nasfer to [facility] then ambulated 5 ' with 1 UE support, RW not close by,    Ambulation/Gait                  Stairs            Wheelchair Mobility     Tilt Bed    Modified Rankin (Stroke Patients Only)       Balance Overall balance assessment: Mild deficits observed, not formally tested                                           Pertinent Vitals/Pain Pain Assessment Pain Assessment: Faces Faces Pain Scale: Hurts even more Pain Location: legs Pain Intervention(s): Monitored during session    Home Living Family/patient expects to be discharged to:: Private residence Living Arrangements: Alone Available Help at Discharge: Family;Available PRN/intermittently Type of Home: House Home Access: Ramped entrance       Home Layout: One level Home Equipment: Cane - single point Additional Comments: has a frined who helps with groceries, etc    Prior Function Prior Level of Function : Independent/Modified Independent;Driving             Mobility Comments: works in yard, DME outside ADLs Comments: IADL's     Extremity/Trunk Assessment   Upper Extremity Assessment Upper Extremity Assessment: Overall WFL for tasks assessed    Lower Extremity Assessment RLE Deficits / Details:  varus knee LLE Deficits / Details: varus knee    Cervical / Trunk Assessment Cervical / Trunk Assessment: Normal  Communication   Communication Communication: No apparent difficulties  Cognition Arousal: Alert Behavior During Therapy: Impulsive Overall Cognitive Status: Within Functional Limits for tasks assessed                                          General Comments      Exercises     Assessment/Plan    PT Assessment Patient needs continued PT services  PT Problem List Decreased strength;Decreased  mobility;Decreased activity tolerance;Decreased range of motion;Decreased knowledge of precautions       PT Treatment Interventions DME instruction;Therapeutic exercise;Gait training;Therapeutic activities;Patient/family education    PT Goals (Current goals can be found in the Care Plan section)  Acute Rehab PT Goals Patient Stated Goal: go home PT Goal Formulation: With patient Time For Goal Achievement: 05/27/23 Potential to Achieve Goals: Good    Frequency Min 1X/week     Co-evaluation               AM-PAC PT "6 Clicks" Mobility  Outcome Measure Help needed turning from your back to your side while in a flat bed without using bedrails?: None Help needed moving from lying on your back to sitting on the side of a flat bed without using bedrails?: None Help needed moving to and from a bed to a chair (including a wheelchair)?: A Little Help needed standing up from a chair using your arms (e.g., wheelchair or bedside chair)?: A Little Help needed to walk in hospital room?: A Little Help needed climbing 3-5 steps with a railing? : A Lot 6 Click Score: 19    End of Session   Activity Tolerance: Patient tolerated treatment well Patient left: in chair;with chair alarm set;with call bell/phone within reach Nurse Communication: Mobility status PT Visit Diagnosis: Unsteadiness on feet (R26.81)    Time: 1443-1500 PT Time Calculation (min) (ACUTE ONLY): 17 min   Charges:   PT Evaluation $PT Eval Low Complexity: 1 Low   PT General Charges $$ ACUTE PT VISIT: 1 Visit         Blanchard Kelch PT Acute Rehabilitation Services Office [phone] Weekend pager-[phone]   Rada Hay 05/13/2023, 4:20 PM

## 2023-05-14 DIAGNOSIS — D62 Acute posthemorrhagic anemia: Secondary | ICD-10-CM | POA: Diagnosis not present

## 2023-05-14 DIAGNOSIS — K922 Gastrointestinal hemorrhage, unspecified: Secondary | ICD-10-CM | POA: Diagnosis not present

## 2023-05-14 LAB — HEMOGLOBIN AND HEMATOCRIT, BLOOD
HCT: 23.8 % — ABNORMAL LOW (ref 36.0–46.0)
Hemoglobin: 7.8 g/dL — ABNORMAL LOW (ref 12.0–15.0)

## 2023-05-14 MED ORDER — PANTOPRAZOLE SODIUM 40 MG PO TBEC: 40.0000 mg | DELAYED_RELEASE_TABLET | Freq: Two times a day (BID) | ORAL | 0 refills | Status: AC

## 2023-05-14 MED ORDER — SODIUM CHLORIDE 0.9 % IV SOLN
100.0000 mg | Freq: Once | INTRAVENOUS | Status: AC
  Administered 2023-05-14: 100 mg via INTRAVENOUS
  Filled 2023-05-14: qty 5

## 2023-05-14 NOTE — Evaluation (Signed)
Occupational Therapy Evaluation Patient Details Name: Latoya Bennett MRN: [id number] DOB: [birth year] Today's Date: 05/14/2023   History of Present Illness Latoya Bennett is a 87 yr old female with medical history significant of hypertension, PACs, chronic pain of multiple joints, gout, GERD, diverticulosis, history of lower GI bleed and ABLA who presented on 05/08/2023 to the ED with complaints of having 2 large dark red blood bowel movements associated with mild lightheadedness with her hemoglobin decreasing from her baseline of 13-14 to 10.8 g/dL.  She was discharged on 11/3 for similar complaints.   Clinical Impression   The pt is currently presenting slightly below her baseline level of functioning for self-care management, as she is limited by the below listed deficits (see OT problem list). At current, she requires SBA for tasks including upper and lower body dressing, sit to stand, ambulating using a cane, and grooming in sitting. She is noted to be with slight deconditioning and generalized weakness. She will benefit from OT services in the hospital setting to maximize her independence with self care tasks and to facilitate her safe return home. Anticipate no post-acute OT needs.       If plan is discharge home, recommend the following: Assist for transportation;Assistance with cooking/housework    Functional Status Assessment  Patient has had a recent decline in their functional status and demonstrates the ability to make significant improvements in function in a reasonable and predictable amount of time.  Equipment Recommendations  Tub/shower seat    Recommendations for Other Services       Precautions / Restrictions Precautions Precautions: Fall Restrictions Weight Bearing Restrictions: No      Mobility Bed Mobility Overal bed mobility: Needs Assistance Bed Mobility: Supine to Sit     Supine to sit: Supervision          Transfers Overall transfer  level: Needs assistance Equipment used: Straight cane Transfers: Sit to/from Stand Sit to Stand: Supervision                  Balance Overall balance assessment: Mild deficits observed, not formally tested       ADL either performed or assessed with clinical judgement   ADL Overall ADL's : Needs assistance/impaired Eating/Feeding: Independent;Sitting Eating/Feeding Details (indicate cue type and reason): based on clinical judgement Grooming: Set up;Sitting Grooming Details (indicate cue type and reason): She performed hair combing seated EOB.         Upper Body Dressing : Set up;Sitting Upper Body Dressing Details (indicate cue type and reason): She donned an overhead gown and around the back robe seated EOB. Lower Body Dressing: Set up;Supervision/safety Lower Body Dressing Details (indicate cue type and reason): She doffed then donned her socks seated EOB.                                  Pertinent Vitals/Pain Pain Assessment Pain Assessment: No/denies pain     Extremity/Trunk Assessment Upper Extremity Assessment Upper Extremity Assessment: Overall WFL for tasks assessed   Lower Extremity Assessment Lower Extremity Assessment: Generalized weakness       Communication Communication Communication: No apparent difficulties   Cognition Arousal: Alert Behavior During Therapy: WFL for tasks assessed/performed Overall Cognitive Status: Within Functional Limits for tasks assessed                     Home Living Family/patient expects to be discharged to:: Private residence Living Arrangements: Alone  Available Help at Discharge: Friend(s);Available PRN/intermittently Type of Home: House       Home Layout: One level               Home Equipment: Gilmer Mor - single point   Additional Comments: Has a friend who assists with yard work and other IADLs as needed.      Prior Functioning/Environment Prior Level of Function :  Independent/Modified Independent;Driving             Mobility Comments: Intermittent use of a cane for ambulation. ADLs Comments: She was modified independent to independent with ADLs.        OT Problem List: Decreased strength;Impaired balance (sitting and/or standing);Decreased activity tolerance      OT Treatment/Interventions: Self-care/ADL training;Therapeutic exercise;Energy conservation;DME and/or AE instruction;Therapeutic activities;Patient/family education;Balance training    OT Goals(Current goals can be found in the care plan section) Acute Rehab OT Goals Patient Stated Goal: to get better and return home OT Goal Formulation: With patient Time For Goal Achievement: 05/28/23 Potential to Achieve Goals: Good ADL Goals Pt Will Perform Grooming: with modified independence;standing Pt Will Perform Lower Body Dressing: with modified independence;sit to/from stand Pt Will Transfer to Toilet: with modified independence;ambulating;grab bars Pt Will Perform Toileting - Clothing Manipulation and hygiene: with modified independence;sit to/from stand  OT Frequency: Min 1X/week    Co-evaluation              AM-PAC OT "6 Clicks" Daily Activity     Outcome Measure Help from another person eating meals?: None Help from another person taking care of personal grooming?: None Help from another person toileting, which includes using toliet, bedpan, or urinal?: A Little Help from another person bathing (including washing, rinsing, drying)?: A Little Help from another person to put on and taking off regular upper body clothing?: A Little Help from another person to put on and taking off regular lower body clothing?: A Little 6 Click Score: 20   End of Session Equipment Utilized During Treatment: Other (comment) (cane) Nurse Communication: Other (comment) (nurse cleared pt for therapy)  Activity Tolerance: Patient tolerated treatment well Patient left: in chair;with call  bell/phone within reach  OT Visit Diagnosis: Muscle weakness (generalized) (M62.81);Unsteadiness on feet (R26.81)                Time: 1110-1131 OT Time Calculation (min): 21 min Charges:  OT General Charges $OT Visit: 1 Visit OT Evaluation $OT Eval Low Complexity: 1 Low    Ahmira Boisselle L Renly Guedes, OTR/L 05/14/2023, 3:14 PM

## 2023-05-14 NOTE — Progress Notes (Addendum)
Progress Note  Primary GI: Dr. Barron Alvine DOA: 05/12/2023         Hospital Day: 3   Subjective  Chief Complaint: Recurrent painless hematochezia   No family was present at the time of my evaluation. Patient was ordering breakfast for soft breakfast.  She has not had any nausea, vomiting, abdominal pain.  No fevers or chills.  She has not had any further bowel movements. Wishing to go home at this time.     Objective   Vital signs in last 24 hours: Temp:  [97.8 F (36.6 C)-98.5 F (36.9 C)] 98.4 F (36.9 C) (11/07 0800) Pulse Rate:  [78-93] 85 (11/06 2000) Resp:  [15-32] 17 (11/07 0700) BP: (84-148)/(32-80) 127/49 (11/07 0700) SpO2:  [97 %-100 %] 100 % (11/06 2000) Last BM Date : 05/12/23 Last BM recorded by nurses in past 5 days Stool Type: Type 6 (Mushy consistency with ragged edges) (05/12/2023  6:14 PM)  General:   female in no acute distress  Heart:    Irregular rhythm, no murmur.  Pulm: Clear anteriorly; no wheezing Abdomen:  Soft, Flat AB, Active bowel sounds. No tenderness .  Extremities:  with  edema. Neurologic:  Alert and  oriented x4;  No focal deficits.  Psych:  Cooperative. Normal mood and affect.  Intake/Output from previous day: 11/06 0701 - 11/07 0700 In: -  Out: 200 [Urine:200] Intake/Output this shift: No intake/output data recorded.  Studies/Results: No results found.  Lab Results: Recent Labs    05/12/23 0637 05/12/23 1105 05/13/23 0548 05/13/23 1043 05/13/23 2209 05/14/23 0617  WBC 6.8  --  4.1  --   --   --   HGB 8.6*   < > 8.2* 8.1* 7.4* 7.8*  HCT 25.8*   < > 23.3* 23.7* 21.9* 23.8*  PLT 163  --  158  --   --   --    < > = values in this interval not displayed.   BMET Recent Labs    05/12/23 0637 05/13/23 0548  NA 138 139  K 3.3* 3.6  CL 105 109  CO2 24 25  GLUCOSE 130* 103*  BUN 35* 25*  CREATININE 0.92 0.67  CALCIUM 9.9 9.5   LFT Recent Labs    05/13/23 0548  PROT 5.3*  ALBUMIN 2.9*  AST 14*  ALT 12  ALKPHOS  57  BILITOT 0.4   PT/INR No results for input(s): "LABPROT", "INR" in the last 72 hours.   Scheduled Meds:  Chlorhexidine Gluconate Cloth  6 each Topical Daily   pantoprazole (PROTONIX) IV  40 mg Intravenous Q12H   Continuous Infusions:  iron sucrose 100 mg (05/14/23 0902)      Patient profile:   87 year old female with past medical history of pancreatic duct dilation with tiny cystic foci in tail of pancreas per MRI, GERD, colon polyps, diverticulosis with recurrent diverticular bleeds presents with recurrent painless hematochezia with drop in hemoglobin from 9.7 on 11/3-8.6, slight elevation of BUN 12-35.  No NSAIDs, no GERD symptoms.   On oral iron 325 twice daily at home. -Last colonoscopy was 06/12/2021 inpatient for diverticular bleed, preparation fair, decreased sphincter tone, normal ileum, diverticulosis entire examined colon, no specimens collected.   Impression/Plan:   Acute on chronic anemia with history of recurrent diverticular bleeds presenting with painless hematochezia HGB 8.2-->7.8 Patient has not required any blood transfusions this admission HD stable, no GI bleeding overnight -IV iron per hospitalist -advanced diet to soft diet - If patient has large  volume rectal bleeding again, please get CTA and consult IR if positive.  --Continue to monitor H&H with transfusion as needed to maintain hemoglobin greater than 7. -Patient has not had any further rectal bleeding overnight, no bowel movements, should be okay for discharge today. -Avoid NSAIDs outpatient, add on outpatient fiber -Needs monitoring of CBC outpatient 2 to 4 weeks -[facility] will sign off.  Please contact us if we can be of any further assistance during this hospital stay.  GERD -Avoid NSAIDs -Continue 40 mg twice daily outpatient for 2-4 weeks, then once daily -No plan for endoscopic evaluation at this time   Principal Problem:   Lower GI bleed Active Problems:   PAC (premature atrial  contraction)   Essential hypertension   History of gout   Hypokalemia   GERD (gastroesophageal reflux disease)   Diverticulosis of colon with hemorrhage   Acute blood loss anemia   Mild protein malnutrition (HCC)   Class 1 obesity    LOS: 0 days   Doree Albee  05/14/2023, 9:09 AM  GI ATTENDING  Interval history data reviewed.  Agree with interval progress note as outlined.  No additions or deletions.  Will sign off.  Wilhemina Bonito. Eda Keys., M.D. [facility] Division of Gastroenterology

## 2023-05-14 NOTE — Plan of Care (Signed)

## 2023-05-14 NOTE — Discharge Summary (Signed)
Physician Discharge Summary   Patient: Latoya Bennett MRN: [id number] DOB: 06-19-1936  Admit date:     05/12/2023  Discharge date: 05/14/23  Discharge Physician: Briant Cedar   PCP: Salvatore Decent, FNP   Recommendations at discharge:   Follow up with PCP  Discharge Diagnoses: Principal Problem:   Lower GI bleed Active Problems:   PAC (premature atrial contraction)   Essential hypertension   History of gout   Hypokalemia   GERD (gastroesophageal reflux disease)   Diverticulosis of colon with hemorrhage   Acute blood loss anemia   Mild protein malnutrition (HCC)   Class 1 obesity    Hospital Course: Latoya Bennett is a 87 y.o. female with medical history significant of hypertension, PACs, chronic pain of multiple joints, gout, GERD, diverticulosis, history of lower GI bleed and ABLA who presented on 05/08/2023 to the ED with complaints of having 2 large dark red blood bowel movements associated with mild lightheadedness with her hemoglobin decreasing from her baseline of 13- 14 to 10.8 g/dL.  She was discharged on 11/3 for similar complaints. She uses NSAIDs at home. In the ED, labs showed hemoglobin 8.6 g/dL with an MCV of 25.3 fL and platelets 163. VS fairly stable.  Patient admitted for further management.    Today, pt had no more bleeding per rectum. Denies any new complaints, eager to be discharged.   Assessment and Plan:  Possible recurrent lower GI bleed Acute on chronic blood loss anemia Likely diverticular bleed GI consulted, no further management Gave 1 dose of IV iron on 05/14/23 Follow up with PCP   History of PACs   Essential hypertension Not on any BP meds   History of gout History of carpal tunnel syndrome/arthritis No symptoms at this time   GERD Continue PPIs   Mild protein malnutrition (HCC) In the setting of acute blood loss/chronic illness. Follow up with PCP   History of class 1 obesity Lifestyle modifications.        Consultants: GI Procedures performed: None Disposition: Home Diet recommendation:  Regular diet    DISCHARGE MEDICATION: Allergies as of 05/14/2023       Reactions   Nsaids Other (See Comments)   Diverticular bleed        Medication List     STOP taking these medications    famotidine 20 MG tablet Commonly known as: Pepcid       TAKE these medications    Biofreeze 4 % Gel Generic drug: Menthol (Topical Analgesic) Apply 1 application topically 2 (two) times daily.   denosumab 60 MG/ML Sosy injection Commonly known as: PROLIA Inject 60 mg into the skin every 6 (six) months.   diclofenac Sodium 1 % Gel Commonly known as: VOLTAREN Apply 2 g topically 4 (four) times daily. What changed:  when to take this reasons to take this   lactose free nutrition Liqd Take 237 mLs by mouth daily.   feeding supplement Liqd Take 237 mLs by mouth 2 (two) times daily between meals.   ferrous sulfate 325 (65 FE) MG tablet Commonly known as: FeroSul Take 1 tablet (325 mg total) by mouth 2 (two) times daily with a meal. TAKE 2 TABLETS(650 MG) BY MOUTH EVERY OTHER DAY Strength: 325 (65 Fe) mg   gabapentin 100 MG capsule Commonly known as: NEURONTIN Take 1 capsule (100 mg total) by mouth at bedtime.   loratadine 10 MG tablet Commonly known as: CLARITIN Take 1 tablet (10 mg total) by mouth daily.   multivitamin with minerals  Tabs tablet Take 1 tablet by mouth daily.   pantoprazole 40 MG tablet Commonly known as: Protonix Take 1 tablet (40 mg total) by mouth 2 (two) times daily.   polyethylene glycol 17 g packet Commonly known as: MIRALAX / GLYCOLAX Take 17 g by mouth daily.   Potassium 99 MG Tabs Take 99 mg by mouth every morning.        Follow-up Information     Salvatore Decent, FNP. Schedule an appointment as soon as possible for a visit in 1 week(s).   Specialty: Internal Medicine Contact information: [address] [place]  [address] [phone]                Discharge Exam: Ceasar Mons Weights   05/12/23 0559 05/12/23 1814 05/13/23 0600  Weight: 61.9 kg 59.1 kg 59.7 kg   General: NAD  Cardiovascular: S1, S2 present Respiratory: CTAB Abdomen: Soft, nontender, nondistended, bowel sounds present Musculoskeletal: No bilateral pedal edema noted Skin: Normal Psychiatry: Normal mood   Condition at discharge: stable  The results of significant diagnostics from this hospitalization (including imaging, microbiology, ancillary and laboratory) are listed below for reference.   Imaging Studies: No results found.  Microbiology: Results for orders placed or performed during the hospital encounter of 05/12/23  MRSA Next Gen by PCR, Nasal     Status: None   Collection Time: 05/12/23  6:15 PM   Specimen: Nasal Mucosa; Nasal Swab  Result Value Ref Range Status   MRSA by PCR Next Gen NOT DETECTED NOT DETECTED Final    Comment: (NOTE) The GeneXpert MRSA Assay (FDA approved for NASAL specimens only), is one component of a comprehensive MRSA colonization surveillance program. It is not intended to diagnose MRSA infection nor to guide or monitor treatment for MRSA infections. Test performance is not FDA approved in patients less than 75 years old. Performed at [facility], [address], [place], [place] [address]     Labs: CBC: Recent Labs  Lab 05/09/23 0304 05/09/23 1626 05/10/23 0659 05/12/23 0637 05/12/23 1105 05/12/23 2244 05/13/23 0548 05/13/23 1043 05/13/23 2209 05/14/23 0617  WBC 3.1* 3.9* 3.9* 6.8  --   --  4.1  --   --   --   NEUTROABS  --   --   --  5.6  --   --   --   --   --   --   HGB 9.5* 10.0* 9.7* 8.6*   < > 7.7* 8.2* 8.1* 7.4* 7.8*  HCT 26.7* 29.4* 27.6* 25.8*   < > 22.3* 23.3* 23.7* 21.9* 23.8*  MCV 76.7* 76.8* 75.0* 77.5*  --   --  76.9*  --   --   --   PLT 111* 146* 138* 163  --   --  158  --   --   --    < > = values in this interval not displayed.    Basic Metabolic Panel: Recent Labs  Lab 05/08/23 0615 05/09/23 0304 05/12/23 0637 05/13/23 0548  NA 137 138 138 139  K 3.4* 3.5 3.3* 3.6  CL 106 109 105 109  CO2 26 24 24 25   GLUCOSE 121* 100* 130* 103*  BUN 18 12 35* 25*  CREATININE 0.75 0.59 0.92 0.67  CALCIUM 9.4 9.3 9.9 9.5  MG 2.2  --  2.0  --   PHOS 2.5  --  2.8  --    Liver Function Tests: Recent Labs  Lab 05/08/23 0615 05/09/23 0304 05/12/23 4259  05/13/23 0548  AST 17 15 15  14*  ALT 15 14 12 12   ALKPHOS 69 58 66 57  BILITOT 0.6 0.6 0.5 0.4  PROT 6.0* 5.1* 6.1* 5.3*  ALBUMIN 3.3* 2.9* 3.3* 2.9*   CBG: No results for input(s): "GLUCAP" in the last 168 hours.  Discharge time spent: less than 30 minutes.  Signed: Briant Cedar, MD [facility] 05/14/2023

## 2023-05-14 NOTE — Plan of Care (Signed)

## 2023-05-18 ENCOUNTER — Telehealth: Payer: Self-pay | Admitting: Internal Medicine

## 2023-05-18 NOTE — Telephone Encounter (Signed)
Temi called pt to schedule follow up visit. Pt was crying and said if we are not going to help her to stop calling. Pt said she has been sent to multiple places w/out help.   Pt did go to [facility] and was referred for nerve conduction study. Pt declined. Pt was referred to [facility] but did not follow up after visit in July 2024 as recommended. Pt went to [facility] in [facility] Oct 2024 and was advised to follow up with PCP. Pt has had 2 recent hospital visits for GI bleed.   Can you call to follow up and see if she is more responsive to you?

## 2023-05-22 ENCOUNTER — Telehealth: Payer: Self-pay

## 2023-05-22 ENCOUNTER — Other Ambulatory Visit: Payer: Self-pay

## 2023-05-22 ENCOUNTER — Encounter (HOSPITAL_COMMUNITY): Payer: Self-pay | Admitting: Emergency Medicine

## 2023-05-22 ENCOUNTER — Emergency Department (HOSPITAL_COMMUNITY)
Admission: EM | Admit: 2023-05-22 | Discharge: 2023-05-22 | Disposition: A | Discharge status: Home / Self Care | Payer: Medicare Other | Attending: Emergency Medicine | Admitting: Emergency Medicine

## 2023-05-22 DIAGNOSIS — R519 Headache, unspecified: Secondary | ICD-10-CM | POA: Diagnosis present

## 2023-05-22 DIAGNOSIS — R142 Eructation: Secondary | ICD-10-CM | POA: Diagnosis not present

## 2023-05-22 DIAGNOSIS — R799 Abnormal finding of blood chemistry, unspecified: Secondary | ICD-10-CM | POA: Insufficient documentation

## 2023-05-22 LAB — CBC
HCT: 27.9 % — ABNORMAL LOW (ref 36.0–46.0)
Hemoglobin: 9.4 g/dL — ABNORMAL LOW (ref 12.0–15.0)
MCH: 26 pg (ref 26.0–34.0)
MCHC: 33.7 g/dL (ref 30.0–36.0)
MCV: 77.1 fL — ABNORMAL LOW (ref 80.0–100.0)
Platelets: 248 10*3/uL (ref 150–400)
RBC: 3.62 MIL/uL — ABNORMAL LOW (ref 3.87–5.11)
RDW: 14.6 % (ref 11.5–15.5)
WBC: 4.4 10*3/uL (ref 4.0–10.5)
nRBC: 0 % (ref 0.0–0.2)

## 2023-05-22 LAB — BASIC METABOLIC PANEL
Anion gap: 7 (ref 5–15)
BUN: 13 mg/dL (ref 8–23)
CO2: 27 mmol/L (ref 22–32)
Calcium: 10 mg/dL (ref 8.9–10.3)
Chloride: 105 mmol/L (ref 98–111)
Creatinine, Ser: 0.73 mg/dL (ref 0.44–1.00)
GFR, Estimated: >60 mL/min (ref >60)
Glucose, Bld: 108 mg/dL — ABNORMAL HIGH (ref 70–99)
Potassium: 3.5 mmol/L (ref 3.5–5.1)
Sodium: 139 mmol/L (ref 135–145)

## 2023-05-22 LAB — CBG MONITORING, ED: Glucose-Capillary: 99 mg/dL (ref 70–99)

## 2023-05-22 NOTE — ED Provider Notes (Signed)
[place] EMERGENCY DEPARTMENT AT [facility] Provider Note   CSN: [id number] Arrival date & time: 05/22/23  1156     History  Chief Complaint  Patient presents with   Headache    Latoya Bennett is a 87 y.o. female.  87 yo F with a chief complaint of not feeling well.  She tells me that she got a call from the hospital asking her how she was doing and she said terrible and they encouraged her to come to the ED for evaluation.  She had had a headache at that time and had been having frequent belching.  Feels like the belching is still going on but the headache is resolved.  She denies any chest pain or pressure denies shortness of breath denies nausea vomiting or diarrhea.  Denied one-sided numbness or weakness no difficulty speech or swallowing.   Headache      Home Medications Prior to Admission medications   Medication Sig Start Date End Date Taking? Authorizing Provider  denosumab (PROLIA) 60 MG/ML SOSY injection Inject 60 mg into the skin every 6 (six) months. 09/16/22   [provider]  diclofenac Sodium (VOLTAREN) 1 % GEL Apply 2 g topically 4 (four) times daily. Patient taking differently: Apply 2 g topically 2 (two) times daily as needed (pain). 04/30/20   Burnadette Pop, MD  feeding supplement, ENSURE ENLIVE, (ENSURE ENLIVE) LIQD Take 237 mLs by mouth 2 (two) times daily between meals. 02/17/19   [name], DO  ferrous sulfate (FEROSUL) 325 (65 FE) MG tablet Take 1 tablet (325 mg total) by mouth 2 (two) times daily with a meal. TAKE 2 TABLETS(650 MG) BY MOUTH EVERY OTHER DAY Strength: 325 (65 Fe) mg 06/14/21   Zannie Cove, MD  gabapentin (NEURONTIN) 100 MG capsule Take 1 capsule (100 mg total) by mouth at bedtime. 04/13/23   Nche, Bonna Gains, NP  lactose free nutrition (BOOST) LIQD Take 237 mLs by mouth daily.    [provider]  loratadine (CLARITIN) 10 MG tablet Take 1 tablet (10 mg total) by mouth daily. 12/02/22    Linwood Dibbles, MD  Menthol, Topical Analgesic, (BIOFREEZE) 4 % GEL Apply 1 application topically 2 (two) times daily.    [provider]  Multiple Vitamin (MULTIVITAMIN WITH MINERALS) TABS tablet Take 1 tablet by mouth daily. 02/18/19   Sheikh, [name], DO  pantoprazole (PROTONIX) 40 MG tablet Take 1 tablet (40 mg total) by mouth 2 (two) times daily. 05/14/23 06/13/23  Briant Cedar, MD  polyethylene glycol (MIRALAX / GLYCOLAX) 17 g packet Take 17 g by mouth daily. 05/11/23   Rolly Salter, MD  Potassium 99 MG TABS Take 99 mg by mouth every morning.    [provider]      Allergies    Nsaids    Review of Systems   Review of Systems  Neurological:  Positive for headaches.    Physical Exam Updated Vital Signs BP 136/84 (BP Location: Left Arm)   Pulse 91   Temp 98.6 F (37 C) (Oral)   Resp 20   Ht 4\' 8"  (1.422 m)   Wt 59.7 kg   SpO2 100%   BMI 29.51 kg/m  Physical Exam Vitals and nursing note reviewed.  Constitutional:      General: She is not in acute distress.    Appearance: She is well-developed. She is not diaphoretic.  HENT:     Head: Normocephalic and atraumatic.  Eyes:     Pupils:  Pupils are equal, round, and reactive to light.  Cardiovascular:     Rate and Rhythm: Normal rate and regular rhythm.     Heart sounds: No murmur heard.    No friction rub. No gallop.  Pulmonary:     Effort: Pulmonary effort is normal.     Breath sounds: No wheezing or rales.  Abdominal:     General: There is no distension.     Palpations: Abdomen is soft.     Tenderness: There is no abdominal tenderness.  Musculoskeletal:        General: No tenderness.     Cervical back: Normal range of motion and neck supple.  Skin:    General: Skin is warm and dry.  Neurological:     Mental Status: She is alert and oriented to person, place, and time.  Psychiatric:        Behavior: Behavior normal.     ED Results / Procedures / Treatments   Labs (all labs ordered  are listed, but only abnormal results are displayed) Labs Reviewed  BASIC METABOLIC PANEL - Abnormal; Notable for the following components:      Result Value   Glucose, Bld 108 (*)    All other components within normal limits  CBC - Abnormal; Notable for the following components:   RBC 3.62 (*)    Hemoglobin 9.4 (*)    HCT 27.9 (*)    MCV 77.1 (*)    All other components within normal limits  URINALYSIS, ROUTINE W REFLEX MICROSCOPIC  CBG MONITORING, ED    EKG EKG Interpretation Date/Time:  Friday May 22 2023 12:14:50 EST Ventricular Rate:  92 PR Interval:  158 QRS Duration:  101 QT Interval:  386 QTC Calculation: 448 R Axis:   0  Text Interpretation: Sinus rhythm Multiform ventricular premature complexes Low voltage, precordial leads Consider anterior infarct No significant change since last tracing Confirmed by Melene Plan [phone]) on 05/22/2023 3:01:00 PM  Radiology No results found.  Procedures Procedures    Medications Ordered in ED Medications - No data to display  ED Course/ Medical Decision Making/ A&P                                 Medical Decision Making Amount and/or Complexity of Data Reviewed Labs: ordered.   87 yo F with a chief complaints of headache and frequent belching.  She said that she feels better by the time I saw her and she would like to go home.  I did offer further testing and she is declining.  She plans to follow-up as an outpatient.  I encouraged her to return anytime if she wished or if her symptoms changed.  She had a laboratory evaluation, hemoglobin above her baseline, no significant electrolyte abnormalities.   On record review the patient was just hospitalized for GI bleeding.  She denies any current dark stool or blood in her stool.  Denies abdominal pain.  3:23 PM:  I have discussed the diagnosis/risks/treatment options with the patient.  Evaluation and diagnostic testing in the emergency department does not suggest an  emergent condition requiring admission or immediate intervention beyond what has been performed at this time.  They will follow up with PCP. We also discussed returning to the ED immediately if new or worsening sx occur. We discussed the sx which are most concerning (e.g., sudden worsening pain, fever, inability to tolerate by mouth) that necessitate immediate  return. Medications administered to the patient during their visit and any new prescriptions provided to the patient are listed below.  Medications given during this visit Medications - No data to display   The patient appears reasonably screen and/or stabilized for discharge and I doubt any other medical condition or other [facility] requiring further screening, evaluation, or treatment in the ED at this time prior to discharge.          Final Clinical Impression(s) / ED Diagnoses Final diagnoses:  Belching  Nonintractable headache, unspecified chronicity pattern, unspecified headache type    Rx / DC Orders ED Discharge Orders     None         Melene Plan, DO 05/22/23 1523

## 2023-05-22 NOTE — Discharge Instructions (Signed)
Please call the family doctor in the office.  Please return at any moment you would like to be reassessed.

## 2023-05-22 NOTE — Transitions of Care (Post Inpatient/ED Visit) (Signed)
   05/22/2023  Name: Latoya Bennett MRN: [id number] DOB: 1936/06/18  Today's TOC FU Call Status: Today's TOC FU Call Status:: Successful TOC FU Call Completed TOC FU Call Complete Date: 05/22/23 Patient's Name and Date of Birth confirmed.  Transition Care Management Follow-up Telephone Call Date of Discharge: 05/14/23 Discharge Facility: [facility]) Type of Discharge: Inpatient Admission Primary Inpatient Discharge Diagnosis:: GI Bleed How have you been since you were released from the hospital?: Better Any questions or concerns?: Yes Patient Questions/Concerns:: Pt is unhappy with the care that she is receiving. She does not feel like she is being helped. Pt has recetly drove to [facility] by herself to receive care. [facility] stopped her BP medication and she is feeling dizzy, has a HA, and experiencing some pain to the shoulders and arms.  Items Reviewed: Do you have support at home?: No  Medications Reviewed Today: Medications Reviewed Today   Medications were not reviewed in this encounter     Home Care and Equipment/Supplies: Were Home Health Services Ordered?: NA Any new equipment or medical supplies ordered?: NA  Functional Questionnaire: Do you need assistance with bathing/showering or dressing?: No Do you need assistance with meal preparation?: No Do you need assistance with eating?: No Do you have difficulty maintaining continence: No Do you need assistance with getting out of bed/getting out of a chair/moving?: No Do you have difficulty managing or taking your medications?: No  Follow up appointments reviewed: Specialist Hospital Follow-up appointment confirmed?: NA Do you need transportation to your follow-up appointment?: Yes Transportation Need Intervention Addressed By:: Provider Office Notified Do you understand care options if your condition(s) worsen?: Yes-patient verbalized understanding  TOC/TCM call completed on this patient. Priority of the call shifted  to nurse triage. Concerned about symptoms the patient expresses having at the time of the call. See NT documentation.   SIGNATURE Arvil Persons, BSN, RN

## 2023-05-22 NOTE — ED Triage Notes (Signed)
Patient arrives in wheelchair by POV stating "I am back again. I was just sent home however I am still feeling terrible and having headaches." Reports dizziness when waking up in the mornings since leaving the hospital. Patient states she took her BP medicine this morning but says she is not supposed to be taking it. States she took it cause she cannot quit belching.

## 2023-05-22 NOTE — Telephone Encounter (Signed)
Called pt to complete TOC/TCM call. Pt expressed symptoms of dizziness, headache, and pain/discomfort to her arms and shoulders with movement. Pt reports her BP medication being discontinued during her recent DC from [facility]. She was Dc'd on 05/14/23. Pt connected with NT to be triaged for symptoms.

## 2023-05-25 ENCOUNTER — Telehealth: Payer: Self-pay

## 2023-05-25 NOTE — Telephone Encounter (Signed)
Upon completion of TOC call, pt states that the hospital called her on Friday and told her to come there. The call was actually myself, completing a previous TOC call, connecting her with nurse triage, and NT advising the pt to go to the ED per reported symptoms - dizziness, headache, arm and shoulder pain, chest discomfort, and recent discontinuation of BP med at last ED visit.   Pt states she still does not know what is going on with her, the ED did not tell her anything, they took blood, and ran tests, and she told them she wanted to leave. Pt states no one is helping her, she is spoiled, she is used to asking for things and getting it and she guesses that is her problem. She states we do not care about her and that she has too many ailments. She would rather drive to [facility] to receive care where they actually care about her and she can get help. Pt states "remove me as a patient, you can mark me as good, or bad, or whatever, because I feel the same about you all." Pt stated " thank you, you have a good day and disconnected the call."

## 2023-05-25 NOTE — Transitions of Care (Post Inpatient/ED Visit) (Unsigned)
   05/25/2023  Name: Latoya Bennett MRN: [id number] DOB: [birth year]  Today's TOC FU Call Status: Today's TOC FU Call Status:: Successful TOC FU Call Completed TOC FU Call Complete Date: 05/25/23 Patient's Name and Date of Birth confirmed.  Transition Care Management Follow-up Telephone Call Date of Discharge: 05/22/23 Discharge Facility: [facility]) Type of Discharge: Emergency Department Primary Inpatient Discharge Diagnosis:: Belching How have you been since you were released from the hospital?: Better Any questions or concerns?: No Patient Questions/Concerns:: Pt states that she is not happy with the care she has received with [facility] and requests that we cancel her as a patient. She woulod rather drive to [facility] to receive care and actually get help. Pt disconnected the call.  Items Reviewed: Medications obtained,verified, and reconciled?: No Any new allergies since your discharge?: No Dietary orders reviewed?: NA Do you have support at home?: No  Medications Reviewed Today: Medications Reviewed Today   Medications were not reviewed in this encounter     Home Care and Equipment/Supplies: Were Home Health Services Ordered?: NA Any new equipment or medical supplies ordered?: NA  Functional Questionnaire: Do you need assistance with bathing/showering or dressing?: No Do you need assistance with meal preparation?: No Do you need assistance with eating?: No Do you have difficulty maintaining continence: No Do you need assistance with getting out of bed/getting out of a chair/moving?: No Do you have difficulty managing or taking your medications?: No  Follow up appointments reviewed: PCP Follow-up appointment confirmed?: NA Specialist Hospital Follow-up appointment confirmed?: NA Do you need transportation to your follow-up appointment?: No Do you understand care options if your condition(s) worsen?: Yes-patient verbalized understanding  No hosp fu appt scheduled at  this time. Pt requested that she be removed from our office as a patient.  SIGNATURE Arvil Persons, BSN, RN

## 2023-05-25 NOTE — Telephone Encounter (Signed)
Provider aware

## 2023-07-24 ENCOUNTER — Telehealth: Payer: Self-pay | Admitting: Internal Medicine

## 2023-07-24 NOTE — Telephone Encounter (Signed)
Patient is asking for referral for MRI because of pain all over her body and joint pain she would also like to know if she qualifies for Prolia injections.  Patient stated she discussed this during other office visits.   please advise

## 2023-07-24 NOTE — Telephone Encounter (Signed)
Copied from CRM [id number]. Topic: Referral - Question >> Jul 24, 2023 11:07 AM Deaijah H wrote: Reason for CRM: Patient called in wanting to speak with Dr. Izola Price regarding a referral for an MRI and for her prolia injection / please call [phone]

## 2023-07-27 NOTE — Telephone Encounter (Signed)
The MRI and qualifying for prolia injection would be up to her orthopedics to decide. She needs to contact them.

## 2023-07-28 ENCOUNTER — Other Ambulatory Visit: Payer: Self-pay

## 2023-07-28 DIAGNOSIS — D500 Iron deficiency anemia secondary to blood loss (chronic): Secondary | ICD-10-CM

## 2023-07-28 NOTE — Telephone Encounter (Signed)
Patient is requesting a refill ferrous sulfate (FEROSUL) 325 (65 FE) MG tablet  Patient was informed message would be sent to PCP and a call will be returned informing her of Rx

## 2023-07-28 NOTE — Telephone Encounter (Signed)
Patient informed of message  

## 2023-07-29 MED ORDER — FERROUS SULFATE 325 (65 FE) MG PO TABS
325.0000 mg | ORAL_TABLET | Freq: Two times a day (BID) | ORAL | 1 refills | Status: AC
Start: 2023-07-29 — End: ?

## 2023-07-29 NOTE — Telephone Encounter (Signed)
Spoke with patient and she stated she is taking 1 tablets two times daily and 2 tablets every other day

## 2023-07-29 NOTE — Telephone Encounter (Signed)
Can you find out if patient takes one tablet once a day, or is it 2 tablets every other day? The prescription has both on it and I need clarification before sending.

## 2023-08-14 ENCOUNTER — Ambulatory Visit: Payer: Self-pay | Admitting: Internal Medicine
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
## 2023-09-08 ENCOUNTER — Telehealth: Payer: Self-pay | Admitting: Internal Medicine

## 2023-09-08 DIAGNOSIS — Z789 Other specified health status: Secondary | ICD-10-CM

## 2023-09-08 NOTE — Telephone Encounter (Signed)
 In the CRM/E2C2 pt is waiting on a referral for a home aid

## 2023-09-08 NOTE — Telephone Encounter (Addendum)
 Forwarding message below. Pt is requesting Home Aid referral. Follow OV may be needed LOV 04/13/2023 with Alysia Penna NP. Please advise.

## 2023-09-08 NOTE — Telephone Encounter (Signed)
 error

## 2023-09-08 NOTE — Telephone Encounter (Signed)
 Office visit is needed. I have not seen patient since her initial visit in June 2024

## 2023-09-09 NOTE — Telephone Encounter (Signed)
 Spoke to Pt and relied message below. Pt was very upset and is considering transferring care to [place]. "Pt stated no one is willing to help and assist with her health; and is unable to come to the clinic due to recurrent hospital and urgent care visit's". She is experiencing mobility, pain, and dizziness complications and is in need of home aid assistance. "She did state she is feeling "discrimination" with Gilmer Mor NP  and all she does is talk, talk, talk with no action or effort in helping her patient's". "Pt stated she mentioned this on her last OV with home aid referral and was told it would be done". I did offer an apology to Pt and did advise that we do not promote or tolerate discrimination at this organization and I do understand her concerns and frustration. I did let patient know that I will speak with supervisor and Salvatore Decent NP to see if it is possible to accomodation. Patient verbalized understanding and a thank you for help and trying. She would like a updated phone call once decision is made. Please advise.

## 2023-09-10 NOTE — Telephone Encounter (Addendum)
 36 minutes and 58 seconds was spent on the telephone conversation with this patient.  Introduced myself and explained the reason for my call today: To receive more information from her and have her explain to me the event(s) that have lead her to believe she has been discriminated against by her PCP, [place] Decent, [place]. Patient states,"I've only met her once. Why can't I get an MRI?" Pt started becoming tearful, stating, "I appreciate you calling but I'm tired. I'm paying my insurance and I can't get what makes me content. I've been to Neurology, why go back to Primary Care? Every time I come, it is just conversation. I'm going to drive to [place] to be seen, that is where I've been going. I've never had a doctor treat me like "Latoya [place]." I've called and told her I wanted an MRI." I explained to the pt that she would need an appt with Latoya Bennett, as she has not been seen by her since establishing care in 12/2022. Pt agreed to schedule an appt, then changed her mind and declined and states she will go see her provider in [place] where she knows she can get and MRI. Pt states, "I'm not stupid, I know an MRI will show something." Pt starts talking about her hospital experience. I explained that there really is not anything I can do in regard to the experience at the hospital and I would be happy to provider her with the number to pt experience. Reminded her that I was calling to gain an understanding of what has taken place for her to feel discriminated against by the PCP in our office. Pt starts talking about her diverticulitis and how she googled and read that she should have never received a steroid injection.. Pt starts talking about carpel tunnel. Pt starts crying.   When asked again what happened that makes you feel discriminated against, pt states, "Because I called and you all called me and I told you all what I wanted and I have to talk to this person and that person and you tell me to make an appt, why go back to a  place you are put out of?" I stated, "Latoya Bennett, we have not put you out, we would never put you out." Pt stated, "I know, I'm just using that as an example."Pt goes on to state, "it's something with me and women. I've had it happen to me before. Pt states her friend sees a provider in our office and is very satisfied with the care, and she is willing to wait until he has an opening. She states that she doesn't have the card with her with his name on it. I share that I believe Latoya Bennett might be a good fit for her and offered to look at his schedule with her for next available to transfer care.  I offered a TOC appt with Latoya Bennett. Because Latoya Bennett only sees one new patient/TOC per session and needs for patients of her age, 10/21/23 was the soonest I could get her scheduled. Pt states "oh no, that the soonest I can get in? [place] go [place], I can get a quicker appt." I explained the reason why this was the earliest. Pt states, "I better go ahead and make the appt. Pt scheduled with Latoya Bennett 10/21/23 at 1:20. Advised pt to arrive prior for check in. Provided telephone number. Offered address, pt states she knows where the office is. Pt states, "I might  need  to schedule an appointment with Latoya Bennett before this, I don't know what to do. She might have an attitude with me and I might have one with her too." Pt decides to wait to see Latoya Bennett. Advised to call our office if she needs anything prior to then.  [id number] placed for this pt.

## 2023-09-10 NOTE — Addendum Note (Signed)
 Addended by: Larey Dresser on: 09/10/2023 01:17 PM   Modules accepted: Orders

## 2023-09-11 ENCOUNTER — Telehealth: Payer: Self-pay | Admitting: *Deleted

## 2023-09-11 NOTE — Progress Notes (Signed)
 Complex Care Management Note  Care Guide Note 09/11/2023 Name: Latoya Bennett MRN: [id number] DOB: 10-Sep-1935  Latoya Bennett is a 88 y.o. year old female who sees No primary care provider on file. for primary care. I reached out to [facility] by phone today to offer complex care management services.  Ms. Koelzer was given information about Complex Care Management services today including:   The Complex Care Management services include support from the care team which includes your Nurse Care Manager, Clinical Social Worker, or Pharmacist.  The Complex Care Management team is here to help remove barriers to the health concerns and goals most important to you. Complex Care Management services are voluntary, and the patient may decline or stop services at any time by request to their care team member.   Complex Care Management Consent Status: Patient agreed to services and verbal consent obtained.   Follow up plan:  Telephone appointment with complex care management team member scheduled for:  with SW 3/11 at this time patient did not want to schedule with RN or PharmD due to excessive spam calls. Routed message to Veto Kemps through [id number].   Encounter Outcome:  Patient Scheduled  Gwenevere Ghazi  [facility]  [facility], [facility] Guide  Direct Dial: [phone]  Fax [phone]

## 2023-09-15 ENCOUNTER — Ambulatory Visit: Payer: Self-pay | Admitting: Licensed Clinical Social Worker

## 2023-09-15 ENCOUNTER — Other Ambulatory Visit: Payer: Self-pay

## 2023-09-15 NOTE — Patient Instructions (Signed)
 Visit Information  Thank you for taking time to visit with me today. Please don't hesitate to contact me if I can be of assistance to you.   Following are the goals we discussed today:   Goals Addressed   None        Please call the care guide team at [phone] if you need to cancel or reschedule your appointment.   If you are experiencing a Mental Health or Behavioral Health Crisis or need someone to talk to, please call 911   Patient verbalizes understanding of instructions and care plan provided today and agrees to view in MyChart. Active MyChart status and patient understanding of how to access instructions and care plan via MyChart confirmed with patient.     The patient has been provided with contact information for the care management team and has been advised to call with any health related questions or concerns.   Gwyndolyn Saxon MSW, LCSW Licensed Clinical Social Worker  [facility], Population Health Direct Dial: [phone]  Fax: [phone]

## 2023-09-15 NOTE — Patient Outreach (Signed)
 Care Coordination   Initial Visit Note   09/15/2023 Name: Latoya Bennett MRN: [id number] DOB: 19-Jan-1936  Latoya Bennett is a 88 y.o. year old female who sees Patient, No Pcp Per for primary care. I spoke with  [facility] by phone today.  What matters to the patients health and wellness today?  LCSW A. Felton Clinton completed initial phone visit w/ Ms. Latoya Bennett. Ms. Latoya Bennett reports she is displeased with regarding inability to receive a MRI for generalized pain. Ms. Latoya Bennett reports joint pain and allegedly reported her concerns to her pcp. Ms. Latoya Bennett reports she will return to [place] to speak w/ pcp there. Ms. Latoya Bennett denies negative thoughts, depressive symptoms and thoughts of harm. Ms. Latoya Bennett spoke clearly via phone; no social work needs were identified via phone. Ms. Latoya Bennett reports her home is safe, electricity, meals on wheels and no emergent safety concerns.    Goals Addressed   None     SDOH assessments and interventions completed:  Yes  SDOH Interventions Today    Flowsheet Row Most Recent Value  SDOH Interventions   Food Insecurity Interventions Intervention Not Indicated  Housing Interventions Intervention Not Indicated  Transportation Interventions Intervention Not Indicated        Care Coordination Interventions:  Yes, provided  Interventions Today    Flowsheet Row Most Recent Value  Chronic Disease   Chronic disease during today's visit Other  [Ms. Latoya Bennett reports being dissatified with the level of care she received from her pcp. Ms. Latoya Bennett is requesting a MRI to figure out the source of her generalized pain. Ms. Latoya Bennett reports she intends to go to [place] to receive care.]  Mental Health Interventions   Mental Health Discussed/Reviewed Mental Health Reviewed  [Ms. Latoya Bennett denied depressive symptoms, negative thought patterns and thoughts of self harm.]       Follow up plan: No further  intervention required.   Encounter Outcome:  Patient Visit Completed   Gwyndolyn Saxon MSW, LCSW Licensed Clinical Social Worker  [facility], Population Health Direct Dial: [phone]  Fax: [phone]

## 2023-09-15 NOTE — Patient Instructions (Signed)
 Visit Information  Thank you for taking time to visit with me today. Please don't hesitate to contact me if I can be of assistance to you.   Following are the goals we discussed today:  Continue to take medications as prescribed. Continue to attend provider visits as scheduled Contact provider with health questions or concerns as needed Continue to check blood pressure routinely and contact provider if questions or concerns  Our next appointment is by telephone on 09/05/2023 at 1:00 pm  Please call the care guide team at [phone] if you need to cancel or reschedule your appointment.   If you are experiencing a Mental Health or Behavioral Health Crisis or need someone to talk to, please call the Suicide and Crisis Lifeline: 988 call the [place] National Suicide Prevention Lifeline: 813 012 2818 or TTY: 915-629-5382 TTY 620-414-4692) to talk to a trained counselor   Ruel Favors BSN RN CCM [place]  [facility], [facility] Health RN Care Manager Direct Dial: [phone] Fax: [phone]

## 2023-09-15 NOTE — Patient Outreach (Addendum)
 Care Coordination   Initial Visit Note   09/15/2023 Name: Latoya Bennett MRN: [id number] DOB: 05/26/1936  Latoya Bennett is a 88 y.o. year old female who sees Patient, No Pcp Per for primary care. I spoke with  [name] by phone today.  What matters to the patients health and wellness today? Initial telephone visit patient to discuss care coordination, patient verbalized concerns with having medical treatment, she wants an full body MRI due to her pain, in hands, shoulders, legs and feet.  Reports leg swelling which restricts her mobility.  Patient wishes to have interventions explained to her satisfaction, frequently feels things are being "done to her" and she doesn't know why.  Patient has had referrals to orthopedic and neurology specialists, but reports "no one has done anything but take xrays".  Patient agreed to [name] enrollment, will attend appointment to establish care with new PCP, and discuss further treatment concerns at that time.    Goals Addressed             This Visit's Progress    health management       Interventions Today    Flowsheet Row Most Recent Value  Chronic Disease   Chronic disease during today's visit Hypertension (HTN), Other  [Arthritis and Diverticulitis]  General Interventions   General Interventions Discussed/Reviewed General Interventions Discussed, Doctor Visits  [Evaluation of current treatment plan for health condition and patient's adherence to plan.]  Doctor Visits Discussed/Reviewed Doctor Visits Discussed, PCP, Specialist  PCP/Specialist Visits Compliance with follow-up visit  [reviewed upcoming appt with new PCP Dr. Janee Morn on 4/21 at 1:20, confirmed transportation available.]  Exercise Interventions   Exercise Discussed/Reviewed Physical Activity, Assistive device use and maintanence  Physical Activity Discussed/Reviewed Physical Activity Discussed  Education Interventions   Education Provided Provided Education   Provided Verbal Education On Medication, When to see the doctor, Mental Health/Coping with Illness  Mental Health Interventions   Mental Health Discussed/Reviewed Mental Health Discussed  [discussed patient mental health status,patient endorses depression due to  lack of friendships, socialization, patient declined social work referral or resources.]  Nutrition Interventions   Nutrition Discussed/Reviewed Nutrition Discussed, Supplemental nutrition  [patient confirms taking daily multivitamin and liquid protein supplement]  Pharmacy Interventions   Pharmacy Dicussed/Reviewed Pharmacy Topics Discussed, Medications and their functions, Medication Adherence  [medications reviewed, encouraged patient to call pharmacy regarding administration of Prolia]  Safety Interventions   Safety Discussed/Reviewed Safety Discussed, Fall Risk  [discussed fall prevention strategies, encouraged patient to use assistive devices in home]            SDOH assessments and interventions completed:  Yes. SDOH completed recently by Child psychotherapist  Care Coordination Interventions:  Yes, provided   Follow up plan: Follow up call scheduled for 09/22/2023 at 1:00 pm    Encounter Outcome:  Patient Visit Completed    Ruel Favors BSN RN CCM [place]  [facility], [facility] Health RN Care Manager Direct Dial: [phone] Fax: [phone]

## 2023-09-22 ENCOUNTER — Ambulatory Visit: Payer: Self-pay | Admitting: General Practice

## 2023-09-22 ENCOUNTER — Telehealth: Payer: Self-pay

## 2023-09-22 ENCOUNTER — Other Ambulatory Visit: Payer: Self-pay

## 2023-09-22 NOTE — Telephone Encounter (Signed)
 3rd attempt, LVM requesting pt return call to office.

## 2023-09-22 NOTE — Telephone Encounter (Signed)
 Patient called, left VM to return the call to the office to speak to NT.    Copied from CRM [id number]. Topic: Clinical - Red Word Triage >> Sep 22, 2023  1:48 PM Elizebeth Brooking wrote: [facility] that prompted transfer to Nurse Triage: Misty Stanley, Nurse Case Manger called in regarding patient stated she had some swelling in legs and is experiencing dizziness and headaches. Blood pressure was 126/80. Would like for someone to give the patient a callback on this   Andee Lineman line is [phone]

## 2023-09-22 NOTE — Patient Outreach (Signed)
 Care Coordination   Follow Up Visit Note   09/23/2023 Name: Latoya Bennett MRN: [id number] DOB: 10-09-1935  [name] is a 88 y.o. year old female who sees Patient, No Pcp Per for primary care. I spoke with  [facility] by phone today.  What matters to the patients health and wellness today?  Lower extremity pain and swelling, patient questioning whether or not she should continue to take amlodipine, 10 mg, once daily.  Patient reports she only has 2 pills remaining of this prescription.  RNCM will reach out to patient tomorrow to review outcome of triage call, and to schedule for additional CCM appointments.   Goals Addressed             This Visit's Progress    health management       Interventions Today    Flowsheet Row Most Recent Value  Chronic Disease   Chronic disease during today's visit Hypertension (HTN)  General Interventions   General Interventions Discussed/Reviewed General Interventions Reviewed, Communication with, Doctor Visits  Doctor Visits Discussed/Reviewed Doctor Visits Reviewed, PCP  PCP/Specialist Visits Compliance with follow-up visit  Communication with PCP/Specialists  [called PCP office spoke to [place], requested triage RN to please call patient to discuss LE swelling and BP meds]            SDOH assessments and interventions completed:  Yes  SDOH Interventions Today    Flowsheet Row Most Recent Value  SDOH Interventions   Food Insecurity Interventions Intervention Not Indicated  Housing Interventions Intervention Not Indicated  Transportation Interventions Intervention Not Indicated  Utilities Interventions Intervention Not Indicated  Physical Activity Interventions Patient Declined  Social Connections Interventions Intervention Not Indicated  Health Literacy Interventions Intervention Not Indicated     Care Coordination Interventions:  Yes, provided   Follow up plan:  RNCM contacted triage RN at PCP office to  please call patient to discuss concerns for lower extremity swelling and BP medication refill.  Triage RN to determine if patient can be seen sooner than PCP appointment scheduled for 10/26/23    Encounter Outcome:  Patient Visit Completed   Ruel Favors BSN RN CCM [place]  [facility], [facility] Health RN Care Manager Direct Dial: [phone] Fax: [phone]

## 2023-09-22 NOTE — Telephone Encounter (Signed)
 Patient called, left VM to return the call to the office to speak to NT.

## 2023-09-22 NOTE — Telephone Encounter (Signed)
 Copied from CRM [id number]. Topic: Clinical - Red Word Triage >> Sep 22, 2023  1:48 PM Elizebeth Brooking wrote: [facility] that prompted transfer to Nurse Triage: Misty Stanley, Nurse Case Manger called in regarding patient stated she had some swelling in legs and is experiencing dizziness and headaches. Blood pressure was 126/80. Would like for someone to give the patient a callback on this   Andee Lineman line is [phone]   Chief Complaint: Edema  Symptoms: Swelling of legs and hands  Frequency: Constant  Disposition: [] ED /[x] Urgent Care (no appt availability in office) / [] Appointment(In office/virtual)/ []  [place] Virtual [facility]/ [] Home Care/ [] Refused Recommended Disposition /[] [place] Mobile [facility]/ []  Follow-up with PCP Additional Notes: Patient called to report that she has been experiencing swelling of her legs but was unable to tell me for how long, stating "since I had diverticulitis." She states that the swelling is now in her hands and arm as well. Patient has a transfer of care appointment on 10/26/23 and wanted to know if she could be seen earlier. CAL called who informed me that there are no earlier appointments available at this time. Patient advised to go to urgent care or the ED for her current symptoms. Patient verbalized understanding and agreement.     Reason for Disposition  Swelling of face, arm or hands  (Exception: Slight puffiness of fingers occurring during hot weather.)  Answer Assessment - Initial Assessment Questions 1. ONSET: "When did the swelling start?" (e.g., minutes, hours, days)     Since she had diverticulitis  2. LOCATION: "What part of the leg is swollen?"  "Are both legs swollen or just one leg?"     Diffuse swelling 3. SEVERITY: "How bad is the swelling?" (e.g., localized; mild, moderate, severe)   - Localized: Small area of swelling localized to one leg.   - MILD pedal edema: Swelling limited to foot and ankle, pitting edema < 1/4 inch (6 mm) deep, rest and elevation  eliminate most or all swelling.   - MODERATE edema: Swelling of lower leg to knee, pitting edema > 1/4 inch (6 mm) deep, rest and elevation only partially reduce swelling.   - SEVERE edema: Swelling extends above knee, facial or hand swelling present.      Severe 4. REDNESS: "Does the swelling look red or infected?"     No 5. PAIN: "Is the swelling painful to touch?" If Yes, ask: "How painful is it?"   (Scale 1-10; mild, moderate or severe)     Intermittent moderate pain in hips  6. FEVER: "Do you have a fever?" If Yes, ask: "What is it, how was it measured, and when did it start?"      No 7. CAUSE: "What do you think is causing the leg swelling?"     Unsure  Protocols used: Leg Swelling and Edema-A-AH

## 2023-09-23 NOTE — Patient Outreach (Signed)
  Care Management   Follow Up Note   09/23/2023 Name: Latoya Bennett MRN: [id number] DOB: Jan 20, 1936   Referred by: Patient, No Pcp Per Reason for referral : No chief complaint on file.   An unsuccessful telephone outreach was attempted today. The patient was referred to the case management team for assistance with care management and care coordination.   Follow Up Plan:  RNCM requested that referral to Care Guide to schedule next follow up appointment with CCM RNCM   Ruel Favors BSN RN CCM [place]  [facility], [facility] Health RN Care Manager Direct Dial: [phone] Fax: [phone]

## 2023-09-23 NOTE — Patient Instructions (Signed)
 Visit Information  Thank you for taking time to visit with me today. Please don't hesitate to contact me if I can be of assistance to you.   Following are the goals we discussed today:   Someone from your providers office will call you to discuss plan to address the pain and swelling in your legs.   Seek medical attention if your condition worsens or does not improve.   Continue to take all medication as instructed.   The Care Guide will call you to schedule our next appointment.  Please call the care guide team at [phone] if you need to cancel or reschedule your appointment.   If you are experiencing a Mental Health or Behavioral Health Crisis or need someone to talk to, please call the Suicide and Crisis Lifeline: 988 call the [place] National Suicide Prevention Lifeline: 581-433-4957 or TTY: 202-104-7367 TTY 828 784 4937) to talk to a trained counselor call 1-800-273-TALK (toll free, 24 hour hotline)    Ruel Favors BSN RN CCM [place]  [facility], [place] Health RN Care Manager Direct Dial: [phone] Fax: [phone]

## 2023-09-28 ENCOUNTER — Telehealth: Payer: Self-pay | Admitting: *Deleted

## 2023-09-28 NOTE — Progress Notes (Signed)
 Complex Care Management Care Guide Note  09/28/2023 Name: Latoya Bennett MRN: [id number] DOB: 1935/11/03  Latoya Bennett is a 88 y.o. year old female who is a primary care patient of Patient, No Pcp Per and is actively engaged with the care management team. I reached out to [facility] by phone today to assist with re-scheduling  with the RN Case Manager Patient declines to reschedule call with RNCM.  Follow up plan: No further outreach attempts will be made at this time.   Gwenevere Ghazi  [facility] Health  [facility], [facility] Guide  Direct Dial: [phone]  Fax [phone]

## 2023-10-05 ENCOUNTER — Ambulatory Visit: Payer: Self-pay

## 2023-10-05 NOTE — Telephone Encounter (Signed)
 Chief Complaint: Swelling Symptoms: Arm/Leg Swelling, HA,  Frequency: November Pertinent Negatives: Patient denies  Disposition: [] ED /[] Urgent Care (no appt availability in office) / [x] Appointment(In office/virtual)/ []  [place] Virtual Care/ [] Home Care/ [] Refused Recommended Disposition /[] [place] Mobile Bus/ []  Follow-up with PCP Additional Notes:  Pt reports she was taken off her BP medications and she is having increased swelling, HA, generalized pain. Difficult triage as pt had trouble answering direct questions. OV scheduled tomorrow. Pt reports right sided CP, mild SOB.  Reason for Disposition  [1] MODERATE leg swelling (e.g., swelling extends up to knees) AND [2] new-onset or worsening  Answer Assessment - Initial Assessment Questions ONSET: "When did the swelling start?" (e.g., minutes, hours, days)     November 2024  LOCATION: "What part of the leg is swollen?"  "Are both legs swollen or just one leg?"     Knees to Ankle  SEVERITY: "How bad is the swelling?" (e.g., localized; mild, moderate, severe)   - Localized: Small area of swelling localized to one leg.   - MILD pedal edema: Swelling limited to foot and ankle, pitting edema < 1/4 inch (6 mm) deep, rest and elevation eliminate most or all swelling.   - MODERATE edema: Swelling of lower leg to knee, pitting edema > 1/4 inch (6 mm) deep, rest and elevation only partially reduce swelling.   - SEVERE edema: Swelling extends above knee, facial or hand swelling present.      Severe   PAIN: "Is the swelling painful to touch?" If Yes, ask: "How painful is it?"   (Scale 1-10; mild, moderate or severe)     Mod-Severe  CAUSE: "What do you think is causing the leg swelling?"     Unknown  RECURRENT SYMPTOM: "Have you had leg swelling before?" If Yes, ask: "When was the last time?" "What happened that time?"     Yes OTHER SYMPTOMS: "Do you have any other symptoms?" (e.g., chest pain, difficulty breathing)       CP,  SOB  Protocols used: Leg Swelling and Edema-A-AH

## 2023-10-05 NOTE — Telephone Encounter (Addendum)
 I advised patient's of Dr. Carollee Massed note. Patient became irate and stated "I will go to an ED , but it won't be [facility]'s ED. You can forget I ever existed.". I advised her that Dr. Janee Morn wants her to have the best care as soon as possible regarding her symptoms. She ended the call. Appointment canceled.

## 2023-10-06 ENCOUNTER — Ambulatory Visit: Admitting: Family Medicine

## 2023-10-21 ENCOUNTER — Encounter: Admitting: Family Medicine

## 2023-10-22 ENCOUNTER — Encounter (HOSPITAL_BASED_OUTPATIENT_CLINIC_OR_DEPARTMENT_OTHER): Payer: Self-pay

## 2023-10-22 ENCOUNTER — Emergency Department (HOSPITAL_BASED_OUTPATIENT_CLINIC_OR_DEPARTMENT_OTHER)

## 2023-10-22 ENCOUNTER — Other Ambulatory Visit: Payer: Self-pay

## 2023-10-22 ENCOUNTER — Emergency Department (HOSPITAL_BASED_OUTPATIENT_CLINIC_OR_DEPARTMENT_OTHER)
Admission: EM | Admit: 2023-10-22 | Discharge: 2023-10-22 | Disposition: A | Discharge status: Home / Self Care | Attending: Emergency Medicine | Admitting: Emergency Medicine

## 2023-10-22 DIAGNOSIS — R0609 Other forms of dyspnea: Secondary | ICD-10-CM | POA: Insufficient documentation

## 2023-10-22 DIAGNOSIS — I10 Essential (primary) hypertension: Secondary | ICD-10-CM | POA: Insufficient documentation

## 2023-10-22 DIAGNOSIS — I499 Cardiac arrhythmia, unspecified: Secondary | ICD-10-CM

## 2023-10-22 DIAGNOSIS — R009 Unspecified abnormalities of heart beat: Secondary | ICD-10-CM | POA: Diagnosis present

## 2023-10-22 DIAGNOSIS — R42 Dizziness and giddiness: Secondary | ICD-10-CM | POA: Insufficient documentation

## 2023-10-22 LAB — COMPREHENSIVE METABOLIC PANEL WITH GFR
ALT: 13 U/L (ref 0–44)
AST: 21 U/L (ref 15–41)
Albumin: 4.2 g/dL (ref 3.5–5.0)
Alkaline Phosphatase: 100 U/L (ref 38–126)
Anion gap: 8 (ref 5–15)
BUN: 17 mg/dL (ref 8–23)
CO2: 29 mmol/L (ref 22–32)
Calcium: 10.6 mg/dL — ABNORMAL HIGH (ref 8.9–10.3)
Chloride: 103 mmol/L (ref 98–111)
Creatinine, Ser: 0.76 mg/dL (ref 0.44–1.00)
GFR, Estimated: >60 mL/min (ref >60)
Glucose, Bld: 88 mg/dL (ref 70–99)
Potassium: 3.7 mmol/L (ref 3.5–5.1)
Sodium: 140 mmol/L (ref 135–145)
Total Bilirubin: 0.6 mg/dL (ref 0.0–1.2)
Total Protein: 6.9 g/dL (ref 6.5–8.1)

## 2023-10-22 LAB — CBC WITH DIFFERENTIAL/PLATELET
Abs Immature Granulocytes: 0 10*3/uL (ref 0.00–0.07)
Basophils Absolute: 0 10*3/uL (ref 0.0–0.1)
Basophils Relative: 0 %
Eosinophils Absolute: 0 10*3/uL (ref 0.0–0.5)
Eosinophils Relative: 1 %
HCT: 38.3 % (ref 36.0–46.0)
Hemoglobin: 13.4 g/dL (ref 12.0–15.0)
Immature Granulocytes: 0 %
Lymphocytes Relative: 48 %
Lymphs Abs: 1.9 10*3/uL (ref 0.7–4.0)
MCH: 24.9 pg — ABNORMAL LOW (ref 26.0–34.0)
MCHC: 35 g/dL (ref 30.0–36.0)
MCV: 71.2 fL — ABNORMAL LOW (ref 80.0–100.0)
Monocytes Absolute: 0.3 10*3/uL (ref 0.1–1.0)
Monocytes Relative: 7 %
Neutro Abs: 1.8 10*3/uL (ref 1.7–7.7)
Neutrophils Relative %: 44 %
Platelets: 148 10*3/uL — ABNORMAL LOW (ref 150–400)
RBC: 5.38 MIL/uL — ABNORMAL HIGH (ref 3.87–5.11)
RDW: 15.7 % — ABNORMAL HIGH (ref 11.5–15.5)
WBC: 4 10*3/uL (ref 4.0–10.5)
nRBC: 0 % (ref 0.0–0.2)

## 2023-10-22 LAB — BRAIN NATRIURETIC PEPTIDE: B Natriuretic Peptide: 176.5 pg/mL — ABNORMAL HIGH (ref 0.0–100.0)

## 2023-10-22 MED ORDER — AMLODIPINE BESYLATE 5 MG PO TABS: 5.0000 mg | ORAL_TABLET | Freq: Every day | ORAL | 0 refills | Status: AC

## 2023-10-22 NOTE — Discharge Instructions (Addendum)
 While you were in the emergency room, you had an EKG done that shows that your heartbeat is sometimes a little irregular.  I have sent a referral to our cardiology office.  They should be calling you within 1 week to set up a follow-up appointment.  Return to the emergency room if you develop any difficulty with breathing or lose consciousness.  Your blood work that was done today was normal.

## 2023-10-22 NOTE — ED Provider Notes (Signed)
 [place] EMERGENCY DEPARTMENT AT [facility] Provider Note   CSN: [id number] Arrival date & time: 10/22/23  0907     History  Chief Complaint  Patient presents with   Dizziness    Latoya Bennett is a 88 y.o. female.  This is a 88 year old female who is here today for primarily dyspnea on exertion.  Patient with a history of hypertension, irregular heartbeat.  She reports that over the last several weeks she has been feeling increasingly tired, has been unable to do things such as do her gardening that she enjoys doing in the spring.  She is not any chest pain, does not feel short of breath at rest.  Patient was admitted in November of this past year for GI bleed.   Dizziness      Home Medications Prior to Admission medications   Medication Sig Start Date End Date Taking? Authorizing Provider  denosumab (PROLIA) 60 MG/ML SOSY injection Inject 60 mg into the skin every 6 (six) months. Patient not taking: Reported on 09/15/2023 09/16/22   [provider]  diclofenac Sodium (VOLTAREN) 1 % GEL Apply 2 g topically 4 (four) times daily. Patient taking differently: Apply 2 g topically 2 (two) times daily as needed (pain). 04/30/20   Burnadette Pop, MD  feeding supplement, ENSURE ENLIVE, (ENSURE ENLIVE) LIQD Take 237 mLs by mouth 2 (two) times daily between meals. 02/17/19   [name], DO  ferrous sulfate (FEROSUL) 325 (65 FE) MG tablet Take 1 tablet (325 mg total) by mouth 2 (two) times daily with a meal. 07/29/23   Salvatore Decent, FNP  gabapentin (NEURONTIN) 100 MG capsule Take 1 capsule (100 mg total) by mouth at bedtime. 04/13/23   Nche, Bonna Gains, NP  Menthol, Topical Analgesic, (BIOFREEZE) 4 % GEL Apply 1 application topically 2 (two) times daily.    [provider]  Multiple Vitamin (MULTIVITAMIN WITH MINERALS) TABS tablet Take 1 tablet by mouth daily. 02/18/19   Sheikh, [name], DO  pantoprazole (PROTONIX) 40 MG tablet Take 1 tablet  (40 mg total) by mouth 2 (two) times daily. Patient not taking: Reported on 09/15/2023 05/14/23 09/15/23  Briant Cedar, MD  polyethylene glycol (MIRALAX / GLYCOLAX) 17 g packet Take 17 g by mouth daily. Patient not taking: Reported on 09/15/2023 05/11/23   Rolly Salter, MD  Potassium 99 MG TABS Take 99 mg by mouth every morning.    [provider]      Allergies    Nsaids    Review of Systems   Review of Systems  Neurological:  Positive for dizziness.    Physical Exam Updated Vital Signs BP (!) 194/85   Pulse 86   Temp (!) 97.5 F (36.4 C) (Oral)   Resp 17   Ht 4\' 8"  (1.422 m)   Wt 59 kg   SpO2 100%   BMI 29.15 kg/m  Physical Exam Vitals reviewed.  Cardiovascular:     Rate and Rhythm: Normal rate. Rhythm irregular.  Pulmonary:     Effort: Pulmonary effort is normal.  Skin:    General: Skin is warm.  Neurological:     General: No focal deficit present.     Mental Status: She is alert.     ED Results / Procedures / Treatments   Labs (all labs ordered are listed, but only abnormal results are displayed) Labs Reviewed  COMPREHENSIVE METABOLIC PANEL WITH GFR - Abnormal; Notable for the following components:      Result Value  Calcium 10.6 (*)    All other components within normal limits  CBC WITH DIFFERENTIAL/PLATELET - Abnormal; Notable for the following components:   RBC 5.38 (*)    MCV 71.2 (*)    MCH 24.9 (*)    RDW 15.7 (*)    Platelets 148 (*)    All other components within normal limits  CBC WITH DIFFERENTIAL/PLATELET  BRAIN NATRIURETIC PEPTIDE    EKG EKG Interpretation Date/Time:  Thursday October 22 2023 09:28:06 EDT Ventricular Rate:  94 PR Interval:  160 QRS Duration:  84 QT Interval:  363 QTC Calculation: 414 R Axis:   -36  Text Interpretation: Sinus rhythm Supraventricular bigeminy Abnormal R-wave progression, late transition Left ventricular hypertrophy Multifocal atrial tachycardia Confirmed by Afton Horse [phone]) on  10/22/2023 11:16:53 AM  Radiology DG Chest Portable 1 View Result Date: 10/22/2023 CLINICAL DATA:  cough and dizziness EXAM: PORTABLE CHEST 1 VIEW COMPARISON:  January 01, 2018 FINDINGS: No focal airspace consolidation, pleural effusion, or pneumothorax. No cardiomegaly. Tortuous aorta with aortic atherosclerosis. No acute fracture or destructive lesions. Multilevel thoracic osteophytosis. Bilateral glenohumeral joint osteoarthritis. IMPRESSION: No acute cardiopulmonary abnormality. Electronically Signed   By: Rance Burrows M.D.   On: 10/22/2023 11:21    Procedures Procedures    Medications Ordered in ED Medications - No data to display  ED Course/ Medical Decision Making/ A&P                                 Medical Decision Making 88 year old female is here today for primarily dyspnea on exertion.  Plan-patient's initial EKG does show some irregularities, PACs versus multifocal atrial activity.  In the room, patient is reading a heart rate of 90s, however her radial pulse is only transmitting about 60.  I think that this likely is contributing to some of the symptoms that she is experiencing.  I spoke with cardiology, Dr.Croiteau, who reviewed the patient's EKG, and recommended outpatient follow-up.  Patient's blood work has returned, shows a normal hemoglobin, normal electrolytes.  Patient has had elevated blood pressure here in the emergency room.  Will restart her on amlodipine that she been taking previously.  Will discharge patient with cardiology follow-up.  Amount and/or Complexity of Data Reviewed Labs: ordered. Radiology: ordered.           Final Clinical Impression(s) / ED Diagnoses Final diagnoses:  Irregular heart beat    Rx / DC Orders ED Discharge Orders          Ordered    Ambulatory referral to Cardiology       Comments: If you have not heard from the Cardiology office within the next 72 hours please call [phone].   10/22/23 1340               Afton Horse T, DO 10/22/23 1342

## 2023-10-22 NOTE — ED Triage Notes (Signed)
 Pt POV from home c/o generalized pain in multiple sites "all over", advises concern for "bone cancer". No hx of CA. "Everywhere these hands touch hurts", CP worse w/ ROM and breathing, denies SOB, N/V/D

## 2023-10-26 ENCOUNTER — Ambulatory Visit: Attending: Cardiovascular Disease | Admitting: Cardiovascular Disease

## 2023-10-26 ENCOUNTER — Encounter: Admitting: Family Medicine

## 2023-10-27 ENCOUNTER — Encounter: Payer: Self-pay | Admitting: Cardiovascular Disease

## 2023-12-02 ENCOUNTER — Other Ambulatory Visit: Payer: Self-pay | Admitting: Nurse Practitioner

## 2023-12-02 DIAGNOSIS — R2681 Unsteadiness on feet: Secondary | ICD-10-CM

## 2023-12-02 DIAGNOSIS — R6889 Other general symptoms and signs: Secondary | ICD-10-CM

## 2023-12-02 DIAGNOSIS — R42 Dizziness and giddiness: Secondary | ICD-10-CM

## 2023-12-10 ENCOUNTER — Other Ambulatory Visit

## 2023-12-18 ENCOUNTER — Telehealth (HOSPITAL_COMMUNITY): Payer: Self-pay

## 2023-12-18 NOTE — Telephone Encounter (Signed)
 Pt. Called an left a VM on Patient Relations Specialist..  The information was sent to me and I contacted the patient. What she needed is an RX for needles that was written for her 2 years ago by her PCP. We reviewed her PCP information and she has been calling them all day an leaving messages. Her PCP is from [facility].   I also informed her that  if is she is unable to contact her PCP and needs to see a doctor, she can return to the ED for an evaluation.  She verbalized understanding and thanked me .

## 2024-01-21 ENCOUNTER — Other Ambulatory Visit

## 2024-01-26 ENCOUNTER — Inpatient Hospital Stay
Admission: RE | Admit: 2024-01-26 | Discharge: 2024-01-26 | Discharge status: Home / Self Care | Source: Ambulatory Visit | Attending: Nurse Practitioner

## 2024-01-26 DIAGNOSIS — R2681 Unsteadiness on feet: Secondary | ICD-10-CM

## 2024-01-26 DIAGNOSIS — R6889 Other general symptoms and signs: Secondary | ICD-10-CM

## 2024-01-26 DIAGNOSIS — R42 Dizziness and giddiness: Secondary | ICD-10-CM
